# Patient Record
Sex: Female | Born: 1940 | Race: White | Hispanic: No | Smoking: Never smoker
Health system: Southern US, Community
[De-identification: ages and names within clinical notes are randomized; demographics above are authoritative.]

## PROBLEM LIST (undated history)

## (undated) DIAGNOSIS — R001 Bradycardia, unspecified: Secondary | ICD-10-CM

## (undated) DIAGNOSIS — E039 Hypothyroidism, unspecified: Secondary | ICD-10-CM

## (undated) DIAGNOSIS — E7800 Pure hypercholesterolemia, unspecified: Secondary | ICD-10-CM

## (undated) DIAGNOSIS — I639 Cerebral infarction, unspecified: Secondary | ICD-10-CM

## (undated) DIAGNOSIS — Z9889 Other specified postprocedural states: Secondary | ICD-10-CM

## (undated) DIAGNOSIS — K635 Polyp of colon: Secondary | ICD-10-CM

## (undated) DIAGNOSIS — M1990 Unspecified osteoarthritis, unspecified site: Secondary | ICD-10-CM

## (undated) DIAGNOSIS — N189 Chronic kidney disease, unspecified: Secondary | ICD-10-CM

## (undated) DIAGNOSIS — F32A Depression, unspecified: Secondary | ICD-10-CM

## (undated) DIAGNOSIS — I4891 Unspecified atrial fibrillation: Secondary | ICD-10-CM

## (undated) DIAGNOSIS — I10 Essential (primary) hypertension: Secondary | ICD-10-CM

## (undated) DIAGNOSIS — E119 Type 2 diabetes mellitus without complications: Secondary | ICD-10-CM

## (undated) DIAGNOSIS — F329 Major depressive disorder, single episode, unspecified: Secondary | ICD-10-CM

## (undated) DIAGNOSIS — K859 Acute pancreatitis without necrosis or infection, unspecified: Secondary | ICD-10-CM

## (undated) DIAGNOSIS — K219 Gastro-esophageal reflux disease without esophagitis: Secondary | ICD-10-CM

## (undated) HISTORY — DX: Acute pancreatitis without necrosis or infection, unspecified: K85.90

## (undated) HISTORY — DX: Cerebral infarction, unspecified: I63.9

## (undated) HISTORY — DX: Unspecified atrial fibrillation: I48.91

## (undated) HISTORY — DX: Pure hypercholesterolemia, unspecified: E78.00

## (undated) HISTORY — DX: Hypothyroidism, unspecified: E03.9

## (undated) HISTORY — DX: Essential (primary) hypertension: I10

## (undated) HISTORY — DX: Type 2 diabetes mellitus without complications: E11.9

## (undated) HISTORY — PX: SKIN CANCER EXCISION: SHX779

## (undated) HISTORY — PX: CHOLECYSTECTOMY: SHX55

## (undated) HISTORY — DX: Other specified postprocedural states: Z98.890

## (undated) HISTORY — DX: Gastro-esophageal reflux disease without esophagitis: K21.9

---

## 1999-10-01 ENCOUNTER — Encounter: Payer: Self-pay | Admitting: Family Medicine

## 1999-10-01 ENCOUNTER — Encounter: Admission: RE | Admit: 1999-10-01 | Discharge: 1999-10-01 | Payer: Self-pay | Admitting: Family Medicine

## 1999-10-07 ENCOUNTER — Encounter: Payer: Self-pay | Admitting: Family Medicine

## 1999-10-07 ENCOUNTER — Encounter: Admission: RE | Admit: 1999-10-07 | Discharge: 1999-10-07 | Payer: Self-pay | Admitting: Family Medicine

## 2000-02-12 ENCOUNTER — Encounter: Admission: RE | Admit: 2000-02-12 | Discharge: 2000-02-12 | Payer: Self-pay | Admitting: Family Medicine

## 2000-02-12 ENCOUNTER — Encounter: Payer: Self-pay | Admitting: Family Medicine

## 2004-07-16 ENCOUNTER — Inpatient Hospital Stay (HOSPITAL_COMMUNITY): Admission: EM | Admit: 2004-07-16 | Discharge: 2004-07-27 | Payer: Self-pay | Admitting: Emergency Medicine

## 2004-07-23 DIAGNOSIS — Z9889 Other specified postprocedural states: Secondary | ICD-10-CM

## 2004-07-23 HISTORY — DX: Other specified postprocedural states: Z98.890

## 2004-07-24 ENCOUNTER — Encounter (INDEPENDENT_AMBULATORY_CARE_PROVIDER_SITE_OTHER): Payer: Self-pay | Admitting: *Deleted

## 2004-07-24 ENCOUNTER — Ambulatory Visit: Payer: Self-pay | Admitting: Gastroenterology

## 2005-04-03 ENCOUNTER — Emergency Department (HOSPITAL_COMMUNITY): Admission: EM | Admit: 2005-04-03 | Discharge: 2005-04-03 | Payer: Self-pay | Admitting: Emergency Medicine

## 2006-05-25 HISTORY — PX: SHOULDER SURGERY: SHX246

## 2006-06-17 ENCOUNTER — Ambulatory Visit (HOSPITAL_COMMUNITY): Admission: RE | Admit: 2006-06-17 | Discharge: 2006-06-17 | Payer: Self-pay | Admitting: Family Medicine

## 2007-01-09 ENCOUNTER — Emergency Department (HOSPITAL_COMMUNITY): Admission: EM | Admit: 2007-01-09 | Discharge: 2007-01-09 | Payer: Self-pay | Admitting: Emergency Medicine

## 2007-04-25 DIAGNOSIS — I2510 Atherosclerotic heart disease of native coronary artery without angina pectoris: Secondary | ICD-10-CM | POA: Insufficient documentation

## 2007-04-25 DIAGNOSIS — E785 Hyperlipidemia, unspecified: Secondary | ICD-10-CM | POA: Insufficient documentation

## 2007-04-25 DIAGNOSIS — E039 Hypothyroidism, unspecified: Secondary | ICD-10-CM | POA: Insufficient documentation

## 2007-04-25 DIAGNOSIS — E119 Type 2 diabetes mellitus without complications: Secondary | ICD-10-CM | POA: Insufficient documentation

## 2007-04-25 DIAGNOSIS — F329 Major depressive disorder, single episode, unspecified: Secondary | ICD-10-CM

## 2007-04-25 DIAGNOSIS — I6789 Other cerebrovascular disease: Secondary | ICD-10-CM | POA: Insufficient documentation

## 2007-04-25 DIAGNOSIS — I4891 Unspecified atrial fibrillation: Secondary | ICD-10-CM | POA: Insufficient documentation

## 2007-04-25 DIAGNOSIS — I10 Essential (primary) hypertension: Secondary | ICD-10-CM | POA: Insufficient documentation

## 2007-04-25 DIAGNOSIS — F32A Depression, unspecified: Secondary | ICD-10-CM | POA: Insufficient documentation

## 2007-07-24 DIAGNOSIS — Z9889 Other specified postprocedural states: Secondary | ICD-10-CM

## 2007-07-24 HISTORY — DX: Other specified postprocedural states: Z98.890

## 2007-08-07 ENCOUNTER — Ambulatory Visit: Payer: Self-pay | Admitting: Cardiology

## 2007-08-07 ENCOUNTER — Inpatient Hospital Stay (HOSPITAL_COMMUNITY): Admission: EM | Admit: 2007-08-07 | Discharge: 2007-08-17 | Payer: Self-pay | Admitting: Emergency Medicine

## 2007-08-08 ENCOUNTER — Ambulatory Visit: Payer: Self-pay | Admitting: Internal Medicine

## 2007-08-09 ENCOUNTER — Ambulatory Visit: Payer: Self-pay | Admitting: Internal Medicine

## 2007-08-09 ENCOUNTER — Encounter (INDEPENDENT_AMBULATORY_CARE_PROVIDER_SITE_OTHER): Payer: Self-pay | Admitting: Internal Medicine

## 2007-08-10 ENCOUNTER — Ambulatory Visit: Payer: Self-pay | Admitting: Gastroenterology

## 2007-08-15 ENCOUNTER — Ambulatory Visit: Payer: Self-pay | Admitting: Gastroenterology

## 2007-08-15 ENCOUNTER — Encounter: Payer: Self-pay | Admitting: Gastroenterology

## 2007-08-16 ENCOUNTER — Ambulatory Visit: Payer: Self-pay | Admitting: Internal Medicine

## 2007-08-19 ENCOUNTER — Ambulatory Visit: Payer: Self-pay | Admitting: Cardiology

## 2007-08-26 ENCOUNTER — Ambulatory Visit: Payer: Self-pay | Admitting: Cardiology

## 2007-09-09 ENCOUNTER — Ambulatory Visit: Payer: Self-pay | Admitting: Cardiology

## 2007-10-10 ENCOUNTER — Ambulatory Visit: Payer: Self-pay | Admitting: Internal Medicine

## 2007-10-20 ENCOUNTER — Ambulatory Visit: Payer: Self-pay | Admitting: Internal Medicine

## 2007-10-25 ENCOUNTER — Ambulatory Visit: Payer: Self-pay | Admitting: Cardiology

## 2007-11-29 ENCOUNTER — Ambulatory Visit: Payer: Self-pay | Admitting: Gastroenterology

## 2008-01-25 ENCOUNTER — Ambulatory Visit (HOSPITAL_COMMUNITY): Admission: RE | Admit: 2008-01-25 | Discharge: 2008-01-25 | Payer: Self-pay | Admitting: Internal Medicine

## 2008-01-26 ENCOUNTER — Ambulatory Visit: Payer: Self-pay | Admitting: Cardiology

## 2008-09-07 ENCOUNTER — Ambulatory Visit (HOSPITAL_COMMUNITY): Admission: RE | Admit: 2008-09-07 | Discharge: 2008-09-07 | Payer: Self-pay | Admitting: Internal Medicine

## 2009-04-05 ENCOUNTER — Ambulatory Visit (HOSPITAL_COMMUNITY): Admission: RE | Admit: 2009-04-05 | Discharge: 2009-04-05 | Payer: Self-pay | Admitting: Internal Medicine

## 2009-08-15 ENCOUNTER — Encounter (INDEPENDENT_AMBULATORY_CARE_PROVIDER_SITE_OTHER): Payer: Self-pay

## 2010-04-15 ENCOUNTER — Ambulatory Visit (HOSPITAL_COMMUNITY): Admission: RE | Admit: 2010-04-15 | Discharge: 2010-04-15 | Payer: Self-pay | Admitting: Internal Medicine

## 2010-06-15 ENCOUNTER — Encounter: Payer: Self-pay | Admitting: Family Medicine

## 2010-06-26 NOTE — Letter (Signed)
Summary: Recall, Screening Colonoscopy Only  [facility]  [address]   [place], [place] [address]   Phone: [phone]  Fax: [phone]    August 15, 2009  FELICIDAD HOGELAND [address] [place], [place]  [address] 02/25/1941   Dear Ms. Dowse,   Our records indicate it is time to schedule your colonoscopy.    Please call our office at [phone] and ask for the nurse.   Thank you,  Burnadette Peter, LPN Waldon Merl, [facility] Ph: [phone]   Fax: [phone]

## 2010-08-21 ENCOUNTER — Emergency Department (HOSPITAL_COMMUNITY)
Admission: EM | Admit: 2010-08-21 | Discharge: 2010-08-22 | Disposition: A | Discharge status: Home / Self Care | Payer: MEDICARE | Attending: Emergency Medicine | Admitting: Emergency Medicine

## 2010-08-21 ENCOUNTER — Emergency Department (HOSPITAL_COMMUNITY): Payer: MEDICARE

## 2010-08-21 DIAGNOSIS — R05 Cough: Secondary | ICD-10-CM | POA: Insufficient documentation

## 2010-08-21 DIAGNOSIS — R509 Fever, unspecified: Secondary | ICD-10-CM | POA: Insufficient documentation

## 2010-08-21 DIAGNOSIS — B9789 Other viral agents as the cause of diseases classified elsewhere: Secondary | ICD-10-CM | POA: Insufficient documentation

## 2010-08-21 DIAGNOSIS — R059 Cough, unspecified: Secondary | ICD-10-CM | POA: Insufficient documentation

## 2010-08-21 DIAGNOSIS — IMO0001 Reserved for inherently not codable concepts without codable children: Secondary | ICD-10-CM | POA: Insufficient documentation

## 2010-08-22 LAB — DIFFERENTIAL
Basophils Absolute: 0 10*3/uL (ref 0.0–0.1)
Basophils Relative: 0 % (ref 0–1)
Lymphocytes Relative: 10 % — ABNORMAL LOW (ref 12–46)
Neutro Abs: 6.1 10*3/uL (ref 1.7–7.7)
Neutrophils Relative %: 80 % — ABNORMAL HIGH (ref 43–77)

## 2010-08-22 LAB — URINALYSIS, ROUTINE W REFLEX MICROSCOPIC
Bilirubin Urine: NEGATIVE
Glucose, UA: NEGATIVE mg/dL
Protein, ur: NEGATIVE mg/dL
Specific Gravity, Urine: 1.01 (ref 1.005–1.030)

## 2010-08-22 LAB — CBC
HCT: 36.1 % (ref 36.0–46.0)
Hemoglobin: 11.7 g/dL — ABNORMAL LOW (ref 12.0–15.0)
RBC: 3.83 MIL/uL — ABNORMAL LOW (ref 3.87–5.11)
WBC: 7.7 10*3/uL (ref 4.0–10.5)

## 2010-08-22 LAB — BASIC METABOLIC PANEL
CO2: 26 mEq/L (ref 19–32)
Calcium: 9.1 mg/dL (ref 8.4–10.5)
Chloride: 102 mEq/L (ref 96–112)
GFR calc Af Amer: 36 mL/min — ABNORMAL LOW (ref >60)
Potassium: 4 mEq/L (ref 3.5–5.1)
Sodium: 138 mEq/L (ref 135–145)

## 2010-08-22 LAB — URINE MICROSCOPIC-ADD ON

## 2010-10-07 NOTE — Consult Note (Signed)
[name], [name]               ACCOUNT NO.:  [id number]   MEDICAL RECORD NO.:  [id number]          PATIENT TYPE:  INP   LOCATION:  A218                          FACILITY:  [facility]   PHYSICIAN:  R. Garfield Cornea, M.D. DATE OF BIRTH:  1941-05-03   DATE OF CONSULTATION:  08/08/2007  DATE OF DISCHARGE:                                 CONSULTATION   REASON FOR CONSULTATION:  Acute pancreatitis.   HISTORY OF PRESENT ILLNESS:  [name] is a 70 year old female with a  second episode of acute pancreatitis.  She has a 10-day history of  severe upper abdominal pain which she describes as a band-like aching to  her upper abdomen.  She rates the pain as a 9/10 on the pain scale.  This pain is worsened every time she eats.  She is having vomiting, as  well, postprandially.  She was admitted to the hospital with a lipase of  4020.  She also had a white blood cell count of 13,700.  She denies any  history of jaundice, pruritus or fever.  She has had some anorexia.  Her  weight has remained stable.  She does note flu-like symptoms prior to  admission including abdominal pain, fatigue and cough with green sputum.  She has a history of hyperlipidemia and triglyceridemia.  Has been on  Zocor for about 2 years now.  She is noted to have acute renal  insufficiency with a creatinine of 2.06 today.   PAST MEDICAL AND SURGICAL HISTORY:  1. She was admitted to [facility] in 2006 with similar symptoms and      found to have acute pancreatitis.  No evidence of gallstones on      ultrasound.  2. Paroxysmal atrial fibrillation and has been on chronic Coumadin.  3. Type 2 diabetes mellitus.  4. Depression.  5. History of CVA with left-sided deficits in 2004.  6. Hypertension.  7. She had a cardiac catheterization in 1995 which she thinks was      normal.  She had left shoulder surgery in August of 2008.   MEDICATIONS PRIOR TO ADMISSION:  1. Prilosec 40 mg daily.  2. Lasix 40 mg daily.  3. Benazepril 40  grams daily.  4. Levothyroxine 75 mg mcg daily.  5. Glyburide 2.5 mg daily.  6. Metformin 500 mg b.i.d.  7. Celexa 20 mg daily.  8. Warfarin 5 mg daily.  9. Flecainide 100 mg b.i.d.   ALLERGIES:  1. FIORINAL.  2. PENICILLIN.  3. TETANUS.   FAMILY HISTORY:  Positive for a maternal uncle with pancreatic  carcinoma.  One son with diabetes.  One brother deceased secondary to  alcoholic cirrhosis.  Mother deceased at 27 secondary to lung cancer.  Father deceased at 7 secondary to MI.  She has two sisters, one with  rheumatoid arthritis, one with MI.   SOCIAL HISTORY:  [name] denies any tobacco, alcohol or drug use.  She is disabled.  She was previously a Secondary school teacher.  She is single.  She has one grown son.   REVIEW OF SYSTEMS:  See HPI; otherwise negative.  PHYSICAL EXAMINATION:  VITAL SIGNS:  Weight 97.5 pounds, height 57 kg,  temperature 98.4, pulse 65, respirations 20, blood pressure 90/35, O2  saturation 92% on 2 liters via nasal cannula.  GENERAL:  [name] is an obese Caucasian female who is alert,  oriented, pleasant and cooperative, in no acute distress.  HEENT:  Sclerae are clear, nonicteric.  Conjunctivae pink.  Oropharynx  pink and moist without any lesions.  NECK:  Supple without any mass or thyromegaly.  CHEST:  Heart regular rate and rhythm.  Normal S1-S2 without murmurs,  clicks, rubs or gallops.  LUNGS:  Clear to auscultation bilaterally.  ABDOMEN:  Positive bowel sounds x4.  Abdomen is mildly distended.  She  does have significant tenderness to her upper abdomen bilaterally.  She  does have rebound tenderness.  EXTREMITIES:  Without clubbing or edema bilaterally.   LABORATORY STUDIES:  Hemoglobin 12, hematocrit 35, WBCs 12.5, platelets  205.  PT 27.5, INR 2.4, total bilirubin 2.6, alkaline phosphatase 400,  AST 1558,  ALT 1183, total protein 6.7, albumin 3.4, amylase 126,  calcium 9, sodium 145, potassium 4.8, chloride 107, CO2 of 30, BUN 35,   creatinine 2.06 and glucose 85.   IMPRESSION:  [name] is a 70 year old female with 10 days of  abdominal pain and vomiting with elevated amylase and lipase and acute  pancreatitis.  This is [name]'s second episode.  Previous workup in  2006 was negative for gallstones.  She does have a history of  hypertriglyceridemia.  The etiology of her pancreatitis could include  gallstones versus hypertriglyceridemia versus malignancy.   PLAN:  1. NPO.  2. Abdominal ultrasound today.  3. Cannot have pancreatic protocol CT secondary to renal      insufficiency.  4. If no evidence of gallstones, will need EUS once the pancreas cools      off.  5. Await lipid panel.  6. Would hold antibiotics for now.  7. Agree with pain control and antiemetics.  8. LFTs and MET-7 in the morning.  9. May need TPN if she does not turn around quickly for nutrition.  10.Increase IV fluids to 175 cc per hour for 8 hours, then down to 150      cc per hour.   Thank you, [facility], for allowing [place] to participate in the care  of [name].      Vickey Huger, N.P.      Bridgette Habermann, M.D.  Electronically Signed    KJ/MEDQ  D:  08/08/2007  T:  08/08/2007  Job:  [id number]

## 2010-10-07 NOTE — Consult Note (Signed)
[name], HAMMOCK               ACCOUNT NO.:  [id number]   MEDICAL RECORD NO.:  [id number]          PATIENT TYPE:  INP   LOCATION:  A218                          FACILITY:  [facility]   PHYSICIAN:  Alison Murray, M.D.DATE OF BIRTH:  08/18/1940   DATE OF CONSULTATION:  08/10/2007  DATE OF DISCHARGE:                                 CONSULTATION   REASON FOR CONSULTATION:  Elevated BUN and creatinine.   HISTORY OF PRESENT ILLNESS:  [name] is a 70 year old Caucasian  female who has a past medical history of atrial fibrillation, a CVA,  diabetes mellitus, who came in with abdominal pain and she was found to  have elevated BUN and creatinine, elevated liver function tests,  secondary to pancreatitis and cholecystitis.  Presently the patient does  still have abdominal pain, but it seems to be better.  The patient  denies any previous history of renal failure.  No history of diabetic  retinopathy.  No history of proteinuria.   PAST MEDICAL HISTORY:  1. History of a CVA.  2. History of atrial fibrillation.  3. History of diabetes.  4. History of hypertension.  5. History of hypothyroidism.  6. History of hyperlipidemia.  7. History of pyelonephritis.  8. Present history of pancreatitis and cholecystitis.   SOCIAL HISTORY:  There is no history of smoking or history of alcohol  abuse.   CURRENT MEDICATIONS:  1. Celexa 20 mg p.o. daily.  2. IV fluids at 50 mL per hour.  3. Insulin.  4. Synthroid 75 mcg p.o. daily.  5. Protonix 40 mg IV daily.  6. Azor 40 mg p.o. daily.  7. Ventolin inhaler.  8. She is getting Dilaudid p.r.n.  9. Zofran p.r.n.   ALLERGIES:  PENICILLIN AND DPT.   FAMILY HISTORY:  Her mother with history of lung CA and father had a  history of __________.  No history of kidney problems.   REVIEW OF SYSTEMS:  This woman seems to be feeling better.  She does not  have any nausea or vomiting or abdominal pain.  Also eating better.  She  denies any shortness of  breath or lightheadedness.  She denies any  urgency or frequency.   PHYSICAL EXAMINATION:  VITAL SIGNS:  Temperature 98.4 degrees, blood  pressure 135/51.  HEENT:  No conjunctival pallor.  NECK:  No neck swelling.  CHEST:  Decreased breath sounds, otherwise clear.  No rales, no rhonchi,  no egophony.  HEART:  __rrr________  normal.  ABDOMEN:  Soft, positive bowel sounds.  EXTREMITIES:  No edema.   LABORATORY DATA:  White blood cell count 11.1, hematocrit 28.1,  hemoglobin 9.9, platelets 140.  Sodium 136, potassium 3.9, BUN 10.7,  creatinine 1.7.  Yesterday the BUN was 37 and creatinine 2.6.  Her liver  function tests:  Alkaline phosphatase 285, improving from 400, SGOT 155,  improving from 500 and __________ is 2.8.  Her lipase was 1640.  CPK  __________ .   She had an ultrasound done which shows the left kidney to be 9.3, right  kidney 9.3.  There is no hydronephrosis.   ASSESSMENT:  1. Renal insufficiency:  This woman seems to be acute, possibly      secondary to acute tubular necrosis, associated with cholecystitis      and pancreatitis.  At this moment pre-renal syndrome was to be      entertained.  Since there is no other blood work, it has been very      difficult to see whether the patient has underlying chronic renal      failure associated with her diabetes.  Presently her renal function      seems to be improving.  2. History of right cerebrovascular accident.  3. History of atrial fibrillation, controlled:  She is on Coumadin.  4. History of pancreatitis and cholecystitis.  5. History of diabetes:  She is on insulin.  6. History of hypertension.  7. History of hypothyroidism:  She is on Synthroid.  8. History of hyperlipidemia.   RECOMMENDATIONS:  Will continue to hydrate the patient and I will do a  urinalysis in 24 hours for protein and creatinine clearance.  Will  continue her other treatment.  As her creatinine seems to be at this  moment improving, will keep  her with the same __________ .  If the  patient has proteinuria, probably will do further workup.      Alison Murray, M.D.  Electronically Signed     BB/MEDQ  D:  08/10/2007  T:  08/10/2007  Job:  [id number]

## 2010-10-07 NOTE — Assessment & Plan Note (Signed)
[name] CARDIOLOGY OFFICE NOTE   [name], [name]                      MRN:          [id number]  DATE:10/25/2007                            DOB:          1940-11-15    CARDIOLOGIST:  [name].   PRIMARY CARE PHYSICIAN:  She is new to Dr. Delphina [name].   REASON FOR VISIT:  Post hospitalization followup.   HISTORY OF PRESENT ILLNESS:  [name] is a very pleasant 70 year old  female patient with a history of right-brain CVA in the setting of  atrial fibrillation in 2004.  She is maintained on flecainide and  Coumadin therapy.  She had previously been followed by Dr. Selena [name] in  [place].  She was admitted to [facility] in early March 2009  with abdominal pain.  She was diagnosed with biliary pancreatitis and  cholecystitis and possible pancreatic pseudocyst.  She developed acute  renal failure as well as anemia.  She apparently had an EGD performed  during her hospitalization that showed some reactive gastropathy.  H.  pylori was negative.  She also apparently had a recent colonoscopy and  it was not felt to be necessary to do a repeat colonoscopy.  She was  transfused with packed red blood cells.  Her acute renal failure  improved with hydration.  She eventually went for laparoscopic  cholecystectomy.  We were involved in her case secondary to a history of  atrial fibrillation.  She remained in sinus rhythm throughout.  She was  monitored on telemetry and had no recurrences of atrial fibrillation.  Coumadin was held throughout the perioperative period and was eventually  restarted postoperatively.  Since discharge in the hospital she has been  seen back in our Coumadin clinic and her INRs have been therapeutic.   She notes that she is doing well.  She denies any palpitations, chest  pain, shortness of breath, syncope or near syncope.  She denies  orthopnea, PND or pedal edema.   CURRENT  MEDICATIONS:  1. Flecainide 100 mg b.i.d.  2. Simvastatin 40 mg nightly.  3. Furosemide 40 mg daily.  4. Benazepril 40 mg daily.  5. Levothyroxine 0.075 mg daily.  6. Glyburide 2.5 mg daily.  7. Metformin 500 mg daily.  8. Warfarin as directed.  9. Celexa 20 mg daily.   ALLERGIES:  TETANUS, PENICILLIN, and FIORINAL.   PHYSICAL EXAMINATION:  She is a well-nourished, well-developed female in  no distress.  Blood pressure 93/59, pulse 64, weight 207 pounds.  HEENT:  Normal.  NECK:  Without JVD.  CARDIAC:  S1, S2, regular rate and rhythm.  LUNGS:  Clear to auscultation bilaterally.  ABDOMEN:  Soft, nontender.  EXTREMITIES:  Without edema.  NEUROLOGIC:  She is alert and oriented x3.  Cranial nerves II-XII are  grossly intact.   IMPRESSION:  1. Persistent atrial fibrillation.      a.     Maintaining sinus rhythm on flecainide therapy.      b.     Coumadin therapy.  2. Status post right-brain cerebrovascular accident in the setting of  atrial fibrillation, Coumadin therapy.  3. Normal exercise Cardiolite, May of 2004, negative for ischemia.  4. Good left ventricular function.      a.     Ejection fraction 50% by echocardiogram in 2004 and EF 61%       by nuclear imaging in 2004.  5. Diabetes mellitus.  6. Hypertension.  7. Treated hypothyroidism.  8. Gastroesophageal reflux disease.  9. Treated dyslipidemia.  10.Recent history of gallstone pancreatitis, status post      cholecystectomy      a.     Course complicated by anemia and acute renal failure.   PLAN:  1. The patient is doing well from a post hospitalization standpoint.      Clinically she remains in sinus rhythm.  Her medications will be      left unchanged today.  2. She is establishing with Dr. Delphina [name].  She has blood work pending      with him.  It sounds as though she is having a comprehensive blood      work panel being performed.  She also has followup liver function      tests that have been ordered by  gastroenterology.  We will try to      obtain results of those lab tests.  3. The patient will be brought back in routine followup with Dr.      Lattie Haw in the next 3 months.      She will continue routine followup with our Coumadin clinic for      management of her Coumadin.      Richardson Dopp, PA-C  Electronically Signed      Cristopher Estimable. Lattie Haw, MD, [facility]  Electronically Signed   SW/MedQ  DD: 10/25/2007  DT: 10/25/2007  Job #: [id number]   cc:   [name], M.D.  Bridgette Habermann, M.D.

## 2010-10-07 NOTE — Consult Note (Signed)
NAMEJAHARA, CLUCAS NO.:  [id number]   MEDICAL RECORD NO.:  [id number]          PATIENT TYPE:  INP   LOCATION:  A218                          FACILITY:  [facility]   PHYSICIAN:  Cristopher Estimable. Lattie Haw, MD, FACCDATE OF BIRTH:  12-07-1940   DATE OF CONSULTATION:  08/09/2007  DATE OF DISCHARGE:                                 CONSULTATION   REFERRING PHYSICIAN:  Bonnielee Haff, MD, of [facility].   PRIMARY CARE PHYSICIAN:  Jama Flavors. Redmond Pulling, M.D., of [facility].   CARDIOLOGIST:  Dr. William Hamburger in [place], [place].   REASON FOR CONSULTATION:  Atrial fibrillation.   HISTORY OF PRESENT ILLNESS:  Ms. Quintrell is a 70 year old female patient  with a history of right brain CVA in the setting of atrial fibrillation  in 2004, who is maintaining sinus rhythm on flecainide therapy and  anticoagulated with Coumadin, who presented to [facility] August 07, 2007, with progressively worsening abdominal pain over two weeks.  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  She is currently being managed by  gastroenterology and general surgery.  Coumadin has been held by the  primary service and she has been given one dose of vitamin K 2.5 mg p.o.  We are asked to further assist with anticoagulation measures.  Of note,  her INR today is 3.2.  The patient notes that she has had no recurrence  of atrial fibrillation since 2004.  She apparently had some adjustments  in her flecainide at one point.  She has had no episodes of palpitations  recently.  She use to be quite active prior to her stroke.  She  continues to try to exercise since her stroke.  She does note some  dyspnea with exertion but she describes NYHA class I to II symptoms.  She does report some chest discomfort with over-exertion.  This has been  present for many years without any change.  She denies any associated  nausea, diaphoresis or radiating symptoms.  She denies any history of  syncope or near syncope.  There are no reports of orthopnea, PND or  pedal edema.   PAST MEDICAL HISTORY:  1. A fibrillation (paroxysmal versus persistent).      a.     Coumadin therapy since 2004.      b.     Flecainide therapy since 2004.  2. Apparent stress test in 2004, normal per the patient.  3. Status post right brain CVA in the setting of atrial fibrillation.      a.     Residual left-sided weakness.  4. Diabetes mellitus.  5. Hypertension.  6. Hypothyroidism, treated.  7. GERD.  8. Hyperlipidemia, treated.  9. History of gallstone pancreatitis in 2006, [facility] at [facility].  10.History of pyelonephritis in 2006.  11.Status post left shoulder surgery at [facility] in 2008 with suboptimal results.   MEDICATIONS PRIOR TO ADMISSION:  1. Prilosec 20 mg daily.  2.  Furosemide 80 mg half tablet daily.  3. Benazepril 40 mg daily.  4. Levothyroxine 0.075 mg daily.  5. Glyburide 5 mg half tablet daily.  6. Metformin 1000 mg half tablet daily.  7. Celexa 20 mg daily.  8. Warfarin 5 mg daily except for 2.5 mg on Wednesday.  9. Flecainide 100 mg b.i.d.  10.Simvastatin 40 mg nightly.   ALLERGIES:  FIORINAL, PENICILLIN and TETANUS.   SOCIAL HISTORY:  The patient lives in [place], [place], with  her husband.  She is retired from Secondary school teacher.  She denies  tobacco or alcohol abuse.  Of note, she travels to [place], [place]  every six months to see Dr. Selena Batten.  He does manage her Coumadin  remotely.   FAMILY HISTORY:  Significant for lung cancer in her mother, her father  had bypass surgery in his 80s.   REVIEW OF SYSTEMS:  Please see HPI.  Denies any fevers, chills,  headache, rashes.  Denies dysuria or hematuria.  Denies any bright red  blood per rectum or melena.  Denies dysphagia or odynophagia.  She  denies any recurrent symptoms of  stroke.  She denies any claudication.  Rest of the review of systems are negative.   PHYSICAL EXAMINATION:  GENERAL APPEARANCE:  She is a well-nourished,  well-developed female in no acute distress.  VITAL SIGNS:  Blood pressure 101/49, pulse 80, respirations 15,  temperature 101.3, oxygen saturation 92% on 2 liters, weight 97.5 kg.  HEENT:  Normal.  NECK:  Without JVD.  LYMPH:  Without lymphadenopathy.  ENDOCRINE:  Without thyromegaly.  CARDIAC:  Normal S1 and S2, regular rate and rhythm without murmur.  LUNGS:  Clear to auscultation bilaterally.  ABDOMEN:  Decreased bowel sounds, soft.  EXTREMITIES:  Without clubbing, cyanosis or edema.  MUSCULOSKELETAL:  No joint deformity.  NEUROLOGIC:  She is alert and oriented x3.  Cranial nerves II-XII  grossly intact.  VASCULAR:  I cannot appreciate any carotid artery bruits bilaterally.   Chest x-ray reveals cardiac enlargement without failure, low lung  volumes with left basilar atelectasis.   EKG reveals normal sinus rhythm with heart rate of 72, normal axis, no  acute changes.  PR interval 220 milliseconds, first degree AV block, QTC  prolonged 523 milliseconds.   LABORATORY DATA:  White count 13,000, hemoglobin 10.4, hematocrit 30.2,  platelet count 166,000.  Sodium 136, potassium 4.2, BUN 37, creatinine  2.62 which is up from 2.06, glucose 139.  Alkaline phosphatase 352, AST  455, ALT 640, TSH 0.389, albumin 2.8. Lipase 1645.  INR this morning  3.2.  Total cholesterol 130, triglycerides 45, HDL 64 LDL 57.   IMPRESSION:  1. Paroxysmal versus persistent atrial fibrillation.      a.     Maintaining normal sinus rhythm on flecainide therapy.      b.     Coumadin therapy.  2. History of right brain cerebrovascular accident in 2004 the setting      of atrial fibrillation.      a.     Coumadin therapy.      b.     The patient reports having a right internal carotid artery       stenosis after undergoing a carotid angiogram - apparently  no       follow-up necessary.  3. Biliary pancreatitis  4. Acute renal failure.  5. Diabetes mellitus.  6. Hypertension.  7. Hyperlipidemia.  8. Hypothyroidism.   PLAN:  The patient was also examined by Dr. Lattie Haw.  She currently has  her Coumadin held.  She is currently n.p.o..  She had been given vitamin  K but her INR is still supratherapeutic.  She has had no clinical  recurrence of atrial fibrillation since her stroke.  She should be okay  to be off of anticoagulation at this point as she is being monitored.  She does have multiple cardiovascular risk factors, but relatively good  performance status.  She will not need any stress perfusion studies  prior to her laparoscopic cholecystectomy.  We will try to obtain  records from Dr. Selena Batten in [place].  Thank you very much for the  consultation.  We will be happy to follow the patient throughout the  remainder this admission.      Richardson Dopp, PA-C      Cristopher Estimable. Lattie Haw, MD, [facility]  Electronically Signed    SW/MEDQ  D:  08/09/2007  T:  08/09/2007  Job:  [id number]   cc:   Jama Flavors. Redmond Pulling, M.D.  Fax: [phone]   William Hamburger, M.D.  [place], [place]

## 2010-10-07 NOTE — Discharge Summary (Signed)
NAMEBRITTHANY, WOLAVER NO.:  [id number]   MEDICAL RECORD NO.:  [id number]          PATIENT TYPE:  INP   LOCATION:  A218                          FACILITY:  [facility]   PHYSICIAN:  Salem Caster, DO    DATE OF BIRTH:  08/17/1940   DATE OF ADMISSION:  08/07/2007  DATE OF DISCHARGE:  LH                               DISCHARGE SUMMARY   ADDENDUM:   DISCHARGE MEDICATIONS:  Lortab 5/500 mg, one to two tab q.4-6h. p.r.n.      Salem Caster, DO  Electronically Signed     SM/MEDQ  D:  08/16/2007  T:  08/16/2007  Job:  [id number]

## 2010-10-07 NOTE — Group Therapy Note (Signed)
[name], [name] NO.:  [id number]   MEDICAL RECORD NO.:  [id number]          PATIENT TYPE:  INP   LOCATION:  A218                          FACILITY:  [facility]   PHYSICIAN:  Salem Caster, DO    DATE OF BIRTH:  Oct 16, 1940   DATE OF PROCEDURE:  08/11/2007  DATE OF DISCHARGE:                                 PROGRESS NOTE   SUBJECTIVE:  The patient states the pain in her abdomen is improving.  No nausea, vomiting currently.  The patient does not mention any lower  extremity pain at this time.  Overall she seems improved.   OBJECTIVE:  VITAL SIGNS:  Temperature is 97,  pulse 61, respirations 12,  blood pressure 157/75, oxygen saturation 98% on 2 L.  CARDIOVASCULAR:  Normal.  Regular rate and rhythm.  No rubs, gallops or murmurs.  LUNGS:  Clear to auscultation bilaterally.  No rales, rhonchi or  wheezing.  ABDOMEN:  Soft.  She still has some epigastric tenderness on palpation.  Positive bowel sounds.  No rigidity or guarding   LABORATORY DATA:  Phosphorus 3.3, lipase 49, sodium 138, potassium 3.5,  chloride 104, CO2 29, glucose 167, BUN 19, creatinine 1.34.  PT 16.6,  INR 1.3.  White count 9.1, hemoglobin 9.5, hematocrit 27.6, platelet  count 131.   ASSESSMENT/PLAN:  1. Acute pancreatitis.  She seems to clinically be improving.  Her      abdominal pain is slightly improving daily.  The patient's diet has      been increased to clear liquids  2. Acute cholecystitis.  She did have gallstones on an ultrasound.      The patient will probably need cholecystectomy  in the future.  3. Hypoglycemia has resolved.  Patient is on a sliding scale.  Will      continue this and follow her blood sugars.  4. Acute renal failure.  The patient has been followed by nephrology      and seems to be improving.  Will continue current treatment at this      time.  5. History of paroxysmal atrial fibrillation.  Continue to follow her      PT and INR.  6. Anemia. Seems to be stable.   Could be dilutional.  No obvious signs      of bleeding at this time.  7. The patient is being followed by cardiology.  Flecainide has been      restarted and is being followed by cardiology at this time.      Salem Caster, DO  Electronically Signed     SM/MEDQ  D:  08/11/2007  T:  08/11/2007  Job:  [id number]

## 2010-10-07 NOTE — Assessment & Plan Note (Signed)
[name], [name]                CHART#:  [id number]   DATE:  10/20/2007                       DOB:  07-20-1940   CHIEF COMPLAINT:  Followup of hospitalization for pancreatitis.   SUBJECTIVE:  The patient is a very pleasant 70 year old lady who was in  the hospital in March with acute pancreatitis felt to be biliary in  etiology.  She underwent a cholecystectomy by Dr. Aviva Signs on August 15, 2007.  She thinks it took her about a month to fully recover.  For  the past one month she has felt great.  She has had no abdominal pain.  Her appetite has been good.  No heartburn, nausea, vomiting,  constipation, diarrhea, melena, or rectal bleeding.  She states she has  transferred her care to Dr. Delphina Cahill here in [place]. She also will  be following with Dr. Jacqulyn Ducking regarding her cardiology issues.  During the hospitalization, she did have anemia as well as elevated  LFTs.  She did not have her blood work done last month that we  requested.  She did have an EGD during her hospitalization which  revealed multiple 3-4 mm sessile gastric polyps which were benign,  fundic gland polyps.  She had a single antral erosion with deformities  of the pylorus consistent with a prior peptic ulcer disease.  Biopsies  were negative for H pylori.  She had reactive gastropathy, possibly due  to NSAIDs.  She did have gallstones noted at the time of pathology.   CURRENT MEDICATIONS:  See updated list.   ALLERGIES:  PENICILLIN, FIORINAL, TETANUS.   PHYSICAL EXAMINATION:  VITAL SIGNS:  Weight 210, temperature 97.9, blood  pressure 126/74, pulse 60.  GENERAL:  Pleasant, well-nourished, well-developed Caucasian female in  no acute distress.  SKIN:  Warm and dry. No jaundice.  HEENT:  Sclerae nonicteric. Oropharyngeal mucosa moist and pink.  ABDOMEN:  Positive bowel sounds.  Soft, nontender, nondistended.  No  organomegaly or masses.  No rebound or guarding.  No abdominal bruits or  hernias.  EXTREMITIES:  No edema.   IMPRESSION:  The patient is a 70 year old lady with recent biliary  pancreatitis, status post cholecystectomy.  She has made a full  recovery. During her hospitalization, she did have anemia and elevated  LFTs which need to be followed up on.  As a note, she also states that  her last colonoscopy was back in 2006 during a hospitalization.  We will  try to retrieve that report so  that we can make further recommendations regarding the timing of her  next colonoscopy.  She will also have LFTS, CBC in the near future with  Dr. [name].       Neil Crouch, P.A.  Electronically Signed     R. Garfield Cornea, M.D.  Electronically Signed    LL/MEDQ  D:  10/20/2007  T:  10/20/2007  Job:  [id number]   cc:   Delphina Cahill, M.D.

## 2010-10-07 NOTE — Op Note (Signed)
[name], [name] NO.:  [id number]   MEDICAL RECORD NO.:  [id number]          PATIENT TYPE:  INP   LOCATION:  A218                          FACILITY:  [facility]   PHYSICIAN:  Caro Hight, M.D.      DATE OF BIRTH:  [birth year]   DATE OF PROCEDURE:  DATE OF DISCHARGE:                               OPERATIVE REPORT   REFERRING PHYSICIAN:  Salem Caster, DO.   PRIMARY PHYSICIAN:  Jama Flavors. Redmond Pulling, M.D., of [facility].   PROCEDURE:  Esophagogastroduodenoscopy with cold forceps biopsy.   INDICATION FOR EXAM:  [name] is a 70 year old female who was  admitted with moderate pancreatitis.  During her hospitalization she had  a slow decrease in her hemoglobin from 12 to 7.8.  She never had melena,  hematochezia or hematemesis.   FINDINGS:  1. Multiple 3-4-mm sessile gastric polyps.  Biopsies obtained via cold      forceps.  2. Single antral erosion with deformities of the pylorus consistent      with prior peptic ulcer disease.  Biopsies obtained via cold      forceps to evaluate for H. pylori gastritis.  3. Normal duodenal bulb and second portion of the duodenum.  No old      blood or fresh blood seen in the stomach or the duodenum.  4. Multiple well-demarcated erythematous areas consistent with NG      trauma.   DIAGNOSIS:  No source for [name]'s decrease in hemoglobin  identified.   RECOMMENDATIONS:  1. No aspirin or anti-inflammatory drugs for 3 weeks.  2. She should continue omeprazole 20 mg twice a day for 4 weeks as an      outpatient and then daily.  3. We will await biopsies.  4. Follow-up visit with Dr. Gala Romney or Vickey Huger, N.P., in 3-4 weeks      to reassess her anemia and consider outpatient colonoscopy.   MEDICATIONS:  General anesthesia due to procedure being performed after  her laparoscopic cholecystectomy.   PROCEDURE TECHNIQUE:  Physical exam was performed.  Informed consent was  obtained from the patient after  explaining the benefits, risks and  alternatives to the procedure.  The patient was connected to the monitor  and placed in the left lateral position.  Continuous oxygen was provided  by endotracheal tube and IV medicine administered through an indwelling  cannula.  After administration of sedation, the patient's esophagus was  intubated and the scope was advanced under direct visualization to the  duodenum.  The scope removed slowly by carefully examining  the color, texture, anatomy and integrity of the mucosa on the way out.  The patient was recovered in endoscopy and discharged to the floor in  satisfactory condition.   PATH:  Benign fundic polyp. Mild gastropathy.      Caro Hight, M.D.  Electronically Signed     SM/MEDQ  D:  08/15/2007  T:  08/16/2007  Job:  [id number]   cc:   Jama Flavors. Redmond Pulling, M.D.  Fax: [phone]

## 2010-10-07 NOTE — H&P (Signed)
[name], [name] NO.:  [id number]   MEDICAL RECORD NO.:  [id number]          PATIENT TYPE:  EMS   LOCATION:  ED                            FACILITY:  [facility]   PHYSICIAN:  Bonnielee Haff, MD     DATE OF BIRTH:  August 17, 1940   DATE OF ADMISSION:  08/07/2007  DATE OF DISCHARGE:  LH                              HISTORY & PHYSICAL   PRIMARY CARE PHYSICIAN:  Jama Flavors. Redmond Pulling, M.D. at [facility].   CARDIOLOGIST:  Dr. William Hamburger; [place], [place].   ADMITTING DIAGNOSES:  1. Acute pancreatitis, of unclear etiology.  2. History of paroxysmal atrial fibrillation, on Coumadin.  3. History of type 2 diabetes.  4. History of depression.   CHIEF COMPLAINT:  Abdominal pain for the past 2 weeks.   HISTORY OF PRESENT ILLNESS:  The patient is a 70 year old Caucasian  female who was in her usual state of health about 2 weeks ago, when she  started noticing pain in her upper abdomen.  The pain was about 4 out of  10 in intensity to begin with, and then it progressed in the following 2  weeks to become 10 out of 10.  The pain radiates to the back.  It is  characterized as a cramping type of pain.  She also gives history of  nausea and a few episodes of emesis in the last couple of weeks.  She  denies any diarrhea.  Her Last BM was 3 days ago, but she has been  passing flatus.  Denies any fever or chills.   She mentioned that she had a similar episode in 2006, when she was  admitted to [facility].  No etiology for her  pancreatitis was found at that time.   MEDICATIONS AT HOME:  1. Levothyroxine 75 mcg once a day.  2. Furosemide 40 mg once a day.  3. Benazepril 40 mg once a day.  4. Warfarin 2.5 mg on Wednesday, and 5 mg the rest of the days.  5. Simvastatin 40 mg once a day.  6. Glyburide 2.5 mg once a day.  7. Metformin 500 mg once a day.  8. Celexa 20 mg once a day.  9. Omeprazole 20 mg once a day.  10.Flecainide 100 mg  b.i.d.   ALLERGIES:  FIORINAL, PENICILLIN (caused a very local reaction on her  skin; there was no systemic reaction), TETANUS.   PAST MEDICAL HISTORY:  Positive for pancreatitis in the 2006.  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  She has diabetes type 2, hypertension.  She had  a cardiac catheterization in 1995.  She had a left shoulder surgery at  [facility] in August 2008.  She had some kind of a cardiac assessment prior  to this surgery, but the family nor the patient is aware of what testing  was.  Apparently, they attempted a stress test but could not complete  it.  Anyway, she did proceed with this surgery without any  complications.  She remembers  that her HbA1c was between 6 and 7.  No  other surgeries are reported.   SOCIAL HISTORY:  She lives in [place] alone.  She denies smoking,  alcohol or illicit drug use.  She is independent with her daily  activities.   FAMILY HISTORY:  Positive for pancreatic cancer in one of her maternal  uncles.   REVIEW OF SYSTEMS:  GENERAL:  Positive for weakness and malaise.  HEENT:  Unremarkable.  CARDIOVASCULAR:  Unremarkable.  RESPIRATORY:  Unremarkable.  GI: As in HPI.  GU: Unremarkable. NEUROLOGIC:  Unremarkable.  PSYCHIATRIC:  Unremarkable.   PHYSICAL EXAMINATION:  VITAL SIGNS: Temperature 97.9, blood pressure  105/40, heart rate 68, respiratory rate 18, saturation 94% on room air.  GENERAL:  This is an obese white female in no distress.  She is slightly  lethargic because of a narcotic agent that was given to her a short  while ago.  HEENT:  There is no pallor and no icterus.  Oral mucous membranes are  moist.  No oral lesions are noted.  NECK:  Soft and supple.  No thyromegaly is appreciated.  LUNGS:  Clear to auscultation bilaterally, but there is a decreased air  entry at the bases.  CARDIAC:  S1 and S2.  Rhythm normal and regular.  No murmurs  appreciated.   ABDOMEN:  Soft.  There is tenderness in the epigastric area, much less  than the right upper quadrant.  No rebound, rigidity or guarding is  present.  Fullness was appreciated in the epigastrium.  EXTREMITIES:  Show no edema.  Peripheral pulses are palpable.  NEUROLOGIC:  The patient is alert and oriented x3.   LAB DATA:  White count 13,700, hemoglobin 13.2, MCV 88, platelet count  249.  PT/PTT is pending.  BMET :  Sodium 138, potassium 3.7, chloride  101, bicarb 30, glucose 169, BUN 34, creatinine 1.5, calcium 9.2.  Lipase was checked, 4020.  UA has not been done.   No imaging studies have been done.   ASSESSMENT:  This is a 70 year old Caucasian female who presents with a  couple weeks history of worsening abdominal pain and is found to have  elevated lipase level, which is consistent with a diagnosis of acute  pancreatitis.  She does have a history of this in the past.  The reason  for the pancreatitis is not clear.  She did have an ultrasound of the  abdomen the last time she had a similar presentation, and ruled out  gallstones.  There was a talk briefly about doing a cholecystectomy, but  since there was no conclusive evidence for gallstone pancreatitis, this  was never pursued.  We need to check her triglyceride level, which could  also cause pancreatitis.  I notice that the triglycerides were never  checked the last time she was admitted at [facility].  She also has mild  prerenal azotemia.   PLAN:  1. Acute Pancreatitis.  Will try to elucidate the reason for her      pancreatitis.  Will obtain consultation from GI.  Because of the      renal failure, I am unable to do a pancreatic protocol CAT scan.      So, I will hydrate her and attempt an ultrasound tomorrow, and then      once her renal function is better we can do a good pancreatic      protocol on her.  We will give her one dose of imipenem until we  are able to obtain a CAT scan.  GI input will also be  appreciated      with respect to antibiotic use for this patient. LFT's will be      checked.  2. History of Atrial Fibrillation.  She is currently in sinus rhythm.      She does have a history of paroxysmal atrial fibrillation .  She is      on Coumadin; will check her PT and PTT to make sure they are not in      a toxic range.  Flecainide is not known to cause pancreatitis      however we will hold it because of hypotension.  3. Hypothyroidism.  Continue her Synthroid.  I will hold off on her      antihypertensives and diuretics for now.  4. Diabetes.  We will do a CBG q.a.c. and h.s.  We will check her      HbA1c in the morning.  I will hold off on her oral hypoglycemic      agents for now.   CODE STATUS:  FULL CODE.   I think she merits close observation, at least for the next 24 hours on  a telemetry unit.  Further management decisions will depend on results  of the above-mentioned tests and her response to treatment.      Bonnielee Haff, MD  Electronically Signed     GK/MEDQ  D:  08/07/2007  T:  08/07/2007  Job:  [id number]   cc:   Jama Flavors. Redmond Pulling, M.D.  Fax: [phone]   R. Garfield Cornea, M.D.  [address]  [place]  [place] [address]

## 2010-10-07 NOTE — Group Therapy Note (Signed)
NAMEJINCY, [name] NO.:  [id number]   MEDICAL RECORD NO.:  [id number]          PATIENT TYPE:  INP   LOCATION:  A218                          FACILITY:  [facility]   PHYSICIAN:  Salem Caster, DO    DATE OF BIRTH:  01-19-1941   DATE OF PROCEDURE:  08/14/2007  DATE OF DISCHARGE:                                 PROGRESS NOTE   SUBJECTIVE:  [name] is sitting up in her bed.  States that she is  feeling pretty good at this time.  The patient denies any major  abdominal pain but still is tender in her right upper quadrant at deep  palpation.  The patient has no nausea, vomiting or diarrhea.  Overall  she is slowly improving.   OBJECTIVE:  VITAL SIGNS:  Temperature is 98.1, pulse 57, respirations  16, blood pressure 136/64.  O2 saturation is 95% on room air.  CARDIOVASCULAR:  S1 and S2 normal.  No rubs, gallops or murmurs.  LUNGS:  Clear to auscultation bilaterally.  No rales, rhonchi or  wheezing.  ABDOMEN:  She has slight epigastric tenderness with some right upper  quadrant pain on deep palpation.  Positive bowel sounds.  No rigidity or  guarding.  EXTREMITIES:  Trace edema bilateral lower extremities.  No clubbing or  cyanosis.   LABS:  Sodium 139, potassium 3.1, chloride 108, CO2 24, glucose 91, BUN  7, creatinine 0.99.  Alkaline phosphatase is 157, AST is 13, ALT 54.  White count is 6.2, hemoglobin 7.8, hematocrit 22.6, platelets 118.   ASSESSMENT AND PLAN:  1. Acute cholecystitis with pancreatitis.  The patient is scheduled to      have a laparoscopic cholecystectomy tomorrow.  The patient is being      seen by general surgery.  2. Anemia.  Unsure of the source of her anemia at this time.  The      patient is scheduled for CT of her abdomen as well as receiving 2      units of packed red blood cells.  3. Acute renal insufficiency has resolved, will continue to follow.      The patient will continue to be on IV fluid hydration.      Salem Caster,  DO  Electronically Signed     SM/MEDQ  D:  08/14/2007  T:  08/14/2007  Job:  [id number]

## 2010-10-07 NOTE — Op Note (Signed)
[name], [name] NO.:  [id number]   MEDICAL RECORD NO.:  [id number]          PATIENT TYPE:  INP   LOCATION:  A218                          FACILITY:  [facility]   PHYSICIAN:  Jamesetta So, M.D.  DATE OF BIRTH:  May 02, 1941   DATE OF PROCEDURE:  08/15/2007  DATE OF DISCHARGE:                               OPERATIVE REPORT   PREOPERATIVE DIAGNOSIS:  Cholecystitis, cholelithiasis, pancreatitis.   POSTOPERATIVE DIAGNOSIS:  Cholecystitis, cholelithiasis, pancreatitis.   PROCEDURE:  Laparoscopic cholecystectomy with cholangiograms.   SURGEON:  Dr. Aviva Signs.   ANESTHESIA:  General endotracheal.   INDICATIONS:  The patient is a 70 year old white female with history of  gallstone pancreatitis who now presents for a laparoscopic  cholecystectomy with cholangiograms.  The risks and benefits of the  procedure including bleeding, infection, hepatobiliary injury, and the  possibly of an open procedure were fully explained to the patient, who  gave informed consent.   PROCEDURE NOTE:  The patient was placed in supine position.  After  induction of general endotracheal anesthesia, the abdomen was prepped  and draped in the usual sterile technique with Betadine.  Surgical site  confirmation was performed.   A supraumbilical incision was made down to fascia.  Veress needle was  introduced into the abdominal cavity, and confirmation of placement was  done using the saline drop test.  The abdomen was then insufflated to 16  mmHg pressure.  An 11-mm trocar was introduced into the abdominal cavity  under direct visualization without difficulty.  The patient was placed  in reversed Trendelenburg position, an additional 11-mm trocar was  placed in the epigastric region, and 5-mm trocar was placed in the right  upper quadrant and right flank regions.  Liver was inspected and noted  be within normal limits.  The gallbladder was retracted superiorly and  laterally.  The  dissection was begun around the infundibulum of the  gallbladder.  The cystic duct was first identified.  Its juncture to the  infundibulum fully identified.  An Endoclip was placed proximally on the  cystic duct.  An incision was made into the cystic duct, and a  cholangiocatheter was inserted.  Under digital fluoroscopy,  cholangiograms were performed.  The patient was noted to have no  hepatobiliary defects.  The dye flowed freely into the duodenum.  The  patient was noted to have a long cystic duct with the common wall with  the common hepatic duct.  The system was flushed with normal saline.  The cholangiocatheter was removed, and multiple Endo clips were placed  distally on the cystic duct, and cystic duct was divided.  The cystic  artery was likewise ligated and divided.  The gallbladder freed away  from the gallbladder fossa using Bovie electrocautery.  The gallbladder  was delivered through the epigastric trocar site using EndoCatch bag.  Gallbladder fossa was inspected.  No abnormal bleeding or bile leakage  was noted.  Surgicel was placed in the gallbladder fossa.  All fluid and  air were then evacuated from the abdominal cavity prior to removal of  the trocars.  All wounds were irrigated with normal saline.  All wounds were injected  with 0.5% Sensorcaine.  The supraumbilical fascia was reapproximated  using a 0 Vicryl interrupted suture.  All skin incisions were closed  using staples.  Betadine ointment and dry sterile dressings were  applied.   All tape and needle counts were correct at the end of procedure.  The  patient was about to undergo an EGD by Dr. Stann Mainland while intubated.   COMPLICATIONS:  None.   SPECIMEN:  Gallbladder.   BLOOD LOSS:  Minimal.      Jamesetta So, M.D.  Electronically Signed     MAJ/MEDQ  D:  08/15/2007  T:  08/16/2007  Job:  [id number]   cc:   Jamesetta So, M.D.  Fax: [phone]   Caro Hight, M.D.  [address]  [place]  , [place] [address]

## 2010-10-07 NOTE — Letter (Signed)
January 26, 2008    Delphina Cahill, MD  [place],  [place] [address]   RE:  LADIAMOND, VEITH  MRN:  [id number]  /  DOB:  [birth year]   Dear Thedore Mins:   Ms. Klamm to return to the office today for continued assessment and  treatment of atrial fibrillation.  Since her last visit, she has done  fine.  She notes no palpitations.  She has no other cardiopulmonary  issues.  She continues to recover from left shoulder replacement  surgery.  She has limited range of motion, primarily due to weakness in  that arm related to her old CVA.   Medications are unchanged from her last visit except for the addition of  omeprazole 20 mg daily.   On exam, very pleasant, somewhat overweight woman in no acute distress.  The weight is 212, 5 pounds more than 3 months ago.  Blood pressure  115/65, heart rate 75, and regular, respirations 14.  NECK:  No jugular  venous distention; no carotid bruits.  LUNGS:  Clear.  CARDIAC:  Normal  first and second heart sounds; fourth heart sound present.  ABDOMEN:  Soft and nontender; no organomegaly.  EXTREMITIES:  No edema.   IMPRESSION:  Ms. Cillo is doing extremely well overall.  We will check  a flecainide level.  Management of Coumadin is carried out in your  office.  I will plan to see this nice woman again in 7 months.    Sincerely,      Cristopher Estimable. Lattie Haw, MD, [facility]  Electronically Signed    RMR/MedQ  DD: 01/26/2008  DT: 01/27/2008  Job #: [id number]

## 2010-10-07 NOTE — Group Therapy Note (Signed)
NAMECRYSTIE, [name] NO.:  [id number]   MEDICAL RECORD NO.:  [id number]          PATIENT TYPE:  INP   LOCATION:  A218                          FACILITY:  [facility]   PHYSICIAN:  Bonnielee Haff, MD     DATE OF BIRTH:  1941/03/03   DATE OF PROCEDURE:  08/10/2007  DATE OF DISCHARGE:                                 PROGRESS NOTE   SUBJECTIVE:  Patient complaining of about 5/10 pain in her upper  abdomen.  She denies any nausea or vomiting.  She says she continues to  pass flatus.  She is also complaining of some lower extremity pain,  especially when she tries to stand up.   OBJECTIVE:  Her vital signs show that her temperature was 100.2  overnight, heart rate 90, respiratory rate 18, blood pressure 110/59,  saturating 96% on 2 L.  ABG is 94, 191, 171.  BUN is 9.  LUNGS:  Clear to auscultation bilaterally.  No crackles are appreciated.  CARDIOVASCULAR:  Normal, regular.  ABDOMEN:  Obese, tender in the epigastric area, nondistended.  Bowel  sounds are present.  The extremities do not show any obvious lesions in the thigh area. No  calf tenderness.   LAB DATA:  All labs from today are pending at this time.   ASSESSMENT AND PLAN:  1. Acute pancreatitis.  She clinically appears to be slightly      improved.  Her labs yesterday also showed improvement.  We await GI      recommendations and input.  GI is to continue to follow her on a      daily basis.  2. Acute cholecystitis.  Gallstones noted on the ultrasound.  Dr.      Arnoldo Morale has been seeing her.  It appears from his notes that he was      wanting to do cholecystectomy at a later date after the      pancreatitis subsides.  3. Hypoglycemia in the setting of type 2 diabetes.  She is requiring      D10 drip.  Even with that her blood sugars are barely staying above      normal.  The reason for this is not clear, though the acute      pancreatitis could be one of the reasons.  In any case, we will      continue the D10  drip for now.  She is not on any insulin.  Once      her blood sugars are staying high, we may consider changing this      drip.  4. Acute renal failure.  Creatinine was up yesterday to 2.6.  Dr.      Lowanda Foster has consulted but he has not seen her yet.  He will be      reconsulted today and, hopefully, see her today.  I will try to      contact him myself.  We are awaiting renal function tests to come      back from today's labs.  5. History of paroxysmal atrial fibrillation on Coumadin.  She was  given a dose of vitamin K for reversing her INR yesterday.  We are      awaiting her PT/INR today.  6. Anemia.  She was detected to have a hemoglobin of 10.4 yesterday,      which went down from 12.0 the day before and 13.2 two days before.      This could be a result of dilution.  There is no evidence of overt      bleeding.  Iron profile studies are pending.  Stool for occult      blood is pending.  7. She has a history of hypothyroidism, which is stable.  8. Cardiology has seen her and following her.  A flecainide level is      pending.  Flecainide was discontinued when she came into the      hospital.  I will defer to cardiology to resume this.   So basically we are awaiting more improvement in her clinical status  with respect to acute pancreatitis.  We await more recommendations and  input from GI and then surgery.  Also await improvement in renal  function.  For this lower extremity pain, I am not sure what is causing  this.  We are going to check  a CK level to make sure there is no  rhabdomyolysis.      Bonnielee Haff, MD  Electronically Signed     GK/MEDQ  D:  08/10/2007  T:  08/10/2007  Job:  [id number]

## 2010-10-07 NOTE — Group Therapy Note (Signed)
NAMEFALICITY, SEMEL NO.:  [id number]   MEDICAL RECORD NO.:  [id number]          PATIENT TYPE:  INP   LOCATION:  A218                          FACILITY:  [facility]   PHYSICIAN:  Salem Caster, DO    DATE OF BIRTH:  1940-06-04   DATE OF PROCEDURE:  08/13/2007  DATE OF DISCHARGE:                                 PROGRESS NOTE   OBJECTIVE:  The patient was seen sitting in her bed.  The patient states  that her abdominal pain has greatly improved.  She is doing well and no  major changes in her condition.   OBJECTIVE:  VITAL SIGNS:  Temperature is 98.3, pulse 60, respirations  16, blood pressure 122/57.  She is satting 95% on room air.  CARDIOVASCULAR:  Regular rate and rhythm.  No rubs, gallops or murmurs.  LUNGS:  Clear to auscultation bilaterally.  No rales, rhonchi or  wheezing.  ABDOMEN:  Still has slight epigastric tenderness but it is improved.  No  rigidity, guarding.  Positive bowel sounds.  EXTREMITIES:  She has trace edema bilateral lower extremities.  No  clubbing or cyanosis.   LABS:  Lipase is 29, phosphorus is 3.3, magnesium 1.5, sodium 137,  potassium 3.9, chloride 104, CO2 27, glucose 456, BUN 5, creatinine  1.02, total bili is 0.9, direct bili 0.3, indirect bili 0.6, alkaline  phosphatase 227, AST 27, ALT 122, total protein 5.1, albumin 2.5, PTT is  43, PT is 18.8, INR 1.5.  White count is 6.6, hemoglobin 9.1, hematocrit  26.4, platelets 140.   ASSESSMENT AND PLAN:  1. Cholecystitis with pancreatitis.  The pancreatitis seems to be      resolving.  The patient is scheduled to have laparoscopic      cholecystectomy in the next 2 days.  She seems to be improving.      She is being followed daily by surgery.  2. Hypokalemia that has resolved.  3. Acute renal insufficiency that also has resolved.  The patient had      IV fluids continues to be ________.  4. Anemia.  Blood count is staying in the 9 range.  She will receive 1      unit of packed red  blood cells and need to continue to monitor her      hemoglobin and hematocrit at this time.      Salem Caster, DO  Electronically Signed     SM/MEDQ  D:  08/13/2007  T:  08/13/2007  Job:  [id number]

## 2010-10-07 NOTE — Group Therapy Note (Signed)
[name], LEDAY NO.:  [id number]   MEDICAL RECORD NO.:  [id number]          PATIENT TYPE:  INP   LOCATION:  A218                          FACILITY:  [facility]   PHYSICIAN:  Salem Caster, DO    DATE OF BIRTH:  [birth year]   DATE OF PROCEDURE:  08/15/2007  DATE OF DISCHARGE:                                 PROGRESS NOTE   SUBJECTIVE:  [name] is status post laparoscopic cholecystectomy. The  patient states that she is having some pain at this time. Overall, she  states she is doing fairly well.   OBJECTIVE:  VITAL SIGNS:  Temperature 97.8, pulse 65, respiratory rate  20, blood pressure 161/81. She is sating 96% on 3 liters.  CARDIOVASCULAR:  S1 and S2 are normal.  LUNGS:  Clear to auscultation bilaterally. No rales, rhonchi, or  wheezes.  ABDOMEN:  Tender at the surgical site with decreased bowel sounds.  EXTREMITIES:  Trace edema.   LABORATORY DATA:  Potassium 4.0, sodium 141, chloride 114, CO2 22,  glucose 121, BUN 6, creatinine 7.73. White count 8.3. Hemoglobin and  hematocrit 9.2 and 26.7. Platelets 109,000.   ASSESSMENT/PLAN:  1. Status post laparoscopic cholecystectomy. The patient has been      followed by surgery.  2. Hypokalemia, is being replaced. Will continue to monitor.  3. Anemia. Will continue to monitor her hemoglobin and hematocrit.  4. Gastritis. Will continue to hold aspirin and NSAIDs. Continue to be      on PPI b.i.d. for 4 weeks.      Salem Caster, DO  Electronically Signed     SM/MEDQ  D:  08/15/2007  T:  08/15/2007  Job:  [id number]

## 2010-10-07 NOTE — Consult Note (Signed)
[name], [name] NO.:  [id number]   MEDICAL RECORD NO.:  [id number]          PATIENT TYPE:  INP   LOCATION:  A218                          FACILITY:  [facility]   PHYSICIAN:  [name], M.D.  DATE OF BIRTH:  09-20-1940   DATE OF CONSULTATION:  08/08/2007  DATE OF DISCHARGE:                                 CONSULTATION   REASON FOR CONSULTATION:  Cholecystitis, acute pancreatitis.   HISTORY OF PRESENT ILLNESS:  The patient is a 70 year old white female  who presents with a 10-day history of worsening upper abdominal pain.  She was found to have acute pancreatitis and was admitted to the  hospital for further evaluation and treatment.  Interestingly, she did  have an episode of pancreatitis in 2006, but no cholelithiasis was  found, and thus the patient did not undergo a cholecystectomy.  During  her admission, she had an ultrasound of the gallbladder which revealed a  thickened gallbladder wall, cholelithiasis, a common bile duct which was  dilated at 7 mm, and question of a pseudocyst at the head of the  pancreas.   PAST MEDICAL HISTORY:  1. Noninsulin-dependent diabetes mellitus.  2. Atrial fibrillation.  3. Depression.  4. History of CVA, with left-sided decreased function in 2004.  5. Hypertension.   PAST SURGICAL HISTORY:  Left shoulder replacement in 2008 at [place]:  Prilosec, Lasix, benazepril,  levothyroxine, glyburide, metformin, Celexa, warfarin, flecainide.   ALLERGIES:  1. FIORINAL.  2. PENICILLIN.  3. TETANUS.   REVIEW OF SYSTEMS:  The patient denies drinking or smoking.   PHYSICAL EXAMINATION:  GENERAL:  The patient is a pleasant 70 year old  white female in no acute distress.  VITAL SIGNS:  She is afebrile, and pulse was 70, blood pressure 130/76,  respirations 18.  ABDOMEN:  Soft, with tenderness noted in the right upper quadrant and  epigastric regions.  No  hepatosplenomegaly, masses, or hernias are  identified.  No rigidity is noted.   INR is 2.4.  Amylase 126, lipase 1645, BUN 35, creatinine 2.06, SGOT  1558, SGPT 1183, alkaline phosphatase 400, total bilirubin 2.6, calcium  9.  White blood cell count 12.5, hematocrit 35, platelet count 205.   IMPRESSION:  1. Recurrent acute pancreatitis.  2. Cholelithiasis.  3. Question choledocholithiasis.  4. Question early pancreatic pseudocyst formation.  5. Renal insufficiency.  6. Chronic atrial fibrillation, with chronic anticoagulation.  7. History of cerebrovascular accident.   PLAN:  At this point, surgery is not indicated, given the multitude of  problems including acute pancreatitis and her anticoagulation due to  chronic atrial fibrillation.  I would keep her n.p.o., except for  medications.  She may warrant TPN until her pancreatitis resolves, which  I feel this may take a little while.  I will discuss with GI further  management concerning her common bile duct and her pancreatic  pseudocyst.  She also may need a cardiology consult due to her history  of atrial fibrillation.  Interestingly, her 12-lead EKG shows a sinus  rhythm with a  first-degree AV block.  Further management is pending how  her pancreatitis resolves and her cardiology workup.  I will follow the  patient closely with you.      [name], M.D.  Electronically Signed     MAJ/MEDQ  D:  08/08/2007  T:  08/09/2007  Job:  [id number]   cc:   [name], M.D.  [id number]  [place]  [place] [address]

## 2010-10-07 NOTE — Discharge Summary (Signed)
[name], [name] NO.:  [id number]   MEDICAL RECORD NO.:  [id number]          PATIENT TYPE:  INP   LOCATION:  A218                          FACILITY:  [facility]   PHYSICIAN:  Diane Caster, DO    DATE OF BIRTH:  1941/04/15   DATE OF ADMISSION:  08/07/2007  DATE OF DISCHARGE:  03/25/2009LH                               DISCHARGE SUMMARY   DISCHARGE DIAGNOSES:  1. Acute cholecystitis with laparoscopic cholecystectomy with      cholangiograms.  2. Hypokalemia. This has resolved.  3. Anemia.  4. Gastritis.  5. Acute pancreatitis.  6. History of paroxysmal atrial fibrillation.  7. History of type 2 diabetes.  8. History of depression.   BRIEF HOSPITAL COURSE:  This is a 70 year old Caucasian female who  presented with upper abdominal pain.  The patient states the pain was  4/10 intensity and progressed for 2 weeks to become a 10/10. She states  that the pain radiated to her back.  The patient also gave a history of  nausea and emesis but no diarrhea and passing flatulence.  The patient  had a similar episode in 2006 and was admitted to [name]  for pancreatitis.  Upon being seen,  initial labs showed a white count  of 13,700, hemoglobin 13.2, platelet count 249. Sodium 138, potassium  3.7, chloride 101, bicarb 30, glucose 169, BUN 34, creatinine 1.5.  Lipase was 4020. She was diagnosed with acute pancreatitis.  She was  admitted, placed on IV fluids and IV pain medication.   She did have an ultrasound of her abdomen which showed findings highly  suspicious for acute cholecystitis with gallstones, gallbladder wall  thickening and edema. Common bile duct is mildly prominent and  obstruction of the common bile duct stone or mass was not excluded.  Sonographic findings suggestive of pancreatitis. Question of pancreatic  pseudocyst.   She also had a chest x-ray performed which did show no active infiltrate  or pleural effusion.  There was stable left  basilar atelectasis.  There  is stable thickening of azygos fissure in the right upper lobe.   A CT of abdomen was performed on August 12, 2007 which showed similar  findings from the ultrasound. It did also show some small bilateral  pleural effusions and bibasilar atelectasis with some small scattered  mesenteric and retroperitoneal lymph nodes slightly inflammatory. The  pelvis showed some diffuse diverticulosis of the  sigmoid colon and a  small amount free pelvic fluid lightly related to the patient's  pancreatitis.  The patient began to become anemic. Repeat CT of her  abdomen and pelvis prior to surgery showed:  1)  Acute pancreatitis,  slight increase in coalescence of the pancreatic phlegmon. 2)  Cholelithiasis. 3)  Atherosclerosis. 4) Bilateral small pleural effusion  with associated increasing atelectasis. 5) Bilateral renal scarring and  atrophic left kidney. The pelvis showed a tiny amount of free fluid with  the patient's pancreatitis.  The patient subsequently underwent a  laparoscopic cholecystectomy with cholangiograms on March 23, [id number], no  complications. Right after the procedure the patient was still  intubated  and underwent an EGD with biopsy. Findings showed 1) multiple 3-4 mm  sessile gastric polyps.  Biopsies were obtained, 2) single anterior  erosions with deformities of the pylorus consistent with prior peptic  ulcer disease, 3) normal duodenal bulb of the second portion of the  duodenum, no old blood or fresh blood was seen, 4) multiple well  demarcated erythematous areas consistent with NG trauma. Conclusion:  There was no source of her decrease in hemoglobin identified. It was  recommended the patient have no aspirin or antiinflammatory drugs for 3  weeks. She should continue her omeprazole twice a day for 4 weeks and  then daily. Await biopsies and she is to followup with Dr. Gala [name] in 3-4  weeks to reassess her anemia and possible outpatient colonoscopy.   The  patient will be anticipated to be discharged on August 17, 2007.  Temperature as of today is 91.1, pulse 42, respirations 16, blood  pressure 173/72.  She is satting 99% on 2 liters. The last labs,  phosphorus is 4.3, magnesium 1.5.  Sodium 138, potassium 4.2, chloride  is 105, CO2 28, glucose 184, BUN 8, creatinine 1.06. AST is 41, ALT 61,  total protein 5.3, alkaline phosphatase is 160. White count is 8200,  hemoglobin is 11.6, hematocrit 34.1, platelets of 171.   CONDITION ON DISCHARGE:  Stable.   DISPOSITION:  I believe the patient will be discharged to home.   DISCHARGE MEDICATIONS:  1. Omeprazole 20 mg twice a day x4 weeks and then daily.  2. Furosemide my 80 mg 1/2 tablet daily.  3. Benazepril 40 mg daily.  4. Levothyroxine 75 mcg daily.  5. Glyburide 5 mg 1/2 tablet daily.  6. Metformin 1000 mg 1/2 tablet daily.  7. Celexa 20 mg daily.  8. Warfarin. At this time she is on 5 mg daily.  9. Flecainide 100 mg twice a day.  10.The patient is to have no aspirin or anti-inflammatories x3 weeks.   The patient is to follow up with her PCP in the next 5-7 days and  followup with Dr.  Gala [name] in 3-4 weeks to reassess her anemia and  possible outpatient colonoscopy. The patient is to slowly increase her  diet to a low-fat heart-healthy diet. She is to dress her wound per  surgery recommendations, to increase her activity slowly. The patient is  to followup as mentioned with her primary care physician and  cardiologist at the next scheduled appointment.      Diane Caster, DO  Electronically Signed     SM/MEDQ  D:  08/16/2007  T:  08/16/2007  Job:  [id number]

## 2010-10-10 NOTE — H&P (Signed)
[name], [name] NO.:  [id number]   MEDICAL RECORD NO.:  [id number]          PATIENT TYPE:  EMS   LOCATION:  MAJO                         [facility]ACILITY:  [name]   PHYSICIAN:  Sherryl Manges, M.D.  DATE OF BIRTH:  [birth year]   DATE OF ADMISSION:  07/16/2004  DATE OF DISCHARGE:                                HISTORY & PHYSICAL   PRIMARY CARE PHYSICIAN:  Dr. Ernie Hew of [facility].   CHIEF COMPLAINT:  Abdominal pain, nausea, vomiting, anorexia for five days.   HISTORY OF PRESENT ILLNESS:  This is a 70 year old female with a known  history of type 2 diabetes mellitus, hypertension, GERD, atrial  fibrillation, hypothyroidism. She developed a cold approximately three  weeks ago with sore throat, cough productive of greenish phlegm which she  treated with fluids and ibuprofen. The sore throat subsided, but she still  has a productive cough. On July 12, 2004, she felt unwell with nausea.  On July 13, 2004, she developed upper abdominal pain, vomited at least  ten times. This was food. No coffee grounds or hematemesis noted. The  abdominal pain has been constant since. She has had watery diarrhea for one  day only. By July 14, 2004, vomiting had also subsided, but abdominal  pain continued. She has no appetite. Denies dysuria or urinary frequency.  She had a fever of 102 on July 15, 2004, with chills. She went to her  PMD, had chest x-ray done, bloodwork, and abdominal CT scan, and was  diagnosed with acute pancreatitis. She was allowed home at that time,  advised to take adequate foods. Apparently gallstones was an incidental  finding. The abdominal pain is worse today and the patient is still having  fever. She called her PMD who referred her to the emergency department .   PAST MEDICAL HISTORY:  1.  Type 2 diabetes mellitus for three days.  2.  Hypertension approximately for 10 years.  3.  Hypothyroidism in 1962.  4.  GERD.  5.   Dyslipidemia.  6.  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  7.  Atrial fibrillation, April 2004, on anticoagulation.  8.  Depression.   MEDICATIONS:  1.  Synthroid 75 mcg p.o. daily.  2.  Lasix 40 mg p.o. daily.  3.  Mavik 40 mg p.o. daily.  4.  Protonix 40 mg daily.  5.  Glyburide 2.5 mg p.o. daily.  6.  Metformin 500 mg p.o. daily.  7.  Coumadin 2.5 mg alternating with 5 mg daily.  8.  Flecainide 150 mg p.o. b.i.d.  9.  Zocor 40 mg p.o. daily.  10. Lexapro 10 mg p.o. daily.   ALLERGIES:  PENICILLIN/TETANUS PROPHYLAXIS.   SYSTEMS REVIEW:  As per HPI and chief complaint. Otherwise, negative.   FAMILY AND SOCIAL HISTORY:  The patient manages property part-time. She is  single. She is a nondrinker, nonsmoker. She has one daughter. She has two  sisters both of whom have osteoporosis and osteoarthritis. Her father is  deceased, status post MI. Her mother is deceased, status post lung cancer.  She  had one brother who died of cirrhosis due to alcohol abuse at age 39  years.   PHYSICAL EXAMINATION:  VITAL SIGNS: Temperature 99.1, pulse 72 per minute,  respiratory rate 18, blood pressure 92/61 mmHg. Pulse oximetry 95% on room  air.  GENERAL: The patient appears quite comfortable. Not short of breath at rest,  alert, communicative.  HEENT: No frank pallor, no jaundice. She looks somewhat dry.  NECK: Supple. JVP not seen. No palpable lymphadenopathy. No palpable goiter.  CHEST: Clinically clear apart from occasional expiratory rhonchi.  HEART: S1 and S2 heard. Normal. Regular. No murmurs.  ABDOMEN: Obese. Soft. There is tenderness in the epigastrium with no  guarding or rebound. There is no palpable organomegaly. Bowel sounds are  normal.  EXTREMITIES: No pitting edema. Palpable peripheral pulses.  MUSCULOSKELETAL: Not formally examined.  CENTRAL NERVOUS SYSTEM: Not formally examined, however the patient is not  dysarthric and is moving all limbs.    INVESTIGATIONS:  CBC reveals WBC of 21.3, hemoglobin 13.5, hematocrit 39.5,  platelet count 183,000. Electrolytes reveal sodium of 138, potassium 3.2,  chloride 100, CO2 30, BUN 13, creatinine 1.7, glucose 65. LFTs reveal an AST  of 45, ALT 52, alkaline phosphatase 129, total bilirubin 0.9. Amylase 15,  lipase 20.  Urinalysis reveals pH of 5.0, specific gravity  1.033, leukocyte  esterase positive, wbc's 7-10, bacteria rare.   ASSESSMENT/PLAN:  1.  Abdominal pain preceded by anorexia, nausea, vomiting, and diarrhea. The      patient was found to have elevated amylase and lipase in her PMD's      office on July 15, 2004, and also abdominal CT scan done at an      outside facility, reportedly showed changes consistent with      pancreatitis. Lipase level appeared normal in the emergency department,      however the patient is still symptomatic. We shall manage her with      intravenous fluids, NPO initially, analgesics,  proton pump inhibitor,      and antiemetics. We shall also arrange abdominal ultrasound scan. Should      clinical condition fail to improve we may have to repeat abdominal CT      scan.   1.  Acute bronchitis. The patient has a three week history of upper      respiratory tract symptoms, and in particular, cough productive of      greenish phlegm and she has an elevated WBC. Physical examination      revealed occasional rhonchi consistent with acute bronchitis. We shall      therefore manage with Avelox.   1.  Fever and elevated white blood cell count. This is likely secondary to      #1 and #2 above. We will do blood cultures as part of septic screen.      Urinalysis showed elevated WBC, but rare bacteria. However, should this      be early UTI Avelox should suffice for coverage.   1.  Diabetes mellitus. Will manage with sliding scale insulin until oral      intake is re-established.  1.  Hypothyroidism. Will recheck TSH and continue Synthroid on pre-admission       dosages if appropriate, when oral intake re-established.   1.  History of hypertension. Blood pressure is in the low 90s likely      secondary to dehydration. We shall hold Lasix and Mavik for now and      monitor.   1.  Atrial fibrillation.  Appears rate controlled. Will do 12-lead EKG for      completeness. INR appears to be supratherapeutic at 5.7. We will      therefore hold for now and monitor.   1.  Mild hypokalemia. We shall replete this now and monitor electrolytes.      Further management will depend on clinical course.      CO/MEDQ  D:  07/16/2004  T:  07/16/2004  Job:  [id number]

## 2010-10-10 NOTE — Discharge Summary (Signed)
[name], [name] NO.:  [id number]   MEDICAL RECORD NO.:  [id number]          PATIENT TYPE:  INP   LOCATION:  [id number]                         FACILITY:  [facility]   PHYSICIAN:  Aquilla Hacker, M.D. DATE OF BIRTH:  May 07, 1941   DATE OF ADMISSION:  07/16/2004  DATE OF DISCHARGE:  07/27/2004                                 DISCHARGE SUMMARY   PRIMARY CARE PHYSICIAN:  Dr. Ernie Hew of [facility].   FINAL DIAGNOSES AT THE TIME OF DISCHARGE:  1.  Acute pyelonephritis.  2.  Hypokalemia.  3.  Anemia.  4.  Bronchospasm.  5.  Gallstone pancreatitis.  6.  Shortness of breath.  7.  Diverticulosis.  8.  Colonic polyps.   SECONDARY DIAGNOSES:  1.  Diabetes mellitus.  2.  Gastroesophageal reflux disease.  3.  Atrial fibrillation.   CONSULTATIONS:  1.  Gastroenterology.  2.  CCS.   HISTORY OF PRESENT ILLNESS:  Ms. Amon is a 70 year old female with a past  medical history of type 2 diabetes mellitus, hypertension, hypothyroidism  and atrial fibrillation who arrived with a chief complaint of abdominal  pain, nausea, vomiting and anorexia for 5 days.  She was admitted into the  hospital for further evaluation of these symptoms.   PAST MEDICAL HISTORY:  1.  Type 2 diabetes mellitus for 3 days.  2.  Hypertension.  3.  Hypothyroidism.  4.  GERD.  5.  Dyslipidemia.  6.  Atrial fibrillation, now on anticoagulation, diagnosed April, 2004.  7.  Depression.  8.  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   FAMILY HISTORY:  Patient's father died secondary to an MI.  Mother is  deceased secondary to lung cancer.  Patient's brother died from cirrhosis  secondary to alcohol abuse at the age of 63.   SOCIAL HISTORY:  The patient denies alcohol and denies cigarettes.   HOSPITAL COURSE:  1.  Abdominal pain.  The patient was admitted into the hospital for further      evaluation.  Her labs were looked at and the AST was  found to be 44, 45,      ALT was 52, alkaline phosphatase 129 and a total bilirubin of 0.9.      Likewise, her amylase and lipase were checked and both of them were      normal.  It was reported that the patient had a CAT scan done at an      outside facility which showed changes consistent with pancreatitis,      although her lipase and amylase were normal.  She was started on IV      fluids, made n.p.o. and given pain medications in addition to Protonix      and antiemetic.  Gastroenterology was consulted for further evaluation      and they saw the patient on July 21, 2004.  The patient also had a      CAT scan completed on July 21, 2004.  The final impression was: 1)      Extensive pancreatitis.  No features to suggest pancreatic necrosis or      abscess.  No discrete pseudocyst formation at this time.  2) Bilateral      basilar pleural effusions with associated left lower lobe atelectasis.   CT of the pelvis with contrast was also completed and it showed no acute  findings in the pelvis.  Gastroenterology saw the patient and Dr. Olevia Perches was  the physician who responded to the consultation.  Her assessment that the  patient had pancreatitis and it was likely biliary vs. medication induced.  She continued to follow the patient over the course of the hospitalization.  By the following day she stated that the patient had gallstone pancreatitis  and wanted Surgery's opinion with regards to whether or not an intervention  should be done.  On July 22, 2004, Dr. Marlou Starks saw the patient and went on  to state that the ultrasound and CT scan did not show definite gallstones,  so the source of the pancreatitis was not completely clear.  He went on to  state that he would wait for GI to complete their workup before he gave  additional recommendations.  By July 23, 2004, Surgery indicated that the dp  had no surgical intervention needed at this particular time, and on July 24, 2004, the  patient underwent a colonoscopy.  The final impression was that it  was an abnormal exam, there were colon polyps present and there was  diverticulosis present.  Later that day the patient indicated that she was  feeling much better and, likewise, each subsequent day the patient indicated  that the pain had improved significantly.   1.  Shortness of breath.  Again, during the hospitalization complained of      some shortness of breath and a chest x-ray was completed at the time of      admission which revealed interval development of mild interstitial      edema, bilateral pleural effusions, left greater than the right, with      left lobe atelectasis.  Repeat chest x-rays were done on the 28th which      showed no significant interval change.  On July 24, 2004, a chest x-ray      showed developed of dense right lower lobe atelectasis and stable dense      left lower lobe atelectasis with patchy air space disease.  On July 25, 2004, the patient had a chest CT completed secondary to a complaint of      shortness of breath which revealed no evidence of pulmonary      thromboembolus, bilateral effusions, patchy ground glass opacities in      the right upper lobe, likely multifocal patchy air space disease, and      mildly prominent hilar nodes likely due to inflammatory process.  By the      time the patient was discharged from the hospital, the breathing issues      had resolved.   1.  Pyelonephritis.  When the patient was initially admitted into the      hospital the patient was diagnosed with a UTI and Avelox was initiated.      In addition, the patient had been placed on tobramycin.  However, the      patient's creatinine was noted to increase by July 18, 2004, and      tobramycin was discontinued.  Therefore, Rocephin was started to treat      the acute pyelonephritis,  and throughout the course of the      hospitalization the patient appeared to do fine.  1.  Anemia.  The  patient's hemoglobin by July 22, 2004, was noted to      have been 9.3 and by the following day it was noted to be 9.5.  This was      monitored over the course of the patient's hospitalization.  By July 26, 2004, the patient stated that she was feeling much better and she      requested to go home.  The patient's condition at the time of discharge      was improved.  By July 27, 2004, the decision was made to discharge the      patient home, the patient had no complaints on this particular day.   The patient was discharged home on the following medications:  1.  Lovenox injection at 9 a.m. and at 9 p.m.  2.  Coumadin 5 mg p.o. daily.  3.  Atrovent MDI 2 puffs q.8 hours.  4.  K-Dur 20 mEq p.o. daily.  5.  Albuterol MDI 2 puffs q.8 hours.  6.  Synthroid 75 mcg one tablet daily.  7.  Protonix 40 mg daily.  8.  Lasix 40 mg daily.  9.  Glyburide 2.5 mg daily.  10. Lexapro 10 mg daily.  11. Flecainide 150 mg b.i.d.  12. Percocet 5/325 one tablet q.6 hours p.r.n.   The patient was instructed to followup with Dr. Ernie Hew within 1-2 weeks and  to take all medication as prescribed.      OR/MEDQ  D:  10/22/2004  T:  10/22/2004  Job:  [id number]   cc:   Rachell Cipro, M.D.  Fax: [phone]

## 2010-10-10 NOTE — Consult Note (Signed)
[name], CHAPLAIN NO.:  [id number]   MEDICAL RECORD NO.:  [id number]          PATIENT TYPE:  INP   LOCATION:  3733                         FACILITY:  [facility]   PHYSICIAN:  Sammuel Hines. Daiva Nakayama, M.D. DATE OF BIRTH:  [birth year]   DATE OF CONSULTATION:  09/18/2004  DATE OF DISCHARGE:  07/27/2004                                   CONSULTATION   [name] is a 70 year old white female who was admitted with a one-week  history of fevers, chills, malaise, nausea and vomiting, and abdominal pain.  She was diagnosed with gallstones and pancreatitis.  Gallbladder ultrasound  done here at [facility] was negative.  She has continued to have some epigastric  pain radiating into her upper abdomen.  Her nausea and vomiting had resolved  at the time of the interview.  No other chest pain, shortness of breath,  diarrhea or dysuria.   PAST MEDICAL HISTORY:  1.  Diabetes.  2.  Hypertension.  3.  Gastroesophageal reflux.  4.  Atrial fibrillation.  5.  Hypothyroidism.  6.  Degenerative joint disease.  7.  Depression.  8.  Hyperlipidemia.   PAST SURGICAL HISTORY:  Unknown.   MEDICATIONS:  Rocephin, Lexapro, Tambocor, glyburide, insulin, Protonix,  Coumadin.   ALLERGIES:  PENICILLIN and TETANUS.   SOCIAL HISTORY:  She denies any use of alcohol or tobacco products.   FAMILY HISTORY:  Significant for lung cancer in her mother and coronary  artery disease and MI in the father.   PHYSICAL EXAMINATION:  VITAL SIGNS:  Temperature 98.1, blood pressure  117/62, pulse of 58.  GENERAL:  She is a well-developed, well-nourished white female in no acute  distress.  SKIN:  Warm and dry, no jaundice.  HEENT:  Eyes with extraocular muscles intact.  Her pupils equal, round, and  reactive to light.  Sclerae are nonicteric.  CHEST:  Lungs are clear bilaterally with no use of accessory respiratory  muscles.  CARDIAC:  Regular rate and rhythm with an impulse on the left chest.  ABDOMEN:  Soft  with some mild epigastric tenderness but no guarding or  peritoneal signs, no palpable mass or hepatosplenomegaly.  EXTREMITIES:  No cyanosis, clubbing or edema.  PSYCHOLOGICAL:  She is alert and oriented x3 with no evident state of  anxiety or depression.   On review of her lab work, her total bilirubin was 0.4, alkaline phosphatase  156, SGOT 25, SGPT 61.  White count was 10.  Ultrasound was reviewed and  showed no gallstones or gallbladder wall thickening.   ASSESSMENT AND PLAN:  This is a 70 year old white female with some  generalized abdominal pain, worse on the left, for the last week or two.  An  outside CT suggested a diagnosis of pancreatitis.  Ultrasound here at [facility]  was negative for any stones or gallbladder wall thickening.  A source of her  abdominal pain is not exactly clear.  I would agree with repeating a CT scan  of her abdomen and pelvis to help sort this out.  We will continue to follow  her with you.  Should  she need any surgery done, she would need cardiac  clearance prior to surgery given her heart history and correction of her  coagulation studies since she is on Coumadin.   Again, thank you for allowing [place] to help in the care of this patient.      PST/MEDQ  D:  09/18/2004  T:  09/18/2004  Job:  [id number]

## 2011-02-16 LAB — CBC
HCT: 26.2 — ABNORMAL LOW
HCT: 26.4 — ABNORMAL LOW
HCT: 32.1 — ABNORMAL LOW
HCT: 34.1 — ABNORMAL LOW
HCT: 35 — ABNORMAL LOW
HCT: 37.9
Hemoglobin: 10.4 — ABNORMAL LOW
Hemoglobin: 10.9 — ABNORMAL LOW
Hemoglobin: 11.2 — ABNORMAL LOW
Hemoglobin: 12
Hemoglobin: 13.2
Hemoglobin: 9 — ABNORMAL LOW
MCHC: 33.8
MCHC: 34.4
MCHC: 34.4
MCHC: 34.5
MCHC: 34.7
MCHC: 35
MCV: 88.8
MCV: 90.1
MCV: 91
MCV: 91.2
Platelets: 109 — ABNORMAL LOW
Platelets: 118 — ABNORMAL LOW
Platelets: 131 — ABNORMAL LOW
Platelets: 140 — ABNORMAL LOW
Platelets: 171
RBC: 3.11 — ABNORMAL LOW
RBC: 3.31 — ABNORMAL LOW
RBC: 3.52 — ABNORMAL LOW
RBC: 3.57 — ABNORMAL LOW
RDW: 13.7
RDW: 13.9
RDW: 14.1
RDW: 14.3
RDW: 14.9
RDW: 15.1
RDW: 15.2
WBC: 11.1 — ABNORMAL HIGH
WBC: 12.5 — ABNORMAL HIGH
WBC: 7.2
WBC: 8.2
WBC: 9.1

## 2011-02-16 LAB — DIFFERENTIAL
Basophils Absolute: 0
Basophils Absolute: 0
Basophils Absolute: 0
Basophils Absolute: 0
Basophils Absolute: 0
Basophils Absolute: 0
Basophils Absolute: 0
Basophils Absolute: 0.1
Basophils Relative: 0
Basophils Relative: 0
Basophils Relative: 0
Basophils Relative: 0
Basophils Relative: 1
Basophils Relative: 1
Eosinophils Absolute: 0.1
Eosinophils Absolute: 0.1
Eosinophils Absolute: 0.2
Eosinophils Relative: 0
Eosinophils Relative: 0
Eosinophils Relative: 1
Eosinophils Relative: 2
Eosinophils Relative: 2
Eosinophils Relative: 3
Eosinophils Relative: 3
Eosinophils Relative: 3
Lymphocytes Relative: 11 — ABNORMAL LOW
Lymphocytes Relative: 15
Lymphocytes Relative: 15
Lymphocytes Relative: 18
Lymphocytes Relative: 19
Lymphocytes Relative: 20
Lymphocytes Relative: 7 — ABNORMAL LOW
Lymphocytes Relative: 9 — ABNORMAL LOW
Lymphs Abs: 0.8
Lymphs Abs: 1.4
Lymphs Abs: 1.5
Lymphs Abs: 1.5
Monocytes Absolute: 0.5
Monocytes Absolute: 0.5
Monocytes Absolute: 0.5
Monocytes Absolute: 0.6
Monocytes Absolute: 0.6
Monocytes Absolute: 0.7
Monocytes Absolute: 0.8
Monocytes Absolute: 0.8
Monocytes Relative: 6
Monocytes Relative: 7
Monocytes Relative: 7
Monocytes Relative: 7
Monocytes Relative: 7
Neutro Abs: 10.9 — ABNORMAL HIGH
Neutro Abs: 4.2
Neutro Abs: 6.2
Neutro Abs: 6.4
Neutro Abs: 6.6
Neutro Abs: 6.6
Neutro Abs: 7
Neutrophils Relative %: 68
Neutrophils Relative %: 74
Neutrophils Relative %: 81 — ABNORMAL HIGH
Neutrophils Relative %: 85 — ABNORMAL HIGH
Neutrophils Relative %: 88 — ABNORMAL HIGH

## 2011-02-16 LAB — COMPREHENSIVE METABOLIC PANEL
ALT: 362 — ABNORMAL HIGH
AST: 135 — ABNORMAL HIGH
AST: 50 — ABNORMAL HIGH
Albumin: 2.6 — ABNORMAL LOW
Albumin: 2.6 — ABNORMAL LOW
Alkaline Phosphatase: 241 — ABNORMAL HIGH
Alkaline Phosphatase: 400 — ABNORMAL HIGH
BUN: 35 — ABNORMAL HIGH
CO2: 28
CO2: 29
CO2: 30
Calcium: 8.1 — ABNORMAL LOW
Calcium: 8.2 — ABNORMAL LOW
Chloride: 104
Chloride: 104
Chloride: 107
Creatinine, Ser: 2.62 — ABNORMAL HIGH
GFR calc Af Amer: 35 — ABNORMAL LOW
GFR calc Af Amer: 48 — ABNORMAL LOW
GFR calc non Af Amer: 18 — ABNORMAL LOW
GFR calc non Af Amer: 29 — ABNORMAL LOW
Glucose, Bld: 139 — ABNORMAL HIGH
Glucose, Bld: 85
Potassium: 3.5
Potassium: 4.8
Sodium: 136
Total Bilirubin: 0.8
Total Bilirubin: 2.6 — ABNORMAL HIGH
Total Protein: 5.6 — ABNORMAL LOW

## 2011-02-16 LAB — CROSSMATCH
ABO/RH(D): O POS
Antibody Screen: NEGATIVE

## 2011-02-16 LAB — HEPATIC FUNCTION PANEL
ALT: 61 — ABNORMAL HIGH
AST: 13
AST: 41 — ABNORMAL HIGH
AST: 988 — ABNORMAL HIGH
Albumin: 2.3 — ABNORMAL LOW
Albumin: 2.5 — ABNORMAL LOW
Albumin: 4.2
Alkaline Phosphatase: 160 — ABNORMAL HIGH
Alkaline Phosphatase: 313 — ABNORMAL HIGH
Bilirubin, Direct: 0.2
Indirect Bilirubin: 0.6
Total Bilirubin: 1.7 — ABNORMAL HIGH
Total Protein: 5.1 — ABNORMAL LOW
Total Protein: 5.3 — ABNORMAL LOW

## 2011-02-16 LAB — BASIC METABOLIC PANEL
BUN: 5 — ABNORMAL LOW
BUN: 6
BUN: 7
BUN: 7
BUN: 8
CO2: 24
CO2: 27
CO2: 30
Calcium: 6 — CL
Calcium: 6.9 — ABNORMAL LOW
Calcium: 7.8 — ABNORMAL LOW
Calcium: 8.1 — ABNORMAL LOW
Chloride: 101
Chloride: 104
Chloride: 106
Creatinine, Ser: 0.99
Creatinine, Ser: 1.02
GFR calc Af Amer: 57 — ABNORMAL LOW
GFR calc non Af Amer: 33 — ABNORMAL LOW
GFR calc non Af Amer: 52 — ABNORMAL LOW
GFR calc non Af Amer: 53 — ABNORMAL LOW
GFR calc non Af Amer: 54 — ABNORMAL LOW
GFR calc non Af Amer: >60
Glucose, Bld: 121 — ABNORMAL HIGH
Glucose, Bld: 156 — ABNORMAL HIGH
Glucose, Bld: 169 — ABNORMAL HIGH
Glucose, Bld: 192 — ABNORMAL HIGH
Glucose, Bld: 91
Potassium: 3.2 — ABNORMAL LOW
Potassium: 3.5
Potassium: 3.7
Potassium: 3.9
Potassium: 4.2
Sodium: 136
Sodium: 136
Sodium: 138

## 2011-02-16 LAB — LIPID PANEL
Cholesterol: 130
HDL: 64

## 2011-02-16 LAB — PROTEIN, URINE, 24 HOUR
Collection Interval-UPROT: 24
Protein, 24H Urine: 468 — ABNORMAL HIGH
Urine Total Volume-UPROT: 2600

## 2011-02-16 LAB — PROTIME-INR
INR: 1.2
INR: 1.3
INR: 1.5
INR: 3.2 — ABNORMAL HIGH
Prothrombin Time: 16.6 — ABNORMAL HIGH
Prothrombin Time: 18.8 — ABNORMAL HIGH
Prothrombin Time: 34.4 — ABNORMAL HIGH

## 2011-02-16 LAB — MAGNESIUM: Magnesium: 1.5

## 2011-02-16 LAB — HEPATITIS PANEL, ACUTE
HCV Ab: NEGATIVE
Hep B C IgM: NEGATIVE
Hepatitis B Surface Ag: NEGATIVE

## 2011-02-16 LAB — HEMOGLOBIN A1C: Mean Plasma Glucose: 136

## 2011-02-16 LAB — IMMUNOFIXATION, URINE

## 2011-02-16 LAB — IRON AND TIBC: Iron: 30 — ABNORMAL LOW

## 2011-02-16 LAB — LIPASE, BLOOD: Lipase: 1645 — ABNORMAL HIGH

## 2011-02-16 LAB — AMYLASE: Amylase: 126

## 2011-02-16 LAB — FLECAINIDE LEVEL: Flecainide: 0.21 ug/mL (ref 0.20–1.00)

## 2011-02-16 LAB — TSH: TSH: 0.389

## 2011-02-16 LAB — CK TOTAL AND CKMB (NOT AT ARMC): Relative Index: 0.5

## 2011-02-16 LAB — FERRITIN: Ferritin: 124 (ref 10–291)

## 2011-02-16 LAB — ABO/RH: ABO/RH(D): O POS

## 2011-02-16 LAB — TRIGLYCERIDES: Triglycerides: 106

## 2011-02-16 LAB — POTASSIUM: Potassium: 4

## 2011-02-18 ENCOUNTER — Ambulatory Visit (INDEPENDENT_AMBULATORY_CARE_PROVIDER_SITE_OTHER): Payer: Medicare Other | Admitting: Gastroenterology

## 2011-02-18 ENCOUNTER — Encounter: Payer: Self-pay | Admitting: Gastroenterology

## 2011-02-18 VITALS — BP 106/68 | HR 58 | Temp 97.5°F | Ht 67.0 in | Wt 203.0 lb

## 2011-02-18 DIAGNOSIS — D369 Benign neoplasm, unspecified site: Secondary | ICD-10-CM

## 2011-02-18 NOTE — Patient Instructions (Signed)
We have set you up for a colonoscopy with Dr. Gala Romney.  We will be requesting any recent labs for our records.  We will be in touch with you regarding how to proceed with holding the Coumadin. We will let you know in plenty of time before the procedure.

## 2011-02-18 NOTE — Progress Notes (Addendum)
Primary Care Physician:  [name], MD Primary Gastroenterologist:  Dr. Gala Romney   Chief Complaint  Patient presents with  . Colonoscopy    HPI:   [name] is a pleasant 70 year old female with a history of adenomatous polyps in March 2006 with Dr. Olevia Perches. She is here for a visit prior to surveillance colonoscopy. She denies any abdominal pain, nausea, vomiting, melena or hematochezia. She has no complaints. She sees [facility] in [place], where labs were recently done. Hx of gallstone pancreatitis, anemia. We are requesting the most recent labs both from Dr. Nevada Crane and [facility].  She is maintained on Coumadin and flecainide for afib.    10/2: Updated: received labs from Dr. Nevada Crane in May 2012: normal LFTs. Awaiting anemia panel.  10/15: received updated CBC, anemia panel drawn 10/3. No anemia noted. Ferritin normal at 60. Iron normal.   Past Medical History  Diagnosis Date  . Pancreatitis     biliary  . CVA (cerebral vascular accident)   . A-fib   . Hypercholesterolemia   . DM (diabetes mellitus)   . HTN (hypertension)   . Hypothyroidism   . GERD (gastroesophageal reflux disease)   . S/P endoscopy March 2009    multiple 3-4 mmsessile gastric polyps, benign, singl antral erosions, negative H.pylori, reactive gastropathy ? NSAIDs  . S/P colonoscopy March 2006    Dr. Olevia Perches: per e-chart report: diverticulosis, path adenomatous polyps    Past Surgical History  Procedure Date  . Cholecystectomy   . Shoulder surgery     left    Current Outpatient Prescriptions  Medication Sig Dispense Refill  . atorvastatin (LIPITOR) 40 MG tablet Take 40 mg by mouth daily.        . benazepril (LOTENSIN) 20 MG tablet Take 20 mg by mouth daily.        . calcium carbonate 1250 MG capsule Take 1,800 mg by mouth 2 (two) times daily with a meal.        . citalopram (CELEXA) 20 MG tablet Take 20 mg by mouth daily.        . fenofibrate 160 MG tablet Take 160 mg by mouth daily.        .  fish oil-omega-3 fatty acids 1000 MG capsule Take 2 g by mouth daily.        . Flaxseed Oil OIL Take 2,000 mg by mouth daily.        . flecainide (TAMBOCOR) 100 MG tablet Take 100 mg by mouth 2 (two) times daily.        . fluticasone (FLONASE) 50 MCG/ACT nasal spray Place 2 sprays into the nose daily.        . furosemide (LASIX) 20 MG tablet Take 20 mg by mouth daily.       Marland Kitchen glipiZIDE (GLUCOTROL) 10 MG tablet Take 10 mg by mouth 1 day or 1 dose.       Marland Kitchen HYDROcodone-acetaminophen (VICODIN) 5-500 MG per tablet Take 1 tablet by mouth every 6 (six) hours as needed.        Marland Kitchen levothyroxine (SYNTHROID, LEVOTHROID) 75 MCG tablet Take 75 mcg by mouth daily.        Marland Kitchen omeprazole (PRILOSEC) 40 MG capsule Take 40 mg by mouth daily.        . traMADol (ULTRAM) 50 MG tablet Take 50 mg by mouth every 6 (six) hours as needed.        . warfarin (COUMADIN) 5 MG tablet Take 5 mg by mouth daily. Tuesday and  Saturday take 5 mg Then the others days take 2.5 mg         Allergies as of 02/18/2011 - Review Complete 02/18/2011  Allergen Reaction Noted  . Tetanus toxoids Rash 02/18/2011    Family History  Problem Relation Age of Onset  . Colon cancer Neg Hx     History   Social History  . Marital Status: Legally Separated    Spouse Name: N/A    Number of Children: N/A  . Years of Education: N/A   Occupational History  . Not on file.   Social History Main Topics  . Smoking status: Never Smoker   . Smokeless tobacco: Not on file  . Alcohol Use: No  . Drug Use: No  . Sexually Active: Not on file   Other Topics Concern  . Not on file   Social History Narrative  . No narrative on file    Review of Systems: Gen: Denies any fever, chills, fatigue, weight loss, lack of appetite.  CV: Denies chest pain, heart palpitations, peripheral edema, syncope.  Resp: Denies shortness of breath at rest or with exertion. Denies wheezing or cough.  GI: Denies dysphagia or odynophagia. Denies jaundice, hematemesis,  fecal incontinence. GU : Denies urinary burning, urinary frequency, urinary hesitancy MS: Denies joint pain, muscle weakness, cramps, or limitation of movement.  Derm: Denies rash, itching, dry skin Psych: Denies depression, anxiety, memory loss, and confusion Heme: Denies bruising, bleeding, and enlarged lymph nodes.  Physical Exam: BP 106/68  Pulse 58  Temp(Src) 97.5 F (36.4 C) (Temporal)  Ht 5\' 7"  (1.702 m)  Wt 203 lb (92.08 kg)  BMI 31.79 kg/m2 General:   Alert and oriented. Pleasant and cooperative. Well-nourished and well-developed.  Head:  Normocephalic and atraumatic. Eyes:  Without icterus, sclera clear and conjunctiva pink.  Ears:  Normal auditory acuity. Nose:  No deformity, discharge,  or lesions. Mouth:  No deformity or lesions, oral mucosa pink.  Neck:  Supple, without mass or thyromegaly. Lungs:  Clear to auscultation bilaterally. No wheezes, rales, or rhonchi. No distress.  Heart:  S1, S2 present without murmurs appreciated.  Abdomen:  +BS, soft, non-tender and non-distended. No HSM noted. No guarding or rebound. No masses appreciated.  Rectal:  Deferred  Msk:  Symmetrical without gross deformities. Normal posture. Extremities:  Without clubbing or edema. Neurologic:  Alert and  oriented x4;  grossly normal neurologically. Skin:  Intact without significant lesions or rashes. Cervical Nodes:  No significant cervical adenopathy. Psych:  Alert and cooperative. Normal mood and affect.

## 2011-02-19 ENCOUNTER — Encounter: Payer: Self-pay | Admitting: Gastroenterology

## 2011-02-19 DIAGNOSIS — D369 Benign neoplasm, unspecified site: Secondary | ICD-10-CM | POA: Insufficient documentation

## 2011-02-19 NOTE — Assessment & Plan Note (Addendum)
70 year old female with history of adenomatous polyps on colonoscopy by Dr. Olevia Perches in March 2006, now presenting for surveillance colonoscopy. She is devoid of any lower GI symptoms currently. As of note, she is on Coumadin due to hx of afib. We will be discussing with Dr. Nevada Crane, who manages her Coumadin, the feasibility of holding this for 4 days.  Also, she does have noted in her chart a history of anemia. We are requesting most recent labs from Dr. Nevada Crane and the [employer] [facility] in [place]. (Update 10/15: no anemia, normal ferritin, normal iron).   Proceed with TCS with Dr. Gala Romney in near future: the risks, benefits, and alternatives have been discussed with the patient in detail. The patient states understanding and desires to proceed. Anticipate holding Coumadin X 4 days.  Addendum 10/2: received note from Dr. Nevada Crane who agrees with holding Coumadin X 4 days, but he would like to see her back in the office 1 week after procedure to ensure back to appropriate levels. Afib currently rate-controlled. We will proceed with holding Coumadin X 4 days. Resume Coumadin as soon as possible.

## 2011-02-19 NOTE — Progress Notes (Signed)
Cc to PCP 

## 2011-02-24 ENCOUNTER — Other Ambulatory Visit: Payer: Self-pay | Admitting: Gastroenterology

## 2011-02-24 DIAGNOSIS — Z86010 Personal history of colonic polyps: Secondary | ICD-10-CM

## 2011-02-24 NOTE — Progress Notes (Signed)
PT SCHEDULED FOR TCS ON 03/10/11- SHE WAS INSTRUCTED TO HOLD HER COUMADIN STARTING ON 03/06/11 AND FOLLOW UP WITH DR HALL AFTERWARDS

## 2011-03-09 MED ORDER — SODIUM CHLORIDE 0.45 % IV SOLN
Freq: Once | INTRAVENOUS | Status: AC
  Administered 2011-03-10: 12:00:00 via INTRAVENOUS

## 2011-03-10 ENCOUNTER — Ambulatory Visit (HOSPITAL_COMMUNITY)
Admission: RE | Admit: 2011-03-10 | Discharge: 2011-03-10 | Disposition: A | Discharge status: Home / Self Care | Payer: Medicare Other | Source: Ambulatory Visit | Attending: Internal Medicine | Admitting: Internal Medicine

## 2011-03-10 ENCOUNTER — Encounter (HOSPITAL_COMMUNITY): Payer: Self-pay | Admitting: *Deleted

## 2011-03-10 ENCOUNTER — Encounter (HOSPITAL_COMMUNITY)
Admission: RE | Disposition: A | Discharge status: Home / Self Care | Payer: Self-pay | Source: Ambulatory Visit | Attending: Internal Medicine

## 2011-03-10 DIAGNOSIS — Z860100 Personal history of colon polyps, unspecified: Secondary | ICD-10-CM | POA: Insufficient documentation

## 2011-03-10 DIAGNOSIS — I10 Essential (primary) hypertension: Secondary | ICD-10-CM | POA: Insufficient documentation

## 2011-03-10 DIAGNOSIS — K5730 Diverticulosis of large intestine without perforation or abscess without bleeding: Secondary | ICD-10-CM

## 2011-03-10 DIAGNOSIS — Z79899 Other long term (current) drug therapy: Secondary | ICD-10-CM | POA: Insufficient documentation

## 2011-03-10 DIAGNOSIS — E119 Type 2 diabetes mellitus without complications: Secondary | ICD-10-CM | POA: Insufficient documentation

## 2011-03-10 DIAGNOSIS — E7800 Pure hypercholesterolemia, unspecified: Secondary | ICD-10-CM | POA: Insufficient documentation

## 2011-03-10 DIAGNOSIS — Z7901 Long term (current) use of anticoagulants: Secondary | ICD-10-CM | POA: Insufficient documentation

## 2011-03-10 DIAGNOSIS — Z1211 Encounter for screening for malignant neoplasm of colon: Secondary | ICD-10-CM

## 2011-03-10 HISTORY — PX: COLONOSCOPY: SHX5424

## 2011-03-10 HISTORY — DX: Major depressive disorder, single episode, unspecified: F32.9

## 2011-03-10 HISTORY — DX: Polyp of colon: K63.5

## 2011-03-10 HISTORY — DX: Depression, unspecified: F32.A

## 2011-03-10 SURGERY — COLONOSCOPY
Anesthesia: Moderate Sedation

## 2011-03-10 MED ORDER — MIDAZOLAM HCL 5 MG/5ML IJ SOLN
INTRAMUSCULAR | Status: DC | PRN
  Administered 2011-03-10 (×2): 2 mg via INTRAVENOUS

## 2011-03-10 MED ORDER — STERILE WATER FOR IRRIGATION IR SOLN
Status: DC | PRN
  Administered 2011-03-10: 13:00:00

## 2011-03-10 MED ORDER — MEPERIDINE HCL 100 MG/ML IJ SOLN
INTRAMUSCULAR | Status: DC | PRN
  Administered 2011-03-10: 25 mg
  Administered 2011-03-10: 50 mg

## 2011-03-10 MED ORDER — MIDAZOLAM HCL 5 MG/5ML IJ SOLN
INTRAMUSCULAR | Status: AC
  Filled 2011-03-10: qty 10

## 2011-03-10 MED ORDER — MEPERIDINE HCL 100 MG/ML IJ SOLN
INTRAMUSCULAR | Status: AC
  Filled 2011-03-10: qty 1

## 2011-03-10 NOTE — H&P (Signed)
Diane Emperor, NP  03/09/2011  4:35 PM  Addendum   Primary Care Physician:  Diane Neighbors, MD Primary Gastroenterologist:  Dr. Gala [name]     Chief Complaint   Patient presents with   .  Colonoscopy      HPI:    [name] is a pleasant 70 year old female with a history of adenomatous polyps in March 2006 with Dr. Olevia [name]. She is here for a visit prior to surveillance colonoscopy. She denies any abdominal pain, nausea, vomiting, melena or hematochezia. She has no complaints. She sees [facility] in [place], where labs were recently done. Hx of gallstone pancreatitis, anemia. We are requesting the most recent labs both from Dr. Nevada [name] and [facility].   She is maintained on Coumadin and flecainide for afib.      10/2: Updated: received labs from Dr. Nevada [name] in May 2012: normal LFTs. Awaiting anemia panel.   10/15: received updated CBC, anemia panel drawn 10/3. No anemia noted. Ferritin normal at 60. Iron normal.     Past Medical History   Diagnosis  Date   .  Pancreatitis         biliary   .  CVA (cerebral vascular accident)     .  A-fib     .  Hypercholesterolemia     .  DM (diabetes mellitus)     .  HTN (hypertension)     .  Hypothyroidism     .  GERD (gastroesophageal reflux disease)     .  S/P endoscopy  March 2009       multiple 3-4 mmsessile gastric polyps, benign, singl antral erosions, negative H.pylori, reactive gastropathy ? NSAIDs   .  S/P colonoscopy  March 2006       Dr. Olevia [name]: per e-chart report: diverticulosis, path adenomatous polyps       Past Surgical History   Procedure  Date   .  Cholecystectomy     .  Shoulder surgery         left       Current Outpatient Prescriptions   Medication  Sig  Dispense  Refill   .  atorvastatin (LIPITOR) 40 MG tablet  Take 40 mg by mouth daily.           .  benazepril (LOTENSIN) 20 MG tablet  Take 20 mg by mouth daily.           .  calcium carbonate 1250 MG capsule  Take 1,800 mg by mouth 2 (two) times daily with a meal.            .  citalopram (CELEXA) 20 MG tablet  Take 20 mg by mouth daily.           .  fenofibrate 160 MG tablet  Take 160 mg by mouth daily.           .  fish oil-omega-3 fatty acids 1000 MG capsule  Take 2 g by mouth daily.           .  Flaxseed Oil OIL  Take 2,000 mg by mouth daily.           .  flecainide (TAMBOCOR) 100 MG tablet  Take 100 mg by mouth 2 (two) times daily.           .  fluticasone (FLONASE) 50 MCG/ACT nasal spray  Place 2 sprays into the nose daily.           .  furosemide (LASIX) 20 MG tablet  Take 20 mg by mouth daily.          Marland Kitchen  glipiZIDE (GLUCOTROL) 10 MG tablet  Take 10 mg by mouth 1 day or 1 dose.          Marland Kitchen  HYDROcodone-acetaminophen (VICODIN) 5-500 MG per tablet  Take 1 tablet by mouth every 6 (six) hours as needed.           Marland Kitchen  levothyroxine (SYNTHROID, LEVOTHROID) 75 MCG tablet  Take 75 mcg by mouth daily.           Marland Kitchen  omeprazole (PRILOSEC) 40 MG capsule  Take 40 mg by mouth daily.           .  traMADol (ULTRAM) 50 MG tablet  Take 50 mg by mouth every 6 (six) hours as needed.           .  warfarin (COUMADIN) 5 MG tablet  Take 5 mg by mouth daily. Tuesday and Saturday take 5 mg  Then the others days take 2.5 mg              Allergies as of 02/18/2011 - Review Complete 02/18/2011   Allergen  Reaction  Noted   .  Tetanus toxoids  Rash  02/18/2011       Family History   Problem  Relation  Age of Onset   .  Colon cancer  Neg Hx         History       Social History   .  Marital Status:  Legally Separated       Spouse Name:  N/A       Number of Children:  N/A   .  Years of Education:  N/A       Occupational History   .  Not on file.       Social History Main Topics   .  Smoking status:  Never Smoker    .  Smokeless tobacco:  Not on file   .  Alcohol Use:  No   .  Drug Use:  No   .  Sexually Active:  Not on file       Other Topics  Concern   .  Not on file       Social History Narrative   .  No narrative on file      Review of  Systems: Gen: Denies any fever, chills, fatigue, weight loss, lack of appetite.   CV: Denies chest pain, heart palpitations, peripheral edema, syncope.   Resp: Denies shortness of breath at rest or with exertion. Denies wheezing or cough.   GI: Denies dysphagia or odynophagia. Denies jaundice, hematemesis, fecal incontinence. GU : Denies urinary burning, urinary frequency, urinary hesitancy MS: Denies joint pain, muscle weakness, cramps, or limitation of movement.   Derm: Denies rash, itching, dry skin Psych: Denies depression, anxiety, memory loss, and confusion Heme: Denies bruising, bleeding, and enlarged lymph nodes.   Physical Exam: BP 106/68  Pulse 58  Temp(Src) 97.5 F (36.4 C) (Temporal)  Ht 5\' 7"  (1.702 m)  Wt 203 lb (92.08 kg)  BMI 31.79 kg/m2 General:   Alert and oriented. Pleasant and cooperative. Well-nourished and well-developed.   Head:  Normocephalic and atraumatic. Eyes:  Without icterus, sclera clear and conjunctiva pink.   Ears:  Normal auditory acuity. Nose:  No deformity, discharge,  or lesions. Mouth:  No deformity or lesions, oral mucosa pink.   Neck:  Supple, without mass or thyromegaly. Lungs:  Clear to auscultation bilaterally. No wheezes, rales, or rhonchi. No distress.   Heart:  S1, S2 present without murmurs appreciated.   Abdomen:  +BS, soft, non-tender and non-distended. No HSM noted. No guarding or rebound. No masses appreciated.   Rectal:  Deferred   Msk:  Symmetrical without gross deformities. Normal posture. Extremities:  Without clubbing or edema. Neurologic:  Alert and  oriented x4;  grossly normal neurologically. Skin:  Intact without significant lesions or rashes. Cervical Nodes:  No significant cervical adenopathy. Psych:  Alert and cooperative. Normal mood and affect.         Previous Version  [name]  02/19/2011  4:36 PM  Signed Cc to PCP  [name] [facility]  02/24/2011  3:46 PM  Signed PT SCHEDULED FOR TCS ON 03/10/11-  SHE WAS INSTRUCTED TO HOLD HER COUMADIN STARTING ON 03/06/11 AND FOLLOW UP WITH DR [name] AFTERWARDS          Adenomatous polyps - Diane Emperor, NP  03/09/2011  4:36 PM  Addendum 70 year old female with history of adenomatous polyps on colonoscopy by Dr. Olevia [name] in March 2006, now presenting for surveillance colonoscopy. She is devoid of any lower GI symptoms currently. As of note, she is on Coumadin due to hx of afib. We will be discussing with Dr. Nevada [name], who manages her Coumadin, the feasibility of holding this for 4 days.   Also, she does have noted in her chart a history of anemia. We are requesting most recent labs from Dr. Nevada [name] and the [facility] in [place]. (Update 10/15: no anemia, normal ferritin, normal iron).    Proceed with TCS with Dr. Gala [name] in near future: the risks, benefits, and alternatives have been discussed with the patient in detail. The patient states understanding and desires to proceed. Anticipate holding Coumadin X 4 days.   Addendum 10/2: received note from Dr. Nevada [name] who agrees with holding Coumadin X 4 days, but he would like to see her back in the office 1 week after procedure to ensure back to appropriate levels. Afib currently rate-controlled. We will proceed with holding Coumadin X 4 days. Resume Coumadin as soon as possible.      I have seen & examined the patient prior to the procedure(s) today and reviewed the history and physical/consultation.  There have been no changes.  After consideration of the risks, benefits, alternatives and imponderables, the patient has consented to the procedure(s).

## 2011-03-19 ENCOUNTER — Encounter (HOSPITAL_COMMUNITY): Payer: Self-pay | Admitting: Internal Medicine

## 2011-04-20 NOTE — Progress Notes (Signed)
REVIEWED.  

## 2011-04-22 ENCOUNTER — Emergency Department (HOSPITAL_COMMUNITY)
Admission: EM | Admit: 2011-04-22 | Discharge: 2011-04-23 | Disposition: A | Discharge status: Home / Self Care | Payer: Medicare Other | Attending: Emergency Medicine | Admitting: Emergency Medicine

## 2011-04-22 ENCOUNTER — Encounter (HOSPITAL_COMMUNITY): Payer: Self-pay | Admitting: *Deleted

## 2011-04-22 ENCOUNTER — Emergency Department (HOSPITAL_COMMUNITY): Payer: Medicare Other

## 2011-04-22 DIAGNOSIS — K219 Gastro-esophageal reflux disease without esophagitis: Secondary | ICD-10-CM | POA: Insufficient documentation

## 2011-04-22 DIAGNOSIS — R51 Headache: Secondary | ICD-10-CM | POA: Insufficient documentation

## 2011-04-22 DIAGNOSIS — W19XXXA Unspecified fall, initial encounter: Secondary | ICD-10-CM

## 2011-04-22 DIAGNOSIS — S0003XA Contusion of scalp, initial encounter: Secondary | ICD-10-CM | POA: Insufficient documentation

## 2011-04-22 DIAGNOSIS — F329 Major depressive disorder, single episode, unspecified: Secondary | ICD-10-CM | POA: Insufficient documentation

## 2011-04-22 DIAGNOSIS — Y92009 Unspecified place in unspecified non-institutional (private) residence as the place of occurrence of the external cause: Secondary | ICD-10-CM | POA: Insufficient documentation

## 2011-04-22 DIAGNOSIS — E119 Type 2 diabetes mellitus without complications: Secondary | ICD-10-CM | POA: Insufficient documentation

## 2011-04-22 DIAGNOSIS — Z860100 Personal history of colon polyps, unspecified: Secondary | ICD-10-CM | POA: Insufficient documentation

## 2011-04-22 DIAGNOSIS — I4891 Unspecified atrial fibrillation: Secondary | ICD-10-CM | POA: Insufficient documentation

## 2011-04-22 DIAGNOSIS — E7800 Pure hypercholesterolemia, unspecified: Secondary | ICD-10-CM | POA: Insufficient documentation

## 2011-04-22 DIAGNOSIS — F3289 Other specified depressive episodes: Secondary | ICD-10-CM | POA: Insufficient documentation

## 2011-04-22 DIAGNOSIS — M79609 Pain in unspecified limb: Secondary | ICD-10-CM | POA: Insufficient documentation

## 2011-04-22 DIAGNOSIS — R42 Dizziness and giddiness: Secondary | ICD-10-CM | POA: Insufficient documentation

## 2011-04-22 DIAGNOSIS — IMO0002 Reserved for concepts with insufficient information to code with codable children: Secondary | ICD-10-CM | POA: Insufficient documentation

## 2011-04-22 DIAGNOSIS — T07XXXA Unspecified multiple injuries, initial encounter: Secondary | ICD-10-CM

## 2011-04-22 DIAGNOSIS — W1809XA Striking against other object with subsequent fall, initial encounter: Secondary | ICD-10-CM | POA: Insufficient documentation

## 2011-04-22 DIAGNOSIS — Z7901 Long term (current) use of anticoagulants: Secondary | ICD-10-CM | POA: Insufficient documentation

## 2011-04-22 DIAGNOSIS — S0990XA Unspecified injury of head, initial encounter: Secondary | ICD-10-CM

## 2011-04-22 DIAGNOSIS — Z8673 Personal history of transient ischemic attack (TIA), and cerebral infarction without residual deficits: Secondary | ICD-10-CM | POA: Insufficient documentation

## 2011-04-22 DIAGNOSIS — M25539 Pain in unspecified wrist: Secondary | ICD-10-CM | POA: Insufficient documentation

## 2011-04-22 DIAGNOSIS — S6990XA Unspecified injury of unspecified wrist, hand and finger(s), initial encounter: Secondary | ICD-10-CM | POA: Insufficient documentation

## 2011-04-22 DIAGNOSIS — M25569 Pain in unspecified knee: Secondary | ICD-10-CM | POA: Insufficient documentation

## 2011-04-22 LAB — GLUCOSE, CAPILLARY: Glucose-Capillary: 122 mg/dL — ABNORMAL HIGH (ref 70–99)

## 2011-04-22 MED ORDER — OXYCODONE-ACETAMINOPHEN 5-325 MG PO TABS
2.0000 | ORAL_TABLET | Freq: Once | ORAL | Status: AC
  Administered 2011-04-22: 2 via ORAL
  Filled 2011-04-22: qty 2

## 2011-04-22 NOTE — ED Notes (Signed)
Received report agree with previous assessment

## 2011-04-22 NOTE — ED Notes (Signed)
Pt states was a little dizzy, got her foot caught in comfromter & fell hitting her head on dresser. Complains of left hand, foot & knee pain. Hematoma noted to forehead. Pt denies any LOC.

## 2011-04-22 NOTE — ED Notes (Signed)
Pt reports falling out of bed and into the dresser.  Reports striking left side of head, hitting knee and feeling fingers and toes bend backward.  Left knee presently slightly swollen.  No deformity of fingers or toes.  Able to move digits, limited by pain.  Pulses strong and palpable. Hematoma noted on left side of forehead. Pt reports feeling slightly shaky, and requests blood sugar checked.

## 2011-04-23 ENCOUNTER — Emergency Department (HOSPITAL_COMMUNITY): Payer: Medicare Other

## 2011-04-23 LAB — PROTIME-INR
INR: 2.5 — ABNORMAL HIGH (ref 0.00–1.49)
Prothrombin Time: 27.4 seconds — ABNORMAL HIGH (ref 11.6–15.2)

## 2011-04-23 MED ORDER — OXYCODONE-ACETAMINOPHEN 5-325 MG PO TABS: 1.0000 | ORAL_TABLET | ORAL | Status: AC | PRN

## 2011-04-23 NOTE — ED Notes (Signed)
Crutch education provided

## 2011-04-23 NOTE — ED Provider Notes (Signed)
History    69yf presenting after fall. Was getting out of bed when got foot caught in bed covers. Fell to ground. Struck head on [employer]. No loc. peristent HA since. Fell onto L side and c/o L knee, ankle and hand pain. No acute numbness or tingling or loss of strength. On coumadin. No neck pain. No confusion per pt and friend at bedside.  CSN: [id number] Arrival date & time: 04/22/2011 10:23 PM   First MD Initiated Contact with Patient 04/22/11 2325      Chief Complaint  Patient presents with  . Fall  . Head Injury  . Hand Injury  . Dizziness    (Consider location/radiation/quality/duration/timing/severity/associated sxs/prior treatment) HPI  Past Medical History  Diagnosis Date  . Pancreatitis     biliary  . CVA (cerebral vascular accident)   . Hypercholesterolemia   . DM (diabetes mellitus)   . HTN (hypertension)   . Hypothyroidism   . GERD (gastroesophageal reflux disease)   . S/P endoscopy March 2009    multiple 3-4 mmsessile gastric polyps, benign, singl antral erosions, negative H.pylori, reactive gastropathy ? NSAIDs  . S/P colonoscopy March 2006    Dr. Olevia Perches: per e-chart report: diverticulosis, path adenomatous polyps  . A-fib   . Colon polyps   . Depression     Past Surgical History  Procedure Date  . Cholecystectomy   . Shoulder surgery     left  . Colonoscopy 03/10/2011    Procedure: COLONOSCOPY;  Surgeon: Daneil Dolin, MD;  Location: [facility];  Service: Endoscopy;  Laterality: N/A;  12:45    Family History  Problem Relation Age of Onset  . Colon cancer Neg Hx     History  Substance Use Topics  . Smoking status: Never Smoker   . Smokeless tobacco: Not on file  . Alcohol Use: No    OB History    Grav Para Term Preterm Abortions TAB SAB Ect Mult Living                  Review of Systems   Review of symptoms negative unless otherwise noted in HPI.   Allergies  Penicillins and Tetanus toxoids  Home Medications   Current  Outpatient Rx  Name Route Sig Dispense Refill  . ATORVASTATIN CALCIUM 40 MG PO TABS Oral Take 40 mg by mouth daily.      Marland Kitchen BENAZEPRIL HCL 20 MG PO TABS Oral Take 30 mg by mouth daily.     Marland Kitchen CALCIUM CARBONATE 1250 MG PO CAPS Oral Take 1,250 mg by mouth 2 (two) times daily with a meal.     . CITALOPRAM HYDROBROMIDE 20 MG PO TABS Oral Take 20 mg by mouth daily.      . FENOFIBRATE 160 MG PO TABS Oral Take 160 mg by mouth daily.      Marland Kitchen FLECAINIDE ACETATE 100 MG PO TABS Oral Take 100 mg by mouth 2 (two) times daily.      Marland Kitchen FLUTICASONE PROPIONATE 50 MCG/ACT NA SUSP Nasal Place 2 sprays into the nose daily as needed. For congestion    . FUROSEMIDE 40 MG PO TABS Oral Take 40 mg by mouth daily.      Marland Kitchen GLIPIZIDE ER 10 MG PO TB24 Oral Take 10 mg by mouth daily.      Marland Kitchen HYDROCODONE-ACETAMINOPHEN 5-500 MG PO TABS Oral Take 1 tablet by mouth every 6 (six) hours as needed. For pain    . LEVOTHYROXINE SODIUM 75 MCG PO TABS Oral  Take 75 mcg by mouth daily.      Marland Kitchen OMEPRAZOLE 40 MG PO CPDR Oral Take 40 mg by mouth daily.      . WARFARIN SODIUM 5 MG PO TABS Oral Take 2.5-5 mg by mouth daily. Tuesday and Saturday take 5 mg Then the others days take 2.5 mg      BP 142/59  Pulse 65  Temp(Src) 98.1 F (36.7 C) (Oral)  Resp 20  Ht 5' 7.5" (1.715 m)  Wt 200 lb (90.719 kg)  BMI 30.86 kg/m2  SpO2 98%  Physical Exam  Nursing note and vitals reviewed. Constitutional: She is oriented to person, place, and time. She appears well-developed and well-nourished. No distress.  HENT:  Head: Normocephalic.  Right Ear: External ear normal.  Left Ear: External ear normal.       Superficial abrasions and small hematoma to forehead. No significant bony tenderness.  Eyes: Conjunctivae are normal. Pupils are equal, round, and reactive to light. Right eye exhibits no discharge. Left eye exhibits no discharge.  Neck: Normal range of motion. Neck supple.       No midline spinal tenderness  Cardiovascular: Normal rate, regular  rhythm and normal heart sounds.  Exam reveals no gallop and no friction rub.   No murmur heard. Pulmonary/Chest: Effort normal and breath sounds normal. No respiratory distress.  Abdominal: Soft. She exhibits no distension. There is no tenderness.  Musculoskeletal: Normal range of motion.       No deformity L hand. Mild tenderness across MP joints. Skin intact. Radial/ulnar/median nerves function intact. Sensation grossly intact. nomral sensation to light touch. No significant tenderness anatomic snuffbox. L knee with questionable effusion. No ligamentous laxity appreciated. Mild diffuse tenderness. L foot grossly normal. Neurovascularly intact. Mild tenderness lateral mall.  Neurological: She is alert and oriented to person, place, and time. No cranial nerve deficit. She exhibits normal muscle tone.       Good finger-to-nose bilaterally.  Skin: Skin is warm and dry.  Psychiatric: She has a normal mood and affect. Her behavior is normal. Thought content normal.    ED Course  Procedures (including critical care time)  Labs Reviewed  GLUCOSE, CAPILLARY - Abnormal; Notable for the following:    Glucose-Capillary 122 (*)    All other components within normal limits  PROTIME-INR - Abnormal; Notable for the following:    Prothrombin Time 27.4 (*)    INR 2.50 (*)    All other components within normal limits   Dg Wrist Complete Left  04/23/2011  *RADIOLOGY REPORT*  Clinical Data: Fall with left wrist pain.  LEFT WRIST - COMPLETE 3+ VIEW  Comparison: None.  Findings: No acute fracture or dislocation identified. Degenerative changes present.  Soft tissues are unremarkable.  IMPRESSION: No acute fracture.  Original Report Authenticated By: Azzie Roup, M.D.   Ct Head Wo Contrast  04/23/2011  *RADIOLOGY REPORT*  Clinical Data: Status post fall, with bruising to the left forehead.  Hit forehead on dresser.  Dizziness.  CT HEAD WITHOUT CONTRAST  Technique:  Contiguous axial images were obtained  from the base of the skull through the vertex without contrast.  Comparison: None.  Findings: There is no evidence of acute infarction, mass lesion, or intra- or extra-axial hemorrhage on CT.  A region of chronic encephalomalacia is noted in the right parietal lobe, reflecting remote infarct.  A smaller focus of encephalomalacia is also noted within the left parietal lobe, likely reflecting chronic ischemic change.  Mild periventricular and subcortical white  matter change likely reflects small vessel ischemic microangiopathy.  Mild cerebellar atrophy is noted.  The brainstem and fourth ventricle are within normal limits.  The third and lateral ventricles, and basal ganglia are unremarkable in appearance.  No mass effect or midline shift is seen.  There is no evidence of fracture; visualized osseous structures are unremarkable in appearance.  The orbits are within normal limits. The paranasal sinuses and mastoid air cells are well-aerated.  Soft tissue swelling is noted overlying the frontal calvarium.  IMPRESSION:  1.  No evidence of traumatic intracranial injury or fracture. 2.  Mild soft tissue swelling overlying the frontal calvarium. 3.  Chronic encephalomalacia in the right parietal lobe reflects remote infarct; smaller focus of encephalomalacia in the left parietal lobe likely reflects chronic ischemic change. 4.  Mild small vessel ischemic microangiopathy.  Original Report Authenticated By: Santa Lighter, M.D.   Dg Knee Complete 4 Views Left  04/23/2011  *RADIOLOGY REPORT*  Clinical Data: Fall with left knee pain.  LEFT KNEE - COMPLETE 4+ VIEW  Comparison: None.  Findings: No evidence of acute fracture or joint effusion. Moderate tricompartmental degenerative changes are present at the knee.  No bony lesions.  IMPRESSION: No acute fracture.  Moderate tricompartmental osteoarthritis present.  Original Report Authenticated By: Azzie Roup, M.D.   Dg Hand Complete Left  04/23/2011  *RADIOLOGY  REPORT*  Clinical Data: Fall with left hand injury.  LEFT HAND - COMPLETE 3+ VIEW  Comparison: None.  Findings: No acute fracture or dislocation identified.  Moderate osteoarthritis noted in the digits.  Soft tissues are unremarkable.  IMPRESSION: No acute fracture identified.  Original Report Authenticated By: Azzie Roup, M.D.   Dg Foot Complete Left  04/23/2011  *RADIOLOGY REPORT*  Clinical Data: Fall with left foot pain.  LEFT FOOT - COMPLETE 3+ VIEW  Comparison: None.  Findings: No acute fracture or dislocation.  Mild degenerative changes present.  Soft tissues are unremarkable.  IMPRESSION: No acute fracture.  Original Report Authenticated By: Azzie Roup, M.D.     1. Fall   2. Closed head injury   3. Contusion, multiple sites       MDM  69yF s/p fall. Closed head injury. Imaging negative for acute injury. Nonfocal neuro exam. Re-examination prior to DC without change. No change in mental status per pt and friend. Given says knee feels unstable. Will Place in immobilizer and have fu with ortho. Head injury instrctions discussed. prn pain meds.        Virgel Manifold, MD 04/25/11 [phone]

## 2011-10-12 ENCOUNTER — Other Ambulatory Visit (HOSPITAL_COMMUNITY): Payer: Self-pay | Admitting: Internal Medicine

## 2011-10-12 DIAGNOSIS — M7122 Synovial cyst of popliteal space [Baker], left knee: Secondary | ICD-10-CM

## 2011-10-15 ENCOUNTER — Other Ambulatory Visit (HOSPITAL_COMMUNITY): Payer: Self-pay | Admitting: Internal Medicine

## 2011-10-15 ENCOUNTER — Ambulatory Visit (HOSPITAL_COMMUNITY)
Admission: RE | Admit: 2011-10-15 | Discharge: 2011-10-15 | Disposition: A | Discharge status: Home / Self Care | Payer: Medicare Other | Source: Ambulatory Visit | Attending: Internal Medicine | Admitting: Internal Medicine

## 2011-10-15 DIAGNOSIS — M7122 Synovial cyst of popliteal space [Baker], left knee: Secondary | ICD-10-CM

## 2011-10-15 DIAGNOSIS — M7120 Synovial cyst of popliteal space [Baker], unspecified knee: Secondary | ICD-10-CM | POA: Insufficient documentation

## 2011-10-15 DIAGNOSIS — M25569 Pain in unspecified knee: Secondary | ICD-10-CM | POA: Insufficient documentation

## 2011-11-04 ENCOUNTER — Ambulatory Visit (INDEPENDENT_AMBULATORY_CARE_PROVIDER_SITE_OTHER): Payer: Medicare Other | Admitting: Orthopedic Surgery

## 2011-11-04 ENCOUNTER — Encounter: Payer: Self-pay | Admitting: Orthopedic Surgery

## 2011-11-04 VITALS — BP 106/60 | Ht 67.5 in | Wt 212.0 lb

## 2011-11-04 DIAGNOSIS — IMO0002 Reserved for concepts with insufficient information to code with codable children: Secondary | ICD-10-CM

## 2011-11-04 DIAGNOSIS — M1710 Unilateral primary osteoarthritis, unspecified knee: Secondary | ICD-10-CM

## 2011-11-04 DIAGNOSIS — M1712 Unilateral primary osteoarthritis, left knee: Secondary | ICD-10-CM | POA: Insufficient documentation

## 2011-11-04 DIAGNOSIS — S83249A Other tear of medial meniscus, current injury, unspecified knee, initial encounter: Secondary | ICD-10-CM | POA: Insufficient documentation

## 2011-11-04 NOTE — Progress Notes (Signed)
Patient ID: [name], female   DOB: 25-Mar-1941, 71 y.o.   MRN: [id number] Chief Complaint  Patient presents with  . Knee Pain    left knee pain since Sep 25 2011, injured 03/2011 fell    BP 106/60  Ht 5' 7.5" (1.715 m)  Wt 212 lb (96.163 kg)  BMI 32.71 kg/m2   History of present illness: Consult has been requested by Dr. Meade Maw. The patient complains of left knee pain and then left lower extremity radicular symptoms for one and a half months. The patient fell in November 2012 falling onto both knees and since that time his increasing pain over the last 2 weeks she's developed radicular symptoms in the left lower extremity and gabapentin seems to be controlling that. She has been taking hydrocodone and using Voltaren gel and she has not improved.  The pain in her knees on the medial side is associated with locking and catching the pain is severe when she's walking or getting up and down. It is sharp and dull throbbing and stabbing 8/10 and constant  Her toes get and that seems to be related to her radicular symptoms  Review of systems weight gain palpitations snoring heartburn frequency joint pain changes in skin unsteady gait dizziness anxiety depression easy bruising temperature intolerance and seasonal allergies. The eye review of systems reveals dry eyes.  Medical history as recorded patient has atrial fibrillation and is on warfarin  Physical examination general appearance no deformities Orientation x3 normal Mood and affect normal  Gait and station she is limping favoring the left lower extremity she has mild varus alignment to the knee.  The patient's upper extremities are aligned properly, no joint subluxations are noted. No contractures are noted no atrophy noted in skin is normal other than some bruising from the Coumadin.  The right lower extremity is well aligned without contracture subluxation atrophy or tremor  The left lower chest remedy is notable to have  medial joint line tenderness and a positive Murray sign a joint effusion all ligaments are stable muscle tone is normal skin is intact  Peripheral pulses are intact the patient does have some varicosities in the lower extremities and has some subcutaneous bleeding there is well. She has no lymphadenopathy. Sensation is normal. Reflexes normal. Balance normal.  X-rays from the hospital 4 views of the left knee she has medial compartment gonarthrosis with subchondral sclerosis, cyst of the medial side and patellofemoral diseaseformation and marginal osteophytes with joint space narrowing    Impression  She has been treated nonoperatively for the last month and a half with both pharyngeal gabapentin and hydrocodone and she has not improved  Recommend MRI to evaluate medial meniscus repairpression torn medial meniscus with osteoarthritis of the knee

## 2011-11-04 NOTE — Patient Instructions (Addendum)
You have been scheduled for an MRI scan.  Your insurance company requires advocate precertification prior to scheduling the MRI.  If her MRI scan is not improved we will let you know and make further treatment recommendations according to your insurance's guidelines.   We will schedule you for a number of appointment to review the results and make further treatment recommendations

## 2011-11-06 ENCOUNTER — Telehealth: Payer: Self-pay | Admitting: Radiology

## 2011-11-06 NOTE — Telephone Encounter (Signed)
I called to give the patient her MRI appointment at [facility] on 11-10-11 at 8:45. Patient has Lynda Rainwater, authorization # [id number] and it expires on 12-21-11. Patient will follow up back here for her results.

## 2011-11-10 ENCOUNTER — Ambulatory Visit (HOSPITAL_COMMUNITY)
Admission: RE | Admit: 2011-11-10 | Discharge: 2011-11-10 | Disposition: A | Discharge status: Home / Self Care | Payer: Medicare Other | Source: Ambulatory Visit | Attending: Orthopedic Surgery | Admitting: Orthopedic Surgery

## 2011-11-10 DIAGNOSIS — M2340 Loose body in knee, unspecified knee: Secondary | ICD-10-CM | POA: Insufficient documentation

## 2011-11-10 DIAGNOSIS — M2390 Unspecified internal derangement of unspecified knee: Secondary | ICD-10-CM | POA: Insufficient documentation

## 2011-11-10 DIAGNOSIS — M25569 Pain in unspecified knee: Secondary | ICD-10-CM | POA: Insufficient documentation

## 2011-11-10 DIAGNOSIS — S83249A Other tear of medial meniscus, current injury, unspecified knee, initial encounter: Secondary | ICD-10-CM

## 2011-11-16 ENCOUNTER — Encounter: Payer: Self-pay | Admitting: Orthopedic Surgery

## 2011-11-16 ENCOUNTER — Ambulatory Visit (INDEPENDENT_AMBULATORY_CARE_PROVIDER_SITE_OTHER): Payer: Medicare Other | Admitting: Orthopedic Surgery

## 2011-11-16 VITALS — BP 90/50 | Ht 67.5 in | Wt 212.0 lb

## 2011-11-16 DIAGNOSIS — M179 Osteoarthritis of knee, unspecified: Secondary | ICD-10-CM | POA: Insufficient documentation

## 2011-11-16 DIAGNOSIS — M1710 Unilateral primary osteoarthritis, unspecified knee: Secondary | ICD-10-CM

## 2011-11-16 DIAGNOSIS — M23329 Other meniscus derangements, posterior horn of medial meniscus, unspecified knee: Secondary | ICD-10-CM | POA: Insufficient documentation

## 2011-11-16 DIAGNOSIS — IMO0002 Reserved for concepts with insufficient information to code with codable children: Secondary | ICD-10-CM

## 2011-11-16 DIAGNOSIS — M2340 Loose body in knee, unspecified knee: Secondary | ICD-10-CM | POA: Insufficient documentation

## 2011-11-16 NOTE — Progress Notes (Signed)
Subjective:     Patient ID: [name], female   DOB: 05-01-1941, 71 y.o.   MRN: [id number] Chief Complaint  Patient presents with  . Results    MRI results left knee    HPIProblem #1 LEFT knee pain. Problem #2 LEFT radicular pain. Gabapentin is controlling her radicular symptoms. She had an MRI, which showed torn medial meniscus, severe osteoarthritis, medial compartment, chondromalacia, patella, severe, loose body. However, the patient is improved, and would like to wait on any surgery as the pain has gotten to where it is tolerable   Review of Systems no mechanical symptoms     Objective:   Physical Exam   MRI review and report reviewed computer with disc. Findings indicate, as noted    Assessment:         Plan:     The patient's symptoms have improved, and she would like to hold off on any surgery. We have discussed that if her symptoms get worse, then we would perform arthroscopic surgery to remove the loose body and debris. The joint, as well as the medial meniscus

## 2011-11-16 NOTE — Patient Instructions (Signed)
Call if pain gets worse

## 2011-12-20 ENCOUNTER — Emergency Department (HOSPITAL_COMMUNITY)
Admission: EM | Admit: 2011-12-20 | Discharge: 2011-12-20 | Disposition: A | Discharge status: Home / Self Care | Payer: Medicare Other | Attending: Emergency Medicine | Admitting: Emergency Medicine

## 2011-12-20 ENCOUNTER — Encounter (HOSPITAL_COMMUNITY): Payer: Self-pay | Admitting: *Deleted

## 2011-12-20 DIAGNOSIS — I4891 Unspecified atrial fibrillation: Secondary | ICD-10-CM | POA: Insufficient documentation

## 2011-12-20 DIAGNOSIS — Z8673 Personal history of transient ischemic attack (TIA), and cerebral infarction without residual deficits: Secondary | ICD-10-CM | POA: Insufficient documentation

## 2011-12-20 DIAGNOSIS — M79609 Pain in unspecified limb: Secondary | ICD-10-CM | POA: Insufficient documentation

## 2011-12-20 DIAGNOSIS — K219 Gastro-esophageal reflux disease without esophagitis: Secondary | ICD-10-CM | POA: Insufficient documentation

## 2011-12-20 DIAGNOSIS — E039 Hypothyroidism, unspecified: Secondary | ICD-10-CM | POA: Insufficient documentation

## 2011-12-20 DIAGNOSIS — I82409 Acute embolism and thrombosis of unspecified deep veins of unspecified lower extremity: Secondary | ICD-10-CM

## 2011-12-20 DIAGNOSIS — I10 Essential (primary) hypertension: Secondary | ICD-10-CM | POA: Insufficient documentation

## 2011-12-20 DIAGNOSIS — M79669 Pain in unspecified lower leg: Secondary | ICD-10-CM

## 2011-12-20 DIAGNOSIS — E7800 Pure hypercholesterolemia, unspecified: Secondary | ICD-10-CM | POA: Insufficient documentation

## 2011-12-20 DIAGNOSIS — E119 Type 2 diabetes mellitus without complications: Secondary | ICD-10-CM | POA: Insufficient documentation

## 2011-12-20 MED ORDER — HYDROCODONE-ACETAMINOPHEN 5-500 MG PO TABS: 1.0000 | ORAL_TABLET | Freq: Four times a day (QID) | ORAL | Status: AC | PRN

## 2011-12-20 NOTE — ED Notes (Signed)
Discharge instructions reviewed with pt, questions answered. Pt verbalized understanding.  

## 2011-12-20 NOTE — ED Provider Notes (Signed)
History  This chart was scribed for Diane Speak, MD by Lovena Le Day. This patient was seen in room APA09/APA09 and the patient's care was started at 1244.   CSN: [id number]  Arrival date & time 12/20/11  1244   First MD Initiated Contact with Patient 12/20/11 1608      Chief Complaint  Patient presents with  . Leg Pain    Patient is a 71 y.o. female presenting with leg pain. The history is provided by the patient. No language interpreter was used.  Leg Pain  The incident occurred yesterday. The incident occurred at home. There was no injury mechanism. The pain is present in the left leg. The pain is moderate. The pain has been worsening since onset. Associated symptoms include numbness. She reports no foreign bodies present. She has tried nothing for the symptoms.   [name] is a 71 y.o. female who presents to the Emergency Department complaining of constant worsening right calf pain since yesterday and was worse this AM. She states the pain radiates from the back of her right calf to the top of her right foot with some numbness also. Her associated symptoms are numbness, and pain; she states good ROM in her toes. She denies any previous episodes similar to today. She states that she is currently on coumadin for history of A-fib and had her INR checked last week to be 4.2 and therapeutic. She denies any history of blood clots and has recently been off of her feet more preparing for an upcoming left knee surgery. She denies any other injuries or recent illnesses.  Past Medical History  Diagnosis Date  . Pancreatitis     biliary  . CVA (cerebral vascular accident)   . Hypercholesterolemia   . DM (diabetes mellitus)   . HTN (hypertension)   . Hypothyroidism   . GERD (gastroesophageal reflux disease)   . S/P endoscopy March 2009    multiple 3-4 mmsessile gastric polyps, benign, singl antral erosions, negative H.pylori, reactive gastropathy ? NSAIDs  . S/P colonoscopy March 2006   Dr. Olevia Perches: per e-chart report: diverticulosis, path adenomatous polyps  . A-fib   . Colon polyps   . Depression     Past Surgical History  Procedure Date  . Cholecystectomy   . Shoulder surgery     left  . Colonoscopy 03/10/2011    Procedure: COLONOSCOPY;  Surgeon: Daneil Dolin, MD;  [facility];  Service: Endoscopy;  Laterality: N/A;  12:45    Family History  Problem Relation Age of Onset  . Colon cancer Neg Hx   . Heart disease    . Arthritis    . Lung disease    . Diabetes    . Kidney disease      History  Substance Use Topics  . Smoking status: Never Smoker   . Smokeless tobacco: Not on file  . Alcohol Use: No    OB History    Grav Para Term Preterm Abortions TAB SAB Ect Mult Living                  Review of Systems  Constitutional: Negative for fever.  HENT: Negative for congestion and rhinorrhea.   Respiratory: Negative for cough and shortness of breath.   Cardiovascular: Negative for chest pain.  Gastrointestinal: Negative for vomiting, abdominal pain, diarrhea and abdominal distention.  Neurological: Positive for numbness.  All other systems reviewed and are negative.  A complete 10 system review of systems was obtained  and all systems are negative except as noted in the HPI and PMH.    Allergies  Other; Penicillins; and Tetanus toxoids  Home Medications   Current Outpatient Rx  Name Route Sig Dispense Refill  . ATORVASTATIN CALCIUM 40 MG PO TABS Oral Take 40 mg by mouth daily.      Marland Kitchen BENAZEPRIL HCL 20 MG PO TABS Oral Take 30 mg by mouth daily.     Marland Kitchen CALCIUM CARBONATE 1250 MG PO CAPS Oral Take 1,250 mg by mouth 2 (two) times daily with a meal.     . CITALOPRAM HYDROBROMIDE 20 MG PO TABS Oral Take 20 mg by mouth daily.      Marland Kitchen DICLOFENAC SODIUM 1 % TD GEL Topical Apply 1 application topically 4 (four) times daily.    Marland Kitchen FLECAINIDE ACETATE 100 MG PO TABS Oral Take 100 mg by mouth 2 (two) times daily.      Marland Kitchen FLUTICASONE PROPIONATE 50  MCG/ACT NA SUSP Nasal Place 2 sprays into the nose daily as needed. For congestion    . FUROSEMIDE 40 MG PO TABS Oral Take 40 mg by mouth daily.      Marland Kitchen GABAPENTIN 100 MG PO CAPS Oral Take 100 mg by mouth 3 (three) times daily.    Marland Kitchen GLIPIZIDE ER 10 MG PO TB24 Oral Take 10 mg by mouth daily.      Marland Kitchen HYDROCODONE-ACETAMINOPHEN 5-500 MG PO TABS Oral Take 1 tablet by mouth every 6 (six) hours as needed. For pain    . LEVOTHYROXINE SODIUM 75 MCG PO TABS Oral Take 75 mcg by mouth daily.      Marland Kitchen OMEPRAZOLE 40 MG PO CPDR Oral Take 40 mg by mouth daily.      . WARFARIN SODIUM 5 MG PO TABS Oral Take 2.5-5 mg by mouth daily. Patient takes 1/2 tablet (2.5mg )on Thursday and 1 tablet(5mg ) all other days      Triage Vitals: BP 150/62  Pulse 58  Temp 98.1 F (36.7 C)  Resp 18  Ht 5' 7.5" (1.715 m)  Wt 200 lb (90.719 kg)  BMI 30.86 kg/m2  SpO2 100%  Physical Exam  Nursing note and vitals reviewed. Constitutional: She is oriented to person, place, and time. She appears well-developed and well-nourished. No distress.  HENT:  Head: Normocephalic and atraumatic.  Eyes: Conjunctivae and EOM are normal.  Neck: Normal range of motion. Neck supple. No tracheal deviation present.  Cardiovascular: Normal rate, regular rhythm, normal heart sounds and intact distal pulses.   Pulmonary/Chest: Effort normal.  Musculoskeletal: Normal range of motion. She exhibits tenderness (Tender to palpation right medial calf, no palpable abnormality. No distal swelling.).  Neurological: She is alert and oriented to person, place, and time.  Skin: Skin is warm and dry.  Psychiatric: She has a normal mood and affect. Her behavior is normal.   DIAGNOSTIC STUDIES: Oxygen Saturation is 99% on room air, normal by my interpretation.     ED Course  Procedures (including critical care time) DIAGNOSTIC STUDIES: Oxygen Saturation is 99% on room air, normal by my interpretation.    COORDINATION OF CARE: At 414 PM Discussed treatment  plan with patient which includes protime-INR and to return to ED for right leg ultrasound tomorrow. Patient agrees.    Labs Reviewed  PROTIME-INR   No results found.   No diagnosis found.    MDM  The patient presents with pain in the calf that I suspect is musculoskeletal.  She is already on coumadin for afib and  her inr today is therapeutic.  I will discharge her and have her return tomorrow for an ultrasound to rule out dvt or baker's cyst.    I personally performed the services described in this documentation, which was scribed in my presence. The recorded information has been reviewed and considered.           Diane Speak, MD 12/20/11 1705

## 2011-12-20 NOTE — ED Notes (Signed)
Pt states that she has problems with her legs hurting but right lower leg started hurting in the calf area yesterday, pain is associated with increased swelling to right lower leg,

## 2011-12-21 ENCOUNTER — Ambulatory Visit (HOSPITAL_COMMUNITY)
Admit: 2011-12-21 | Discharge: 2011-12-21 | Disposition: A | Discharge status: Home / Self Care | Payer: Medicare Other | Source: Ambulatory Visit | Attending: Emergency Medicine | Admitting: Emergency Medicine

## 2011-12-21 DIAGNOSIS — I4891 Unspecified atrial fibrillation: Secondary | ICD-10-CM | POA: Insufficient documentation

## 2011-12-21 DIAGNOSIS — I82409 Acute embolism and thrombosis of unspecified deep veins of unspecified lower extremity: Secondary | ICD-10-CM

## 2011-12-21 DIAGNOSIS — M7989 Other specified soft tissue disorders: Secondary | ICD-10-CM | POA: Insufficient documentation

## 2011-12-21 DIAGNOSIS — M79609 Pain in unspecified limb: Secondary | ICD-10-CM | POA: Insufficient documentation

## 2012-01-20 ENCOUNTER — Ambulatory Visit: Payer: Medicare Other | Admitting: Orthopedic Surgery

## 2012-01-27 ENCOUNTER — Ambulatory Visit: Payer: Medicare Other | Admitting: Cardiology

## 2012-01-28 ENCOUNTER — Ambulatory Visit: Payer: Medicare Other | Admitting: Orthopedic Surgery

## 2012-02-04 ENCOUNTER — Other Ambulatory Visit: Payer: Self-pay | Admitting: Adult Health

## 2012-02-04 ENCOUNTER — Inpatient Hospital Stay (HOSPITAL_COMMUNITY)
Admission: EM | Admit: 2012-02-04 | Discharge: 2012-02-06 | DRG: 244 | Disposition: A | Discharge status: Home / Self Care | Payer: Medicare Other | Source: Other Acute Inpatient Hospital | Attending: Cardiology | Admitting: Cardiology

## 2012-02-04 ENCOUNTER — Ambulatory Visit (INDEPENDENT_AMBULATORY_CARE_PROVIDER_SITE_OTHER): Payer: Medicare Other | Admitting: Adult Health

## 2012-02-04 ENCOUNTER — Encounter: Payer: Self-pay | Admitting: Adult Health

## 2012-02-04 ENCOUNTER — Encounter (HOSPITAL_COMMUNITY): Payer: Self-pay | Admitting: *Deleted

## 2012-02-04 VITALS — BP 80/52 | HR 44 | Ht 67.0 in | Wt 214.0 lb

## 2012-02-04 DIAGNOSIS — Z8673 Personal history of transient ischemic attack (TIA), and cerebral infarction without residual deficits: Secondary | ICD-10-CM

## 2012-02-04 DIAGNOSIS — I4891 Unspecified atrial fibrillation: Secondary | ICD-10-CM | POA: Insufficient documentation

## 2012-02-04 DIAGNOSIS — Z860100 Personal history of colon polyps, unspecified: Secondary | ICD-10-CM

## 2012-02-04 DIAGNOSIS — K219 Gastro-esophageal reflux disease without esophagitis: Secondary | ICD-10-CM | POA: Diagnosis present

## 2012-02-04 DIAGNOSIS — I498 Other specified cardiac arrhythmias: Secondary | ICD-10-CM

## 2012-02-04 DIAGNOSIS — I129 Hypertensive chronic kidney disease with stage 1 through stage 4 chronic kidney disease, or unspecified chronic kidney disease: Secondary | ICD-10-CM | POA: Diagnosis present

## 2012-02-04 DIAGNOSIS — R001 Bradycardia, unspecified: Secondary | ICD-10-CM

## 2012-02-04 DIAGNOSIS — E119 Type 2 diabetes mellitus without complications: Secondary | ICD-10-CM | POA: Diagnosis present

## 2012-02-04 DIAGNOSIS — Z887 Allergy status to serum and vaccine status: Secondary | ICD-10-CM

## 2012-02-04 DIAGNOSIS — N189 Chronic kidney disease, unspecified: Secondary | ICD-10-CM | POA: Diagnosis present

## 2012-02-04 DIAGNOSIS — Z7901 Long term (current) use of anticoagulants: Secondary | ICD-10-CM

## 2012-02-04 DIAGNOSIS — Z23 Encounter for immunization: Secondary | ICD-10-CM

## 2012-02-04 DIAGNOSIS — Z79899 Other long term (current) drug therapy: Secondary | ICD-10-CM

## 2012-02-04 DIAGNOSIS — F329 Major depressive disorder, single episode, unspecified: Secondary | ICD-10-CM | POA: Diagnosis present

## 2012-02-04 DIAGNOSIS — F3289 Other specified depressive episodes: Secondary | ICD-10-CM | POA: Diagnosis present

## 2012-02-04 DIAGNOSIS — E7800 Pure hypercholesterolemia, unspecified: Secondary | ICD-10-CM | POA: Diagnosis present

## 2012-02-04 DIAGNOSIS — E039 Hypothyroidism, unspecified: Secondary | ICD-10-CM | POA: Diagnosis present

## 2012-02-04 HISTORY — DX: Bradycardia, unspecified: R00.1

## 2012-02-04 HISTORY — DX: Chronic kidney disease, unspecified: N18.9

## 2012-02-04 LAB — CBC WITH DIFFERENTIAL/PLATELET
Basophils Relative: 1 % (ref 0–1)
HCT: 34.9 % — ABNORMAL LOW (ref 36.0–46.0)
Hemoglobin: 11.1 g/dL — ABNORMAL LOW (ref 12.0–15.0)
Lymphocytes Relative: 35 % (ref 12–46)
Lymphs Abs: 1.9 10*3/uL (ref 0.7–4.0)
MCHC: 31.8 g/dL (ref 30.0–36.0)
Monocytes Absolute: 0.2 10*3/uL (ref 0.1–1.0)
Monocytes Relative: 4 % (ref 3–12)
Neutro Abs: 3.1 10*3/uL (ref 1.7–7.7)
Neutrophils Relative %: 58 % (ref 43–77)
RBC: 3.61 MIL/uL — ABNORMAL LOW (ref 3.87–5.11)
WBC: 5.4 10*3/uL (ref 4.0–10.5)

## 2012-02-04 LAB — APTT: aPTT: 43 seconds — ABNORMAL HIGH (ref 24–37)

## 2012-02-04 LAB — TSH: TSH: 0.418 u[IU]/mL (ref 0.350–4.500)

## 2012-02-04 LAB — COMPREHENSIVE METABOLIC PANEL
Albumin: 3.4 g/dL — ABNORMAL LOW (ref 3.5–5.2)
Alkaline Phosphatase: 72 U/L (ref 39–117)
BUN: 47 mg/dL — ABNORMAL HIGH (ref 6–23)
CO2: 21 mEq/L (ref 19–32)
Chloride: 111 mEq/L (ref 96–112)
Creatinine, Ser: 1.52 mg/dL — ABNORMAL HIGH (ref 0.50–1.10)
GFR calc non Af Amer: 34 mL/min — ABNORMAL LOW (ref >90)
Potassium: 4.9 mEq/L (ref 3.5–5.1)
Total Bilirubin: 0.2 mg/dL — ABNORMAL LOW (ref 0.3–1.2)

## 2012-02-04 LAB — PROTIME-INR: INR: 3.38 — ABNORMAL HIGH (ref 0.00–1.49)

## 2012-02-04 LAB — GLUCOSE, CAPILLARY: Glucose-Capillary: 84 mg/dL (ref 70–99)

## 2012-02-04 MED ORDER — ASPIRIN 300 MG RE SUPP
300.0000 mg | RECTAL | Status: AC
  Filled 2012-02-04: qty 1

## 2012-02-04 MED ORDER — ASPIRIN EC 81 MG PO TBEC
81.0000 mg | DELAYED_RELEASE_TABLET | Freq: Every day | ORAL | Status: DC
  Filled 2012-02-04: qty 1

## 2012-02-04 MED ORDER — ACETAMINOPHEN 325 MG PO TABS: 650.0000 mg | ORAL_TABLET | ORAL | Status: DC | PRN

## 2012-02-04 MED ORDER — ONDANSETRON HCL 4 MG/2ML IJ SOLN: 4.0000 mg | Freq: Four times a day (QID) | INTRAMUSCULAR | Status: DC | PRN

## 2012-02-04 MED ORDER — ASPIRIN 81 MG PO CHEW
324.0000 mg | CHEWABLE_TABLET | ORAL | Status: AC
  Administered 2012-02-04: 324 mg via ORAL
  Filled 2012-02-04: qty 4

## 2012-02-04 MED ORDER — NITROGLYCERIN 0.4 MG SL SUBL: 0.4000 mg | SUBLINGUAL_TABLET | SUBLINGUAL | Status: DC | PRN

## 2012-02-04 MED ORDER — INFLUENZA VIRUS VACC SPLIT PF IM SUSP
0.5000 mL | INTRAMUSCULAR | Status: AC
  Filled 2012-02-04: qty 0.5

## 2012-02-04 NOTE — Progress Notes (Addendum)
H& P  History and Physical  Primary [place], MD  HPI: Patient ID: [name] MRN: [id number] DOB/AGE: 09-15-1940 71 y.o.  Primary [place], [place] Primary Cardiologist: Jacqulyn Ducking Active Problems: Symptomatic Bradycardia Atrial Fibrillation  HPI: [name] is a 71 year old patient of Dr. Lattie Haw who has been lost to followup since 2009 and is normally seen by Dr. Nevada Crane her primary care physician. He follows her for hypothyroidism with medication adjustments. She states that she has had her labs completed within the last couple of weeks and were all found to be normal per patient.  She has a long-standing history of atrial fibrillation managed with Coumadin and Flecainide, along with hypothyroidism, GERD, diabetes, and a history of a CVA. She is currently not on any rate reducing medications. She is on flecainide 100 mg twice a day. She states that she has had a cardiac catheterization in 1995, but no further ischemic testing since that time.   Review of prior records does show an echocardiogram completed in 2009 revealing normal LV function with an EF of 60-65% there were no wall motion abnormalities at that time.    She has had progressive dizziness with position change, and lengthy sitting. She has been having some mild chest discomfort, and dyspnea on exertion. She is also complaining of leg pain more on the right calf than on the left with varicosities becoming more prominent.      The patient was driving yesterday and had a near syncopal episode where "everything went white" and she was unable to read road signs. She presented to her primary care physician Dr. Nevada Crane and was found to be hypotensive, and bradycardic. Dr. Nevada Crane spoke with Dr. Lattie Haw on the phone, and an appointment was scheduled for our [facility] office today.      In the office today she is feeling very lightheaded and dizzy, orthostatics were completed with a lying blood  pressure 82/50, sitting 80/48, standing 76/48 with a heart rate between 48 and 53 beats per minute. EKG reveals junctional rhythm with PACs rate of 48 beats per minute.   I have spoken with Dr. Virl Axe via phone at the [facility] office, given in him the patient's history and assessment, and also with Dr. Domenic Polite today. They have recommended admission to [facility] for further evaluation and treatment.   The patient has taken all of her daily medications today. She states in Dr. Juel Burrow office her INR was 3.3.  Review of systems complete and found to be negative unless listed above.  Past Medical History  Diagnosis Date  . Pancreatitis     biliary  . CVA (cerebral vascular accident)   . Hypercholesterolemia   . DM (diabetes mellitus)   . HTN (hypertension)   . Hypothyroidism   . GERD (gastroesophageal reflux disease)   . S/P endoscopy March 2009    multiple 3-4 mmsessile gastric polyps, benign, singl antral erosions, negative H.pylori, reactive gastropathy ? NSAIDs  . S/P colonoscopy March 2006    Dr. Olevia Perches: per e-chart report: diverticulosis, path adenomatous polyps  . A-fib   . Colon polyps   . Depression     Family History  Problem Relation Age of Onset  . Colon cancer Neg Hx   . Heart disease    . Arthritis    . Lung disease    . Diabetes    . Kidney disease      History   Social History  . Marital Status: Legally Separated  Spouse Name: N/A    Number of Children: N/A  . Years of Education: N/A   Occupational History  . Not on file.   Social History Main Topics  . Smoking status: Never Smoker   . Smokeless tobacco: Not on file  . Alcohol Use: No  . Drug Use: No  . Sexually Active: Not on file   Other Topics Concern  . Not on file   Social History Narrative  . No narrative on file    Past Surgical History  Procedure Date  . Cholecystectomy   . Shoulder surgery     left  . Colonoscopy 03/10/2011    Procedure: COLONOSCOPY;  Surgeon:  Daneil Dolin, MD;  [facility];  Service: Endoscopy;  Laterality: N/A;  12:45      Physical Exam: General: Well developed, well nourished, in no acute distress Head: Eyes PERRLA, No xanthomas.   Normal cephalic and atramatic  Lungs: Clear bilaterally to auscultation and percussion. Heart: HRRR S1 S2, bradycardic with soft 1/6 systolic murmur.  Pulses are 2+ & equal.            No carotid bruit. No JVD.  No abdominal bruits. No femoral bruits. Abdomen: Bowel sounds are positive, abdomen soft and non-tender without masses or                  Hernia's noted. Msk:  Back normal, normal gait. Normal strength and tone for age. Extremities: No clubbing, cyanosis or edema.  DP +1 Neuro: Alert and oriented X 3. Forgetful and lightheaded. Psych:  Good affect, responds appropriately   Labs:   Lab Results  Component Value Date   WBC 7.7 08/21/2010   HGB 11.7* 08/21/2010   HCT 36.1 08/21/2010   MCV 94.3 08/21/2010   PLT 158 08/21/2010   No results found for this basename: NA,K,CL,CO2,BUN,CREATININE,CALCIUM,LABALBU,PROT,BILITOT,ALKPHOS,ALT,AST,GLUCOSE in the last 168 hours Lab Results  Component Value Date   CKTOTAL 266* 08/10/2007   CKMB 1.4 08/10/2007    Lab Results  Component Value Date   CHOL  Value: 130        ATP III CLASSIFICATION:  <200     mg/dL   Desirable  200-239  mg/dL   Borderline High  >=240    mg/dL   High 08/08/2007   Lab Results  Component Value Date   HDL 64 08/08/2007   Lab Results  Component Value Date   LDLCALC  Value: 57        Total Cholesterol/HDL:CHD Risk Coronary Heart Disease Risk Table                     Men   Women  1/2 Average Risk   3.4   3.3 08/08/2007   Lab Results  Component Value Date   TRIG 45 08/08/2007   TRIG 106 08/07/2007   Lab Results  Component Value Date   CHOLHDL 2.0 08/08/2007   No results found for this basename: LDLDIRECT       Review of systems complete and found to be negative unless listed above  Past Medical History    Diagnosis Date  . Pancreatitis     biliary  . CVA (cerebral vascular accident)   . Hypercholesterolemia   . DM (diabetes mellitus)   . HTN (hypertension)   . Hypothyroidism   . GERD (gastroesophageal reflux disease)   . S/P endoscopy March 2009    multiple 3-4 mmsessile gastric polyps, benign, singl antral erosions, negative H.pylori, reactive  gastropathy ? NSAIDs  . S/P colonoscopy March 2006    Dr. Olevia Perches: per e-chart report: diverticulosis, path adenomatous polyps  . A-fib   . Colon polyps   . Depression     Family History  Problem Relation Age of Onset  . Colon cancer Neg Hx   . Heart disease    . Arthritis    . Lung disease    . Diabetes    . Kidney disease      History   Social History  . Marital Status: Legally Separated    Spouse Name: N/A    Number of Children: N/A  . Years of Education: N/A   Occupational History  . Not on file.   Social History Main Topics  . Smoking status: Never Smoker   . Smokeless tobacco: Not on file  . Alcohol Use: No  . Drug Use: No  . Sexually Active: Not on file   Other Topics Concern  . Not on file   Social History Narrative  . No narrative on file    Past Surgical History  Procedure Date  . Cholecystectomy   . Shoulder surgery     left  . Colonoscopy 03/10/2011    Procedure: COLONOSCOPY;  Surgeon: Daneil Dolin, MD;  [facility];  Service: Endoscopy;  Laterality: N/A;  12:45    ASSESSMENT AND PLAN:  1. Sympomatic Bradycardia: She has been having progressive dyspnea on exertion lightheadedness and presyncope. She has reported this to her primary care physician Dr. Nevada Crane who has referred her to [place] today. She is hypotensive, orthostatic, but without complaints of chest pain. I have spoken with Dr. Virl Axe M.D. a phone at the [facility] office who recommends admission to step down. It is noted that the patient has taken her Coumadin, and will need to have this held on admission. I have explained this  to the patient who verbalizes understanding and is willing to be admitted. I have called Dr. Aundra Dubin, DOD, to make him aware of the patient's transfer and admission. Dr. Domenic Polite has reviewed the patient's history, examined the patient, and spoken with her as well.  2. Arial fibrillation: Will not restart flecainide as she may be having sinus node dysfunction. We will also hold Coumadin. We will discontinue benazepril. Per Dr. Domenic Polite IV fluids will be started, and [facility] has been notified.  Signed: Jory Sims 02/04/2012, 1:05 PM Co-Sign MD   Attending note:  Patient seen and examined. Reviewed records and discussed with Ms. Diane Stigler NP. Patient has history of atrial fibrillation, managed with flecainide and Coumadin, seen by Dr. Lattie Haw in the past. She states that since April of this year she has felt more fatigued in general, progressively more short of breath with activity, and within the last week she has been much more lightheaded with episodes of presyncope as outlined. She does not describe frank chest pain, but generally feels that it is difficult to take a breath, typically at night time. She does report some mild lower extremity edema and venous prominence particularly in the right leg, although states she had a recent Doppler study done that did not demonstrate any DVT or Baker's cyst on that side. She has also been on Coumadin, reportedly therapeutic recently.  She is noted to be hypotensive in the office today, systolic blood pressure around 80. She is bradycardic with heart rates in the 40s, apparent junctional rhythm with atrial bigeminy on ECG, presumably with underlying sinus node dysfunction. QRS is 108 ms.  Ms. Purcell Nails discussed the patient with Dr. Caryl Comes by phone. Our plan at this time is to have an IV placed with institution of saline, and arrange transfer via [facility] to the step down unit at [facility], for formal EP consultation and further evaluation. Full battery  of labs is to be sent including thyroid studies and cardiac markers. Echocardiogram will also be obtained.  Satira Sark, M.D., F.A.C.C.

## 2012-02-04 NOTE — Progress Notes (Signed)
1230 pm - Started IV #20 ga in LFA. NS initiated @ 100 cc/hr. Site clean, dry and intact.

## 2012-02-04 NOTE — Progress Notes (Signed)
Physician Discharge Summary  Patient ID: [name] MRN: [id number] DOB/AGE: 1940/09/08 71 y.o. Admit date: (Not on file)  [place], [place] Primary Cardiologist: Jacqulyn Ducking Active Problems:  * No active hospital problems. *   HPI: [name] is a 71 year old patient of Dr. Lattie Haw, who has been lost to followup since 2009  who has been lost to followup and is normally seen by Dr. Nevada Crane her primary care physician. He follows her for hypothyroidism with medication adjustments. She states that she has had her labs completed within the last couple of weeks and were all found to be normal per patient.     She has a long-standing history of atrial fibrillation on Coumadin along with hypothyroidism, GERD, diabetes, is only me, and a history of a CVA. She is currently not on any rate reducing medications. She is all on flecainide 100 mg twice a day. She states that she has had a cardiac catheterization in 1995, but no further ischemic testing since that time. Review of prior records does show an echocardiogram completed in 2009 revealing normal LV function with an EF of 60-65% there were no wall motion abnormalities at that time.   She has had progressive dizziness with position change, and lengthy sitting. She has been having some mild chest discomfort, and dyspnea on exertion. She is also complaining of leg pain more on the right calf than on the left with varicosities becoming more prominent.     The patient was driving yesterday and had a near syncopal episode where "everything went white" and she was unable to read road signs. She presented to her primary care physician Dr. Nevada Crane and was found to be hypotensive, and bradycardic. Dr. Nevada Crane spoke with Dr. Lattie Haw on the phone, and an appointment was scheduled for our [facility] office today.     In the office today she is feeling very lightheaded and dizzy, orthostatics were completed with a lying blood pressure 82/50,  sitting 80/48, standing 76/48 with a heart rate between 48 and 53 beats per minute. EKG reveals junctional rhythm with PACs rate of 48 beats per minute. EKG was over read by Dr. Domenic Polite who is on site in the regional office today.    I have spoken with Dr. Virl Axe via phone at the [facility] office, given in him the patient's history and he recommends admission to [facility] hospital for further evaluation and treatment.   The patient has taken all of her daily medications today. She states in Dr. Juel Burrow office her INR was 3.3.  Review of systems complete and found to be negative unless listed above  Past Medical History  Diagnosis Date  . Pancreatitis     biliary  . CVA (cerebral vascular accident)   . Hypercholesterolemia   . DM (diabetes mellitus)   . HTN (hypertension)   . Hypothyroidism   . GERD (gastroesophageal reflux disease)   . S/P endoscopy March 2009    multiple 3-4 mmsessile gastric polyps, benign, singl antral erosions, negative H.pylori, reactive gastropathy ? NSAIDs  . S/P colonoscopy March 2006    Dr. Olevia Perches: per e-chart report: diverticulosis, path adenomatous polyps  . A-fib   . Colon polyps   . Depression     Family History  Problem Relation Age of Onset  . Colon cancer Neg Hx   . Heart disease    . Arthritis    . Lung disease    . Diabetes    . Kidney disease  History   Social History  . Marital Status: Legally Separated    Spouse Name: N/A    Number of Children: N/A  . Years of Education: N/A   Occupational History  . Not on file.   Social History Main Topics  . Smoking status: Never Smoker   . Smokeless tobacco: Not on file  . Alcohol Use: No  . Drug Use: No  . Sexually Active: Not on file   Other Topics Concern  . Not on file   Social History Narrative  . No narrative on file    Past Surgical History  Procedure Date  . Cholecystectomy   . Shoulder surgery     left  . Colonoscopy 03/10/2011    Procedure: COLONOSCOPY;  Surgeon:  Daneil Dolin, MD;  [facility];  Service: Endoscopy;  Laterality: N/A;  12:45      Physical Exam: General: Well developed, well nourished, in no acute distress Head: Eyes PERRLA, No xanthomas.   Normal cephalic and atramatic  Lungs: Clear bilaterally to auscultation and percussion. Heart: HRRR S1 S2, bradycardic with soft 1/6 systolic murmur.  Pulses are 2+ & equal.            No carotid bruit. No JVD.  No abdominal bruits. No femoral bruits. Abdomen: Bowel sounds are positive, abdomen soft and non-tender without masses or                  Hernia's noted. Msk:  Back normal, normal gait. Normal strength and tone for age. Extremities: No clubbing, cyanosis or edema.  DP +1 Neuro: Alert and oriented X 3. Forgetful and lightheaded. Psych:  Good affect, responds appropriately   Labs:   Lab Results  Component Value Date   WBC 7.7 08/21/2010   HGB 11.7* 08/21/2010   HCT 36.1 08/21/2010   MCV 94.3 08/21/2010   PLT 158 08/21/2010   No results found for this basename: NA,K,CL,CO2,BUN,CREATININE,CALCIUM,LABALBU,PROT,BILITOT,ALKPHOS,ALT,AST,GLUCOSE in the last 168 hours Lab Results  Component Value Date   CKTOTAL 266* 08/10/2007   CKMB 1.4 08/10/2007    Lab Results  Component Value Date   CHOL  Value: 130        ATP III CLASSIFICATION:  <200     mg/dL   Desirable  200-239  mg/dL   Borderline High  >=240    mg/dL   High 08/08/2007   Lab Results  Component Value Date   HDL 64 08/08/2007   Lab Results  Component Value Date   LDLCALC  Value: 57        Total Cholesterol/HDL:CHD Risk Coronary Heart Disease Risk Table                     Men   Women  1/2 Average Risk   3.4   3.3 08/08/2007   Lab Results  Component Value Date   TRIG 45 08/08/2007   TRIG 106 08/07/2007   Lab Results  Component Value Date   CHOLHDL 2.0 08/08/2007   No results found for this basename: LDLDIRECT      Radiology: No results found. [id number] rhythm with PAC, rate of 48 bpm.   ASSESSMENT AND  PLAN:  1. Sympomatic Bradycardia: He has been having progressive dyspnea on exertion lightheadedness and presyncope. She has reported this to her primary care physician Dr. Nevada Crane who has referred her to [place] today. She is hypotensive, orthostatic, but without complaints of chest pain. I have spoken with Dr. Virl Axe M.D. a  phone at the [facility] office who recommends admission to step down. It is noted that the patient has taken her Coumadin, and will need to have this held on admission. I have explained this to the patient who verbalizes understanding and is willing to be admitted. I have called Dr. Aundra Dubin, DOD, to make him aware of the patient's transfer and admission. Dr. Domenic Polite has reviewed the patient's history, examined the patient, and spoken with her as well.  2. Arial fibrillation: Will not restart flecainide as she may be having sinus node dysfunction. We will also hold Coumadin. We will discontinue benazepril. Per Dr. Domenic Polite IV fluids will be started, and [facility] has been notified.  Signed: Jory Sims 02/04/2012, 1:05 PM Co-Sign MD

## 2012-02-05 ENCOUNTER — Encounter (HOSPITAL_COMMUNITY)
Admission: EM | Disposition: A | Discharge status: Home / Self Care | Payer: Self-pay | Source: Other Acute Inpatient Hospital | Attending: Cardiology

## 2012-02-05 DIAGNOSIS — I4891 Unspecified atrial fibrillation: Secondary | ICD-10-CM

## 2012-02-05 DIAGNOSIS — I359 Nonrheumatic aortic valve disorder, unspecified: Secondary | ICD-10-CM

## 2012-02-05 DIAGNOSIS — I498 Other specified cardiac arrhythmias: Secondary | ICD-10-CM

## 2012-02-05 HISTORY — PX: PERMANENT PACEMAKER INSERTION: SHX5480

## 2012-02-05 HISTORY — PX: PACEMAKER INSERTION: SHX728

## 2012-02-05 LAB — LIPID PANEL
HDL: 47 mg/dL (ref >39)
LDL Cholesterol: 41 mg/dL (ref 0–99)
Triglycerides: 147 mg/dL (ref <150)
VLDL: 29 mg/dL (ref 0–40)

## 2012-02-05 LAB — BASIC METABOLIC PANEL
CO2: 23 mEq/L (ref 19–32)
Calcium: 9.1 mg/dL (ref 8.4–10.5)
Creatinine, Ser: 1.22 mg/dL — ABNORMAL HIGH (ref 0.50–1.10)
GFR calc Af Amer: 51 mL/min — ABNORMAL LOW (ref >90)
Sodium: 141 mEq/L (ref 135–145)

## 2012-02-05 LAB — CBC
MCV: 94.6 fL (ref 78.0–100.0)
Platelets: 117 10*3/uL — ABNORMAL LOW (ref 150–400)
RBC: 3.9 MIL/uL (ref 3.87–5.11)
RDW: 13.9 % (ref 11.5–15.5)
WBC: 5.4 10*3/uL (ref 4.0–10.5)

## 2012-02-05 LAB — PROTIME-INR
Prothrombin Time: 15 seconds (ref 11.6–15.2)
Prothrombin Time: 34.1 seconds — ABNORMAL HIGH (ref 11.6–15.2)

## 2012-02-05 LAB — HEMOGLOBIN A1C
Hgb A1c MFr Bld: 6 % — ABNORMAL HIGH (ref <5.7)
Mean Plasma Glucose: 126 mg/dL — ABNORMAL HIGH (ref <117)

## 2012-02-05 LAB — GLUCOSE, CAPILLARY
Glucose-Capillary: 78 mg/dL (ref 70–99)
Glucose-Capillary: 152 mg/dL — ABNORMAL HIGH (ref 70–99)

## 2012-02-05 SURGERY — PERMANENT PACEMAKER INSERTION
Anesthesia: LOCAL

## 2012-02-05 MED ORDER — SODIUM CHLORIDE 0.9 % IJ SOLN
3.0000 mL | Freq: Two times a day (BID) | INTRAMUSCULAR | Status: DC
  Administered 2012-02-06: 3 mL via INTRAVENOUS

## 2012-02-05 MED ORDER — MIDAZOLAM HCL 5 MG/5ML IJ SOLN
INTRAMUSCULAR | Status: AC
  Filled 2012-02-05: qty 5

## 2012-02-05 MED ORDER — FENTANYL CITRATE 0.05 MG/ML IJ SOLN
INTRAMUSCULAR | Status: AC
  Filled 2012-02-05: qty 2

## 2012-02-05 MED ORDER — CHLORHEXIDINE GLUCONATE 4 % EX LIQD
60.0000 mL | Freq: Once | CUTANEOUS | Status: AC
  Administered 2012-02-05: 4 via TOPICAL
  Filled 2012-02-05: qty 60

## 2012-02-05 MED ORDER — DIPHENHYDRAMINE HCL 25 MG PO CAPS
25.0000 mg | ORAL_CAPSULE | Freq: Once | ORAL | Status: AC
  Administered 2012-02-05: 25 mg via ORAL
  Filled 2012-02-05: qty 1

## 2012-02-05 MED ORDER — SODIUM CHLORIDE 0.9 % IR SOLN
80.0000 mg | Status: DC
  Filled 2012-02-05 (×2): qty 2

## 2012-02-05 MED ORDER — VANCOMYCIN HCL IN DEXTROSE 1-5 GM/200ML-% IV SOLN
1000.0000 mg | INTRAVENOUS | Status: DC
  Filled 2012-02-05 (×2): qty 200

## 2012-02-05 MED ORDER — SODIUM CHLORIDE 0.9 % IV SOLN: 250.0000 mL | INTRAVENOUS | Status: DC | PRN

## 2012-02-05 MED ORDER — HEPARIN (PORCINE) IN NACL 2-0.9 UNIT/ML-% IJ SOLN
INTRAMUSCULAR | Status: AC
  Filled 2012-02-05: qty 500

## 2012-02-05 MED ORDER — ACETAMINOPHEN 325 MG PO TABS: 325.0000 mg | ORAL_TABLET | ORAL | Status: DC | PRN

## 2012-02-05 MED ORDER — SODIUM CHLORIDE 0.9 % IV SOLN
INTRAVENOUS | Status: DC
  Administered 2012-02-05: 12:00:00 via INTRAVENOUS

## 2012-02-05 MED ORDER — LIDOCAINE HCL (PF) 1 % IJ SOLN
INTRAMUSCULAR | Status: AC
  Filled 2012-02-05: qty 60

## 2012-02-05 MED ORDER — ONDANSETRON HCL 4 MG/2ML IJ SOLN: 4.0000 mg | Freq: Four times a day (QID) | INTRAMUSCULAR | Status: DC | PRN

## 2012-02-05 MED ORDER — INSULIN ASPART 100 UNIT/ML ~~LOC~~ SOLN: 0.0000 [IU] | Freq: Three times a day (TID) | SUBCUTANEOUS | Status: DC

## 2012-02-05 MED ORDER — HYDROCODONE-ACETAMINOPHEN 5-325 MG PO TABS
1.0000 | ORAL_TABLET | ORAL | Status: DC | PRN
  Administered 2012-02-05 – 2012-02-06 (×2): 1 via ORAL
  Administered 2012-02-06: 2 via ORAL
  Filled 2012-02-05 (×2): qty 2
  Filled 2012-02-05: qty 1

## 2012-02-05 MED ORDER — SODIUM CHLORIDE 0.9 % IJ SOLN: 3.0000 mL | INTRAMUSCULAR | Status: DC | PRN

## 2012-02-05 MED ORDER — VANCOMYCIN HCL IN DEXTROSE 1-5 GM/200ML-% IV SOLN
1000.0000 mg | Freq: Two times a day (BID) | INTRAVENOUS | Status: AC
  Administered 2012-02-06: 1000 mg via INTRAVENOUS
  Filled 2012-02-05: qty 200

## 2012-02-05 NOTE — Op Note (Signed)
SURGEON:  Thompson Grayer, MD     PREPROCEDURE DIAGNOSIS:  Symptomatic Bradycardia    POSTPROCEDURE DIAGNOSIS:  Symptomatic Bradycardia     PROCEDURES:   1. Left upper extremity venography.   2. Pacemaker implantation.     INTRODUCTION: [name] is a 71 y.o. female  with a history of bradycardia who presents today for pacemaker implantation.  The patient reports intermittent episodes of dizziness over the past few months.  No reversible causes have been identified.  The patient therefore presents today for pacemaker implantation.     DESCRIPTION OF PROCEDURE:  Informed written consent was obtained, and the patient was brought to the electrophysiology lab in a fasting state.  The patient received IV Versed and Fentanyl as sedation for the procedure today.  The patients left chest was prepped and draped in the usual sterile fashion by the EP lab staff. The skin overlying the left deltopectoral region was infiltrated with lidocaine for local analgesia.  A 4-cm incision was made over the left deltopectoral region.  A left subcutaneous pacemaker pocket was fashioned using a combination of sharp and blunt dissection. Electrocautery was required to assure hemostasis.    Left Upper Extremity Venography: A venogram of the left upper extremity was performed, which revealed a small left cephalic vein, which emptied into a large left subclavian vein.  The left axillary vein was large in size.    RA/RV Lead Placement: The left axillary vein was cannulated.  Through the left axillary vein, a Comptroller model [id number] (serial number [id number]) right atrial lead and a Lea- 62 (serial number [id number]) right ventricular lead were advanced with fluoroscopic visualization into the right atrial appendage and right ventricular apex positions respectively.  Initial atrial lead P- waves measured 3.2 mV with impedance of 445 ohms and a threshold of 0.7 V at 0.5 msec.  Right ventricular lead  R-waves measured 11 mV with an impedance of 791 ohms and a threshold of 0.5 V at 0.5 msec.  Both leads were secured to the pectoralis fascia using #2-0 silk over the suture sleeves.   Device Placement:  The leads were then connected to a [place] DR RF model [id number] (serial number [id number]) pacemaker.  The pocket was irrigated with copious gentamicin solution.  The pacemaker was then placed into the pocket.  The pocket was then closed in 2 layers with 2.0 Vicryl suture for the subcutaneous and subcuticular layers.  Steri-   Strips and a sterile dressing were then applied.  There were no early apparent complications.     CONCLUSIONS:   1. Successful implantation of a Investment banker, corporate DR RF dual-chamber pacemaker for symptomatic bradycardia  2. No early apparent complications.           Thompson Grayer, MD 02/05/2012 4:58 PM

## 2012-02-05 NOTE — Consult Note (Signed)
ELECTROPHYSIOLOGY CONSULT NOTE  Patient ID: [name] MRN: [id number], DOB/AGE: [birth year]   Admit date: 02/04/2012 Date of Consult: 02/05/2012  Primary Physician: [name], MD Primary Cardiologist: Lattie Haw, MD Reason for Consultation: Bradycardia  History of Present Illness [name] is a pleasant 71 year old woman with paroxysmal AFib, HTN, DM, hypothyroidism and prior CVA who has been admitted with dizziness, fatigue and near syncope, found to have junctional bradycardia with a heart rate in the low 30s. [name] takes flecainide for AFib but is not on any AV nodal blocking medications. She reports exertional fatigue and SOB x 2 months which has gradually worsened. She denies CP. She experienced an episode of near-syncope while driving 2 days ago prompting her to schedule and appointment with her PCP. She was found to be bradycardic with a rate in the 40s and was referred to our office in [place] for further evaluation. She was then admitted to [facility].   Past Medical History Past Medical History  Diagnosis Date  . Pancreatitis     biliary  . CVA (cerebral vascular accident)   . Hypercholesterolemia   . DM (diabetes mellitus)   . HTN (hypertension)   . Hypothyroidism   . GERD (gastroesophageal reflux disease)   . S/P endoscopy March 2009    multiple 3-4 mmsessile gastric polyps, benign, singl antral erosions, negative H.pylori, reactive gastropathy ? NSAIDs  . S/P colonoscopy March 2006    Dr. Olevia Perches: per e-chart report: diverticulosis, path adenomatous polyps  . A-fib   . Colon polyps   . Depression   . Chronic kidney disease     Past Surgical History Past Surgical History  Procedure Date  . Cholecystectomy   . Shoulder surgery     left  . Colonoscopy 03/10/2011    Procedure: COLONOSCOPY;  Surgeon: Daneil Dolin, MD;  Location: [facility];  Service: Endoscopy;  Laterality: N/A;  12:45     Allergies/Intolerances Allergies  Allergen Reactions  . Other  Hives    Florinal for headaches.  . Penicillins Rash  . Tetanus Toxoids Rash    Inpatient Medications . aspirin  324 mg Oral NOW   Or  . aspirin  300 mg Rectal NOW  . influenza  inactive virus vaccine  0.5 mL Intramuscular Tomorrow-1000  . DISCONTD: aspirin EC  81 mg Oral Daily   Family History Family History  Problem Relation Age of Onset  . Colon cancer Neg Hx   . Heart disease    . Arthritis    . Lung disease    . Diabetes    . Kidney disease       Social History Social History  . Marital Status: Legally Separated   Social History Main Topics  . Smoking status: Never Smoker   . Smokeless tobacco: Not on file  . Alcohol Use: No  . Drug Use: No   Review of Systems General: No chills, fever, night sweats or weight changes  Cardiovascular: No chest pain, dyspnea on exertion, edema, orthopnea, palpitations, paroxysmal nocturnal dyspnea Dermatological: No rash, lesions or masses Respiratory: No cough, dyspnea Urologic: No hematuria, dysuria Abdominal: No nausea, vomiting, diarrhea, bright red blood per rectum, melena, or hematemesis Neurologic: No visual changes, weakness, changes in mental status All other systems reviewed and are otherwise negative except as noted above.  Physical Exam Vitals: Blood pressure 138/48, pulse 32, temperature 98.3 F (36.8 C), temperature source Oral, resp. rate 13, SpO2 97.00%.  General: Well developed, well appearing 71 year  old female in no acute distress. HEENT: Normocephalic, atraumatic. EOMs intact. Sclera nonicteric. Oropharynx clear.  Neck: Supple without bruits. No JVD. Lungs: Respirations regular and unlabored, CTA bilaterally. No wheezes, rales or rhonchi. Heart: Bradycardic. S1, S2 present. No murmurs, rub, S3 or S4. Abdomen: Soft, non-tender, non-distended. BS present x 4 quadrants. No hepatosplenomegaly.  Extremities: No clubbing, cyanosis or edema. DP/PT/Radials 2+ and equal bilaterally. Psych: Normal affect. Neuro:  Alert and oriented X 3. Moves all extremities spontaneously. Musculoskeletal: No kyphosis. Skin: Intact. Warm and dry. No rashes or petechiae in exposed areas.   Labs Lab Results  Component Value Date   WBC 5.4 02/05/2012   HGB 12.0 02/05/2012   HCT 36.9 02/05/2012   MCV 94.6 02/05/2012   PLT 117* 02/05/2012    Lab 02/05/12 0545 02/04/12 1724  NA 141 --  K 5.1 --  CL 110 --  CO2 23 --  BUN 38* --  CREATININE 1.22* --  CALCIUM 9.1 --  PROT -- 6.0  BILITOT -- 0.2*  ALKPHOS -- 72  ALT -- 10  AST -- 15  GLUCOSE 120* --   No components found with this basename: MAGNESIUM No components found with this basename: POCBNP:3 No results found for this basename: DDIMER    Basename 02/04/12 1724  TSH 0.418  T4TOTAL --  T3FREE --  THYROIDAB --    Basename 02/05/12 0545  INR 1.16   Of note, an INR is being repeated now to confirm value from this AM  Radiology/Studies No results found. Echocardiogram  pending 12-lead ECG shows junctional bradycardia at 33 bpm; QRS 98 ms Telemetry shows junctional bradycardia  Assessment and Plan 1. Symptomatic bradycardia [name] has documented symptomatic bradycardia with junctional rhythm and exertional fatigue, dizziness and near syncope. We have recommended PPM implantation. Risks, benefits and alternatives to PPM implantation were discussed in detail with her today. These risks include, but are not limited to, bleeding, infection, pneumothorax, perforation, tamponade, vascular damage, renal failure, lead dislodgement, MI, stroke and death. [name] expressed verbal understanding and agrees to proceed with PPM implantation. We will check an echocardiogram prior to the procedure. We will also check her thyroid function.  INR is 3.3 today.  The patient was seen, examined and plan of care formulated with Dr. Thompson Grayer. Signed, Ileene Hutchinson, PA-C 02/05/2012, 8:52 AM  I have seen, examined the patient, and reviewed the above  assessment and plan.  Changes to above are made where necessary. Echo is reviewed.  TSH is normal.  INR today is 3.3. She presents with clear symptomatic bradycardia without reversible cause.  Given her history of afib, she will requier medical therapy for rate and rhythm control long term.  I would therefore recommend pacemaker implantation at this time.  Risks, benefits, alternatives to pacemaker implantation were discussed in detail with the patient today. The patient understands that the risks include but are not limited to bleeding, infection, pneumothorax, perforation, tamponade, vascular damage, renal failure, MI, stroke, death,  and lead dislodgement and wishes to proceed. We will therefore proceed at this time.  Given prior stroke, I will give 2 u FFP to reduce INR but would like to avoid giving vitamin K which would more significantly decrease her INR. Repeat INR tomorrow and restart coumadin at that point to maintain INR >2.  I would not want to bridge with lovenox/heparin due to risks of increased bleeding outweighing the short term stroke risks.   Co Sign: Thompson Grayer, MD 02/05/2012 11:28 AM

## 2012-02-05 NOTE — Care Management Note (Signed)
    Page 1 of 1   02/05/2012     10:21:05 AM   CARE MANAGEMENT NOTE 02/05/2012  Patient:  [name], [name]   Account Number:  [id number]  Date Initiated:  02/05/2012  Documentation initiated by:  Elissa Hefty  Subjective/Objective Assessment:   adm w symptomatic brady     Action/Plan:   lives alone, pcp dr zack hall   Anticipated DC Date:     Anticipated DC Plan:  [place]  CM consult      Choice offered to / List presented to:             Status of service:   Medicare Important Message given?   (If response is "NO", the following Medicare IM given date fields will be blank) Date Medicare IM given:   Date Additional Medicare IM given:    Discharge Disposition:  HOME/SELF CARE  Per UR Regulation:  Reviewed for med. necessity/level of care/duration of stay  If discussed at [place] of Stay Meetings, dates discussed:    Comments:  9/13 10:20a debbie Dezmin Kittelson rn,bsn [id number]

## 2012-02-05 NOTE — Progress Notes (Signed)
  Echocardiogram 2D Echocardiogram has been performed.  Mauricio Po 02/05/2012, 9:56 AM

## 2012-02-06 ENCOUNTER — Encounter (HOSPITAL_COMMUNITY): Payer: Self-pay | Admitting: Cardiology

## 2012-02-06 ENCOUNTER — Inpatient Hospital Stay (HOSPITAL_COMMUNITY): Payer: Medicare Other

## 2012-02-06 LAB — BASIC METABOLIC PANEL
BUN: 25 mg/dL — ABNORMAL HIGH (ref 6–23)
CO2: 24 mEq/L (ref 19–32)
Chloride: 105 mEq/L (ref 96–112)
Creatinine, Ser: 1.03 mg/dL (ref 0.50–1.10)

## 2012-02-06 LAB — PREPARE FRESH FROZEN PLASMA: Unit division: 0

## 2012-02-06 LAB — GLUCOSE, CAPILLARY
Glucose-Capillary: 120 mg/dL — ABNORMAL HIGH (ref 70–99)
Glucose-Capillary: 114 mg/dL — ABNORMAL HIGH (ref 70–99)

## 2012-02-06 LAB — PROTIME-INR: INR: 1.74 — ABNORMAL HIGH (ref 0.00–1.49)

## 2012-02-06 MED ORDER — SILVER SULFADIAZINE 1 % EX CREA
TOPICAL_CREAM | Freq: Two times a day (BID) | CUTANEOUS | Status: DC
  Administered 2012-02-06: 13:00:00 via TOPICAL
  Filled 2012-02-06: qty 85

## 2012-02-06 MED ORDER — WARFARIN SODIUM 5 MG PO TABS
5.0000 mg | ORAL_TABLET | Freq: Once | ORAL | Status: AC
  Administered 2012-02-06: 5 mg via ORAL
  Filled 2012-02-06: qty 1

## 2012-02-06 MED ORDER — WARFARIN - PHYSICIAN DOSING INPATIENT: Freq: Every day | Status: DC

## 2012-02-06 MED ORDER — WARFARIN SODIUM 5 MG PO TABS: 5.0000 mg | ORAL_TABLET | Freq: Every day | ORAL | Status: DC

## 2012-02-06 NOTE — Discharge Summary (Addendum)
Discharge Summary   Patient ID: [name] MRN: [id number], DOB/AGE: [birth year] 52 y.o.  Primary MD: Diane Neighbors, MD Primary Cardiologist: Dr. Leanora Cover. [name] (EP) Admit date: 02/04/2012 D/C date:     02/06/2012      Primary Discharge Diagnoses:  1. Symptomatic Bradycardia  - s/p St. Jude dual chamber PPM 02/05/12  Secondary Discharge Diagnoses:  . Pancreatitis     biliary  . CVA (cerebral vascular accident)   . Hypercholesterolemia   . DM (diabetes mellitus)   . HTN (hypertension)   . Hypothyroidism   . GERD (gastroesophageal reflux disease)   . S/P endoscopy March 2009    multiple 3-4 mmsessile gastric polyps, benign, singl antral erosions, negative H.pylori, reactive gastropathy ? NSAIDs  . S/P colonoscopy March 2006    Dr. Olevia Perches: per e-chart report: diverticulosis, path adenomatous polyps  . A-fib   . Colon polyps   . Depression   . Chronic kidney disease   . 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     Allergies Allergies  Allergen Reactions  . Other Hives    Florinal for headaches.  . Penicillins Rash  . Tetanus Toxoids Rash    Diagnostic Studies/Procedures:   02/05/12 - Pacemaker Implantation - St Jude Medical model [id number] (serial number [id number]) right atrial lead  - [place] (serial number [id number]) right ventricular lead  - [address] Jude Medical Accent DR RF model [id number] (serial number [id number]) pacemaker   02/05/12 - Echo Study Conclusions: - Left ventricle: The cavity size was normal. Wall thickness was normal. Systolic function was normal. The estimated ejection fraction was in the range of 55% to 60%. Wall motion was normal; there were no regional wall motion abnormalities. - Aortic valve: Mild regurgitation. - Mitral valve: Calcified annulus. Mildly thickened leaflets. Mild regurgitation. - Left atrium: The atrium was mildly dilated. - Pulmonary arteries: Systolic pressure was mildly to  moderately increased. Impressions: There appears to be a density in right atrium; ?catheter.  History of Present Illness: 71 y.o. female w/ the above medical problems who was admitted to [facility] on 02/05/12 for placement of permanent pacemaker in the setting of symptomatic bradycardia. She has a history of paroxysmal atrial fibrillation and in an effort to maintain sinus rhythm is on flecainide. She presented to the [place] office on 02/04/12 with complaints of presyncope and was found to be hypotensive and bradycardic and had an EKG showing junctional rhythm. Given her symptomatic bradycardia without reversible causes it was felt she needed admission to [facility] for pacemaker implantation.  Hospital Course: [name] presented to [facility] in stable condition. Her INR was supratherapeutic at 3.3 for which she was given FFP. She underwent successful placement of a St Jude Medical Accent DR RF dual-chamber pacemaker  on 02/05/12. She tolerated the procedure well without complications and was observed overnight. Postop CXR was without pneumothorax or other acute findings. INR on morning of discharge was 1.74 for which she was given 5mg  coumadin, instructed to continue 5mg  daily, and have INR follow up on 9/17. She was seen and evaluated by Dr. Rayann [name] who felt she was stable for discharge home with plans for follow up as scheduled below.   Discharge Vitals: Blood pressure 114/68, pulse 65, temperature 98.5 F (36.9 C), temperature source Oral, resp. rate 18, height 5\' 7"  (1.702 m), weight 214 lb (97.07 kg), SpO2 98.00%.  Well developed and nourished in no acute  distress HENT normal Neck supple with JVP-flat Carotids brisk and full without bruits Device pocket  without hematoma or erythema  Clear Regular rate and rhythm, no murmurs or gallops Abd-soft with active BS without hepatomegaly No Clubbing cyanosis edema Skin-warm and dry; excoriation from tape A &  Oriented  Grossly normal sensory and motor function   Labs: Component Value Date   WBC 5.4 02/05/2012   HGB 12.0 02/05/2012   HCT 36.9 02/05/2012   MCV 94.6 02/05/2012   PLT 117* 02/05/2012     02/06/2012 07:05  Prothrombin Time 20.7 (H)  INR 1.74 (H)   Lab 02/06/12 0705 02/04/12 1724  NA 139 --  K 4.4 --  CL 105 --  CO2 24 --  BUN 25* --  CREATININE 1.03 --  CALCIUM 9.3 --  PROT -- 6.0  BILITOT -- 0.2*  ALKPHOS -- 72  ALT -- 10  AST -- 15  GLUCOSE 124* --   Component Value Date   CHOL 117 02/05/2012   HDL 47 02/05/2012   LDLCALC 41 02/05/2012   TRIG 147 02/05/2012     02/05/2012 09:10  TSH 0.463     02/04/2012 17:24  Hemoglobin A1C 6.0 (H)    Discharge Medications     Medication List     As of 02/06/2012  8:57 AM    TAKE these medications         atorvastatin 40 MG tablet   Commonly known as: LIPITOR   Take 40 mg by mouth daily.      benazepril 20 MG tablet   Commonly known as: LOTENSIN   Take 30 mg by mouth daily.      Calcium 600-200 MG-UNIT per tablet   Take 1 tablet by mouth 2 (two) times daily.      calcium carbonate 1250 MG capsule   Take 1,250 mg by mouth 2 (two) times daily with a meal.      citalopram 20 MG tablet   Commonly known as: CELEXA   Take 20 mg by mouth daily.      DHA-EPA-Flaxseed Oil-Vitamin E Caps   Take 1 capsule by mouth 2 (two) times daily.      diclofenac sodium 1 % Gel   Commonly known as: VOLTAREN   Apply 1 application topically 4 (four) times daily.      ferrous sulfate 325 (65 FE) MG tablet   Take 325 mg by mouth daily.      flecainide 100 MG tablet   Commonly known as: TAMBOCOR   Take 100 mg by mouth 2 (two) times daily.      fluticasone 50 MCG/ACT nasal spray   Commonly known as: FLONASE   Place 2 sprays into the nose daily as needed. For congestion      furosemide 40 MG tablet   Commonly known as: LASIX   Take 40 mg by mouth daily.      gabapentin 100 MG capsule   Commonly known as: NEURONTIN   Take 100  mg by mouth 3 (three) times daily.      glipiZIDE 10 MG 24 hr tablet   Commonly known as: GLUCOTROL XL   Take 10 mg by mouth daily.      HYDROcodone-acetaminophen 5-500 MG per tablet   Commonly known as: VICODIN   Take 1 tablet by mouth every 6 (six) hours as needed. For pain      levothyroxine 75 MCG tablet   Commonly known as: SYNTHROID, LEVOTHROID   Take 75 mcg  by mouth daily.      omeprazole 40 MG capsule   Commonly known as: PRILOSEC   Take 40 mg by mouth daily.      warfarin 5 MG tablet   Commonly known as: COUMADIN   Take 1 tablet (5 mg total) by mouth daily. Please take 5mg  daily starting Sunday 9/15 and have your INR checked on 9/17 for further instructions on dosing.          Disposition   Discharge Orders    Future Orders Please Complete By Expires   Diet - low sodium heart healthy      Increase activity slowly      Discharge instructions      Comments:   * You received 5mg  of coumadin today before going home. Please do not take another dose tonight. Take 5mg  daily starting tomorrow (Sunday 9/15) and have your INR checked on Tuesday 9/17 for further instructions on dosing.  *PLEASE REMEMBER TO BRING ALL OF YOUR MEDICATIONS TO EACH OF YOUR FOLLOW-UP OFFICE VISITS.     Follow-up Information    Follow up with [facility]. On 02/09/2012. (Our office will call you with your appointment time)    Contact information:   [facility] [address] [place] [address] [phone]      Follow up with [facility] ([place] office will call you with your appointment date/time for your device check in 7-10 days)    Contact information:   [facility] [id number] [place] [address] [phone]      Follow up with Thompson Grayer, MD. In 3 months. (Our office will call you with your appointment date/time)    Contact information:    [facility] [id number] [place]  [place] [address] [phone]          Outstanding Labs/Studies:  1. INR  Duration of Discharge Encounter: Greater than 30 minutes including physician and PA time.  Signed, HOPE, JESSICA PA-C 02/06/2012, 8:57 AM

## 2012-02-06 NOTE — Progress Notes (Signed)
Orthopedic Tech Progress Note Patient Details:  [name] 1940-08-22 [id number]  Patient ID: [name], female   DOB: 28-Oct-1940, 71 y.o.   MRN: [id number] Confirmed wit pt RN that pt has arm sling.   Aizlynn Digilio T 02/06/2012, 11:30 AM

## 2012-02-09 ENCOUNTER — Encounter: Payer: Self-pay | Admitting: *Deleted

## 2012-02-09 DIAGNOSIS — Z950 Presence of cardiac pacemaker: Secondary | ICD-10-CM | POA: Insufficient documentation

## 2012-02-16 ENCOUNTER — Encounter: Payer: Self-pay | Admitting: Internal Medicine

## 2012-02-16 ENCOUNTER — Encounter (INDEPENDENT_AMBULATORY_CARE_PROVIDER_SITE_OTHER): Payer: Medicare Other

## 2012-02-16 ENCOUNTER — Ambulatory Visit (INDEPENDENT_AMBULATORY_CARE_PROVIDER_SITE_OTHER): Payer: Medicare Other | Admitting: *Deleted

## 2012-02-16 DIAGNOSIS — M7989 Other specified soft tissue disorders: Secondary | ICD-10-CM

## 2012-02-16 DIAGNOSIS — R001 Bradycardia, unspecified: Secondary | ICD-10-CM

## 2012-02-16 DIAGNOSIS — I4891 Unspecified atrial fibrillation: Secondary | ICD-10-CM

## 2012-02-16 DIAGNOSIS — M79609 Pain in unspecified limb: Secondary | ICD-10-CM

## 2012-02-16 DIAGNOSIS — Z950 Presence of cardiac pacemaker: Secondary | ICD-10-CM

## 2012-02-16 DIAGNOSIS — I498 Other specified cardiac arrhythmias: Secondary | ICD-10-CM

## 2012-02-16 LAB — PACEMAKER DEVICE OBSERVATION
AL IMPEDENCE PM: 587.5 Ohm
ATRIAL PACING PM: 98
BATTERY VOLTAGE: 2.9779 V

## 2012-02-16 NOTE — Addendum Note (Signed)
Addended by: Andrez Grime on: 02/16/2012 11:11 AM   Modules accepted: Orders

## 2012-02-16 NOTE — Progress Notes (Signed)
Wound check pacer check in clinic

## 2012-03-02 NOTE — H&P (Signed)
Patient ID:  [name]  MRN: [id number]  DOB/AGE: 70-Feb-1942 70 y.o.  Admit date: (Not on file)  [place], [place]  Primary Cardiologist: Jacqulyn Ducking  Active Problems:  * No active hospital problems. *   HPI: [name] is a 71 year old patient of Dr. Lattie Haw, who has been lost to followup since 2009 who has been lost to followup and is normally seen by Dr. Nevada Crane her primary care physician. He follows her for hypothyroidism with medication adjustments. She states that she has had her labs completed within the last couple of weeks and were all found to be normal per patient.  She has a long-standing history of atrial fibrillation on Coumadin along with hypothyroidism, GERD, diabetes, is only me, and a history of a CVA. She is currently not on any rate reducing medications. She is all on flecainide 100 mg twice a day. She states that she has had a cardiac catheterization in 1995, but no further ischemic testing since that time. Review of prior records does show an echocardiogram completed in 2009 revealing normal LV function with an EF of 60-65% there were no wall motion abnormalities at that time.  She has had progressive dizziness with position change, and lengthy sitting. She has been having some mild chest discomfort, and dyspnea on exertion. She is also complaining of leg pain more on the right calf than on the left with varicosities becoming more prominent.  The patient was driving yesterday and had a near syncopal episode where "everything went white" and she was unable to read road signs. She presented to her primary care physician Dr. Nevada Crane and was found to be hypotensive, and bradycardic. Dr. Nevada Crane spoke with Dr. Lattie Haw on the phone, and an appointment was scheduled for our [facility] office today.  In the office today she is feeling very lightheaded and dizzy, orthostatics were completed with a lying blood pressure 82/50, sitting 80/48, standing 76/48 with a heart  rate between 48 and 53 beats per minute. EKG reveals junctional rhythm with PACs rate of 48 beats per minute. EKG was over read by Dr. Domenic Polite who is on site in the regional office today.  I have spoken with Dr. Virl Axe via phone at the [facility] office, given in him the patient's history and he recommends admission to [facility] hospital for further evaluation and treatment. The patient has taken all of her daily medications today. She states in Dr. Juel Burrow office her INR was 3.3.  Review of systems complete and found to be negative unless listed above  Past Medical History   Diagnosis  Date   .  Pancreatitis      biliary   .  CVA (cerebral vascular accident)    .  Hypercholesterolemia    .  DM (diabetes mellitus)    .  HTN (hypertension)    .  Hypothyroidism    .  GERD (gastroesophageal reflux disease)    .  S/P endoscopy  March 2009     multiple 3-4 mmsessile gastric polyps, benign, singl antral erosions, negative H.pylori, reactive gastropathy ? NSAIDs   .  S/P colonoscopy  March 2006     Dr. Olevia Perches: per e-chart report: diverticulosis, path adenomatous polyps   .  A-fib    .  Colon polyps    .  Depression     Family History   Problem  Relation  Age of Onset   .  Colon cancer  Neg Hx    .  Heart  disease     .  Arthritis     .  Lung disease     .  Diabetes     .  Kidney disease      History    Social History   .  Marital Status:  Legally Separated     Spouse Name:  N/A     Number of Children:  N/A   .  Years of Education:  N/A    Occupational History   .  Not on file.    Social History Main Topics   .  Smoking status:  Never Smoker   .  Smokeless tobacco:  Not on file   .  Alcohol Use:  No   .  Drug Use:  No   .  Sexually Active:  Not on file    Other Topics  Concern   .  Not on file    Social History Narrative   .  No narrative on file    Past Surgical History   Procedure  Date   .  Cholecystectomy    .  Shoulder surgery      left   .  Colonoscopy   03/10/2011     Procedure: COLONOSCOPY; Surgeon: Daneil Dolin, MD; Location: [facility]; Service: Endoscopy; Laterality: N/A; 12:45    Physical Exam:  General: Well developed, well nourished, in no acute distress  Head: Eyes PERRLA, No xanthomas. Normal cephalic and atramatic  Lungs: Clear bilaterally to auscultation and percussion.  Heart: HRRR S1 S2, bradycardic with soft 1/6 systolic murmur. Pulses are 2+ & equal.  No carotid bruit. No JVD. No abdominal bruits. No femoral bruits.  Abdomen: Bowel sounds are positive, abdomen soft and non-tender without masses or  Hernia's noted.  Msk: Back normal, normal gait. Normal strength and tone for age.  Extremities: No clubbing, cyanosis or edema. DP +1  Neuro: Alert and oriented X 3. Forgetful and lightheaded.  Psych: Good affect, responds appropriately  Labs:  Lab Results   Component  Value  Date    WBC  7.7  08/21/2010    HGB  11.7*  08/21/2010    HCT  36.1  08/21/2010    MCV  94.3  08/21/2010    PLT  158  08/21/2010   No results found for this basename: NA,K,CL,CO2,BUN,CREATININE,CALCIUM,LABALBU,PROT,BILITOT,ALKPHOS,ALT,AST,GLUCOSE in the last 168 hours  Lab Results   Component  Value  Date    CKTOTAL  266*  08/10/2007    CKMB  1.4  08/10/2007    Lab Results   Component  Value  Date    CHOL  Value: 130 ATP III CLASSIFICATION: <200 mg/dL Desirable 200-239 mg/dL Borderline High >=240 mg/dL High  08/08/2007    Lab Results   Component  Value  Date    HDL  64  08/08/2007    Lab Results   Component  Value  Date    LDLCALC  Value: 57 Total Cholesterol/HDL:CHD Risk Coronary Heart Disease Risk Table Men Women 1/2 Average Risk 3.4 3.3  08/08/2007    Lab Results   Component  Value  Date    TRIG  45  08/08/2007    TRIG  106  08/07/2007    Lab Results   Component  Value  Date    CHOLHDL  2.0  08/08/2007    No results found for this basename: LDLDIRECT    Radiology:  No results found.  [id number] rhythm with PAC, rate of 48 bpm.  ASSESSMENT AND PLAN:  1. Sympomatic Bradycardia: He has been having progressive dyspnea on exertion lightheadedness and presyncope. She has reported this to her primary care physician Dr. Nevada Crane who has referred her to [place] today. She is hypotensive, orthostatic, but without complaints of chest pain. I have spoken with Dr. Virl Axe M.D. a phone at the [facility] office who recommends admission to step down. It is noted that the patient has taken her Coumadin, and will need to have this held on admission. I have explained this to the patient who verbalizes understanding and is willing to be admitted. I have called Dr. Aundra Dubin, DOD, to make him aware of the patient's transfer and admission. Dr. Domenic Polite has reviewed the patient's history, examined the patient, and spoken with her as well.  2. Arial fibrillation: Will not restart flecainide as she may be having sinus node dysfunction. We will also hold Coumadin. We will discontinue benazepril. Per Dr. Domenic Polite IV fluids will be started, and [facility] has been notified.  Signed:  Jory Sims  02/04/2012, 1:05 PM  Co-Sign MD   Jory Sims, NP 02/04/2012 1:30 PM Addendum  H& P

## 2012-03-02 NOTE — H&P (Signed)
This note is signed retroactively, just arriving in my in-basket today 03/02/12. I provided original documentation on the patient on the day of the encounter in September.  Satira Sark, M.D., F.A.C.C.

## 2012-05-09 ENCOUNTER — Encounter: Payer: Medicare Other | Admitting: Internal Medicine

## 2012-06-23 ENCOUNTER — Encounter: Payer: Medicare Other | Admitting: Internal Medicine

## 2012-07-15 ENCOUNTER — Ambulatory Visit (INDEPENDENT_AMBULATORY_CARE_PROVIDER_SITE_OTHER): Payer: Medicare Other | Admitting: Internal Medicine

## 2012-07-15 ENCOUNTER — Encounter: Payer: Self-pay | Admitting: Internal Medicine

## 2012-07-15 VITALS — BP 144/68 | HR 64 | Ht 67.0 in | Wt 224.4 lb

## 2012-07-15 DIAGNOSIS — Z950 Presence of cardiac pacemaker: Secondary | ICD-10-CM

## 2012-07-15 DIAGNOSIS — I4891 Unspecified atrial fibrillation: Secondary | ICD-10-CM

## 2012-07-15 DIAGNOSIS — I498 Other specified cardiac arrhythmias: Secondary | ICD-10-CM

## 2012-07-15 DIAGNOSIS — R001 Bradycardia, unspecified: Secondary | ICD-10-CM

## 2012-07-15 LAB — PACEMAKER DEVICE OBSERVATION
AL THRESHOLD: 0.75 V
ATRIAL PACING PM: 73
BAMS-0003: 70 {beats}/min
DEVICE MODEL PM: 7382865
RV LEAD AMPLITUDE: 12 mv
RV LEAD IMPEDENCE PM: 610 Ohm
RV LEAD THRESHOLD: 0.75 V

## 2012-07-15 NOTE — Progress Notes (Signed)
PCP: [name], MD Primary Cardiologist:  Dr Georgina Quint is a 72 y.o. female who presents today for routine electrophysiology followup.  Since her recent pacemaker implantation, the patient reports doing very well.  Her dizziness has resolved and energy has improved.  She has occasional SOB with moderate exertion which is stable. Today, she denies symptoms of palpitations, chest pain,  lower extremity edema,  presyncope, or syncope.  The patient is otherwise without complaint today.   Past Medical History  Diagnosis Date  . Pancreatitis     biliary  . CVA (cerebral vascular accident)   . Hypercholesterolemia   . DM (diabetes mellitus)   . HTN (hypertension)   . Hypothyroidism   . GERD (gastroesophageal reflux disease)   . S/P endoscopy March 2009    multiple 3-4 mmsessile gastric polyps, benign, singl antral erosions, negative H.pylori, reactive gastropathy ? NSAIDs  . S/P colonoscopy March 2006    Dr. Olevia Perches: per e-chart report: diverticulosis, path adenomatous polyps  . A-fib   . Colon polyps   . Depression   . Chronic kidney disease   . 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 Cholecystectomy    . Shoulder surgery      left  . Colonoscopy  03/10/2011    Procedure: COLONOSCOPY;  Surgeon: Daneil Dolin, MD;  Location: [facility];  Service: Endoscopy;  Laterality: N/A;  12:45  . Pacemaker insertion  02/05/12    SJM Accent DR RF implanted by Dr [name] for symptomatic bradycardia    Current Outpatient Prescriptions  Medication Sig Dispense Refill  . atorvastatin (LIPITOR) 40 MG tablet Take 40 mg by mouth daily.        . benazepril (LOTENSIN) 20 MG tablet Take 30 mg by mouth daily.       . calcium carbonate 1250 MG capsule Take 1,250 mg by mouth 2 (two) times daily with a meal.       . citalopram (CELEXA) 20 MG tablet Take 20 mg by mouth daily.        . DHA-EPA-Flaxseed Oil-Vitamin  E CAPS Take 1 capsule by mouth 2 (two) times daily.      . diclofenac sodium (VOLTAREN) 1 % GEL Apply 1 application topically 4 (four) times daily.      . ferrous sulfate 325 (65 FE) MG tablet Take 325 mg by mouth daily.      . flecainide (TAMBOCOR) 100 MG tablet Take 100 mg by mouth 2 (two) times daily.        . fluticasone (FLONASE) 50 MCG/ACT nasal spray Place 2 sprays into the nose daily as needed. For congestion      . furosemide (LASIX) 40 MG tablet Take 40 mg by mouth daily.        Marland Kitchen gabapentin (NEURONTIN) 100 MG capsule Take 100 mg by mouth 3 (three) times daily. On hold      . glipiZIDE (GLUCOTROL XL) 10 MG 24 hr tablet Take 10 mg by mouth daily.        Marland Kitchen HYDROcodone-acetaminophen (VICODIN) 5-500 MG per tablet Take 1 tablet by mouth every 6 (six) hours as needed. For pain      . levothyroxine (SYNTHROID, LEVOTHROID) 75 MCG tablet Take 75 mcg by mouth daily.        Marland Kitchen omeprazole (PRILOSEC) 40 MG capsule Take 40 mg by mouth daily.        Marland Kitchen  warfarin (COUMADIN) 5 MG tablet Take 1 tablet (5 mg total) by mouth daily. Please take 5mg  daily starting Sunday 9/15 and have your INR checked on 9/17 for further instructions on dosing.       No current facility-administered medications for this visit.    Physical Exam: Filed Vitals:   07/15/12 1206  BP: 144/68  Pulse: 64  Height: 5\' 7"  (1.702 m)  Weight: 224 lb 6.4 oz (101.787 kg)    GEN- The patient is well appearing, alert and oriented x 3 today.   Head- normocephalic, atraumatic Eyes-  Sclera clear, conjunctiva pink Ears- hearing intact Oropharynx- clear Lungs- Clear to ausculation bilaterally, normal work of breathing Chest- pacemaker pocket is well healed Heart- Regular rate and rhythm, no murmurs, rubs or gallops, PMI not laterally displaced GI- soft, NT, ND, + BS Extremities- no clubbing, cyanosis, or edema  Pacemaker interrogation- reviewed in detail today,  See PACEART report  Assessment and Plan:  1. Bradycardia Normal  pacemaker function See Claudia Desanctis Art report No changes today  2. afib Continue coumadin Maintaining sinus rhythm with flecainide  Return in september

## 2012-07-15 NOTE — Patient Instructions (Addendum)
Your physician wants you to follow-up in: September with Dr [name].  You will receive a reminder letter in the mail two months in advance. If you don't receive a letter, please call our office to schedule the follow-up appointment.

## 2013-02-13 ENCOUNTER — Ambulatory Visit: Payer: Medicare Other | Admitting: Internal Medicine

## 2013-02-20 ENCOUNTER — Ambulatory Visit (INDEPENDENT_AMBULATORY_CARE_PROVIDER_SITE_OTHER): Payer: Medicare Other | Admitting: Internal Medicine

## 2013-02-20 ENCOUNTER — Encounter: Payer: Self-pay | Admitting: Internal Medicine

## 2013-02-20 VITALS — BP 99/61 | HR 63 | Ht 67.0 in | Wt 209.0 lb

## 2013-02-20 DIAGNOSIS — R001 Bradycardia, unspecified: Secondary | ICD-10-CM

## 2013-02-20 DIAGNOSIS — I4891 Unspecified atrial fibrillation: Secondary | ICD-10-CM

## 2013-02-20 DIAGNOSIS — Z950 Presence of cardiac pacemaker: Secondary | ICD-10-CM

## 2013-02-20 DIAGNOSIS — I498 Other specified cardiac arrhythmias: Secondary | ICD-10-CM

## 2013-02-20 LAB — PACEMAKER DEVICE OBSERVATION
AL IMPEDENCE PM: 612.5 Ohm
AL THRESHOLD: 0.75 V
ATRIAL PACING PM: 81
BATTERY VOLTAGE: 2.9629 V
RV LEAD IMPEDENCE PM: 600 Ohm

## 2013-02-20 NOTE — Patient Instructions (Signed)
Your physician wants you to follow-up in: 12 months with Dr Vallery Ridge will receive a reminder letter in the mail two months in advance. If you don't receive a letter, please call our office to schedule the follow-up appointment.  Remote monitoring is used to monitor your Pacemaker or ICD from home. This monitoring reduces the number of office visits required to check your device to one time per year. It allows [place] to keep an eye on the functioning of your device to ensure it is working properly. You are scheduled for a device check from home on 05/22/13. You may send your transmission at any time that day. If you have a wireless device, the transmission will be sent automatically. After your physician reviews your transmission, you will receive a postcard with your next transmission date.

## 2013-02-20 NOTE — Progress Notes (Signed)
PCP: Delphina Cahill, MD Primary Cardiologist:  Dr Lattie Haw  (lives in [place])  [name] is a 72 y.o. female who presents today for routine electrophysiology followup.  Since her last office visit, the patient reports doing very well.   She has CRI and is followed by Dr Deterding. She reports that she has been told that her renal function is "stable".  She recently had her lasix increased for orthopnea and has done better since. Today, she denies symptoms of palpitations, chest pain,  lower extremity edema,  presyncope, or syncope.  The patient is otherwise without complaint today.   Past Medical History  Diagnosis Date  . Pancreatitis     biliary  . CVA (cerebral vascular accident)   . Hypercholesterolemia   . DM (diabetes mellitus)   . HTN (hypertension)   . Hypothyroidism   . GERD (gastroesophageal reflux disease)   . S/P endoscopy March 2009    multiple 3-4 mmsessile gastric polyps, benign, singl antral erosions, negative H.pylori, reactive gastropathy ? NSAIDs  . S/P colonoscopy March 2006    Dr. Olevia Perches: per e-chart report: diverticulosis, path adenomatous polyps  . A-fib   . Colon polyps   . Depression   . Chronic kidney disease   . 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 Cholecystectomy    . Shoulder surgery      left  . Colonoscopy  03/10/2011    Procedure: COLONOSCOPY;  Surgeon: Daneil Dolin, MD;  Location: [facility];  Service: Endoscopy;  Laterality: N/A;  12:45  . Pacemaker insertion  02/05/12    SJM Accent DR RF implanted by Dr [name] for symptomatic bradycardia    Current Outpatient Prescriptions  Medication Sig Dispense Refill  . atorvastatin (LIPITOR) 40 MG tablet Take 40 mg by mouth daily.        . benazepril (LOTENSIN) 20 MG tablet Take 20 mg by mouth daily. Take 1 & 1/2 tab daily for total of 30 mg      . calcitRIOL (ROCALTROL) 0.25 MCG capsule Take 0.25 mcg by mouth  daily. Take 2 tabs every other day.      . citalopram (CELEXA) 20 MG tablet Take 20 mg by mouth daily.        . DHA-EPA-Flaxseed Oil-Vitamin E CAPS Take 1 capsule by mouth 2 (two) times daily.      . diclofenac sodium (VOLTAREN) 1 % GEL Apply 1 application topically 4 (four) times daily.      . ferrous sulfate 325 (65 FE) MG tablet Take 325 mg by mouth daily.      . flecainide (TAMBOCOR) 100 MG tablet Take 100 mg by mouth 2 (two) times daily.        . fluticasone (FLONASE) 50 MCG/ACT nasal spray Place 2 sprays into the nose daily as needed. For congestion      . furosemide (LASIX) 40 MG tablet Take 40 mg by mouth daily.        Marland Kitchen glipiZIDE (GLUCOTROL XL) 10 MG 24 hr tablet Take 10 mg by mouth daily.        Marland Kitchen HYDROcodone-acetaminophen (VICODIN) 5-500 MG per tablet Take 1 tablet by mouth every 6 (six) hours as needed. For pain      . levothyroxine (SYNTHROID, LEVOTHROID) 75 MCG tablet Take 75 mcg by mouth daily.        Marland Kitchen omeprazole (PRILOSEC) 40 MG capsule Take 40  mg by mouth daily.        Marland Kitchen warfarin (COUMADIN) 5 MG tablet Take 1 tablet (5 mg total) by mouth daily. Please take 5mg  daily starting Sunday 9/15 and have your INR checked on 9/17 for further instructions on dosing.       No current facility-administered medications for this visit.    Physical Exam: Filed Vitals:   02/20/13 0929  BP: 99/61  Pulse: 63  Height: 5\' 7"  (1.702 m)  Weight: 209 lb (94.802 kg)    GEN- The patient is well appearing, alert and oriented x 3 today.   Head- normocephalic, atraumatic Eyes-  Sclera clear, conjunctiva pink Ears- hearing intact Oropharynx- clear Lungs- Clear to ausculation bilaterally, normal work of breathing Chest- pacemaker pocket is well healed Heart- Regular rate and rhythm, no murmurs, rubs or gallops, PMI not laterally displaced GI- soft, NT, ND, + BS Extremities- no clubbing, cyanosis, or edema  Pacemaker interrogation- reviewed in detail today,  See PACEART report ekg today  reveals sinus rhythm 63 bpm, PR 236, Qtc 476, otherwise normal ekg  Assessment and Plan:  1. Symptomatic sinus node dysfunction Normal pacemaker function See Pace Art report No changes today  2. afib Continue coumadin Maintaining sinus rhythm with flecainide  3. CRI Stable, followed by nephrology Return in September  [place] Return in 1 year

## 2013-02-24 ENCOUNTER — Encounter: Payer: Self-pay | Admitting: Internal Medicine

## 2013-03-30 ENCOUNTER — Other Ambulatory Visit: Payer: Self-pay

## 2013-05-02 ENCOUNTER — Ambulatory Visit (INDEPENDENT_AMBULATORY_CARE_PROVIDER_SITE_OTHER): Payer: Medicare Other

## 2013-05-02 VITALS — BP 113/64 | HR 64 | Resp 12 | Ht 67.0 in | Wt 207.0 lb

## 2013-05-02 DIAGNOSIS — L03039 Cellulitis of unspecified toe: Secondary | ICD-10-CM

## 2013-05-02 DIAGNOSIS — S90129A Contusion of unspecified lesser toe(s) without damage to nail, initial encounter: Secondary | ICD-10-CM

## 2013-05-02 DIAGNOSIS — S90122A Contusion of left lesser toe(s) without damage to nail, initial encounter: Secondary | ICD-10-CM

## 2013-05-02 DIAGNOSIS — B351 Tinea unguium: Secondary | ICD-10-CM

## 2013-05-02 MED ORDER — CLINDAMYCIN HCL 150 MG PO CAPS: 150.0000 mg | ORAL_CAPSULE | Freq: Three times a day (TID) | ORAL | Status: DC

## 2013-05-02 NOTE — Patient Instructions (Signed)

## 2013-05-02 NOTE — Progress Notes (Signed)
   Subjective:    Patient ID: [name], female    DOB: 06-19-1940, 72 y.o.   MRN: [id number]  HPI Comments: '' LOOSE NAIL ON THE LT FOOT BIG TOE IS SORE AND BRUISE. INJURED THE TOENAIL 1 WEEK AGO AND TREATED WITH NEOSPORIN AND WRAP WITH THE BANDAGE.     Review of Systems  Constitutional: Negative.   Respiratory: Negative.   Cardiovascular: Negative.   Gastrointestinal: Negative.   Endocrine: Negative.   Genitourinary: Negative.   Musculoskeletal: Negative.   Skin: Negative.   Allergic/Immunologic: Negative.   Neurological: Negative.   Hematological: Negative.   Psychiatric/Behavioral: Negative.   All other systems reviewed and are negative.       Objective:   Physical Exam Neurovascular status is intact with pedal pulses palpable DP +2/4 bilateral PT plus one over 4 bilateral Refill timed 3-4 seconds skin temperature warm turgor normal there is moderate varicosities noted bilateral. Neurologically epicritic and proprioceptive sensations intact and symmetric bilateral normal plantar response and DTRs are noted. Dermatologically nails thick brittle criptotic and friable 1 through 5 bilateral. Consistent with onychomycosis patient is pedis he treated unsuccessfully in the past. Patient also has separation or loosening of the left hallux nail plate from the nailbed with proximal hematoma of the nail fold consistent with contusion of toe. The nail is loose in showing subungual bleeding and hemorrhage. Patient is on Coumadin therapy for history of stroke as well as atrial fib and pacemaker placement. There were no complications and recent past. No excessive bleeding noted of the toe at this time. Orthopedic biomechanical exam unremarkable noncontributory      Assessment & Plan:  Assessment this time onychomycosis with thick and friability of all the nails. There is contusion and paronychia with lysis of the left hallux nail plate from the nailbed. Plan at this time is for nail  avulsion/excision exam the nailbed local anesthetic administered total of 3 cc 50-50 mixture of 2% Xylocaine plain and 0.5 Marcaine plain to the left great toe. The nail plate is avulsed followed by cleansing with all cleansed application of Betadine ointment gauze dressing. Patient will initiate daily cleansing with antibacterial soap or Betadine soaks daily and application of Neosporin and Band-Aid dressing. Also prescription for clindamycin 150 mg 3 times a day x10 days is dispensed. Recheck in 2-3 weeks for followup for nail check. Contact us in changes any fever chills were developed. There is no current ascending cellulitis.  Harriet Masson DPM

## 2013-05-22 ENCOUNTER — Ambulatory Visit (INDEPENDENT_AMBULATORY_CARE_PROVIDER_SITE_OTHER): Payer: Medicare Other | Admitting: *Deleted

## 2013-05-22 DIAGNOSIS — I4891 Unspecified atrial fibrillation: Secondary | ICD-10-CM

## 2013-05-28 LAB — MDC_IDC_ENUM_SESS_TYPE_REMOTE
Battery Remaining Longevity: 94 mo
Battery Voltage: 2.96 V
Brady Statistic AP VP Percent: 3.4 %
Brady Statistic AP VS Percent: 74 %
Brady Statistic AS VP Percent: 1 %
Brady Statistic AS VS Percent: 22 %
Brady Statistic RA Percent Paced: 78 %
Brady Statistic RV Percent Paced: 3.4 %
Date Time Interrogation Session: 20141229072943
Implantable Pulse Generator Model: 2210
Implantable Pulse Generator Serial Number: 7382865
Lead Channel Impedance Value: 590 Ohm
Lead Channel Impedance Value: 610 Ohm
Lead Channel Pacing Threshold Amplitude: 0.75 V
Lead Channel Pacing Threshold Amplitude: 1 V
Lead Channel Pacing Threshold Pulse Width: 0.5 ms
Lead Channel Pacing Threshold Pulse Width: 0.5 ms
Lead Channel Sensing Intrinsic Amplitude: 2.7 mV
Lead Channel Sensing Intrinsic Amplitude: 9.1 mV
Lead Channel Setting Pacing Amplitude: 2 V
Lead Channel Setting Pacing Amplitude: 2.5 V
Lead Channel Setting Pacing Pulse Width: 0.5 ms
Lead Channel Setting Sensing Sensitivity: 2 mV

## 2013-05-30 ENCOUNTER — Ambulatory Visit: Payer: Medicare Other

## 2013-06-06 ENCOUNTER — Encounter: Payer: Self-pay | Admitting: *Deleted

## 2013-06-08 ENCOUNTER — Encounter: Payer: Self-pay | Admitting: Internal Medicine

## 2013-06-28 ENCOUNTER — Ambulatory Visit (HOSPITAL_COMMUNITY): Payer: Medicare Other

## 2013-07-21 ENCOUNTER — Other Ambulatory Visit (HOSPITAL_COMMUNITY): Payer: Self-pay | Admitting: Internal Medicine

## 2013-07-21 DIAGNOSIS — Z139 Encounter for screening, unspecified: Secondary | ICD-10-CM

## 2013-08-01 ENCOUNTER — Ambulatory Visit (HOSPITAL_COMMUNITY)
Admission: RE | Admit: 2013-08-01 | Discharge: 2013-08-01 | Disposition: A | Discharge status: Home / Self Care | Payer: Medicare HMO | Source: Ambulatory Visit | Attending: Internal Medicine | Admitting: Internal Medicine

## 2013-08-01 DIAGNOSIS — Z1231 Encounter for screening mammogram for malignant neoplasm of breast: Secondary | ICD-10-CM | POA: Insufficient documentation

## 2013-08-01 DIAGNOSIS — Z139 Encounter for screening, unspecified: Secondary | ICD-10-CM

## 2013-08-17 ENCOUNTER — Encounter: Payer: Self-pay | Admitting: Internal Medicine

## 2013-08-23 ENCOUNTER — Encounter: Payer: Self-pay | Admitting: Internal Medicine

## 2013-08-23 ENCOUNTER — Ambulatory Visit (INDEPENDENT_AMBULATORY_CARE_PROVIDER_SITE_OTHER): Payer: Medicare HMO | Admitting: *Deleted

## 2013-08-23 DIAGNOSIS — Z950 Presence of cardiac pacemaker: Secondary | ICD-10-CM

## 2013-08-23 DIAGNOSIS — I498 Other specified cardiac arrhythmias: Secondary | ICD-10-CM

## 2013-08-23 DIAGNOSIS — R001 Bradycardia, unspecified: Secondary | ICD-10-CM

## 2013-08-28 ENCOUNTER — Encounter: Payer: Self-pay | Admitting: Internal Medicine

## 2013-08-28 ENCOUNTER — Telehealth: Payer: Self-pay | Admitting: Internal Medicine

## 2013-08-28 LAB — MDC_IDC_ENUM_SESS_TYPE_REMOTE
Battery Remaining Longevity: 86 mo
Battery Voltage: 2.95 V
Brady Statistic AS VS Percent: 19 %
Brady Statistic RA Percent Paced: 80 %
Brady Statistic RV Percent Paced: 3.6 %
Date Time Interrogation Session: 20150401060259
Implantable Pulse Generator Model: 2210
Implantable Pulse Generator Serial Number: 7382865
Lead Channel Impedance Value: 600 Ohm
Lead Channel Impedance Value: 600 Ohm
Lead Channel Sensing Intrinsic Amplitude: 4.2 mV
Lead Channel Sensing Intrinsic Amplitude: 9.1 mV
Lead Channel Setting Pacing Amplitude: 2 V
Lead Channel Setting Pacing Amplitude: 2.5 V
Lead Channel Setting Pacing Pulse Width: 0.5 ms
Lead Channel Setting Sensing Sensitivity: 2 mV
MDC IDC STAT BRADY AP VP PERCENT: 3.5 %
MDC IDC STAT BRADY AP VS PERCENT: 77 %
MDC IDC STAT BRADY AS VP PERCENT: 1 %

## 2013-08-28 NOTE — Telephone Encounter (Signed)
Pt received MyChart notice. I clarified she can ignore MyChart notifications for missed home remote monitoring appointments but not other notifications. Her device will transmit automatically.

## 2013-08-28 NOTE — Telephone Encounter (Signed)
New message    Patient calling has questions - remote transmitting.

## 2013-09-01 ENCOUNTER — Encounter: Payer: Self-pay | Admitting: *Deleted

## 2013-11-27 ENCOUNTER — Encounter: Payer: Self-pay | Admitting: Internal Medicine

## 2013-11-27 ENCOUNTER — Ambulatory Visit (INDEPENDENT_AMBULATORY_CARE_PROVIDER_SITE_OTHER): Payer: Medicare HMO | Admitting: *Deleted

## 2013-11-27 DIAGNOSIS — I498 Other specified cardiac arrhythmias: Secondary | ICD-10-CM

## 2013-11-27 DIAGNOSIS — R001 Bradycardia, unspecified: Secondary | ICD-10-CM

## 2013-11-27 NOTE — Progress Notes (Signed)
Remote pacemaker transmission.   

## 2013-11-30 LAB — MDC_IDC_ENUM_SESS_TYPE_REMOTE
Battery Remaining Longevity: 92 mo
Battery Remaining Percentage: 74 %
Battery Voltage: 2.95 V
Brady Statistic AP VP Percent: 4.2 %
Brady Statistic AS VP Percent: 1 %
Brady Statistic RA Percent Paced: 82 %
Date Time Interrogation Session: 20150706063519
Implantable Pulse Generator Model: 2210
Implantable Pulse Generator Serial Number: 7382865
Lead Channel Impedance Value: 610 Ohm
Lead Channel Impedance Value: 710 Ohm
Lead Channel Pacing Threshold Amplitude: 0.75 V
Lead Channel Pacing Threshold Pulse Width: 0.5 ms
Lead Channel Pacing Threshold Pulse Width: 0.5 ms
Lead Channel Sensing Intrinsic Amplitude: 2.4 mV
Lead Channel Setting Pacing Amplitude: 2 V
Lead Channel Setting Pacing Amplitude: 2.5 V
Lead Channel Setting Pacing Pulse Width: 0.5 ms
Lead Channel Setting Sensing Sensitivity: 2 mV
MDC IDC MSMT LEADCHNL RV PACING THRESHOLD AMPLITUDE: 1 V
MDC IDC MSMT LEADCHNL RV SENSING INTR AMPL: 10.5 mV
MDC IDC STAT BRADY AP VS PERCENT: 78 %
MDC IDC STAT BRADY AS VS PERCENT: 18 %
MDC IDC STAT BRADY RV PERCENT PACED: 4.2 %

## 2013-12-13 ENCOUNTER — Encounter: Payer: Self-pay | Admitting: Cardiology

## 2014-02-14 ENCOUNTER — Encounter: Payer: Self-pay | Admitting: Internal Medicine

## 2014-02-14 ENCOUNTER — Ambulatory Visit (INDEPENDENT_AMBULATORY_CARE_PROVIDER_SITE_OTHER): Payer: Commercial Managed Care - HMO | Admitting: Internal Medicine

## 2014-02-14 VITALS — BP 118/54 | HR 62 | Ht 67.0 in | Wt 209.4 lb

## 2014-02-14 DIAGNOSIS — I495 Sick sinus syndrome: Secondary | ICD-10-CM

## 2014-02-14 DIAGNOSIS — I498 Other specified cardiac arrhythmias: Secondary | ICD-10-CM

## 2014-02-14 DIAGNOSIS — Z950 Presence of cardiac pacemaker: Secondary | ICD-10-CM

## 2014-02-14 DIAGNOSIS — I480 Paroxysmal atrial fibrillation: Secondary | ICD-10-CM

## 2014-02-14 DIAGNOSIS — R001 Bradycardia, unspecified: Secondary | ICD-10-CM

## 2014-02-14 DIAGNOSIS — I4891 Unspecified atrial fibrillation: Secondary | ICD-10-CM

## 2014-02-14 LAB — MDC_IDC_ENUM_SESS_TYPE_INCLINIC
Battery Remaining Longevity: 110.4 mo
Brady Statistic RA Percent Paced: 83 %
Brady Statistic RV Percent Paced: 6.1 %
Date Time Interrogation Session: 20150923111501
Implantable Pulse Generator Model: 2210
Implantable Pulse Generator Serial Number: 7382865
Lead Channel Impedance Value: 650 Ohm
Lead Channel Pacing Threshold Amplitude: 1 V
Lead Channel Pacing Threshold Amplitude: 1 V
Lead Channel Pacing Threshold Amplitude: 1 V
Lead Channel Pacing Threshold Pulse Width: 0.5 ms
Lead Channel Sensing Intrinsic Amplitude: 1.5 mV
Lead Channel Setting Pacing Amplitude: 2 V
MDC IDC MSMT BATTERY VOLTAGE: 2.95 V
MDC IDC MSMT LEADCHNL RA IMPEDANCE VALUE: 612.5 Ohm
MDC IDC MSMT LEADCHNL RA PACING THRESHOLD PULSEWIDTH: 0.5 ms
MDC IDC MSMT LEADCHNL RV PACING THRESHOLD AMPLITUDE: 1 V
MDC IDC MSMT LEADCHNL RV PACING THRESHOLD PULSEWIDTH: 0.5 ms
MDC IDC MSMT LEADCHNL RV PACING THRESHOLD PULSEWIDTH: 0.5 ms
MDC IDC MSMT LEADCHNL RV SENSING INTR AMPL: 10.8 mV
MDC IDC SET LEADCHNL RV PACING AMPLITUDE: 2.5 V
MDC IDC SET LEADCHNL RV PACING PULSEWIDTH: 0.5 ms
MDC IDC SET LEADCHNL RV SENSING SENSITIVITY: 2 mV

## 2014-02-14 NOTE — Progress Notes (Signed)
PCP: Delphina Cahill, MD Primary Cardiologist: previously Dr Lattie Haw  (lives in [place])  [name] is a 73 y.o. female who presents today for routine electrophysiology followup.  Since her last office visit, the patient reports doing very well.  She has some mild lower extremity edema, controlled with diuretic and stable. Can feel some palpitations but are in her  Usual pattern  and not worrisome. Pacer interrogated today and  reveals low afib burden. Remains on warfarin for chadsvasc of at least 6, some bruising of arms but otherwise stable INR's. Noacs discussed but pt prefers not  to change.    Today, she denies symptoms of   chest pain,   presyncope, or syncope.  The patient is otherwise without complaint today.   Past Medical History  Diagnosis Date  . Pancreatitis     biliary  . CVA (cerebral vascular accident)   . Hypercholesterolemia   . DM (diabetes mellitus)   . HTN (hypertension)   . Hypothyroidism   . GERD (gastroesophageal reflux disease)   . S/P endoscopy March 2009    multiple 3-4 mmsessile gastric polyps, benign, singl antral erosions, negative H.pylori, reactive gastropathy ? NSAIDs  . S/P colonoscopy March 2006    Dr. Olevia Perches: per e-chart report: diverticulosis, path adenomatous polyps  . A-fib   . Colon polyps   . Depression   . Chronic kidney disease   . 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 Cholecystectomy    . Shoulder surgery      left  . Colonoscopy  03/10/2011    Procedure: COLONOSCOPY;  Surgeon: Daneil Dolin, MD;  [facility];  Service: Endoscopy;  Laterality: N/A;  12:45  . Pacemaker insertion  02/05/12    SJM Accent DR RF implanted by Dr [name] for symptomatic bradycardia    Current Outpatient Prescriptions  Medication Sig Dispense Refill  . atorvastatin (LIPITOR) 40 MG tablet Take 40 mg by mouth daily.        . benazepril (LOTENSIN) 20 MG tablet  Take 1 & 1/2 tab daily for total of 30 mg      . calcitRIOL (ROCALTROL) 0.25 MCG capsule Take 2 tabs every other day and take 1 tablet daily on the other days      . citalopram (CELEXA) 20 MG tablet Take 20 mg by mouth daily.        . diclofenac sodium (VOLTAREN) 1 % GEL Apply 1 application topically 4 (four) times daily.      . flecainide (TAMBOCOR) 100 MG tablet Take 100 mg by mouth 2 (two) times daily.        . fluticasone (FLONASE) 50 MCG/ACT nasal spray Place 2 sprays into the nose daily as needed. For congestion      . furosemide (LASIX) 80 MG tablet Take 80 mg by mouth daily.      Marland Kitchen glipiZIDE (GLUCOTROL XL) 10 MG 24 hr tablet Take 10 mg by mouth daily.        Marland Kitchen HYDROcodone-acetaminophen (VICODIN) 5-500 MG per tablet Take 1 tablet by mouth every 6 (six) hours as needed. For pain      . levothyroxine (SYNTHROID, LEVOTHROID) 75 MCG tablet Take 75 mcg by mouth daily.        Marland Kitchen omeprazole (PRILOSEC) 40 MG capsule Take 40 mg by mouth daily.        . vitamin B-12 (CYANOCOBALAMIN) 1000 MCG  tablet Take 5,000 mcg by mouth daily.      Marland Kitchen warfarin (COUMADIN) 5 MG tablet Take as directed by the coumadin clinic      . DHA-EPA-Flaxseed Oil-Vitamin E CAPS Take 1 capsule by mouth 2 (two) times daily.       No current facility-administered medications for this visit.    Physical Exam: Filed Vitals:   02/14/14 0957  BP: 118/54  Pulse: 62  Height: 5\' 7"  (1.702 m)  Weight: 209 lb 6.4 oz (94.983 kg)    GEN- The patient is well appearing, alert and oriented x 3 today.   Head- normocephalic, atraumatic Eyes-  Sclera clear, conjunctiva pink Ears- hearing intact Oropharynx- clear Lungs- Clear to ausculation bilaterally, normal work of breathing Chest- pacemaker pocket is well healed Heart- Regular rate and rhythm, no murmurs, rubs or gallops, PMI not laterally displaced GI- soft, NT, ND, + BS Extremities- no clubbing, cyanosis, or edema  Pacemaker interrogation- reviewed in detail today,  See  PACEART report ekg today reveals sinus rhythm  with first degree AV BLOCK.   Assessment and Plan:  1. Symptomatic sinus node dysfunction Normal pacemaker function See Pace Art report No changes today  2. afib Continue coumadin Maintaining sinus rhythm with flecainide  3. CRI Stable, followed by nephrology   Return in 1 year

## 2014-02-14 NOTE — Patient Instructions (Signed)
Your physician wants you to follow-up in: 12 months with Dr. Vallery Ridge will receive a reminder letter in the mail two months in advance. If you don't receive a letter, please call our office to schedule the follow-up appointment.  Your physician recommends that you continue on your current medications as directed. Please refer to the Current Medication list given to you today.   Thank you for choosing [facility]!!     Janan Halter, RN [phone]

## 2014-02-19 ENCOUNTER — Encounter: Payer: Self-pay | Admitting: Internal Medicine

## 2014-03-07 ENCOUNTER — Encounter: Payer: Self-pay | Admitting: Internal Medicine

## 2014-05-03 ENCOUNTER — Encounter (HOSPITAL_COMMUNITY): Payer: Self-pay | Admitting: Internal Medicine

## 2014-05-21 ENCOUNTER — Ambulatory Visit (INDEPENDENT_AMBULATORY_CARE_PROVIDER_SITE_OTHER): Payer: Commercial Managed Care - HMO | Admitting: *Deleted

## 2014-05-21 DIAGNOSIS — I480 Paroxysmal atrial fibrillation: Secondary | ICD-10-CM | POA: Diagnosis not present

## 2014-05-21 DIAGNOSIS — R001 Bradycardia, unspecified: Secondary | ICD-10-CM

## 2014-05-21 NOTE — Progress Notes (Signed)
Remote pacemaker check. 

## 2014-05-22 LAB — MDC_IDC_ENUM_SESS_TYPE_REMOTE
Battery Remaining Longevity: 92 mo
Battery Remaining Percentage: 74 %
Brady Statistic AP VS Percent: 75 %
Brady Statistic AS VP Percent: 1 %
Brady Statistic AS VS Percent: 21 %
Date Time Interrogation Session: 20151228073714
Implantable Pulse Generator Model: 2210
Implantable Pulse Generator Serial Number: 7382865
Lead Channel Impedance Value: 710 Ohm
Lead Channel Pacing Threshold Amplitude: 1 V
Lead Channel Pacing Threshold Amplitude: 1 V
Lead Channel Pacing Threshold Pulse Width: 0.5 ms
Lead Channel Sensing Intrinsic Amplitude: 2.5 mV
Lead Channel Sensing Intrinsic Amplitude: 9.3 mV
Lead Channel Setting Pacing Amplitude: 2 V
MDC IDC MSMT BATTERY VOLTAGE: 2.95 V
MDC IDC MSMT LEADCHNL RV IMPEDANCE VALUE: 600 Ohm
MDC IDC MSMT LEADCHNL RV PACING THRESHOLD PULSEWIDTH: 0.5 ms
MDC IDC SET LEADCHNL RV PACING AMPLITUDE: 2.5 V
MDC IDC SET LEADCHNL RV PACING PULSEWIDTH: 0.5 ms
MDC IDC SET LEADCHNL RV SENSING SENSITIVITY: 2 mV
MDC IDC STAT BRADY AP VP PERCENT: 4 %
MDC IDC STAT BRADY RA PERCENT PACED: 79 %
MDC IDC STAT BRADY RV PERCENT PACED: 4 %

## 2014-05-30 DIAGNOSIS — I482 Chronic atrial fibrillation: Secondary | ICD-10-CM | POA: Diagnosis not present

## 2014-06-01 ENCOUNTER — Encounter: Payer: Self-pay | Admitting: *Deleted

## 2014-06-14 ENCOUNTER — Encounter: Payer: Self-pay | Admitting: Internal Medicine

## 2014-06-26 DIAGNOSIS — D631 Anemia in chronic kidney disease: Secondary | ICD-10-CM | POA: Diagnosis not present

## 2014-06-26 DIAGNOSIS — N189 Chronic kidney disease, unspecified: Secondary | ICD-10-CM | POA: Diagnosis not present

## 2014-06-26 DIAGNOSIS — N183 Chronic kidney disease, stage 3 (moderate): Secondary | ICD-10-CM | POA: Diagnosis not present

## 2014-06-26 DIAGNOSIS — N2581 Secondary hyperparathyroidism of renal origin: Secondary | ICD-10-CM | POA: Diagnosis not present

## 2014-06-26 DIAGNOSIS — I129 Hypertensive chronic kidney disease with stage 1 through stage 4 chronic kidney disease, or unspecified chronic kidney disease: Secondary | ICD-10-CM | POA: Diagnosis not present

## 2014-06-27 DIAGNOSIS — E559 Vitamin D deficiency, unspecified: Secondary | ICD-10-CM | POA: Diagnosis not present

## 2014-06-27 DIAGNOSIS — I482 Chronic atrial fibrillation: Secondary | ICD-10-CM | POA: Diagnosis not present

## 2014-06-27 DIAGNOSIS — Z79899 Other long term (current) drug therapy: Secondary | ICD-10-CM | POA: Diagnosis not present

## 2014-06-27 DIAGNOSIS — R5383 Other fatigue: Secondary | ICD-10-CM | POA: Diagnosis not present

## 2014-06-27 DIAGNOSIS — E039 Hypothyroidism, unspecified: Secondary | ICD-10-CM | POA: Diagnosis not present

## 2014-06-27 DIAGNOSIS — E119 Type 2 diabetes mellitus without complications: Secondary | ICD-10-CM | POA: Diagnosis not present

## 2014-06-27 DIAGNOSIS — I10 Essential (primary) hypertension: Secondary | ICD-10-CM | POA: Diagnosis not present

## 2014-07-16 DIAGNOSIS — N183 Chronic kidney disease, stage 3 (moderate): Secondary | ICD-10-CM | POA: Diagnosis not present

## 2014-07-27 DIAGNOSIS — Z7901 Long term (current) use of anticoagulants: Secondary | ICD-10-CM | POA: Diagnosis not present

## 2014-07-27 DIAGNOSIS — I482 Chronic atrial fibrillation: Secondary | ICD-10-CM | POA: Diagnosis not present

## 2014-07-27 DIAGNOSIS — E119 Type 2 diabetes mellitus without complications: Secondary | ICD-10-CM | POA: Diagnosis not present

## 2014-08-02 DIAGNOSIS — N183 Chronic kidney disease, stage 3 (moderate): Secondary | ICD-10-CM | POA: Diagnosis not present

## 2014-08-02 DIAGNOSIS — N390 Urinary tract infection, site not specified: Secondary | ICD-10-CM | POA: Diagnosis not present

## 2014-08-02 DIAGNOSIS — I129 Hypertensive chronic kidney disease with stage 1 through stage 4 chronic kidney disease, or unspecified chronic kidney disease: Secondary | ICD-10-CM | POA: Diagnosis not present

## 2014-08-20 ENCOUNTER — Encounter: Payer: Self-pay | Admitting: Internal Medicine

## 2014-08-20 ENCOUNTER — Ambulatory Visit (INDEPENDENT_AMBULATORY_CARE_PROVIDER_SITE_OTHER): Payer: Commercial Managed Care - HMO | Admitting: *Deleted

## 2014-08-20 DIAGNOSIS — I495 Sick sinus syndrome: Secondary | ICD-10-CM

## 2014-08-20 LAB — MDC_IDC_ENUM_SESS_TYPE_REMOTE
Battery Remaining Longevity: 88 mo
Battery Remaining Percentage: 74 %
Battery Voltage: 2.95 V
Brady Statistic AP VS Percent: 76 %
Brady Statistic AS VS Percent: 20 %
Implantable Pulse Generator Model: 2210
Implantable Pulse Generator Serial Number: 7382865
Lead Channel Impedance Value: 530 Ohm
Lead Channel Pacing Threshold Amplitude: 1 V
Lead Channel Pacing Threshold Pulse Width: 0.5 ms
Lead Channel Sensing Intrinsic Amplitude: 8.8 mV
Lead Channel Setting Pacing Amplitude: 2 V
Lead Channel Setting Pacing Amplitude: 2.5 V
MDC IDC MSMT LEADCHNL RA PACING THRESHOLD AMPLITUDE: 1 V
MDC IDC MSMT LEADCHNL RA PACING THRESHOLD PULSEWIDTH: 0.5 ms
MDC IDC MSMT LEADCHNL RA SENSING INTR AMPL: 1.8 mV
MDC IDC MSMT LEADCHNL RV IMPEDANCE VALUE: 580 Ohm
MDC IDC SESS DTM: 20160328060741
MDC IDC SET LEADCHNL RV PACING PULSEWIDTH: 0.5 ms
MDC IDC SET LEADCHNL RV SENSING SENSITIVITY: 2 mV
MDC IDC STAT BRADY AP VP PERCENT: 4.2 %
MDC IDC STAT BRADY AS VP PERCENT: 1 %
MDC IDC STAT BRADY RA PERCENT PACED: 80 %
MDC IDC STAT BRADY RV PERCENT PACED: 4.3 %

## 2014-08-20 NOTE — Progress Notes (Signed)
Remote pacemaker transmission.   

## 2014-08-22 DIAGNOSIS — I482 Chronic atrial fibrillation: Secondary | ICD-10-CM | POA: Diagnosis not present

## 2014-08-22 DIAGNOSIS — Z7901 Long term (current) use of anticoagulants: Secondary | ICD-10-CM | POA: Diagnosis not present

## 2014-08-30 ENCOUNTER — Encounter: Payer: Self-pay | Admitting: Cardiology

## 2014-10-03 DIAGNOSIS — E119 Type 2 diabetes mellitus without complications: Secondary | ICD-10-CM | POA: Diagnosis not present

## 2014-10-03 DIAGNOSIS — Z7901 Long term (current) use of anticoagulants: Secondary | ICD-10-CM | POA: Diagnosis not present

## 2014-10-03 DIAGNOSIS — I10 Essential (primary) hypertension: Secondary | ICD-10-CM | POA: Diagnosis not present

## 2014-10-03 DIAGNOSIS — I482 Chronic atrial fibrillation: Secondary | ICD-10-CM | POA: Diagnosis not present

## 2014-10-31 DIAGNOSIS — I482 Chronic atrial fibrillation: Secondary | ICD-10-CM | POA: Diagnosis not present

## 2014-10-31 DIAGNOSIS — E119 Type 2 diabetes mellitus without complications: Secondary | ICD-10-CM | POA: Diagnosis not present

## 2014-11-16 DIAGNOSIS — I129 Hypertensive chronic kidney disease with stage 1 through stage 4 chronic kidney disease, or unspecified chronic kidney disease: Secondary | ICD-10-CM | POA: Diagnosis not present

## 2014-11-16 DIAGNOSIS — N184 Chronic kidney disease, stage 4 (severe): Secondary | ICD-10-CM | POA: Diagnosis not present

## 2014-11-16 DIAGNOSIS — D631 Anemia in chronic kidney disease: Secondary | ICD-10-CM | POA: Diagnosis not present

## 2014-11-16 DIAGNOSIS — E1129 Type 2 diabetes mellitus with other diabetic kidney complication: Secondary | ICD-10-CM | POA: Diagnosis not present

## 2014-11-19 ENCOUNTER — Encounter: Payer: Self-pay | Admitting: Internal Medicine

## 2014-11-19 ENCOUNTER — Ambulatory Visit (INDEPENDENT_AMBULATORY_CARE_PROVIDER_SITE_OTHER): Payer: Commercial Managed Care - HMO | Admitting: *Deleted

## 2014-11-19 ENCOUNTER — Other Ambulatory Visit: Payer: Self-pay

## 2014-11-19 ENCOUNTER — Telehealth: Payer: Self-pay | Admitting: Internal Medicine

## 2014-11-19 DIAGNOSIS — I495 Sick sinus syndrome: Secondary | ICD-10-CM | POA: Diagnosis not present

## 2014-11-19 NOTE — Progress Notes (Signed)
Remote pacemaker transmission.   

## 2014-11-19 NOTE — Telephone Encounter (Signed)
Informed pt that transmission received.

## 2014-11-19 NOTE — Telephone Encounter (Signed)
Follow Up   Pt is calling to verify her transmission was received. Please call.

## 2014-11-30 LAB — CUP PACEART REMOTE DEVICE CHECK
Battery Remaining Longevity: 106 mo
Brady Statistic AP VS Percent: 78 %
Brady Statistic AS VS Percent: 18 %
Lead Channel Pacing Threshold Amplitude: 1 V
Lead Channel Pacing Threshold Amplitude: 1 V
Lead Channel Pacing Threshold Pulse Width: 0.5 ms
Lead Channel Sensing Intrinsic Amplitude: 9 mV
Lead Channel Setting Pacing Amplitude: 2.5 V
Lead Channel Setting Pacing Pulse Width: 0.5 ms
MDC IDC MSMT BATTERY REMAINING PERCENTAGE: 91 %
MDC IDC MSMT BATTERY VOLTAGE: 2.95 V
MDC IDC MSMT LEADCHNL RA IMPEDANCE VALUE: 480 Ohm
MDC IDC MSMT LEADCHNL RA PACING THRESHOLD PULSEWIDTH: 0.5 ms
MDC IDC MSMT LEADCHNL RA SENSING INTR AMPL: 2.1 mV
MDC IDC MSMT LEADCHNL RV IMPEDANCE VALUE: 590 Ohm
MDC IDC PG SERIAL: 7382865
MDC IDC SESS DTM: 20160627064007
MDC IDC SET LEADCHNL RA PACING AMPLITUDE: 2 V
MDC IDC SET LEADCHNL RV SENSING SENSITIVITY: 2 mV
MDC IDC STAT BRADY AP VP PERCENT: 4.1 %
MDC IDC STAT BRADY AS VP PERCENT: 1 %
MDC IDC STAT BRADY RA PERCENT PACED: 82 %
MDC IDC STAT BRADY RV PERCENT PACED: 4.2 %
Pulse Gen Model: 2210

## 2014-12-05 ENCOUNTER — Encounter: Payer: Self-pay | Admitting: Cardiology

## 2014-12-27 ENCOUNTER — Encounter: Payer: Self-pay | Admitting: Cardiology

## 2015-01-16 DIAGNOSIS — M62838 Other muscle spasm: Secondary | ICD-10-CM | POA: Diagnosis not present

## 2015-01-16 DIAGNOSIS — I482 Chronic atrial fibrillation: Secondary | ICD-10-CM | POA: Diagnosis not present

## 2015-01-16 DIAGNOSIS — E119 Type 2 diabetes mellitus without complications: Secondary | ICD-10-CM | POA: Diagnosis not present

## 2015-01-23 DIAGNOSIS — M9904 Segmental and somatic dysfunction of sacral region: Secondary | ICD-10-CM | POA: Diagnosis not present

## 2015-01-23 DIAGNOSIS — M9901 Segmental and somatic dysfunction of cervical region: Secondary | ICD-10-CM | POA: Diagnosis not present

## 2015-01-23 DIAGNOSIS — M5136 Other intervertebral disc degeneration, lumbar region: Secondary | ICD-10-CM | POA: Diagnosis not present

## 2015-01-23 DIAGNOSIS — M9903 Segmental and somatic dysfunction of lumbar region: Secondary | ICD-10-CM | POA: Diagnosis not present

## 2015-01-23 DIAGNOSIS — M9905 Segmental and somatic dysfunction of pelvic region: Secondary | ICD-10-CM | POA: Diagnosis not present

## 2015-02-04 ENCOUNTER — Other Ambulatory Visit (HOSPITAL_COMMUNITY): Payer: Self-pay | Admitting: Internal Medicine

## 2015-02-04 ENCOUNTER — Ambulatory Visit (HOSPITAL_COMMUNITY)
Admission: RE | Admit: 2015-02-04 | Discharge: 2015-02-04 | Disposition: A | Discharge status: Home / Self Care | Payer: Commercial Managed Care - HMO | Source: Ambulatory Visit | Attending: Internal Medicine | Admitting: Internal Medicine

## 2015-02-04 DIAGNOSIS — M25562 Pain in left knee: Secondary | ICD-10-CM | POA: Diagnosis not present

## 2015-02-04 DIAGNOSIS — M2342 Loose body in knee, left knee: Secondary | ICD-10-CM | POA: Insufficient documentation

## 2015-02-04 DIAGNOSIS — M1712 Unilateral primary osteoarthritis, left knee: Secondary | ICD-10-CM | POA: Diagnosis not present

## 2015-02-04 DIAGNOSIS — I739 Peripheral vascular disease, unspecified: Secondary | ICD-10-CM | POA: Diagnosis not present

## 2015-02-05 ENCOUNTER — Telehealth: Payer: Self-pay | Admitting: Orthopedic Surgery

## 2015-02-05 NOTE — Telephone Encounter (Signed)
WE DONT HAVE APPTS THIS WEEK AND I M NOT SURE ABOUT NEXT WEEK YOU ARE LOOK AND SEE

## 2015-02-05 NOTE — Telephone Encounter (Signed)
Relayed to Cybill at [facility] office that first available appointment would be 02/11/15; patient also aware appointment pending, as Dr. Nevada Crane may wish to re-refer; their office will call us back if wish to schedule.

## 2015-02-05 NOTE — Telephone Encounter (Signed)
Patient's primary care, Dr. Merlyn Albert, and Loma Sousa, Nurse practitioner have referred patient, and request "ASAP" appointment,  for problem of severe left knee pain; patient has just had Xray at [facility]; please review and advise.  Primary care office ph# [phone].

## 2015-02-06 NOTE — Telephone Encounter (Signed)
Called back to Cybill at Dr Juel Burrow office following conversation today regarding possible 02/11/15; relayed that first available appointment date is actually 02/18/15; had spoken with patient prior, and she may be unable to wait; therefore, she will be further advised by Dr Juel Burrow office; we will also be made aware by their office if they would like this appointment.

## 2015-02-13 DIAGNOSIS — E119 Type 2 diabetes mellitus without complications: Secondary | ICD-10-CM | POA: Diagnosis not present

## 2015-02-13 DIAGNOSIS — Z7901 Long term (current) use of anticoagulants: Secondary | ICD-10-CM | POA: Diagnosis not present

## 2015-02-13 DIAGNOSIS — I482 Chronic atrial fibrillation: Secondary | ICD-10-CM | POA: Diagnosis not present

## 2015-02-13 DIAGNOSIS — I10 Essential (primary) hypertension: Secondary | ICD-10-CM | POA: Diagnosis not present

## 2015-02-13 NOTE — Telephone Encounter (Signed)
Spoke with patient and with primary care, Dr. Josue Hector office on 02/11/15 - patient elects to wait to go ahead and schedule appointment for 02/18/15; states wants to see Dr Aline Brochure, since he is her orthopedic doctor.  Scheduled.

## 2015-02-18 ENCOUNTER — Encounter: Payer: Self-pay | Admitting: Orthopedic Surgery

## 2015-02-18 ENCOUNTER — Ambulatory Visit (INDEPENDENT_AMBULATORY_CARE_PROVIDER_SITE_OTHER): Payer: Commercial Managed Care - HMO

## 2015-02-18 ENCOUNTER — Ambulatory Visit (INDEPENDENT_AMBULATORY_CARE_PROVIDER_SITE_OTHER): Payer: Commercial Managed Care - HMO | Admitting: Orthopedic Surgery

## 2015-02-18 VITALS — BP 105/56 | Ht 67.0 in | Wt 191.0 lb

## 2015-02-18 DIAGNOSIS — M5441 Lumbago with sciatica, right side: Secondary | ICD-10-CM

## 2015-02-18 DIAGNOSIS — M5442 Lumbago with sciatica, left side: Secondary | ICD-10-CM

## 2015-02-18 DIAGNOSIS — M5126 Other intervertebral disc displacement, lumbar region: Secondary | ICD-10-CM | POA: Diagnosis not present

## 2015-02-18 DIAGNOSIS — M47817 Spondylosis without myelopathy or radiculopathy, lumbosacral region: Secondary | ICD-10-CM

## 2015-02-18 MED ORDER — GABAPENTIN 100 MG PO CAPS: 100.0000 mg | ORAL_CAPSULE | Freq: Three times a day (TID) | ORAL | Status: DC

## 2015-02-18 MED ORDER — HYDROCODONE-ACETAMINOPHEN 5-325 MG PO TABS: 1.0000 | ORAL_TABLET | Freq: Four times a day (QID) | ORAL | Status: DC | PRN

## 2015-02-18 MED ORDER — CYCLOBENZAPRINE HCL 5 MG PO TABS: 5.0000 mg | ORAL_TABLET | Freq: Three times a day (TID) | ORAL | Status: DC | PRN

## 2015-02-18 NOTE — Progress Notes (Signed)
Patient ID: [name], female   DOB: 06-21-1940, 74 y.o.   MRN: [id number]  New patient   Chief Complaint  Patient presents with  . Knee Pain    left knee pain x 1 month, no known injury, REFERRED BY Z HALL     [name] is a 74 y.o. female.   HPI This 74 year old female presents with a one-month history of pain in her left leg. 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 She's had for x-rays done of her left knee which show severe arthritis possible loose bodies. However, her symptoms began when she was weeding her garden bending over from the waist. Although there was no acute injury the next morning she felt severe pain in her lower back radiating down her left leg into her knee and along the anterolateral lower leg.  She describes no bowel bladder dysfunction but her pain is severe. She was given an injection of sterilely tender hip, she's been on prednisone, Flexeril and Norco which initially did help the symptoms but in the last 2 weeks her pain has gotten worse.  She does not report leg weakness.  (The patient's pain was described as throbbing, burning, stabbing, aching. Constant vacillating between 5 and 7 out of 10).  Review of Systems See hpi  Past Medical History  Diagnosis Date  . Pancreatitis     biliary  . CVA (cerebral vascular accident)   . Hypercholesterolemia   . DM (diabetes mellitus)   . HTN (hypertension)   . Hypothyroidism   . GERD (gastroesophageal reflux disease)   . S/P endoscopy March 2009    multiple 3-4 mmsessile gastric polyps, benign, singl antral erosions, negative H.pylori, reactive gastropathy ? NSAIDs  . S/P colonoscopy March 2006    Dr. Olevia Perches: per e-chart report: diverticulosis, path adenomatous polyps  . A-fib   . Colon polyps   . Depression   . Chronic kidney disease   . 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 Cholecystectomy    . Shoulder surgery      left  . Colonoscopy  03/10/2011    Procedure: COLONOSCOPY;  Surgeon: Daneil Dolin, MD;  [facility];  Service: Endoscopy;  Laterality: N/A;  12:45  . Pacemaker insertion  02/05/12    SJM Accent DR RF implanted by Dr [name] for symptomatic bradycardia  . Permanent pacemaker insertion N/A 02/05/2012    Procedure: PERMANENT PACEMAKER INSERTION;  Surgeon: Thompson Grayer, MD;  Location: [facility] CATH LAB;  Service: Cardiovascular;  Laterality: N/A;    Family History  Problem Relation Age of Onset  . Colon cancer Neg Hx   . Heart disease    . Arthritis    . Lung disease    . Diabetes    . Kidney disease      Social History Social History  Substance Use Topics  . Smoking status: Never Smoker   . Smokeless tobacco: Not on file  . Alcohol Use: No    Allergies  Allergen Reactions  . Other Hives    Florinal for headaches.  . Penicillins Rash  . Tetanus Toxoids Rash    Current Outpatient Prescriptions  Medication Sig Dispense Refill  . atorvastatin (LIPITOR) 40 MG tablet Take 40 mg by mouth daily.      . benazepril (LOTENSIN) 20 MG tablet Take 1 &  1/2 tab daily for total of 30 mg    . calcitRIOL (ROCALTROL) 0.25 MCG capsule Take 2 tabs every other day and take 1 tablet daily on the other days    . citalopram (CELEXA) 20 MG tablet Take 20 mg by mouth daily.      . DHA-EPA-Flaxseed Oil-Vitamin E CAPS Take 1 capsule by mouth 2 (two) times daily.    . diclofenac sodium (VOLTAREN) 1 % GEL Apply 1 application topically 4 (four) times daily.    . flecainide (TAMBOCOR) 100 MG tablet Take 100 mg by mouth 2 (two) times daily.      . fluticasone (FLONASE) 50 MCG/ACT nasal spray Place 2 sprays into the nose daily as needed. For congestion    . furosemide (LASIX) 80 MG tablet Take 80 mg by mouth daily.    Marland Kitchen glipiZIDE (GLUCOTROL XL) 10 MG 24 hr tablet Take 10 mg by mouth daily.      Marland Kitchen HYDROcodone-acetaminophen (VICODIN) 5-500  MG per tablet Take 1 tablet by mouth every 6 (six) hours as needed. For pain    . levothyroxine (SYNTHROID, LEVOTHROID) 75 MCG tablet Take 75 mcg by mouth daily.      Marland Kitchen omeprazole (PRILOSEC) 40 MG capsule Take 40 mg by mouth daily.      . vitamin B-12 (CYANOCOBALAMIN) 1000 MCG tablet Take 5,000 mcg by mouth daily.    Marland Kitchen warfarin (COUMADIN) 5 MG tablet Take as directed by the coumadin clinic     No current facility-administered medications for this visit.       Physical Exam Blood pressure 105/56, height 5\' 7"  (1.702 m), weight 191 lb (86.637 kg). Physical Exam The patient is well developed well nourished and well groomed. Orientation to person place and time is normal  Mood is pleasant. Ambulatory status is normal without a limp Skin remains intact without laceration ulceration or erythema Gross motor exam is intact without atrophy. Muscle tone normal grade 5 motor strength Neurovascular exam decreased sensation at L5 distribution, reflexes are 1+ at the knee 0 at the ankle equal bilaterally opposite straight leg raise is negative  straight leg raise again is positive Lumbar spine alignment seems normal and the seating position. However she has tenderness in the L4-5 disc space and left gluteal area with pain radiating down and reproduced with straight leg raise involving L5 nerve root distribution. She has decreased range of motion of the spine  Her knee range of motion is painless and her need palpation is painless-no joint effusion or instability.    Data Reviewed  The following images have been reviewed and include their reports. I independently interpreted the knee films dated 02/04/2015 as moderate to severe osteoarthritis left knee with possible loose bodies and the MRI as the same. MRI was done 2013  I ordered lumbar spine films and I interpret those as  Lumbar spine 2-3 views  Patient having left leg and back pain  The patient's spine appears to have severe spondylosis  throughout the lumbar spine at multiple levels.  Lateral x-ray shows excessive lordosis  L5-S1 joint space narrowing at the disc space. L4-5 joint space narrowing as well. Anterior calcification of the aortic vessel. Severe spondylosis in the facet joints. T12-L1 anterior osteophyte superior and inferior creating synostosis.  Spot film clearly delineates these findings in the L4-5 and L5-S1 regions  Impression severe spondylosis lumbar spine  Assessment   Encounter Diagnosis  Name Primary?  . Lumbar herniated disc Yes    Plan   The  patient is on warfarin therapy and therefore I think we'll have to keep her on prednisone combined with Flexeril and Norco every 6 and gabapentin 100 3 times a day and obtain an MRI to image the spine for epidural injections as she has not responded to medical therapy and is probably not a good surgical candidate based on her atrial fibrillation and pacemaker.

## 2015-02-18 NOTE — Patient Instructions (Addendum)
We will schedule CT for you and call you with results, will schedule follow up appt for after CT  Two medications sent to your pharmacy

## 2015-02-21 DIAGNOSIS — N2581 Secondary hyperparathyroidism of renal origin: Secondary | ICD-10-CM | POA: Diagnosis not present

## 2015-02-21 DIAGNOSIS — D631 Anemia in chronic kidney disease: Secondary | ICD-10-CM | POA: Diagnosis not present

## 2015-02-21 DIAGNOSIS — I129 Hypertensive chronic kidney disease with stage 1 through stage 4 chronic kidney disease, or unspecified chronic kidney disease: Secondary | ICD-10-CM | POA: Diagnosis not present

## 2015-02-21 DIAGNOSIS — E1129 Type 2 diabetes mellitus with other diabetic kidney complication: Secondary | ICD-10-CM | POA: Diagnosis not present

## 2015-02-21 DIAGNOSIS — N184 Chronic kidney disease, stage 4 (severe): Secondary | ICD-10-CM | POA: Diagnosis not present

## 2015-02-26 ENCOUNTER — Other Ambulatory Visit: Payer: Self-pay

## 2015-02-27 ENCOUNTER — Encounter: Payer: Self-pay | Admitting: Internal Medicine

## 2015-02-27 ENCOUNTER — Ambulatory Visit (INDEPENDENT_AMBULATORY_CARE_PROVIDER_SITE_OTHER): Payer: Commercial Managed Care - HMO | Admitting: Internal Medicine

## 2015-02-27 VITALS — BP 120/68 | HR 68 | Ht 67.0 in | Wt 199.6 lb

## 2015-02-27 DIAGNOSIS — I10 Essential (primary) hypertension: Secondary | ICD-10-CM | POA: Diagnosis not present

## 2015-02-27 DIAGNOSIS — Z950 Presence of cardiac pacemaker: Secondary | ICD-10-CM | POA: Diagnosis not present

## 2015-02-27 DIAGNOSIS — I480 Paroxysmal atrial fibrillation: Secondary | ICD-10-CM

## 2015-02-27 DIAGNOSIS — R001 Bradycardia, unspecified: Secondary | ICD-10-CM

## 2015-02-27 LAB — CUP PACEART INCLINIC DEVICE CHECK
Battery Voltage: 2.95 V
Lead Channel Impedance Value: 575 Ohm
Lead Channel Pacing Threshold Amplitude: 0.75 V
Lead Channel Pacing Threshold Amplitude: 1.25 V
Lead Channel Pacing Threshold Amplitude: 1.25 V
Lead Channel Pacing Threshold Pulse Width: 0.5 ms
Lead Channel Pacing Threshold Pulse Width: 0.5 ms
Lead Channel Sensing Intrinsic Amplitude: 3 mV
Lead Channel Setting Pacing Amplitude: 2.5 V
Lead Channel Setting Sensing Sensitivity: 2 mV
MDC IDC MSMT BATTERY REMAINING LONGEVITY: 114 mo
MDC IDC MSMT LEADCHNL RA PACING THRESHOLD AMPLITUDE: 0.75 V
MDC IDC MSMT LEADCHNL RA PACING THRESHOLD PULSEWIDTH: 0.5 ms
MDC IDC MSMT LEADCHNL RA PACING THRESHOLD PULSEWIDTH: 0.5 ms
MDC IDC MSMT LEADCHNL RV IMPEDANCE VALUE: 600 Ohm
MDC IDC MSMT LEADCHNL RV SENSING INTR AMPL: 11.1 mV
MDC IDC SESS DTM: 20161005104334
MDC IDC SET LEADCHNL RA PACING AMPLITUDE: 2 V
MDC IDC SET LEADCHNL RV PACING PULSEWIDTH: 0.5 ms
MDC IDC STAT BRADY RA PERCENT PACED: 83 %
MDC IDC STAT BRADY RV PERCENT PACED: 7.2 %
Pulse Gen Model: 2210
Pulse Gen Serial Number: 7382865

## 2015-02-27 NOTE — Patient Instructions (Signed)
Medication Instructions:  Your physician recommends that you continue on your current medications as directed. Please refer to the Current Medication list given to you today.   Labwork: None ordered   Testing/Procedures: None ordered   Follow-Up: Remote monitoring is used to monitor your Pacemaker  from home. This monitoring reduces the number of office visits required to check your device to one time per year. It allows [place] to keep an eye on the functioning of your device to ensure it is working properly. You are scheduled for a device check from home on 05/29/15. You may send your transmission at any time that day. If you have a wireless device, the transmission will be sent automatically. After your physician reviews your transmission, you will receive a postcard with your next transmission date.   Your physician wants you to follow-up in: 12 months with Dr Vallery Ridge will receive a reminder letter in the mail two months in advance. If you don't receive a letter, please call our office to schedule the follow-up appointment.   Any Other Special Instructions Will Be Listed Below (If Applicable).

## 2015-02-27 NOTE — Progress Notes (Signed)
PCP: [name], MD Primary Cardiologist: previously Dr Lattie Haw  (lives in [place])  [name] is a 74 y.o. female who presents today for routine electrophysiology followup.  Since her last office visit, the patient reports doing very well.  She is unaware of any afib since her last visit.  Remains active. Today, she denies symptoms of exertional chest pain,  presyncope, or syncope.  The patient is otherwise without complaint today.   Past Medical History  Diagnosis Date  . Pancreatitis     biliary  . CVA (cerebral vascular accident) ([place])   . Hypercholesterolemia   . DM (diabetes mellitus) ([place])   . HTN (hypertension)   . Hypothyroidism   . GERD (gastroesophageal reflux disease)   . S/P endoscopy March 2009    multiple 3-4 mmsessile gastric polyps, benign, singl antral erosions, negative H.pylori, reactive gastropathy ? NSAIDs  . S/P colonoscopy March 2006    Dr. Olevia Perches: per e-chart report: diverticulosis, path adenomatous polyps  . A-fib ([place])   . Colon polyps   . Depression   . Chronic kidney disease   . 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 Cholecystectomy    . Shoulder surgery      left  . Colonoscopy  03/10/2011    Procedure: COLONOSCOPY;  Surgeon: Daneil Dolin, MD;  [facility];  Service: Endoscopy;  Laterality: N/A;  12:45  . Pacemaker insertion  02/05/12    SJM Accent DR RF implanted by Dr [name] for symptomatic bradycardia  . Permanent pacemaker insertion N/A 02/05/2012    Procedure: PERMANENT PACEMAKER INSERTION;  Surgeon: Thompson Grayer, MD;  Location: [facility] CATH LAB;  Service: Cardiovascular;  Laterality: N/A;    Current Outpatient Prescriptions  Medication Sig Dispense Refill  . atorvastatin (LIPITOR) 40 MG tablet Take 40 mg by mouth daily.      . benazepril (LOTENSIN) 40 MG tablet Take 20 mg by mouth daily.     . calcitRIOL (ROCALTROL) 0.25 MCG capsule Take 2  tablets by mouth every other day and take 1 tablet daily on the other days    . citalopram (CELEXA) 20 MG tablet Take 20 mg by mouth daily.      . cyclobenzaprine (FLEXERIL) 10 MG tablet Take 10 mg by mouth every 8 (eight) hours as needed for muscle spasms.     . DHA-EPA-Flaxseed Oil-Vitamin E CAPS Take 1 capsule by mouth 2 (two) times daily.    . diclofenac sodium (VOLTAREN) 1 % GEL Apply 1 application topically 4 (four) times daily.    . flecainide (TAMBOCOR) 100 MG tablet Take 100 mg by mouth 2 (two) times daily.      . fluticasone (FLONASE) 50 MCG/ACT nasal spray Place 2 sprays into the nose daily as needed. For congestion    . furosemide (LASIX) 80 MG tablet Take 80 mg by mouth daily.    Marland Kitchen gabapentin (NEURONTIN) 100 MG capsule Take 1 capsule (100 mg total) by mouth 3 (three) times daily. 90 capsule 1  . glipiZIDE (GLUCOTROL XL) 10 MG 24 hr tablet Take 10 mg by mouth daily.      Marland Kitchen HYDROcodone-acetaminophen (VICODIN) 5-500 MG per tablet Take 1 tablet by mouth every 6 (six) hours as needed. For pain    . levothyroxine (SYNTHROID, LEVOTHROID) 75 MCG tablet Take 75 mcg by mouth daily.      Marland Kitchen omeprazole (PRILOSEC) 40 MG capsule Take  40 mg by mouth daily.      . vitamin B-12 (CYANOCOBALAMIN) 1000 MCG tablet Take 5,000 mcg by mouth daily.    Marland Kitchen warfarin (COUMADIN) 5 MG tablet Take as directed by the coumadin clinic     No current facility-administered medications for this visit.    Physical Exam: Filed Vitals:   02/27/15 1034  BP: 120/68  Pulse: 68  Height: 5\' 7"  (1.702 m)  Weight: 199 lb 9.6 oz (90.538 kg)    GEN- The patient is well appearing, alert and oriented x 3 today.   Head- normocephalic, atraumatic Eyes-  Sclera clear, conjunctiva pink Ears- hearing intact Oropharynx- clear Lungs- Clear to ausculation bilaterally, normal work of breathing Chest- pacemaker pocket is well healed Heart- Regular rate and rhythm, no murmurs, rubs or gallops, PMI not laterally displaced GI- soft,  NT, ND, + BS Extremities- no clubbing, cyanosis, or edema  Pacemaker interrogation- reviewed in detail today,  See PACEART report ekg today reveals sinus rhythm  with first degree AV BLOCK.   Assessment and Plan:  1. Symptomatic sinus node dysfunction Normal pacemaker function See Pace Art report No changes today  2. afib Continue coumadin. Today, I discussed Coumadin as well as novel anticoagulants including Pradaxa, Xarelto, and Eliquis today as indicated for risk reduction in stroke and systemic emboli with nonvalvular atrial fibrillation.  Risks, benefits, and alternatives to each of these drugs were discussed at length today.  She is clear that she would like to remain on coumadin at this time. Maintaining sinus rhythm with flecainide.  Only a single 10 minute nocturnal episode since last visit.  3. CRI Stable, followed by nephrology  [place] remote monitoring Return in 1 year  Thompson Grayer MD, [facility] 02/27/2015 10:57 AM

## 2015-03-01 ENCOUNTER — Other Ambulatory Visit: Payer: Self-pay | Admitting: Orthopedic Surgery

## 2015-03-04 ENCOUNTER — Ambulatory Visit (HOSPITAL_COMMUNITY)
Admission: RE | Admit: 2015-03-04 | Discharge: 2015-03-04 | Disposition: A | Discharge status: Home / Self Care | Payer: Commercial Managed Care - HMO | Source: Ambulatory Visit | Attending: Orthopedic Surgery | Admitting: Orthopedic Surgery

## 2015-03-04 DIAGNOSIS — M4186 Other forms of scoliosis, lumbar region: Secondary | ICD-10-CM | POA: Diagnosis not present

## 2015-03-04 DIAGNOSIS — M79605 Pain in left leg: Secondary | ICD-10-CM | POA: Diagnosis not present

## 2015-03-04 DIAGNOSIS — M545 Low back pain: Secondary | ICD-10-CM | POA: Insufficient documentation

## 2015-03-04 DIAGNOSIS — M4806 Spinal stenosis, lumbar region: Secondary | ICD-10-CM | POA: Diagnosis not present

## 2015-03-04 DIAGNOSIS — M5136 Other intervertebral disc degeneration, lumbar region: Secondary | ICD-10-CM | POA: Diagnosis not present

## 2015-03-04 DIAGNOSIS — M5126 Other intervertebral disc displacement, lumbar region: Secondary | ICD-10-CM

## 2015-03-06 DIAGNOSIS — E119 Type 2 diabetes mellitus without complications: Secondary | ICD-10-CM | POA: Diagnosis not present

## 2015-03-06 DIAGNOSIS — E039 Hypothyroidism, unspecified: Secondary | ICD-10-CM | POA: Diagnosis not present

## 2015-03-06 DIAGNOSIS — I482 Chronic atrial fibrillation: Secondary | ICD-10-CM | POA: Diagnosis not present

## 2015-03-06 DIAGNOSIS — N183 Chronic kidney disease, stage 3 (moderate): Secondary | ICD-10-CM | POA: Diagnosis not present

## 2015-03-06 DIAGNOSIS — E785 Hyperlipidemia, unspecified: Secondary | ICD-10-CM | POA: Diagnosis not present

## 2015-03-06 DIAGNOSIS — I10 Essential (primary) hypertension: Secondary | ICD-10-CM | POA: Diagnosis not present

## 2015-03-06 DIAGNOSIS — Z23 Encounter for immunization: Secondary | ICD-10-CM | POA: Diagnosis not present

## 2015-03-11 ENCOUNTER — Ambulatory Visit (INDEPENDENT_AMBULATORY_CARE_PROVIDER_SITE_OTHER): Payer: Commercial Managed Care - HMO | Admitting: Orthopedic Surgery

## 2015-03-11 ENCOUNTER — Telehealth: Payer: Self-pay | Admitting: *Deleted

## 2015-03-11 VITALS — BP 89/51 | Ht 67.0 in | Wt 199.6 lb

## 2015-03-11 DIAGNOSIS — M48061 Spinal stenosis, lumbar region without neurogenic claudication: Secondary | ICD-10-CM

## 2015-03-11 DIAGNOSIS — M1712 Unilateral primary osteoarthritis, left knee: Secondary | ICD-10-CM

## 2015-03-11 DIAGNOSIS — M4806 Spinal stenosis, lumbar region: Secondary | ICD-10-CM

## 2015-03-11 DIAGNOSIS — M5126 Other intervertebral disc displacement, lumbar region: Secondary | ICD-10-CM

## 2015-03-11 MED ORDER — HYDROCODONE-ACETAMINOPHEN 5-325 MG PO TABS
1.0000 | ORAL_TABLET | Freq: Four times a day (QID) | ORAL | Status: DC | PRN
Start: 2015-03-11 — End: 2016-08-31

## 2015-03-11 NOTE — Telephone Encounter (Signed)
DR Aline Brochure RECOMMENDS REFERRAL TO [facility]  PATIENT'S INSURANCE REQUIRES REFERRAL FROM PCP  RECOMMENDATION FAXED TO PCP WITH OFFICE NOTES

## 2015-03-11 NOTE — Patient Instructions (Addendum)
WE WILL REFER TO [facility]  Joint Injection Care After Refer to this sheet in the next few days. These instructions provide you with information on caring for yourself after you have had a joint injection. Your caregiver also may give you more specific instructions. Your treatment has been planned according to current medical practices, but problems sometimes occur. Call your caregiver if you have any problems or questions after your procedure. After any type of joint injection, it is not uncommon to experience:  Soreness, swelling, or bruising around the injection site.  Mild numbness, tingling, or weakness around the injection site caused by the numbing medicine used before or with the injection. It also is possible to experience the following effects associated with the specific agent after injection:  Iodine-based contrast agents:  Allergic reaction (itching, hives, widespread redness, and swelling beyond the injection site).  Corticosteroids (These effects are rare.):  Allergic reaction.  Increased blood sugar levels (If you have diabetes and you notice that your blood sugar levels have increased, notify your caregiver).  Increased blood pressure levels.  Mood swings.  Hyaluronic acid in the use of viscosupplementation.  Temporary heat or redness.  Temporary rash and itching.  Increased fluid accumulation in the injected joint. These effects all should resolve within a day after your procedure.  HOME CARE INSTRUCTIONS  Limit yourself to light activity the day of your procedure. Avoid lifting heavy objects, bending, stooping, or twisting.  Take prescription or over-the-counter pain medication as directed by your caregiver.  You may apply ice to your injection site to reduce pain and swelling the day of your procedure. Ice may be applied 03-04 times:  Put ice in a plastic bag.  Place a towel between your skin and the bag.  Leave the ice on for no longer  than 15-20 minutes each time. SEEK IMMEDIATE MEDICAL CARE IF:   Pain and swelling get worse rather than better or extend beyond the injection site.  Numbness does not go away.  Blood or fluid continues to leak from the injection site.  You have chest pain.  You have swelling of your face or tongue.  You have trouble breathing or you become dizzy.  You develop a fever, chills, or severe tenderness at the injection site that last longer than 1 day. MAKE SURE YOU:  Understand these instructions.  Watch your condition.  Get help right away if you are not doing well or if you get worse. Document Released: 01/22/2011 Document Revised: 08/03/2011 Document Reviewed: 01/22/2011 [facility] Patient Information 2015 [place], [place]. This information is not intended to replace advice given to you by your health care provider. Make sure you discuss any questions you have with your health care provider.

## 2015-03-11 NOTE — Progress Notes (Signed)
Patient ID: [name], female   DOB: [birth year], 74 y.o.   MRN: [id number]  Follow up visit  Chief Complaint  Patient presents with  . Follow-up    CT LSPINE RESULT REVIEW   HPI This 74 year old female presents with a one-month history of pain in her left leg. 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 She's had for x-rays done of her left knee which show severe arthritis possible loose bodies. However, her symptoms began when she was weeding her garden bending over from the waist. Although there was no acute injury the next morning she felt severe pain in her lower back radiating down her left leg into her knee and along the anterolateral lower leg.  She describes no bowel bladder dysfunction but her pain is severe. She was given an injection of sterilely tender hip, she's been on prednisone, Flexeril and Norco which initially did help the symptoms but in the last 2 weeks her pain has gotten worse.  She does not report leg weakness.  (The patient's pain was described as throbbing, burning, stabbing, aching. Constant vacillating between 5 and 7 out of 10).  The patient has improved with oral steroids, Flexeril, Norco 5 mg and gabapentin.   Past Medical History  Diagnosis Date  . Pancreatitis     biliary  . CVA (cerebral vascular accident) ([place])   . Hypercholesterolemia   . DM (diabetes mellitus) ([place])   . HTN (hypertension)   . Hypothyroidism   . GERD (gastroesophageal reflux disease)   . S/P endoscopy March 2009    multiple 3-4 mmsessile gastric polyps, benign, singl antral erosions, negative H.pylori, reactive gastropathy ? NSAIDs  . S/P colonoscopy March 2006    Dr. Olevia Perches: per e-chart report: diverticulosis, path adenomatous polyps  . A-fib ([place])   . Colon polyps   . Depression   . Chronic kidney disease   . 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    Today she is actually complaining of left  and right knee pain with the right knee occasionally catching and locking but better with ambulation and medial left mild to moderate knee pain with medial joint line symptoms as well Review of systems bowel and bladder function remain intact, anterior thigh pain lateral thigh pain posterior thigh pain lower back pain   BP 89/51 mmHg  Ht 5\' 7"  (1.702 m)  Wt 199 lb 9.6 oz (90.538 kg)  BMI 31.25 kg/m2 Normal grooming hygiene patient oriented 3 mood and affect normal she is ambulatory with a slight limp favoring the left leg  Left knee medial joint line tenderness no effusion, knee flexion 120, motor exam normal. Stability test normal coronal and sagittal plane with normal sensation and pulses.  Right knee no effusion medial joint line tenderness. Full range of motion. Motor exam grade 5 motor strength in the quadriceps musculature. Collateral ligaments and cruciate ligaments are stable. Neurovascular exam is intact.    I used a model to explain the spinal stenosis and the L2-3 paracentral disc herniation and have recommended that she see neurosurgery for consultation. I recommended [facility] because she does have a valve and Dr. Carloyn Manner would probably not intervene surgically at [facility] with that history.  I will continue see her for her knee and gave her an injection in her knee and continued her on Norco and gabapentin and Flexeril  Procedure note left knee injection verbal consent was obtained  to inject left knee joint  Timeout was completed to confirm the site of injection  The medications used were 40 mg of Depo-Medrol and 1% lidocaine 3 cc  Anesthesia was provided by ethyl chloride and the skin was prepped with alcohol.  After cleaning the skin with alcohol a 20-gauge needle was used to inject the left knee joint. There were no complications. A sterile bandage was applied.   Encounter Diagnoses  Name Primary?  . HNP (herniated nucleus pulposus), lumbar Yes  .  Spinal stenosis of lumbar region   . Primary osteoarthritis of left knee      CT scan report read as IMPRESSION: 1. Advanced degenerative disc and facet disease with scoliosis. 2. Probable L2-3 left paracentral disc herniation with L3 impingement in the subarticular recess. 3. Spinal canal stenosis is moderate to advanced at L3-4 and L4-5.     Electronically Signed   By: Monte Fantasia M.D.   On: 03/04/2015 16:11    My independent interpretation of the CT scan is that the patient has spinal stenosis with an L2-3 paracentral disc herniation and advanced degenerative disc disease.

## 2015-03-13 DIAGNOSIS — E785 Hyperlipidemia, unspecified: Secondary | ICD-10-CM | POA: Diagnosis not present

## 2015-03-13 DIAGNOSIS — E119 Type 2 diabetes mellitus without complications: Secondary | ICD-10-CM | POA: Diagnosis not present

## 2015-03-13 DIAGNOSIS — I10 Essential (primary) hypertension: Secondary | ICD-10-CM | POA: Diagnosis not present

## 2015-03-13 DIAGNOSIS — I482 Chronic atrial fibrillation: Secondary | ICD-10-CM | POA: Diagnosis not present

## 2015-03-25 ENCOUNTER — Other Ambulatory Visit: Payer: Self-pay | Admitting: *Deleted

## 2015-03-25 MED ORDER — GABAPENTIN 100 MG PO CAPS: 100.0000 mg | ORAL_CAPSULE | Freq: Three times a day (TID) | ORAL | Status: DC

## 2015-04-05 ENCOUNTER — Other Ambulatory Visit (HOSPITAL_COMMUNITY): Payer: Self-pay | Admitting: Internal Medicine

## 2015-04-05 DIAGNOSIS — Z1231 Encounter for screening mammogram for malignant neoplasm of breast: Secondary | ICD-10-CM

## 2015-04-11 ENCOUNTER — Ambulatory Visit (HOSPITAL_COMMUNITY)
Admission: RE | Admit: 2015-04-11 | Discharge: 2015-04-11 | Disposition: A | Discharge status: Home / Self Care | Payer: Commercial Managed Care - HMO | Source: Ambulatory Visit | Attending: Internal Medicine | Admitting: Internal Medicine

## 2015-04-11 DIAGNOSIS — Z1231 Encounter for screening mammogram for malignant neoplasm of breast: Secondary | ICD-10-CM | POA: Insufficient documentation

## 2015-04-11 DIAGNOSIS — M549 Dorsalgia, unspecified: Secondary | ICD-10-CM | POA: Diagnosis not present

## 2015-04-11 DIAGNOSIS — Z6831 Body mass index (BMI) 31.0-31.9, adult: Secondary | ICD-10-CM | POA: Diagnosis not present

## 2015-04-24 DIAGNOSIS — I482 Chronic atrial fibrillation: Secondary | ICD-10-CM | POA: Diagnosis not present

## 2015-04-24 DIAGNOSIS — I10 Essential (primary) hypertension: Secondary | ICD-10-CM | POA: Diagnosis not present

## 2015-04-30 ENCOUNTER — Other Ambulatory Visit: Payer: Self-pay | Admitting: *Deleted

## 2015-04-30 MED ORDER — CYCLOBENZAPRINE HCL 5 MG PO TABS: 5.0000 mg | ORAL_TABLET | Freq: Three times a day (TID) | ORAL | Status: DC | PRN

## 2015-05-14 ENCOUNTER — Ambulatory Visit: Payer: Commercial Managed Care - HMO | Admitting: Orthopedic Surgery

## 2015-05-23 ENCOUNTER — Ambulatory Visit (INDEPENDENT_AMBULATORY_CARE_PROVIDER_SITE_OTHER): Payer: Commercial Managed Care - HMO | Admitting: Orthopedic Surgery

## 2015-05-23 ENCOUNTER — Encounter: Payer: Self-pay | Admitting: Orthopedic Surgery

## 2015-05-23 VITALS — BP 110/54 | Ht 67.0 in | Wt 199.0 lb

## 2015-05-23 DIAGNOSIS — M4806 Spinal stenosis, lumbar region: Secondary | ICD-10-CM

## 2015-05-23 DIAGNOSIS — M1712 Unilateral primary osteoarthritis, left knee: Secondary | ICD-10-CM | POA: Diagnosis not present

## 2015-05-23 DIAGNOSIS — M48061 Spinal stenosis, lumbar region without neurogenic claudication: Secondary | ICD-10-CM

## 2015-05-23 NOTE — Progress Notes (Signed)
Reevaluate left knee  Patient went to see the neurosurgeon and he is scheduling her for a myelogram  She says her back is much better but her knee is hurting. Primarily a dull achy pain over the lateral joint line  No mechanical symptoms  Vital signs are stable BP 110/54 mmHg  Ht 5\' 7"  (1.702 m)  Wt 199 lb (90.266 kg)  BMI 31.16 kg/m2  Gen. appearance is well groomed, oriented 3 mood normal. A military status normal with no assistive devices. Medial joint line nontender lateral joint line tender lateral iliotibial band tender joint flexion 115 knee stable motor exam normal skin intact pulses good.  Past Medical History  Diagnosis Date  . Pancreatitis     biliary  . CVA (cerebral vascular accident) ([place])   . Hypercholesterolemia   . DM (diabetes mellitus) ([place])   . HTN (hypertension)   . Hypothyroidism   . GERD (gastroesophageal reflux disease)   . S/P endoscopy March 2009    multiple 3-4 mmsessile gastric polyps, benign, singl antral erosions, negative H.pylori, reactive gastropathy ? NSAIDs  . S/P colonoscopy March 2006    Dr. Olevia Perches: per e-chart report: diverticulosis, path adenomatous polyps  . A-fib ([place])   . Colon polyps   . Depression   . Chronic kidney disease   . 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 Jude valve and Coumadin therapy etc.  She's not taking any medication at this point and does not want to take any for pain  Follow-up 3 months  Diagnosis osteoarthritis primarily left knee  Injection  Procedure note left knee injection verbal consent was obtained to inject left knee joint  Timeout was completed to confirm the site of injection  The medications used were 40 mg of Depo-Medrol and 1% lidocaine 3 cc  Anesthesia was provided by ethyl chloride  and the skin was prepped with alcohol.  After cleaning the skin with alcohol a 20-gauge needle was used to inject the left knee joint. There were no complications. A sterile bandage was applied.

## 2015-05-29 DIAGNOSIS — E119 Type 2 diabetes mellitus without complications: Secondary | ICD-10-CM | POA: Diagnosis not present

## 2015-05-29 DIAGNOSIS — I10 Essential (primary) hypertension: Secondary | ICD-10-CM | POA: Diagnosis not present

## 2015-05-29 DIAGNOSIS — I482 Chronic atrial fibrillation: Secondary | ICD-10-CM | POA: Diagnosis not present

## 2015-06-17 ENCOUNTER — Encounter: Payer: Self-pay | Admitting: Sports Medicine

## 2015-06-17 ENCOUNTER — Ambulatory Visit: Payer: Commercial Managed Care - HMO | Admitting: Sports Medicine

## 2015-06-17 ENCOUNTER — Ambulatory Visit (INDEPENDENT_AMBULATORY_CARE_PROVIDER_SITE_OTHER): Payer: Commercial Managed Care - HMO | Admitting: Sports Medicine

## 2015-06-17 ENCOUNTER — Ambulatory Visit (INDEPENDENT_AMBULATORY_CARE_PROVIDER_SITE_OTHER): Payer: Commercial Managed Care - HMO

## 2015-06-17 DIAGNOSIS — L97529 Non-pressure chronic ulcer of other part of left foot with unspecified severity: Secondary | ICD-10-CM

## 2015-06-17 DIAGNOSIS — M7751 Other enthesopathy of right foot: Secondary | ICD-10-CM

## 2015-06-17 DIAGNOSIS — E11621 Type 2 diabetes mellitus with foot ulcer: Secondary | ICD-10-CM | POA: Diagnosis not present

## 2015-06-17 DIAGNOSIS — M779 Enthesopathy, unspecified: Secondary | ICD-10-CM

## 2015-06-17 DIAGNOSIS — M778 Other enthesopathies, not elsewhere classified: Secondary | ICD-10-CM

## 2015-06-17 DIAGNOSIS — M79673 Pain in unspecified foot: Secondary | ICD-10-CM

## 2015-06-17 MED ORDER — TRIAMCINOLONE ACETONIDE 10 MG/ML IJ SUSP: 10.0000 mg | Freq: Once | INTRAMUSCULAR | Status: DC

## 2015-06-17 NOTE — Progress Notes (Signed)
Patient ID: [name], female   DOB: [birth year], 75 y.o.   MRN: [id number] Subjective: [name] is a 75 y.o. female patient seen in office for evaluation of ulceration of the Left 4th toe and right dorsal foot pain that is new in nature. Reports that ulceration started out as blister and present for 2 months on left and right foot hurting since the weekend; does not recall any injury or causative factor. Patient has a history of diabetes and a blood glucose level  today of 98 mg/dl.   Patient is changing the dressing using neosporin at home. Denies nausea/fever/vomiting/chills/night sweats/shortness of breath/pain. Patient has no other pedal complaints at this time.  Patient Active Problem List   Diagnosis Date Noted  . Pacemaker-St.Jude 02/09/2012  . Symptomatic bradycardia 02/04/2012  . Atrial fibrillation ([place]) 02/04/2012  . Medial meniscus, posterior horn derangement 11/16/2011  . Osteoarthritis, knee 11/16/2011  . Loose, body, joint, knee 11/16/2011  . Osteoarthritis of left knee 11/04/2011  . Medial meniscus tear 11/04/2011  . Adenomatous polyps 02/19/2011  . Acute ill-defined cerebrovascular disease 04/25/2007  . A-fib ([place]) 04/25/2007  . Clinical depression 04/25/2007  . HLD (hyperlipidemia) 04/25/2007  . Diabetes ([place]) 04/25/2007  . 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 Essential (primary) hypertension 04/25/2007  . Adult hypothyroidism 04/25/2007   Current Outpatient Prescriptions on File Prior to Visit  Medication Sig Dispense Refill  . atorvastatin (LIPITOR) 40 MG tablet Take 40 mg by mouth daily.      . benazepril (LOTENSIN) 40 MG tablet Take 20 mg by mouth daily.     . calcitRIOL (ROCALTROL) 0.25 MCG capsule Take 2 tablets by mouth every other day and take 1 tablet daily on the other days    . citalopram (CELEXA) 20 MG tablet Take 20 mg by mouth daily.      . cyclobenzaprine (FLEXERIL) 10 MG tablet Take 10 mg by mouth  every 8 (eight) hours as needed for muscle spasms.     . cyclobenzaprine (FLEXERIL) 5 MG tablet Take 1 tablet (5 mg total) by mouth every 8 (eight) hours as needed for muscle spasms. 42 tablet 5  . DHA-EPA-Flaxseed Oil-Vitamin E CAPS Take 1 capsule by mouth 2 (two) times daily.    . diclofenac sodium (VOLTAREN) 1 % GEL Apply 1 application topically 4 (four) times daily.    . flecainide (TAMBOCOR) 100 MG tablet Take 100 mg by mouth 2 (two) times daily.      . fluticasone (FLONASE) 50 MCG/ACT nasal spray Place 2 sprays into the nose daily as needed. For congestion    . furosemide (LASIX) 80 MG tablet Take 80 mg by mouth daily.    Marland Kitchen gabapentin (NEURONTIN) 100 MG capsule Take 1 capsule (100 mg total) by mouth 3 (three) times daily. 90 capsule 5  . glipiZIDE (GLUCOTROL XL) 10 MG 24 hr tablet Take 10 mg by mouth daily.      Marland Kitchen HYDROcodone-acetaminophen (NORCO/VICODIN) 5-325 MG tablet Take 1 tablet by mouth every 6 (six) hours as needed for moderate pain. 56 tablet 0  . HYDROcodone-acetaminophen (VICODIN) 5-500 MG per tablet Take 1 tablet by mouth every 6 (six) hours as needed. For pain    . levothyroxine (SYNTHROID, LEVOTHROID) 75 MCG tablet Take 75 mcg by mouth daily.      Marland Kitchen omeprazole (PRILOSEC) 40 MG capsule Take 40 mg by mouth daily.      . vitamin B-12 (CYANOCOBALAMIN) 1000 MCG tablet Take 5,000 mcg by mouth daily.    Marland Kitchen  warfarin (COUMADIN) 5 MG tablet Take as directed by the coumadin clinic     No current facility-administered medications on file prior to visit.   Allergies  Allergen Reactions  . Other Hives    Florinal for headaches.  . Penicillins Rash  . Tetanus Toxoids Rash     Objective: General: Patient is awake, alert, oriented x 3 and in no acute distress.  Dermatology: Skin is warm and dry bilateral with a partial thickness ulceration present Left 4th toe dorsal PIPJ. Ulceration measures 0.4 cm x 0.3cm x 0.2 cm. There is a mildly keratotic border with a graular base with a small  amount of calcific tissue concerning for possible superimposed gout. The ulceration does not probe to bone. There is no malodor, no active drainage, no erythema, no edema. No acute signs of infection.   To Dorsum of right foot there is mild erythema and edema; no warmth; no open lesion or other signs of infection.  Nails x 10 are short mildly dystrophic with no signs of infection.   Vascular: Dorsalis Pedis pulse = 2/4 Bilateral,  Posterior Tibial pulse = 1/4 Bilateral,  Capillary Fill Time < 3 seconds  Neurologic: Epicritic sensation intact to the level of ankle using the 5.07/10g Semmes Weinstein Monofilament. Vibratory intact bilateral. Subjective occassional numbness to all toes.  Musculosketal: Asymptomatic bunion and hammertoe deformity. No Pain with palpation to ulcerated area at left 4th toe. No pain with compression to calves bilateral.   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 - Excisionally dedbrided ulceration at Left 4th toe to healthy bleeding borders using a sterile tissue nipper -Applied triple antibiotic and dry sterile dressing and instructed patient to continue with daily dressings at home consisting of neosporin and bandaid/dry  sterile dressing after epsom salt soaks. - Advised patient to go to the ER or return to office if the wound worsens or if constitutional symptoms are present. -After oral consent and aseptic prep, injected a mixture containing 1 ml of 2% plain lidocaine, 1 ml 0.5% plain marcaine, 0.5 ml of kenalog 10 and 0.5 ml of dexamethasone phosphate Right dorsal forefoot. Post-injection care discussed with patient. To ice affected area until symptoms resolve. -Discussed with patient that if symptoms are still present may consider gout work up secondary to CKD stage III at next encounter -Patient to return to office in 2 weeks for follow up care and evaluation or sooner if problems arise.  Landis Martins, DPM

## 2015-06-24 ENCOUNTER — Ambulatory Visit: Payer: Commercial Managed Care - HMO | Admitting: Sports Medicine

## 2015-06-26 DIAGNOSIS — I482 Chronic atrial fibrillation: Secondary | ICD-10-CM | POA: Diagnosis not present

## 2015-06-26 DIAGNOSIS — E119 Type 2 diabetes mellitus without complications: Secondary | ICD-10-CM | POA: Diagnosis not present

## 2015-06-26 DIAGNOSIS — I10 Essential (primary) hypertension: Secondary | ICD-10-CM | POA: Diagnosis not present

## 2015-07-01 ENCOUNTER — Ambulatory Visit: Payer: Commercial Managed Care - HMO | Admitting: Sports Medicine

## 2015-07-08 ENCOUNTER — Ambulatory Visit: Payer: Commercial Managed Care - HMO | Admitting: Sports Medicine

## 2015-07-08 DIAGNOSIS — E119 Type 2 diabetes mellitus without complications: Secondary | ICD-10-CM | POA: Diagnosis not present

## 2015-07-08 DIAGNOSIS — E559 Vitamin D deficiency, unspecified: Secondary | ICD-10-CM | POA: Diagnosis not present

## 2015-07-08 DIAGNOSIS — I10 Essential (primary) hypertension: Secondary | ICD-10-CM | POA: Diagnosis not present

## 2015-07-08 DIAGNOSIS — E782 Mixed hyperlipidemia: Secondary | ICD-10-CM | POA: Diagnosis not present

## 2015-07-08 DIAGNOSIS — N182 Chronic kidney disease, stage 2 (mild): Secondary | ICD-10-CM | POA: Diagnosis not present

## 2015-07-17 DIAGNOSIS — E1122 Type 2 diabetes mellitus with diabetic chronic kidney disease: Secondary | ICD-10-CM | POA: Diagnosis not present

## 2015-07-17 DIAGNOSIS — E559 Vitamin D deficiency, unspecified: Secondary | ICD-10-CM | POA: Diagnosis not present

## 2015-07-17 DIAGNOSIS — I482 Chronic atrial fibrillation: Secondary | ICD-10-CM | POA: Diagnosis not present

## 2015-07-17 DIAGNOSIS — N184 Chronic kidney disease, stage 4 (severe): Secondary | ICD-10-CM | POA: Diagnosis not present

## 2015-07-17 DIAGNOSIS — E782 Mixed hyperlipidemia: Secondary | ICD-10-CM | POA: Diagnosis not present

## 2015-07-17 DIAGNOSIS — I10 Essential (primary) hypertension: Secondary | ICD-10-CM | POA: Diagnosis not present

## 2015-08-21 DIAGNOSIS — I482 Chronic atrial fibrillation: Secondary | ICD-10-CM | POA: Diagnosis not present

## 2015-08-22 ENCOUNTER — Ambulatory Visit: Payer: Commercial Managed Care - HMO | Admitting: Orthopedic Surgery

## 2015-08-26 ENCOUNTER — Ambulatory Visit: Payer: Commercial Managed Care - HMO | Admitting: Orthopedic Surgery

## 2015-08-26 ENCOUNTER — Encounter: Payer: Self-pay | Admitting: Orthopedic Surgery

## 2015-09-12 DIAGNOSIS — I482 Chronic atrial fibrillation: Secondary | ICD-10-CM | POA: Diagnosis not present

## 2015-10-16 ENCOUNTER — Other Ambulatory Visit (HOSPITAL_COMMUNITY): Payer: Self-pay | Admitting: Adult Health Nurse Practitioner

## 2015-10-16 ENCOUNTER — Ambulatory Visit (HOSPITAL_COMMUNITY)
Admission: RE | Admit: 2015-10-16 | Discharge: 2015-10-16 | Disposition: A | Discharge status: Home / Self Care | Payer: Commercial Managed Care - HMO | Source: Ambulatory Visit | Attending: Adult Health Nurse Practitioner | Admitting: Adult Health Nurse Practitioner

## 2015-10-16 DIAGNOSIS — M79671 Pain in right foot: Secondary | ICD-10-CM | POA: Diagnosis not present

## 2015-10-16 DIAGNOSIS — M7731 Calcaneal spur, right foot: Secondary | ICD-10-CM | POA: Insufficient documentation

## 2015-10-16 DIAGNOSIS — I482 Chronic atrial fibrillation: Secondary | ICD-10-CM | POA: Diagnosis not present

## 2015-10-16 DIAGNOSIS — M816 Localized osteoporosis [Lequesne]: Secondary | ICD-10-CM | POA: Insufficient documentation

## 2015-10-16 DIAGNOSIS — M2011 Hallux valgus (acquired), right foot: Secondary | ICD-10-CM | POA: Diagnosis not present

## 2015-10-16 DIAGNOSIS — W19XXXA Unspecified fall, initial encounter: Secondary | ICD-10-CM

## 2015-10-16 DIAGNOSIS — L03115 Cellulitis of right lower limb: Secondary | ICD-10-CM | POA: Diagnosis not present

## 2015-10-16 DIAGNOSIS — M25571 Pain in right ankle and joints of right foot: Secondary | ICD-10-CM | POA: Diagnosis not present

## 2015-10-18 DIAGNOSIS — D631 Anemia in chronic kidney disease: Secondary | ICD-10-CM | POA: Diagnosis not present

## 2015-10-18 DIAGNOSIS — N184 Chronic kidney disease, stage 4 (severe): Secondary | ICD-10-CM | POA: Diagnosis not present

## 2015-10-18 DIAGNOSIS — E782 Mixed hyperlipidemia: Secondary | ICD-10-CM | POA: Diagnosis not present

## 2015-10-18 DIAGNOSIS — E1129 Type 2 diabetes mellitus with other diabetic kidney complication: Secondary | ICD-10-CM | POA: Diagnosis not present

## 2015-10-18 DIAGNOSIS — N2581 Secondary hyperparathyroidism of renal origin: Secondary | ICD-10-CM | POA: Diagnosis not present

## 2015-10-18 DIAGNOSIS — I129 Hypertensive chronic kidney disease with stage 1 through stage 4 chronic kidney disease, or unspecified chronic kidney disease: Secondary | ICD-10-CM | POA: Diagnosis not present

## 2015-10-18 DIAGNOSIS — I480 Paroxysmal atrial fibrillation: Secondary | ICD-10-CM | POA: Diagnosis not present

## 2015-10-18 DIAGNOSIS — Z950 Presence of cardiac pacemaker: Secondary | ICD-10-CM | POA: Diagnosis not present

## 2015-10-23 DIAGNOSIS — C44729 Squamous cell carcinoma of skin of left lower limb, including hip: Secondary | ICD-10-CM | POA: Diagnosis not present

## 2015-10-23 DIAGNOSIS — L570 Actinic keratosis: Secondary | ICD-10-CM | POA: Diagnosis not present

## 2015-10-23 DIAGNOSIS — B078 Other viral warts: Secondary | ICD-10-CM | POA: Diagnosis not present

## 2015-10-23 DIAGNOSIS — D225 Melanocytic nevi of trunk: Secondary | ICD-10-CM | POA: Diagnosis not present

## 2015-10-23 DIAGNOSIS — X32XXXA Exposure to sunlight, initial encounter: Secondary | ICD-10-CM | POA: Diagnosis not present

## 2015-11-01 DIAGNOSIS — I129 Hypertensive chronic kidney disease with stage 1 through stage 4 chronic kidney disease, or unspecified chronic kidney disease: Secondary | ICD-10-CM | POA: Diagnosis not present

## 2015-11-01 DIAGNOSIS — N184 Chronic kidney disease, stage 4 (severe): Secondary | ICD-10-CM | POA: Diagnosis not present

## 2015-12-03 DIAGNOSIS — X32XXXD Exposure to sunlight, subsequent encounter: Secondary | ICD-10-CM | POA: Diagnosis not present

## 2015-12-03 DIAGNOSIS — C44519 Basal cell carcinoma of skin of other part of trunk: Secondary | ICD-10-CM | POA: Diagnosis not present

## 2015-12-03 DIAGNOSIS — Z08 Encounter for follow-up examination after completed treatment for malignant neoplasm: Secondary | ICD-10-CM | POA: Diagnosis not present

## 2015-12-03 DIAGNOSIS — L570 Actinic keratosis: Secondary | ICD-10-CM | POA: Diagnosis not present

## 2015-12-03 DIAGNOSIS — L820 Inflamed seborrheic keratosis: Secondary | ICD-10-CM | POA: Diagnosis not present

## 2015-12-03 DIAGNOSIS — Z85828 Personal history of other malignant neoplasm of skin: Secondary | ICD-10-CM | POA: Diagnosis not present

## 2015-12-09 DIAGNOSIS — M79675 Pain in left toe(s): Secondary | ICD-10-CM | POA: Diagnosis not present

## 2015-12-09 DIAGNOSIS — M119 Crystal arthropathy, unspecified: Secondary | ICD-10-CM | POA: Diagnosis not present

## 2015-12-09 DIAGNOSIS — M2042 Other hammer toe(s) (acquired), left foot: Secondary | ICD-10-CM | POA: Diagnosis not present

## 2015-12-09 DIAGNOSIS — D485 Neoplasm of uncertain behavior of skin: Secondary | ICD-10-CM | POA: Diagnosis not present

## 2015-12-09 DIAGNOSIS — R896 Abnormal cytological findings in specimens from other organs, systems and tissues: Secondary | ICD-10-CM | POA: Diagnosis not present

## 2015-12-09 DIAGNOSIS — L929 Granulomatous disorder of the skin and subcutaneous tissue, unspecified: Secondary | ICD-10-CM | POA: Diagnosis not present

## 2015-12-16 ENCOUNTER — Ambulatory Visit (INDEPENDENT_AMBULATORY_CARE_PROVIDER_SITE_OTHER): Payer: Commercial Managed Care - HMO | Admitting: Orthopedic Surgery

## 2015-12-16 ENCOUNTER — Encounter: Payer: Self-pay | Admitting: Orthopedic Surgery

## 2015-12-16 VITALS — BP 128/62 | Ht 66.0 in | Wt 196.0 lb

## 2015-12-16 DIAGNOSIS — M129 Arthropathy, unspecified: Secondary | ICD-10-CM

## 2015-12-16 DIAGNOSIS — M1990 Unspecified osteoarthritis, unspecified site: Secondary | ICD-10-CM

## 2015-12-16 DIAGNOSIS — M1812 Unilateral primary osteoarthritis of first carpometacarpal joint, left hand: Secondary | ICD-10-CM | POA: Diagnosis not present

## 2015-12-16 DIAGNOSIS — M25562 Pain in left knee: Secondary | ICD-10-CM

## 2015-12-16 DIAGNOSIS — M19041 Primary osteoarthritis, right hand: Secondary | ICD-10-CM

## 2015-12-16 DIAGNOSIS — R2231 Localized swelling, mass and lump, right upper limb: Secondary | ICD-10-CM | POA: Diagnosis not present

## 2015-12-16 DIAGNOSIS — M1712 Unilateral primary osteoarthritis, left knee: Secondary | ICD-10-CM

## 2015-12-16 NOTE — Patient Instructions (Signed)
Apply Voltaren gel to the right long finger right thumb and right foot  You have received an injection of steroids into the joint. 15% of patients will have increased pain within the 24 hours postinjection.   This is transient and will go away.   We recommend that you use ice packs on the injection site for 20 minutes every 2 hours and extra strength Tylenol 2 tablets every 8 as needed until the pain resolves.  If you continue to have pain after taking the Tylenol and using the ice please call the office for further instructions.

## 2015-12-16 NOTE — Progress Notes (Signed)
Chief Complaint  Patient presents with  . New Patient (Initial Visit)    Right foot and right hand   HPI 75 year old female presents for evaluation of multiple joint pains and aches. She jammed her right foot had x-rays are inconclusive that happened 5 weeks ago. Secondary to a fall. Complains of 3 out of pain over the midfoot and difficult shoe wear. The pain is constant throbbing aching associated with swelling and stiffness  She also has a small mass on the volar aspect of the right long finger she has swelling of the PIP joint right long finger chest pain in the left knee  She has pain over the right thumb at the [facility] joint  ROS   Ankle leg swelling, joint pain and back pain, denies weight loss shortness of breath or chest pain  Past Medical History:  Diagnosis Date  . A-fib ([place])   . Chronic kidney disease   . Colon polyps   . CVA (cerebral vascular accident) ([place])   . Depression   . DM (diabetes mellitus) ([place])   . GERD (gastroesophageal reflux disease)   . HTN (hypertension)   . Hypercholesterolemia   . Hypothyroidism   . Pancreatitis    biliary  . S/P colonoscopy March 2006   Dr. Olevia Perches: per e-chart report: diverticulosis, path adenomatous polyps  . S/P endoscopy March 2009   multiple 3-4 mmsessile gastric polyps, benign, singl antral erosions, negative H.pylori, reactive gastropathy ? NSAIDs  . 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 CHOLECYSTECTOMY    . COLONOSCOPY  03/10/2011   Procedure: COLONOSCOPY;  Surgeon: Daneil Dolin, MD;  Location: [facility]ITE;  Service: Endoscopy;  Laterality: N/A;  12:45  . PACEMAKER INSERTION  02/05/12   SJM Accent DR RF implanted by Dr [name] for symptomatic bradycardia  . PERMANENT PACEMAKER INSERTION N/A 02/05/2012   Procedure: PERMANENT PACEMAKER INSERTION;  Surgeon: Thompson Grayer, MD;  Location: [facility] CATH LAB;  Service: Cardiovascular;  Laterality:  N/A;  . SHOULDER SURGERY     left   Family History  Problem Relation Age of Onset  . Colon cancer Neg Hx   . Heart disease    . Arthritis    . Lung disease    . Diabetes    . Kidney disease     Social History  Substance Use Topics  . Smoking status: Never Smoker  . Smokeless tobacco: Not on file  . Alcohol use No    Current Outpatient Prescriptions:  .  atorvastatin (LIPITOR) 40 MG tablet, Take 40 mg by mouth daily.  , Disp: , Rfl:  .  calcitRIOL (ROCALTROL) 0.25 MCG capsule, Take 2 tablets by mouth every other day and take 1 tablet daily on the other days, Disp: , Rfl:  .  citalopram (CELEXA) 20 MG tablet, Take 20 mg by mouth daily.  , Disp: , Rfl:  .  cyclobenzaprine (FLEXERIL) 10 MG tablet, Take 10 mg by mouth every 8 (eight) hours as needed for muscle spasms. , Disp: , Rfl:  .  cyclobenzaprine (FLEXERIL) 5 MG tablet, Take 1 tablet (5 mg total) by mouth every 8 (eight) hours as needed for muscle spasms., Disp: 42 tablet, Rfl: 5 .  DHA-EPA-Flaxseed Oil-Vitamin E CAPS, Take 1 capsule by mouth 2 (two) times daily., Disp: , Rfl:  .  flecainide (TAMBOCOR) 100 MG tablet, Take 100 mg by mouth 2 (two) times daily.  ,  Disp: , Rfl:  .  fluticasone (FLONASE) 50 MCG/ACT nasal spray, Place 2 sprays into the nose daily as needed. For congestion, Disp: , Rfl:  .  furosemide (LASIX) 80 MG tablet, Take 80 mg by mouth daily., Disp: , Rfl:  .  gabapentin (NEURONTIN) 100 MG capsule, Take 1 capsule (100 mg total) by mouth 3 (three) times daily., Disp: 90 capsule, Rfl: 5 .  glipiZIDE (GLUCOTROL XL) 10 MG 24 hr tablet, Take 10 mg by mouth daily.  , Disp: , Rfl:  .  HYDROcodone-acetaminophen (VICODIN) 5-500 MG per tablet, Take 1 tablet by mouth every 6 (six) hours as needed. For pain, Disp: , Rfl:  .  levothyroxine (SYNTHROID, LEVOTHROID) 75 MCG tablet, Take 75 mcg by mouth daily.  , Disp: , Rfl:  .  vitamin B-12 (CYANOCOBALAMIN) 1000 MCG tablet, Take 5,000 mcg by mouth daily., Disp: , Rfl:  .   warfarin (COUMADIN) 5 MG tablet, Take as directed by the coumadin clinic, Disp: , Rfl:  .  benazepril (LOTENSIN) 40 MG tablet, Take 20 mg by mouth daily. , Disp: , Rfl:  .  diclofenac sodium (VOLTAREN) 1 % GEL, Apply 1 application topically 4 (four) times daily., Disp: , Rfl:  .  HYDROcodone-acetaminophen (NORCO/VICODIN) 5-325 MG tablet, Take 1 tablet by mouth every 6 (six) hours as needed for moderate pain. (Patient not taking: Reported on 12/16/2015), Disp: 56 tablet, Rfl: 0 .  omeprazole (PRILOSEC) 40 MG capsule, Take 40 mg by mouth daily.  , Disp: , Rfl:   Current Facility-Administered Medications:  .  triamcinolone acetonide (KENALOG) 10 MG/ML injection 10 mg, 10 mg, Other, Once, Titorya Stover, DPM  BP 128/62   Ht 5\' 6"  (1.676 m)   Wt 196 lb (88.9 kg)   BMI 31.64 kg/m   Physical Exam  Constitutional: She is oriented to person, place, and time. She appears well-developed and well-nourished. No distress.  Cardiovascular: Normal rate and intact distal pulses.   Neurological: She is alert and oriented to person, place, and time. She has normal reflexes. She exhibits normal muscle tone. Coordination normal.  Skin: Skin is warm and dry. No rash noted. She is not diaphoretic. No erythema. No pallor.  Psychiatric: She has a normal mood and affect. Her behavior is normal. Judgment and thought content normal.    Ortho Exam  Ambulatory without assistive device no noticeable limp  Left knee medial and lateral joint line tenderness primarily lateral joint line small effusion knee flexion 120. Knee stable strength normal skin intact pulses normal. Distal sensation intact.  Right hand tenderness over the [facility] joint volar mass nodule subcutaneous tissue does not affect finger flexion all finger small joints have normal range of motion she has a positive grind test over the base of the right thumb. She has no tenderness in the de Quervain's first extensor compartment. All small joints wrist joint  thumb joint stable full range of motion no instability normal grip strength normal skin normal radial pulse normal sensation   ASSESSMENT: My personal interpretation of the images:  Great toe bunion mild arthritis MTP joint  Mild arthritis midfoot. Calcaneal spur. Achilles calcification.  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  After cleaning the skin with alcohol a 20-gauge needle was used to inject the left knee joint. There were no complications. A sterile bandage was applied.  Use Voltaren gel for the other arthritic joints and the patient would like to postpone any treatment for the right long finger cyst  Arther Abbott, MD 12/16/2015 4:07 PM

## 2015-12-18 DIAGNOSIS — M71371 Other bursal cyst, right ankle and foot: Secondary | ICD-10-CM | POA: Diagnosis not present

## 2015-12-18 DIAGNOSIS — Z7901 Long term (current) use of anticoagulants: Secondary | ICD-10-CM | POA: Diagnosis not present

## 2015-12-18 DIAGNOSIS — L03032 Cellulitis of left toe: Secondary | ICD-10-CM | POA: Diagnosis not present

## 2015-12-18 DIAGNOSIS — I10 Essential (primary) hypertension: Secondary | ICD-10-CM | POA: Diagnosis not present

## 2015-12-18 DIAGNOSIS — I482 Chronic atrial fibrillation: Secondary | ICD-10-CM | POA: Diagnosis not present

## 2016-01-01 DIAGNOSIS — M71371 Other bursal cyst, right ankle and foot: Secondary | ICD-10-CM | POA: Diagnosis not present

## 2016-01-01 DIAGNOSIS — M25571 Pain in right ankle and joints of right foot: Secondary | ICD-10-CM | POA: Diagnosis not present

## 2016-01-01 DIAGNOSIS — M19071 Primary osteoarthritis, right ankle and foot: Secondary | ICD-10-CM | POA: Diagnosis not present

## 2016-01-15 DIAGNOSIS — Z7901 Long term (current) use of anticoagulants: Secondary | ICD-10-CM | POA: Diagnosis not present

## 2016-01-15 DIAGNOSIS — E876 Hypokalemia: Secondary | ICD-10-CM | POA: Diagnosis not present

## 2016-01-15 DIAGNOSIS — E782 Mixed hyperlipidemia: Secondary | ICD-10-CM | POA: Diagnosis not present

## 2016-01-15 DIAGNOSIS — E1122 Type 2 diabetes mellitus with diabetic chronic kidney disease: Secondary | ICD-10-CM | POA: Diagnosis not present

## 2016-01-15 DIAGNOSIS — N184 Chronic kidney disease, stage 4 (severe): Secondary | ICD-10-CM | POA: Diagnosis not present

## 2016-01-15 DIAGNOSIS — I10 Essential (primary) hypertension: Secondary | ICD-10-CM | POA: Diagnosis not present

## 2016-01-15 DIAGNOSIS — I482 Chronic atrial fibrillation: Secondary | ICD-10-CM | POA: Diagnosis not present

## 2016-01-15 DIAGNOSIS — E559 Vitamin D deficiency, unspecified: Secondary | ICD-10-CM | POA: Diagnosis not present

## 2016-01-17 DIAGNOSIS — M65871 Other synovitis and tenosynovitis, right ankle and foot: Secondary | ICD-10-CM | POA: Diagnosis not present

## 2016-01-17 DIAGNOSIS — M25571 Pain in right ankle and joints of right foot: Secondary | ICD-10-CM | POA: Diagnosis not present

## 2016-01-20 DIAGNOSIS — E559 Vitamin D deficiency, unspecified: Secondary | ICD-10-CM | POA: Diagnosis not present

## 2016-01-20 DIAGNOSIS — E1122 Type 2 diabetes mellitus with diabetic chronic kidney disease: Secondary | ICD-10-CM | POA: Diagnosis not present

## 2016-01-20 DIAGNOSIS — E782 Mixed hyperlipidemia: Secondary | ICD-10-CM | POA: Diagnosis not present

## 2016-01-22 ENCOUNTER — Encounter: Payer: Self-pay | Admitting: Internal Medicine

## 2016-01-22 DIAGNOSIS — H5210 Myopia, unspecified eye: Secondary | ICD-10-CM | POA: Diagnosis not present

## 2016-01-24 DIAGNOSIS — I482 Chronic atrial fibrillation: Secondary | ICD-10-CM | POA: Diagnosis not present

## 2016-01-24 DIAGNOSIS — Z7901 Long term (current) use of anticoagulants: Secondary | ICD-10-CM | POA: Diagnosis not present

## 2016-02-03 DIAGNOSIS — Z7901 Long term (current) use of anticoagulants: Secondary | ICD-10-CM | POA: Diagnosis not present

## 2016-02-03 DIAGNOSIS — I10 Essential (primary) hypertension: Secondary | ICD-10-CM | POA: Diagnosis not present

## 2016-02-03 DIAGNOSIS — E1122 Type 2 diabetes mellitus with diabetic chronic kidney disease: Secondary | ICD-10-CM | POA: Diagnosis not present

## 2016-02-03 DIAGNOSIS — I482 Chronic atrial fibrillation: Secondary | ICD-10-CM | POA: Diagnosis not present

## 2016-02-26 DIAGNOSIS — E782 Mixed hyperlipidemia: Secondary | ICD-10-CM | POA: Diagnosis not present

## 2016-02-26 DIAGNOSIS — Z7901 Long term (current) use of anticoagulants: Secondary | ICD-10-CM | POA: Diagnosis not present

## 2016-02-26 DIAGNOSIS — I10 Essential (primary) hypertension: Secondary | ICD-10-CM | POA: Diagnosis not present

## 2016-02-26 DIAGNOSIS — I482 Chronic atrial fibrillation: Secondary | ICD-10-CM | POA: Diagnosis not present

## 2016-02-26 DIAGNOSIS — Z23 Encounter for immunization: Secondary | ICD-10-CM | POA: Diagnosis not present

## 2016-03-06 ENCOUNTER — Other Ambulatory Visit: Payer: Self-pay | Admitting: Pharmacist

## 2016-03-06 NOTE — Patient Outreach (Signed)
Outreach call to [employer] regarding her request for follow up from the [facility] Medication Adherence Campaign. Called and spoke with patient. HIPAA identifiers verified and verbal consent received.  [name] reports that her kidney doctor at [facility] has discontinued her benazepril and increased her furosemide dose to 80 mg twice daily. Reports that she takes her medications as directed. Denies any barriers to medication adherence such as cost or side effects.   Patient reports that she did not receive an updated prescription for furosemide from [facility] reflecting the twice daily dosing of this medication. Counsel patient on the importance of receiving this updated prescription to avoid a too soon to refill rejection from her insurance. Patient reports that she will call [facility] to request that this update be sent to her pharmacy.  Patient also asks about help with her MyChart account. Reports that she no longer remembers her username or password and has changed her email address. Provide patient with the MyChart help desk phone number from the [facility] website.  [name] reports that she has no further questions for me at this time. Provide patient with my phone number.  Harlow Asa, PharmD Clinical Pharmacist [facility] [phone]

## 2016-03-13 DIAGNOSIS — S0502XA Injury of conjunctiva and corneal abrasion without foreign body, left eye, initial encounter: Secondary | ICD-10-CM | POA: Diagnosis not present

## 2016-03-16 ENCOUNTER — Encounter: Payer: Self-pay | Admitting: Internal Medicine

## 2016-03-18 ENCOUNTER — Encounter: Payer: Commercial Managed Care - HMO | Admitting: Internal Medicine

## 2016-03-19 ENCOUNTER — Encounter: Payer: Self-pay | Admitting: Internal Medicine

## 2016-03-19 ENCOUNTER — Ambulatory Visit (INDEPENDENT_AMBULATORY_CARE_PROVIDER_SITE_OTHER): Payer: Commercial Managed Care - HMO | Admitting: Internal Medicine

## 2016-03-19 VITALS — BP 126/80 | HR 70 | Ht 67.0 in | Wt 200.0 lb

## 2016-03-19 DIAGNOSIS — I10 Essential (primary) hypertension: Secondary | ICD-10-CM | POA: Diagnosis not present

## 2016-03-19 DIAGNOSIS — Z950 Presence of cardiac pacemaker: Secondary | ICD-10-CM

## 2016-03-19 DIAGNOSIS — R001 Bradycardia, unspecified: Secondary | ICD-10-CM | POA: Diagnosis not present

## 2016-03-19 DIAGNOSIS — I495 Sick sinus syndrome: Secondary | ICD-10-CM

## 2016-03-19 DIAGNOSIS — I480 Paroxysmal atrial fibrillation: Secondary | ICD-10-CM | POA: Diagnosis not present

## 2016-03-19 LAB — CUP PACEART INCLINIC DEVICE CHECK
Brady Statistic RV Percent Paced: 3.1 %
Date Time Interrogation Session: 20171026152851
Implantable Lead Location: 753859
Lead Channel Pacing Threshold Amplitude: 0.75 V
Lead Channel Pacing Threshold Amplitude: 1.25 V
Lead Channel Pacing Threshold Pulse Width: 0.5 ms
Lead Channel Pacing Threshold Pulse Width: 0.5 ms
Lead Channel Sensing Intrinsic Amplitude: 11.1 mV
Lead Channel Setting Pacing Amplitude: 2 V
Lead Channel Setting Sensing Sensitivity: 2 mV
MDC IDC LEAD IMPLANT DT: 20130913
MDC IDC LEAD IMPLANT DT: 20130913
MDC IDC LEAD LOCATION: 753860
MDC IDC LEAD MODEL: 1948
MDC IDC MSMT BATTERY REMAINING LONGEVITY: 104.4
MDC IDC MSMT BATTERY VOLTAGE: 2.93 V
MDC IDC MSMT LEADCHNL RA IMPEDANCE VALUE: 537.5 Ohm
MDC IDC MSMT LEADCHNL RA SENSING INTR AMPL: 1.9 mV
MDC IDC MSMT LEADCHNL RV IMPEDANCE VALUE: 587.5 Ohm
MDC IDC SET LEADCHNL RV PACING AMPLITUDE: 2.5 V
MDC IDC SET LEADCHNL RV PACING PULSEWIDTH: 0.5 ms
MDC IDC STAT BRADY RA PERCENT PACED: 71 %
Pulse Gen Serial Number: 7382865

## 2016-03-19 NOTE — Patient Instructions (Addendum)
Medication Instructions:  Your physician recommends that you continue on your current medications as directed. Please refer to the Current Medication list given to you today.   Labwork: None ordered   Testing/Procedures: None ordered   Follow-Up: Your physician wants you to follow-up in: 12 months with Dr Allred You will receive a reminder letter in the mail two months in advance. If you don't receive a letter, please call our office to schedule the follow-up appointment.  Remote monitoring is used to monitor your Pacemaker  from home. This monitoring reduces the number of office visits required to check your device to one time per year. It allows us to keep an eye on the functioning of your device to ensure it is working properly. You are scheduled for a device check from home on 06/18/16. You may send your transmission at any time that day. If you have a wireless device, the transmission will be sent automatically. After your physician reviews your transmission, you will receive a postcard with your next transmission date.     Any Other Special Instructions Will Be Listed Below (If Applicable).     If you need a refill on your cardiac medications before your next appointment, please call your pharmacy.   

## 2016-03-19 NOTE — Progress Notes (Signed)
PCP: [name], MD Primary Cardiologist: previously Dr Lattie Haw  (lives in [place])  [name] is a 75 y.o. female who presents today for routine electrophysiology followup.  Since her last office visit, the patient reports doing very well.  She is unaware of any afib since her last visit.  Remains active. Today, she denies symptoms of exertional chest pain,  presyncope, or syncope.  The patient is otherwise without complaint today.   Past Medical History:  Diagnosis Date  . A-fib ([place])   . Chronic kidney disease   . Colon polyps   . CVA (cerebral vascular accident) ([place])   . Depression   . DM (diabetes mellitus) ([place])   . GERD (gastroesophageal reflux disease)   . HTN (hypertension)   . Hypercholesterolemia   . Hypothyroidism   . Pancreatitis    biliary  . S/P colonoscopy March 2006   Dr. Olevia Perches: per e-chart report: diverticulosis, path adenomatous polyps  . S/P endoscopy March 2009   multiple 3-4 mmsessile gastric polyps, benign, singl antral erosions, negative H.pylori, reactive gastropathy ? NSAIDs  . 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 CHOLECYSTECTOMY    . COLONOSCOPY  03/10/2011   Procedure: COLONOSCOPY;  Surgeon: Daneil Dolin, MD;  [facility];  Service: Endoscopy;  Laterality: N/A;  12:45  . PACEMAKER INSERTION  02/05/12   SJM Accent DR RF implanted by Dr [name] for symptomatic bradycardia  . PERMANENT PACEMAKER INSERTION N/A 02/05/2012   Procedure: PERMANENT PACEMAKER INSERTION;  Surgeon: Thompson Grayer, MD;  Location: [facility] CATH LAB;  Service: Cardiovascular;  Laterality: N/A;  . SHOULDER SURGERY     left    Current Outpatient Prescriptions  Medication Sig Dispense Refill  . atorvastatin (LIPITOR) 40 MG tablet Take 40 mg by mouth daily.      . calcitRIOL (ROCALTROL) 0.25 MCG capsule Take 2 tablets by mouth every other day and take 1 tablet daily on the other  days    . citalopram (CELEXA) 20 MG tablet Take 20 mg by mouth daily.      . DHA-EPA-Flaxseed Oil-Vitamin E CAPS Take 1 capsule by mouth 2 (two) times daily.    . flecainide (TAMBOCOR) 100 MG tablet Take 100 mg by mouth 2 (two) times daily.      . fluticasone (FLONASE) 50 MCG/ACT nasal spray Place 2 sprays into the nose daily as needed. For congestion    . furosemide (LASIX) 80 MG tablet Take 80 mg by mouth 2 (two) times daily.     Marland Kitchen gabapentin (NEURONTIN) 100 MG capsule Take 1 capsule (100 mg total) by mouth 3 (three) times daily. 90 capsule 5  . glipiZIDE (GLUCOTROL XL) 10 MG 24 hr tablet Take 10 mg by mouth daily.      Marland Kitchen HYDROcodone-acetaminophen (NORCO/VICODIN) 5-325 MG tablet Take 1 tablet by mouth every 6 (six) hours as needed for moderate pain. 56 tablet 0  . levothyroxine (SYNTHROID, LEVOTHROID) 75 MCG tablet Take 75 mcg by mouth daily.      Marland Kitchen omeprazole (PRILOSEC) 40 MG capsule Take 40 mg by mouth daily.      . potassium chloride SA (K-DUR,KLOR-CON) 20 MEQ tablet Take 1 tablet by mouth 2 (two) times daily.    . vitamin B-12 (CYANOCOBALAMIN) 1000 MCG tablet Take 5,000 mcg by mouth daily.    Marland Kitchen warfarin (COUMADIN) 5 MG tablet Take as directed by the coumadin clinic  Current Facility-Administered Medications  Medication Dose Route Frequency Provider Last Rate Last Dose  . triamcinolone acetonide (KENALOG) 10 MG/ML injection 10 mg  10 mg Other Once Landis Martins, DPM        Physical Exam: Vitals:   03/19/16 1450  BP: 126/80  Pulse: 70  Weight: 200 lb (90.7 kg)  Height: 5\' 7"  (1.702 m)    GEN- The patient is well appearing, alert and oriented x 3 today.   Head- normocephalic, atraumatic Eyes-  Sclera clear, conjunctiva pink Ears- hearing intact Oropharynx- clear Lungs- Clear to ausculation bilaterally, normal work of breathing Chest- pacemaker pocket is well healed Heart- Regular rate and rhythm, no murmurs, rubs or gallops, PMI not laterally displaced GI- soft, NT, ND, +  BS Extremities- no clubbing, cyanosis, or edema  Pacemaker interrogation- reviewed in detail today,  See PACEART report ekg today reveals sinus rhythm 70 bpm, PR 232 msec, septal infarct  Assessment and Plan:  1. Symptomatic sinus node dysfunction Normal pacemaker function See Pace Art report No changes today I have discussed the available STJ firmware patch for cyberhacking with the patient today.  We have discussed the risks associated with the firmware upgrade as well as the theoretical risk for cyberhacking.  After an informed discussion, the patient has decided not to install the firmware patch at this time.   2. afib Continue coumadin. She is not intersted in NOAC therapy at this time. Maintaining sinus rhythm with flecainide.  (Only a single 10 minute episode of AF in June)  3. CRI Stable, followed by nephrology  [place] remote monitoring Return in 1 year She lives in [place] but would prefer to travel to [place] for follow-up.  Thompson Grayer MD, [facility] 03/19/2016 3:16 PM

## 2016-03-23 DIAGNOSIS — E1129 Type 2 diabetes mellitus with other diabetic kidney complication: Secondary | ICD-10-CM | POA: Diagnosis not present

## 2016-03-23 DIAGNOSIS — N184 Chronic kidney disease, stage 4 (severe): Secondary | ICD-10-CM | POA: Diagnosis not present

## 2016-03-23 DIAGNOSIS — E782 Mixed hyperlipidemia: Secondary | ICD-10-CM | POA: Diagnosis not present

## 2016-03-23 DIAGNOSIS — N2581 Secondary hyperparathyroidism of renal origin: Secondary | ICD-10-CM | POA: Diagnosis not present

## 2016-03-23 DIAGNOSIS — D631 Anemia in chronic kidney disease: Secondary | ICD-10-CM | POA: Diagnosis not present

## 2016-03-23 DIAGNOSIS — I129 Hypertensive chronic kidney disease with stage 1 through stage 4 chronic kidney disease, or unspecified chronic kidney disease: Secondary | ICD-10-CM | POA: Diagnosis not present

## 2016-03-23 DIAGNOSIS — Z950 Presence of cardiac pacemaker: Secondary | ICD-10-CM | POA: Diagnosis not present

## 2016-03-23 DIAGNOSIS — I480 Paroxysmal atrial fibrillation: Secondary | ICD-10-CM | POA: Diagnosis not present

## 2016-03-25 DIAGNOSIS — Z7901 Long term (current) use of anticoagulants: Secondary | ICD-10-CM | POA: Diagnosis not present

## 2016-03-25 DIAGNOSIS — I482 Chronic atrial fibrillation: Secondary | ICD-10-CM | POA: Diagnosis not present

## 2016-03-25 DIAGNOSIS — Z6831 Body mass index (BMI) 31.0-31.9, adult: Secondary | ICD-10-CM | POA: Diagnosis not present

## 2016-04-22 DIAGNOSIS — N184 Chronic kidney disease, stage 4 (severe): Secondary | ICD-10-CM | POA: Diagnosis not present

## 2016-04-22 DIAGNOSIS — I10 Essential (primary) hypertension: Secondary | ICD-10-CM | POA: Diagnosis not present

## 2016-04-22 DIAGNOSIS — E1122 Type 2 diabetes mellitus with diabetic chronic kidney disease: Secondary | ICD-10-CM | POA: Diagnosis not present

## 2016-04-22 DIAGNOSIS — I482 Chronic atrial fibrillation: Secondary | ICD-10-CM | POA: Diagnosis not present

## 2016-04-22 DIAGNOSIS — Z6831 Body mass index (BMI) 31.0-31.9, adult: Secondary | ICD-10-CM | POA: Diagnosis not present

## 2016-04-22 DIAGNOSIS — Z7901 Long term (current) use of anticoagulants: Secondary | ICD-10-CM | POA: Diagnosis not present

## 2016-05-29 DIAGNOSIS — M109 Gout, unspecified: Secondary | ICD-10-CM | POA: Diagnosis not present

## 2016-05-29 DIAGNOSIS — Z6831 Body mass index (BMI) 31.0-31.9, adult: Secondary | ICD-10-CM | POA: Diagnosis not present

## 2016-06-15 DIAGNOSIS — L570 Actinic keratosis: Secondary | ICD-10-CM | POA: Diagnosis not present

## 2016-06-15 DIAGNOSIS — L03115 Cellulitis of right lower limb: Secondary | ICD-10-CM | POA: Diagnosis not present

## 2016-06-15 DIAGNOSIS — X32XXXD Exposure to sunlight, subsequent encounter: Secondary | ICD-10-CM | POA: Diagnosis not present

## 2016-06-18 ENCOUNTER — Ambulatory Visit (INDEPENDENT_AMBULATORY_CARE_PROVIDER_SITE_OTHER): Payer: Medicare HMO | Admitting: *Deleted

## 2016-06-18 DIAGNOSIS — I495 Sick sinus syndrome: Secondary | ICD-10-CM | POA: Diagnosis not present

## 2016-06-18 LAB — CUP PACEART REMOTE DEVICE CHECK
Battery Remaining Longevity: 97 mo
Battery Remaining Percentage: 81 %
Battery Voltage: 2.93 V
Brady Statistic AP VP Percent: 2.8 %
Brady Statistic AS VS Percent: 28 %
Brady Statistic RV Percent Paced: 2.8 %
Implantable Lead Implant Date: 20130913
Implantable Lead Implant Date: 20130913
Implantable Lead Location: 753859
Implantable Lead Model: 1948
Implantable Pulse Generator Implant Date: 20130913
Lead Channel Impedance Value: 530 Ohm
Lead Channel Impedance Value: 590 Ohm
Lead Channel Pacing Threshold Amplitude: 0.75 V
Lead Channel Pacing Threshold Pulse Width: 0.5 ms
Lead Channel Pacing Threshold Pulse Width: 0.5 ms
Lead Channel Sensing Intrinsic Amplitude: 2.9 mV
Lead Channel Setting Pacing Amplitude: 2 V
Lead Channel Setting Sensing Sensitivity: 2 mV
MDC IDC LEAD LOCATION: 753860
MDC IDC MSMT LEADCHNL RV PACING THRESHOLD AMPLITUDE: 1.25 V
MDC IDC MSMT LEADCHNL RV SENSING INTR AMPL: 10 mV
MDC IDC PG SERIAL: 7382865
MDC IDC SESS DTM: 20180125135753
MDC IDC SET LEADCHNL RV PACING AMPLITUDE: 2.5 V
MDC IDC SET LEADCHNL RV PACING PULSEWIDTH: 0.5 ms
MDC IDC STAT BRADY AP VS PERCENT: 69 %
MDC IDC STAT BRADY AS VP PERCENT: 1 %
MDC IDC STAT BRADY RA PERCENT PACED: 71 %

## 2016-06-18 NOTE — Progress Notes (Signed)
Remote pacemaker transmission.   

## 2016-06-19 ENCOUNTER — Encounter: Payer: Self-pay | Admitting: Cardiology

## 2016-06-24 DIAGNOSIS — I482 Chronic atrial fibrillation: Secondary | ICD-10-CM | POA: Diagnosis not present

## 2016-06-24 DIAGNOSIS — E1122 Type 2 diabetes mellitus with diabetic chronic kidney disease: Secondary | ICD-10-CM | POA: Diagnosis not present

## 2016-06-24 DIAGNOSIS — N184 Chronic kidney disease, stage 4 (severe): Secondary | ICD-10-CM | POA: Diagnosis not present

## 2016-06-24 DIAGNOSIS — I10 Essential (primary) hypertension: Secondary | ICD-10-CM | POA: Diagnosis not present

## 2016-06-26 DIAGNOSIS — N184 Chronic kidney disease, stage 4 (severe): Secondary | ICD-10-CM | POA: Diagnosis not present

## 2016-06-26 DIAGNOSIS — I482 Chronic atrial fibrillation: Secondary | ICD-10-CM | POA: Diagnosis not present

## 2016-06-26 DIAGNOSIS — I10 Essential (primary) hypertension: Secondary | ICD-10-CM | POA: Diagnosis not present

## 2016-06-26 DIAGNOSIS — E1122 Type 2 diabetes mellitus with diabetic chronic kidney disease: Secondary | ICD-10-CM | POA: Diagnosis not present

## 2016-07-03 ENCOUNTER — Encounter: Payer: Self-pay | Admitting: Cardiology

## 2016-07-22 DIAGNOSIS — E1122 Type 2 diabetes mellitus with diabetic chronic kidney disease: Secondary | ICD-10-CM | POA: Diagnosis not present

## 2016-07-22 DIAGNOSIS — I10 Essential (primary) hypertension: Secondary | ICD-10-CM | POA: Diagnosis not present

## 2016-07-22 DIAGNOSIS — I482 Chronic atrial fibrillation: Secondary | ICD-10-CM | POA: Diagnosis not present

## 2016-07-22 DIAGNOSIS — N184 Chronic kidney disease, stage 4 (severe): Secondary | ICD-10-CM | POA: Diagnosis not present

## 2016-07-22 DIAGNOSIS — E782 Mixed hyperlipidemia: Secondary | ICD-10-CM | POA: Diagnosis not present

## 2016-07-22 DIAGNOSIS — Z6831 Body mass index (BMI) 31.0-31.9, adult: Secondary | ICD-10-CM | POA: Diagnosis not present

## 2016-07-22 DIAGNOSIS — E559 Vitamin D deficiency, unspecified: Secondary | ICD-10-CM | POA: Diagnosis not present

## 2016-07-22 DIAGNOSIS — Z7901 Long term (current) use of anticoagulants: Secondary | ICD-10-CM | POA: Diagnosis not present

## 2016-07-23 DIAGNOSIS — I480 Paroxysmal atrial fibrillation: Secondary | ICD-10-CM | POA: Diagnosis not present

## 2016-07-23 DIAGNOSIS — N184 Chronic kidney disease, stage 4 (severe): Secondary | ICD-10-CM | POA: Diagnosis not present

## 2016-07-23 DIAGNOSIS — D631 Anemia in chronic kidney disease: Secondary | ICD-10-CM | POA: Diagnosis not present

## 2016-07-23 DIAGNOSIS — I129 Hypertensive chronic kidney disease with stage 1 through stage 4 chronic kidney disease, or unspecified chronic kidney disease: Secondary | ICD-10-CM | POA: Diagnosis not present

## 2016-07-23 DIAGNOSIS — E782 Mixed hyperlipidemia: Secondary | ICD-10-CM | POA: Diagnosis not present

## 2016-07-23 DIAGNOSIS — M109 Gout, unspecified: Secondary | ICD-10-CM | POA: Diagnosis not present

## 2016-07-23 DIAGNOSIS — N2581 Secondary hyperparathyroidism of renal origin: Secondary | ICD-10-CM | POA: Diagnosis not present

## 2016-07-23 DIAGNOSIS — Z950 Presence of cardiac pacemaker: Secondary | ICD-10-CM | POA: Diagnosis not present

## 2016-07-23 DIAGNOSIS — E1129 Type 2 diabetes mellitus with other diabetic kidney complication: Secondary | ICD-10-CM | POA: Diagnosis not present

## 2016-07-28 DIAGNOSIS — E1122 Type 2 diabetes mellitus with diabetic chronic kidney disease: Secondary | ICD-10-CM | POA: Diagnosis not present

## 2016-07-28 DIAGNOSIS — E039 Hypothyroidism, unspecified: Secondary | ICD-10-CM | POA: Diagnosis not present

## 2016-07-28 DIAGNOSIS — N184 Chronic kidney disease, stage 4 (severe): Secondary | ICD-10-CM | POA: Diagnosis not present

## 2016-07-28 DIAGNOSIS — I10 Essential (primary) hypertension: Secondary | ICD-10-CM | POA: Diagnosis not present

## 2016-07-28 DIAGNOSIS — Z7901 Long term (current) use of anticoagulants: Secondary | ICD-10-CM | POA: Diagnosis not present

## 2016-07-28 DIAGNOSIS — E782 Mixed hyperlipidemia: Secondary | ICD-10-CM | POA: Diagnosis not present

## 2016-07-28 DIAGNOSIS — I482 Chronic atrial fibrillation: Secondary | ICD-10-CM | POA: Diagnosis not present

## 2016-08-19 DIAGNOSIS — I10 Essential (primary) hypertension: Secondary | ICD-10-CM | POA: Diagnosis not present

## 2016-08-19 DIAGNOSIS — I482 Chronic atrial fibrillation: Secondary | ICD-10-CM | POA: Diagnosis not present

## 2016-08-24 DIAGNOSIS — N184 Chronic kidney disease, stage 4 (severe): Secondary | ICD-10-CM | POA: Diagnosis not present

## 2016-08-27 ENCOUNTER — Emergency Department (HOSPITAL_COMMUNITY): Payer: Medicare HMO

## 2016-08-27 ENCOUNTER — Encounter (HOSPITAL_COMMUNITY): Payer: Self-pay | Admitting: Emergency Medicine

## 2016-08-27 ENCOUNTER — Emergency Department (HOSPITAL_COMMUNITY)
Admission: EM | Admit: 2016-08-27 | Discharge: 2016-08-27 | Disposition: A | Discharge status: Home / Self Care | Payer: Medicare HMO | Attending: Emergency Medicine | Admitting: Emergency Medicine

## 2016-08-27 DIAGNOSIS — E039 Hypothyroidism, unspecified: Secondary | ICD-10-CM | POA: Insufficient documentation

## 2016-08-27 DIAGNOSIS — Y939 Activity, unspecified: Secondary | ICD-10-CM | POA: Diagnosis not present

## 2016-08-27 DIAGNOSIS — W01198A Fall on same level from slipping, tripping and stumbling with subsequent striking against other object, initial encounter: Secondary | ICD-10-CM | POA: Diagnosis not present

## 2016-08-27 DIAGNOSIS — S62347A Nondisplaced fracture of base of fifth metacarpal bone. left hand, initial encounter for closed fracture: Secondary | ICD-10-CM | POA: Insufficient documentation

## 2016-08-27 DIAGNOSIS — M79642 Pain in left hand: Secondary | ICD-10-CM | POA: Diagnosis not present

## 2016-08-27 DIAGNOSIS — N189 Chronic kidney disease, unspecified: Secondary | ICD-10-CM | POA: Diagnosis not present

## 2016-08-27 DIAGNOSIS — M79641 Pain in right hand: Secondary | ICD-10-CM | POA: Diagnosis not present

## 2016-08-27 DIAGNOSIS — S0990XA Unspecified injury of head, initial encounter: Secondary | ICD-10-CM | POA: Diagnosis not present

## 2016-08-27 DIAGNOSIS — Z7984 Long term (current) use of oral hypoglycemic drugs: Secondary | ICD-10-CM | POA: Diagnosis not present

## 2016-08-27 DIAGNOSIS — S069X9A Unspecified intracranial injury with loss of consciousness of unspecified duration, initial encounter: Secondary | ICD-10-CM | POA: Diagnosis not present

## 2016-08-27 DIAGNOSIS — E1122 Type 2 diabetes mellitus with diabetic chronic kidney disease: Secondary | ICD-10-CM | POA: Insufficient documentation

## 2016-08-27 DIAGNOSIS — Y999 Unspecified external cause status: Secondary | ICD-10-CM | POA: Diagnosis not present

## 2016-08-27 DIAGNOSIS — Y92009 Unspecified place in unspecified non-institutional (private) residence as the place of occurrence of the external cause: Secondary | ICD-10-CM | POA: Diagnosis not present

## 2016-08-27 DIAGNOSIS — I129 Hypertensive chronic kidney disease with stage 1 through stage 4 chronic kidney disease, or unspecified chronic kidney disease: Secondary | ICD-10-CM | POA: Insufficient documentation

## 2016-08-27 DIAGNOSIS — S6992XA Unspecified injury of left wrist, hand and finger(s), initial encounter: Secondary | ICD-10-CM | POA: Diagnosis present

## 2016-08-27 MED ORDER — HYDROCODONE-ACETAMINOPHEN 5-325 MG PO TABS
1.0000 | ORAL_TABLET | Freq: Once | ORAL | Status: AC
  Administered 2016-08-27: 1 via ORAL
  Filled 2016-08-27: qty 1

## 2016-08-27 NOTE — ED Triage Notes (Signed)
Patient c/o bilateral hand pain (right thumb and left palm. Per patient tripped and fell approx 20 minutes ago and tried to catch herself with hands. Patient does report hitting nose on concrete but denies hitting head or LOC.

## 2016-08-27 NOTE — ED Notes (Signed)
Pt states her nose hit the concrete and her head hit the grass

## 2016-08-27 NOTE — ED Notes (Signed)
Patient given discharge instruction, verbalized understand. Patient ambulatory out of the department.  

## 2016-08-29 NOTE — ED Provider Notes (Signed)
[place] DEPT Provider Note   CSN: [id number] Arrival date & time: 08/27/16  1515     History   Chief Complaint Chief Complaint  Patient presents with  . Fall    HPI [name] is a 76 y.o. female with past medical history as outlined below, most important for a fib on chronic coumadin therapy presenting with complaint of bilateral hand pain and head injury sustained when she tripped over a plant border at her home.  She fell forward on her outstretched hands but also hit her nose on the edge of the pavement and her forehead hit the grass.  Injury occurred within an hour of arrival.  She denies loc and denies epistaxis, reports minimal facial pain but has discovered a "knot" on her forehead since arrival.  she has had no dizziness, vision changes, n/v, focal weakness.  Has aching pain along her left lateral hand, right hand minimal pain.  There is no radiation of pain into her arms and she denies any neck, back, hip or lower extremity injury.  She has had no treatment prior to arrival.  Her last INR was obtained 6 days ago and was 2.7.  The history is provided by the patient.    Past Medical History:  Diagnosis Date  . A-fib ([place])   . Chronic kidney disease   . Colon polyps   . CVA (cerebral vascular accident) ([place])   . Depression   . DM (diabetes mellitus) ([place])   . GERD (gastroesophageal reflux disease)   . HTN (hypertension)   . Hypercholesterolemia   . Hypothyroidism   . Pancreatitis    biliary  . S/P colonoscopy March 2006   Dr. Olevia Perches: per e-chart report: diverticulosis, path adenomatous polyps  . S/P endoscopy March 2009   multiple 3-4 mmsessile gastric polyps, benign, singl antral erosions, negative H.pylori, reactive gastropathy ? NSAIDs  . 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 Pacemaker-St.Jude 02/09/2012  . Symptomatic bradycardia 02/04/2012  . Atrial fibrillation  ([place]) 02/04/2012  . Medial meniscus, posterior horn derangement 11/16/2011  . Osteoarthritis, knee 11/16/2011  . Loose, body, joint, knee 11/16/2011  . Osteoarthritis of left knee 11/04/2011  . Medial meniscus tear 11/04/2011  . Adenomatous polyps 02/19/2011  . Acute ill-defined cerebrovascular disease 04/25/2007  . A-fib ([place]) 04/25/2007  . Clinical depression 04/25/2007  . HLD (hyperlipidemia) 04/25/2007  . Diabetes ([place]) 04/25/2007  . 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 Essential (primary) hypertension 04/25/2007  . Adult hypothyroidism 04/25/2007    Past Surgical History:  Procedure Laterality Date  . CHOLECYSTECTOMY    . COLONOSCOPY  03/10/2011   Procedure: COLONOSCOPY;  Surgeon: Daneil Dolin, MD;  [facility];  Service: Endoscopy;  Laterality: N/A;  12:45  . PACEMAKER INSERTION  02/05/12   SJM Accent DR RF implanted by Dr [name] for symptomatic bradycardia  . PERMANENT PACEMAKER INSERTION N/A 02/05/2012   Procedure: PERMANENT PACEMAKER INSERTION;  Surgeon: Thompson Grayer, MD;  Location: [facility] CATH LAB;  Service: Cardiovascular;  Laterality: N/A;  . SHOULDER SURGERY     left    OB History    No data available       Home Medications    Prior to Admission medications   Medication Sig Start Date End Date Taking? Authorizing Provider  atorvastatin (LIPITOR) 40 MG tablet Take 40 mg by mouth daily.  Historical Provider, MD  calcitRIOL (ROCALTROL) 0.25 MCG capsule Take 2 tablets by mouth every other day and take 1 tablet daily on the other days    Historical Provider, MD  citalopram (CELEXA) 20 MG tablet Take 20 mg by mouth daily.      Historical Provider, MD  DHA-EPA-Flaxseed Oil-Vitamin E CAPS Take 1 capsule by mouth 2 (two) times daily.    Historical Provider, MD  flecainide (TAMBOCOR) 100 MG tablet Take 100 mg by mouth 2 (two) times daily.      Historical Provider, MD  fluticasone (FLONASE) 50 MCG/ACT nasal spray Place  2 sprays into the nose daily as needed. For congestion    Historical Provider, MD  furosemide (LASIX) 80 MG tablet Take 80 mg by mouth 2 (two) times daily.     Historical Provider, MD  gabapentin (NEURONTIN) 100 MG capsule Take 1 capsule (100 mg total) by mouth 3 (three) times daily. 03/25/15   Carole Civil, MD  glipiZIDE (GLUCOTROL XL) 10 MG 24 hr tablet Take 10 mg by mouth daily.      Historical Provider, MD  HYDROcodone-acetaminophen (NORCO/VICODIN) 5-325 MG tablet Take 1 tablet by mouth every 6 (six) hours as needed for moderate pain. 03/11/15   Carole Civil, MD  levothyroxine (SYNTHROID, LEVOTHROID) 75 MCG tablet Take 75 mcg by mouth daily.      Historical Provider, MD  omeprazole (PRILOSEC) 40 MG capsule Take 40 mg by mouth daily.      Historical Provider, MD  potassium chloride SA (K-DUR,KLOR-CON) 20 MEQ tablet Take 1 tablet by mouth 2 (two) times daily. 01/22/16   Historical Provider, MD  vitamin B-12 (CYANOCOBALAMIN) 1000 MCG tablet Take 5,000 mcg by mouth daily.    Historical Provider, MD  warfarin (COUMADIN) 5 MG tablet Take as directed by the coumadin clinic 02/06/12   [place], PA-C    Family History Family History  Problem Relation Age of Onset  . Heart disease    . Arthritis    . Lung disease    . Diabetes    . Kidney disease    . Colon cancer Neg Hx     Social History Social History  Substance Use Topics  . Smoking status: Never Smoker  . Smokeless tobacco: Never Used  . Alcohol use No     Allergies   Other; Penicillins; and Tetanus toxoids   Review of Systems Review of Systems  Constitutional: Negative.   HENT: Positive for facial swelling. Negative for nosebleeds.   Musculoskeletal: Positive for arthralgias and joint swelling. Negative for myalgias.  Skin: Negative for color change and wound.  Neurological: Negative for dizziness, weakness, numbness and headaches.     Physical Exam Updated Vital Signs BP (!) 182/82 (BP Location: Left  Arm)   Pulse 72   Temp 97.9 F (36.6 C) (Oral)   Resp 18   Ht 5\' 7"  (1.702 m)   Wt 90.7 kg   SpO2 100%   BMI 31.32 kg/m   Physical Exam  Constitutional: She is oriented to person, place, and time. She appears well-developed and well-nourished.  HENT:  Head: Normocephalic.  Mouth/Throat: Oropharynx is clear and moist.  Small raised hematoma right forehead, skin intact.  No palpable skull deformity.  Eyes: EOM are normal. Pupils are equal, round, and reactive to light.  Neck: Normal range of motion. Neck supple.  Cardiovascular: Normal rate and normal heart sounds.   Pulmonary/Chest: Effort normal.  Abdominal: Soft. There is no tenderness.  Musculoskeletal: Normal  range of motion. She exhibits tenderness.       Right hand: She exhibits tenderness. She exhibits normal range of motion, normal capillary refill and no deformity. Normal sensation noted. Normal strength noted.       Left hand: She exhibits bony tenderness and swelling. She exhibits normal capillary refill and no deformity. Normal sensation noted.       Hands: Lymphadenopathy:    She has no cervical adenopathy.  Neurological: She is alert and oriented to person, place, and time. She has normal strength. No sensory deficit. Gait normal. GCS eye subscore is 4. GCS verbal subscore is 5. GCS motor subscore is 6.   Cranial nerves III-XII intact.  No pronator drift.  Skin: Skin is warm and dry. No abrasion, no ecchymosis, no laceration and no rash noted.  Psychiatric: She has a normal mood and affect. Her speech is normal and behavior is normal. Thought content normal. Cognition and memory are normal.  Nursing note and vitals reviewed.    ED Treatments / Results  Labs (all labs ordered are listed, but only abnormal results are displayed) Labs Reviewed - No data to display  EKG  EKG Interpretation None       Radiology Ct Head Wo Contrast  Result Date: 08/27/2016 CLINICAL DATA:  [place] and struck head on concrete,  denies loss of consciousness, head injury, history hypertension, diabetes mellitus, stroke EXAM: CT HEAD WITHOUT CONTRAST TECHNIQUE: Contiguous axial images were obtained from the base of the skull through the vertex without intravenous contrast. Sagittal and coronal MPR images reconstructed from axial data set. COMPARISON:  04/23/2011 FINDINGS: Brain: Normal ventricular morphology. No midline shift or mass effect. Remote RIGHT MCA infarct appears unchanged. Small vessel chr2onic ischemic changes of deep cerebral white matter. No acute intracranial hemorrhage, mass lesion, or evidence acute infarction. No extra-axial fluid collections. Vascular: Atherosclerotic calcifications at the carotid siphons and LEFT vertebral artery Skull: Intact though demineralized Sinuses/Orbits: Clear Other: N/A IMPRESSION: Atrophy with small vessel chronic ischemic changes of deep cerebral white matter. Old RIGHT MCA infarct. No acute intracranial abnormalities. Electronically Signed   By: Lavonia Dana M.D.   On: 08/27/2016 18:00    Procedures Procedures (including critical care time)  SPLINT APPLICATION Date/Time: 8144 Authorized by: Evalee Jefferson Consent: Verbal consent obtained. Risks and benefits: risks, benefits and alternatives were discussed Consent given by: patient Splint applied by:RN Location details: left hand Splint type: ulnar gutter Supplies used: fiberglass, webril, ace Post-procedure: The splinted body part was neurovascularly unchanged following the procedure. Patient tolerance: Patient tolerated the procedure well with no immediate complications.     Medications Ordered in ED Medications  HYDROcodone-acetaminophen (NORCO/VICODIN) 5-325 MG per tablet 1 tablet (1 tablet Oral Given 08/27/16 1734)     Initial Impression / Assessment and Plan / ED Course  I have reviewed the triage vital signs and the nursing notes.  Pertinent labs & imaging results that were available during my care of the  patient were reviewed by me and considered in my medical decision making (see chart for details).     Pt with nondisplaced left metacarpal fx.  Ulnar gutter applied, sling.  RICE.  Referral to ortho for ongoing care of this injury.  Minor head injury with stable CT.  Instructions given for f/u care for any new sx.  Pt reports stable INR's and has not had to change her dosing in over 6 months, last check less than 1 week ago, no need to repeat this today.  Final Clinical Impressions(s) / ED Diagnoses   Final diagnoses:  Closed nondisplaced fracture of base of fifth metacarpal bone of left hand, initial encounter  Minor head injury, initial encounter    New Prescriptions Discharge Medication List as of 08/27/2016  6:12 PM       Evalee Jefferson, PA-C 08/29/16 [place] Liu, MD 08/31/16 1521

## 2016-08-31 ENCOUNTER — Encounter: Payer: Self-pay | Admitting: Orthopedic Surgery

## 2016-08-31 ENCOUNTER — Ambulatory Visit (INDEPENDENT_AMBULATORY_CARE_PROVIDER_SITE_OTHER): Payer: Medicare HMO | Admitting: Orthopedic Surgery

## 2016-08-31 VITALS — BP 141/58 | HR 76 | Wt 207.0 lb

## 2016-08-31 DIAGNOSIS — S62347A Nondisplaced fracture of base of fifth metacarpal bone. left hand, initial encounter for closed fracture: Secondary | ICD-10-CM

## 2016-08-31 MED ORDER — HYDROCODONE-ACETAMINOPHEN 5-325 MG PO TABS: 1.0000 | ORAL_TABLET | Freq: Four times a day (QID) | ORAL | 0 refills | Status: DC | PRN

## 2016-08-31 NOTE — Progress Notes (Signed)
Patient ID: [name], female   DOB: 1941/04/07, 76 y.o.   MRN: [id number]  Chief Complaint  Patient presents with  . Hand Injury    left hand fracture, DOI 08/27/16    HPI [name] is a 76 y.o. female.   She was picking a sign up out of the ground it came out suddenly she fell and fractured her left hand  Pain over the dorsum of the left hand at the base of the fifth metacarpal 4 days. It is a dull aching pain.  Review of Systems Review of Systems  Constitutional: Negative for fever.  Skin: Positive for color change. Negative for wound.  Neurological: Negative for weakness.   (2 MINIMUM)  Past Medical History:  Diagnosis Date  . A-fib ([place])   . Chronic kidney disease   . Colon polyps   . CVA (cerebral vascular accident) ([place])   . Depression   . DM (diabetes mellitus) ([place])   . GERD (gastroesophageal reflux disease)   . HTN (hypertension)   . Hypercholesterolemia   . Hypothyroidism   . Pancreatitis    biliary  . S/P colonoscopy March 2006   Dr. Olevia Perches: per e-chart report: diverticulosis, path adenomatous polyps  . S/P endoscopy March 2009   multiple 3-4 mmsessile gastric polyps, benign, singl antral erosions, negative H.pylori, reactive gastropathy ? NSAIDs  . 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 CHOLECYSTECTOMY    . COLONOSCOPY  03/10/2011   Procedure: COLONOSCOPY;  Surgeon: Daneil Dolin, MD;  [facility];  Service: Endoscopy;  Laterality: N/A;  12:45  . PACEMAKER INSERTION  02/05/12   SJM Accent DR RF implanted by Dr [name] for symptomatic bradycardia  . PERMANENT PACEMAKER INSERTION N/A 02/05/2012   Procedure: PERMANENT PACEMAKER INSERTION;  Surgeon: Thompson Grayer, MD;  Location: [facility] CATH LAB;  Service: Cardiovascular;  Laterality: N/A;  . SHOULDER SURGERY     left    Social History Social History  Substance Use Topics  . Smoking status: Never Smoker   . Smokeless tobacco: Never Used  . Alcohol use No    Allergies  Allergen Reactions  . Other Hives    Florinal for headaches.  . Penicillins Rash  . Tetanus Toxoids Rash    Current Meds  Medication Sig  . atorvastatin (LIPITOR) 40 MG tablet Take 40 mg by mouth daily.    . calcitRIOL (ROCALTROL) 0.25 MCG capsule Take 2 tablets by mouth every other day and take 1 tablet daily on the other days  . citalopram (CELEXA) 20 MG tablet Take 20 mg by mouth daily.    . DHA-EPA-Flaxseed Oil-Vitamin E CAPS Take 1 capsule by mouth 2 (two) times daily.  . flecainide (TAMBOCOR) 100 MG tablet Take 100 mg by mouth 2 (two) times daily.    . fluticasone (FLONASE) 50 MCG/ACT nasal spray Place 2 sprays into the nose daily as needed. For congestion  . furosemide (LASIX) 80 MG tablet Take 80 mg by mouth 2 (two) times daily.   Marland Kitchen gabapentin (NEURONTIN) 100 MG capsule Take 1 capsule (100 mg total) by mouth 3 (three) times daily.  Marland Kitchen glipiZIDE (GLUCOTROL XL) 10 MG 24 hr tablet Take 10 mg by mouth daily.    Marland Kitchen levothyroxine (SYNTHROID, LEVOTHROID) 75 MCG tablet Take 75 mcg by mouth daily.    Marland Kitchen omeprazole (PRILOSEC) 40 MG capsule Take 40 mg by mouth daily.    Marland Kitchen  potassium chloride SA (K-DUR,KLOR-CON) 20 MEQ tablet Take 1 tablet by mouth 2 (two) times daily.  . vitamin B-12 (CYANOCOBALAMIN) 1000 MCG tablet Take 5,000 mcg by mouth daily.  Marland Kitchen warfarin (COUMADIN) 5 MG tablet Take as directed by the coumadin clinic  . [DISCONTINUED] HYDROcodone-acetaminophen (NORCO/VICODIN) 5-325 MG tablet Take 1 tablet by mouth every 6 (six) hours as needed for moderate pain.   Current Facility-Administered Medications for the 08/31/16 encounter (Office Visit) with Carole Civil, MD  Medication  . triamcinolone acetonide (KENALOG) 10 MG/ML injection 10 mg      Physical Exam Physical Exam 1.BP (!) 141/58   Pulse 76   Wt 207 lb (93.9 kg)   BMI 32.42 kg/m   2. Gen. appearance. The patient is well-developed and  well-nourished, grooming and hygiene are normal. There are no gross congenital abnormalities  3. The patient is alert and oriented to person place and time  4. Mood and affect are normal  5. Ambulation Is normal without any assistive device  Examination reveals the following: 6. On inspection we find ecchymosis of the skin with swelling and tenderness at the base of the fifth metacarpal at the carpometacarpal joint  7. With the range of motion of  left wrist slightly decreased  8. Stability tests were normal  in the left wrist  9. Motor exam revealed normal muscle tone in the hand  10. Skin we find no rash ulceration or erythema, we do find ecchymosis throughout the upper extremity on the left  11. Sensation remains intact left hand  12 Vascular system shows no peripheral edema left upper extremity  Right upper extremity she complained of some pain over the right thumb but there was no tenderness MEDICAL DECISION MAKING:    Data Reviewed X-rays show a CMC fracture base fifth metacarpal nondisplaced  Assessment Encounter Diagnosis  Name Primary?  . Closed nondisplaced fracture of base of fifth metacarpal bone of left hand, initial encounter Yes     Plan SPLINT X [place] 08/31/2016, 10:48 AM

## 2016-09-07 ENCOUNTER — Telehealth: Payer: Self-pay | Admitting: Orthopedic Surgery

## 2016-09-07 NOTE — Telephone Encounter (Signed)
Patient called asking if Dr. Aline Brochure would take a look at her X-rays again and please pay close attention to the middle finger. Her middle finger is swollen, no bruising, red with warmth until today. It is very painful and this has been going on since her visit on 08/31/16. She was seen for ring and little finger fxs.  Please call and advise  [phone]

## 2016-09-07 NOTE — Telephone Encounter (Signed)
Routing to Dr Harrison for review 

## 2016-09-07 NOTE — Telephone Encounter (Signed)
Looked at left and right hand   No fracture of the middle finger

## 2016-09-08 NOTE — Telephone Encounter (Signed)
Patient did call back; relayed Dr Harrison's response, as noted.  She states the Left middle Finder is red and swollen, since the weekend.  Michela Pitcher it has "calmed down some" but still is concerned.  Please call at Cell Ph# [phone] to advise.

## 2016-09-08 NOTE — Telephone Encounter (Signed)
Returned call, no answer.

## 2016-09-08 NOTE — Telephone Encounter (Signed)
Offer her an appt with Dr Luna Glasgow???

## 2016-09-09 NOTE — Telephone Encounter (Signed)
Called patient to offer appointment with Dr Luna Glasgow as per Dr Ruthe Mannan note.

## 2016-09-10 ENCOUNTER — Encounter: Payer: Self-pay | Admitting: Orthopaedic Surgery

## 2016-09-10 ENCOUNTER — Ambulatory Visit (INDEPENDENT_AMBULATORY_CARE_PROVIDER_SITE_OTHER): Payer: Medicare HMO | Admitting: Orthopaedic Surgery

## 2016-09-10 ENCOUNTER — Ambulatory Visit (INDEPENDENT_AMBULATORY_CARE_PROVIDER_SITE_OTHER): Payer: Medicare HMO

## 2016-09-10 VITALS — BP 129/63 | HR 82 | Temp 97.7°F | Ht 65.5 in | Wt 202.0 lb

## 2016-09-10 DIAGNOSIS — M10042 Idiopathic gout, left hand: Secondary | ICD-10-CM | POA: Diagnosis not present

## 2016-09-10 DIAGNOSIS — M109 Gout, unspecified: Secondary | ICD-10-CM | POA: Diagnosis not present

## 2016-09-10 DIAGNOSIS — M79645 Pain in left finger(s): Secondary | ICD-10-CM

## 2016-09-10 NOTE — Progress Notes (Signed)
Patient [name], female DOB:08-09-1940, 76 y.o. JHE:[id number]  Chief Complaint  Patient presents with  . Hand Pain    left middle finger swelling. She has 4th and 5th Metacarpal fractures.  DOB [birth year]    HPI  [name] is a 76 y.o. female who is a patient of Dr. Aline Brochure who has seen her for a fracture of the left hand.  She has developed swelling of the left long finger at the PIP joint.  She has no trauma there.  She has had some swelling and redness.  She has no numbness.  She has history of gout.  She is taking something for gout but does not know the name.  She has had gout in both feet in the past. HPI  Body mass index is 33.1 kg/m.  ROS  Review of Systems  Constitutional: Negative for fever.  Skin: Positive for color change. Negative for wound.  Neurological: Negative for weakness.    Past Medical History:  Diagnosis Date  . A-fib ([place])   . Chronic kidney disease   . Colon polyps   . CVA (cerebral vascular accident) ([place])   . Depression   . DM (diabetes mellitus) ([place])   . GERD (gastroesophageal reflux disease)   . HTN (hypertension)   . Hypercholesterolemia   . Hypothyroidism   . Pancreatitis    biliary  . S/P colonoscopy March 2006   Dr. Olevia Perches: per e-chart report: diverticulosis, path adenomatous polyps  . S/P endoscopy March 2009   multiple 3-4 mmsessile gastric polyps, benign, singl antral erosions, negative H.pylori, reactive gastropathy ? NSAIDs  . 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 CHOLECYSTECTOMY    . COLONOSCOPY  03/10/2011   Procedure: COLONOSCOPY;  Surgeon: Daneil Dolin, MD;  [facility];  Service: Endoscopy;  Laterality: N/A;  12:45  . PACEMAKER INSERTION  02/05/12   SJM Accent DR RF implanted by Dr [name] for symptomatic bradycardia  . PERMANENT PACEMAKER INSERTION N/A 02/05/2012   Procedure: PERMANENT PACEMAKER INSERTION;   Surgeon: Thompson Grayer, MD;  Location: [facility] CATH LAB;  Service: Cardiovascular;  Laterality: N/A;  . SHOULDER SURGERY     left    Family History  Problem Relation Age of Onset  . Heart disease    . Arthritis    . Lung disease    . Diabetes    . Kidney disease    . Colon cancer Neg Hx     Social History Social History  Substance Use Topics  . Smoking status: Never Smoker  . Smokeless tobacco: Never Used  . Alcohol use No    Allergies  Allergen Reactions  . Other Hives    Florinal for headaches.  . Penicillins Rash  . Tetanus Toxoids Rash    Current Outpatient Prescriptions  Medication Sig Dispense Refill  . atorvastatin (LIPITOR) 40 MG tablet Take 40 mg by mouth daily.      . calcitRIOL (ROCALTROL) 0.25 MCG capsule Take 2 tablets by mouth every other day and take 1 tablet daily on the other days    . citalopram (CELEXA) 20 MG tablet Take 20 mg by mouth daily.      . DHA-EPA-Flaxseed Oil-Vitamin E CAPS Take 1 capsule by mouth 2 (two) times daily.    . flecainide (TAMBOCOR) 100 MG tablet Take 100 mg by mouth 2 (two) times daily.      Marland Kitchen  fluticasone (FLONASE) 50 MCG/ACT nasal spray Place 2 sprays into the nose daily as needed. For congestion    . furosemide (LASIX) 80 MG tablet Take 80 mg by mouth 2 (two) times daily.     Marland Kitchen gabapentin (NEURONTIN) 100 MG capsule Take 1 capsule (100 mg total) by mouth 3 (three) times daily. 90 capsule 5  . glipiZIDE (GLUCOTROL XL) 10 MG 24 hr tablet Take 10 mg by mouth daily.      Marland Kitchen HYDROcodone-acetaminophen (NORCO/VICODIN) 5-325 MG tablet Take 1 tablet by mouth every 6 (six) hours as needed for moderate pain. 30 tablet 0  . levothyroxine (SYNTHROID, LEVOTHROID) 75 MCG tablet Take 75 mcg by mouth daily.      Marland Kitchen omeprazole (PRILOSEC) 40 MG capsule Take 40 mg by mouth daily.      . potassium chloride SA (K-DUR,KLOR-CON) 20 MEQ tablet Take 1 tablet by mouth 2 (two) times daily.    . vitamin B-12 (CYANOCOBALAMIN) 1000 MCG tablet Take 5,000 mcg by mouth  daily.    Marland Kitchen warfarin (COUMADIN) 5 MG tablet Take as directed by the coumadin clinic     Current Facility-Administered Medications  Medication Dose Route Frequency Provider Last Rate Last Dose  . triamcinolone acetonide (KENALOG) 10 MG/ML injection 10 mg  10 mg Other Once Landis Martins, DPM         Physical Exam  Blood pressure 129/63, pulse 82, temperature 97.7 F (36.5 C), height 5' 5.5" (1.664 m), weight 202 lb (91.6 kg).  Constitutional: overall normal hygiene, normal nutrition, well developed, normal grooming, normal body habitus. Assistive device:left hand cock-up splint  Musculoskeletal: gait and station Limp none, muscle tone and strength are normal, no tremors or atrophy is present.  .  Neurological: coordination overall normal.  Deep tendon reflex/nerve stretch intact.  Sensation normal.  Cranial nerves II-XII intact.   Skin:   Normal overall no scars, lesions, ulcers or rashes. No psoriasis.  Psychiatric: Alert and oriented x 3.  Recent memory intact, remote memory unclear.  Normal mood and affect. Well groomed.  Good eye contact.  Cardiovascular: overall no swelling, no varicosities, no edema bilaterally, normal temperatures of the legs and arms, no clubbing, cyanosis and good capillary refill.  Lymphatic: palpation is normal.  Her left long finger has fusiform swelling and pain, ROM 5 to 45 degrees, the finger is stable.  No redness is present.  No fluctuance is present.  It is tender.  The patient has been educated about the nature of the problem(s) and counseled on treatment options.  The patient appeared to understand what I have discussed and is in agreement with it.  Encounter Diagnoses  Name Primary?  . Pain of left middle finger Yes  . Acute idiopathic gout of left hand    X-rays were done of the left long finger, reported separately.  PLAN Call if any problems.  Precautions discussed.  Continue current medications.   Return to clinic 1 week   I have  given samples of Uloric 80 to her.  I have ordered serum uric acid levels.  Continue the splint.  Electronically Signed Sanjuana Kava, MD 4/19/20182:57 PM

## 2016-09-11 LAB — URIC ACID: Uric Acid, Serum: 6.6 mg/dL (ref 2.5–7.0)

## 2016-09-16 DIAGNOSIS — I10 Essential (primary) hypertension: Secondary | ICD-10-CM | POA: Diagnosis not present

## 2016-09-16 DIAGNOSIS — E1122 Type 2 diabetes mellitus with diabetic chronic kidney disease: Secondary | ICD-10-CM | POA: Diagnosis not present

## 2016-09-16 DIAGNOSIS — Z6831 Body mass index (BMI) 31.0-31.9, adult: Secondary | ICD-10-CM | POA: Diagnosis not present

## 2016-09-16 DIAGNOSIS — N184 Chronic kidney disease, stage 4 (severe): Secondary | ICD-10-CM | POA: Diagnosis not present

## 2016-09-16 DIAGNOSIS — I482 Chronic atrial fibrillation: Secondary | ICD-10-CM | POA: Diagnosis not present

## 2016-09-16 DIAGNOSIS — Z7901 Long term (current) use of anticoagulants: Secondary | ICD-10-CM | POA: Diagnosis not present

## 2016-09-17 ENCOUNTER — Ambulatory Visit (INDEPENDENT_AMBULATORY_CARE_PROVIDER_SITE_OTHER): Payer: Medicare HMO | Admitting: *Deleted

## 2016-09-17 ENCOUNTER — Encounter: Payer: Self-pay | Admitting: Orthopaedic Surgery

## 2016-09-17 ENCOUNTER — Ambulatory Visit (INDEPENDENT_AMBULATORY_CARE_PROVIDER_SITE_OTHER): Payer: Medicare HMO | Admitting: Orthopaedic Surgery

## 2016-09-17 VITALS — BP 165/83 | HR 71 | Temp 96.8°F | Ht 65.5 in | Wt 204.0 lb

## 2016-09-17 DIAGNOSIS — M79645 Pain in left finger(s): Secondary | ICD-10-CM | POA: Diagnosis not present

## 2016-09-17 DIAGNOSIS — I495 Sick sinus syndrome: Secondary | ICD-10-CM

## 2016-09-17 DIAGNOSIS — M10042 Idiopathic gout, left hand: Secondary | ICD-10-CM

## 2016-09-17 NOTE — Progress Notes (Signed)
Remote pacemaker transmission.   

## 2016-09-17 NOTE — Progress Notes (Signed)
Patient IP:[name], female DOB:[birth year], 76 y.o. LZJ:[id number]  Chief Complaint  Patient presents with  . Follow-up    gout    HPI  [name] is a 76 y.o. female who has acute gout of the left long PIP joint following recent fracture of the left hand.  I saw her and began Uloric.  She has been on allopurinol but can only take 200 mgm a day secondary to kidney problems.  She responded very well to the Uloric and the finger swelling has resolved as well has her pain.  I have talked to her in detail about gout.  She may need to be switched to Uloric if kidney function should decrease further.  Her uric acid level was 6.6 normal up to 7.  I feel she has acute gout of the finger.  The fracture "stirred things up". HPI  Body mass index is 33.43 kg/m.  ROS  Review of Systems  Constitutional: Negative for fever.  Skin: Positive for color change. Negative for wound.  Neurological: Negative for weakness.    Past Medical History:  Diagnosis Date  . A-fib ([place])   . Chronic kidney disease   . Colon polyps   . CVA (cerebral vascular accident) ([place])   . Depression   . DM (diabetes mellitus) ([place])   . GERD (gastroesophageal reflux disease)   . HTN (hypertension)   . Hypercholesterolemia   . Hypothyroidism   . Pancreatitis    biliary  . S/P colonoscopy March 2006   Dr. Olevia Perches: per e-chart report: diverticulosis, path adenomatous polyps  . S/P endoscopy March 2009   multiple 3-4 mmsessile gastric polyps, benign, singl antral erosions, negative H.pylori, reactive gastropathy ? NSAIDs  . 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 CHOLECYSTECTOMY    . COLONOSCOPY  03/10/2011   Procedure: COLONOSCOPY;  Surgeon: Daneil Dolin, MD;  [facility];  Service: Endoscopy;  Laterality: N/A;  12:45  . PACEMAKER INSERTION  02/05/12   SJM Accent DR RF implanted by Dr [name] for symptomatic  bradycardia  . PERMANENT PACEMAKER INSERTION N/A 02/05/2012   Procedure: PERMANENT PACEMAKER INSERTION;  Surgeon: Thompson Grayer, MD;  Location: [facility] CATH LAB;  Service: Cardiovascular;  Laterality: N/A;  . SHOULDER SURGERY     left    Family History  Problem Relation Age of Onset  . Heart disease    . Arthritis    . Lung disease    . Diabetes    . Kidney disease    . Colon cancer Neg Hx     Social History Social History  Substance Use Topics  . Smoking status: Never Smoker  . Smokeless tobacco: Never Used  . Alcohol use No    Allergies  Allergen Reactions  . Other Hives    Florinal for headaches.  . Penicillins Rash  . Tetanus Toxoids Rash    Current Outpatient Prescriptions  Medication Sig Dispense Refill  . atorvastatin (LIPITOR) 40 MG tablet Take 40 mg by mouth daily.      . calcitRIOL (ROCALTROL) 0.25 MCG capsule Take 2 tablets by mouth every other day and take 1 tablet daily on the other days    . citalopram (CELEXA) 20 MG tablet Take 20 mg by mouth daily.      . DHA-EPA-Flaxseed Oil-Vitamin E CAPS Take 1 capsule by mouth 2 (two) times daily.    . flecainide (TAMBOCOR)  100 MG tablet Take 100 mg by mouth 2 (two) times daily.      . fluticasone (FLONASE) 50 MCG/ACT nasal spray Place 2 sprays into the nose daily as needed. For congestion    . furosemide (LASIX) 80 MG tablet Take 80 mg by mouth 2 (two) times daily.     Marland Kitchen gabapentin (NEURONTIN) 100 MG capsule Take 1 capsule (100 mg total) by mouth 3 (three) times daily. 90 capsule 5  . glipiZIDE (GLUCOTROL XL) 10 MG 24 hr tablet Take 10 mg by mouth daily.      Marland Kitchen HYDROcodone-acetaminophen (NORCO/VICODIN) 5-325 MG tablet Take 1 tablet by mouth every 6 (six) hours as needed for moderate pain. 30 tablet 0  . levothyroxine (SYNTHROID, LEVOTHROID) 75 MCG tablet Take 75 mcg by mouth daily.      Marland Kitchen omeprazole (PRILOSEC) 40 MG capsule Take 40 mg by mouth daily.      . potassium chloride SA (K-DUR,KLOR-CON) 20 MEQ tablet Take 1 tablet  by mouth 2 (two) times daily.    . vitamin B-12 (CYANOCOBALAMIN) 1000 MCG tablet Take 5,000 mcg by mouth daily.    Marland Kitchen warfarin (COUMADIN) 5 MG tablet Take as directed by the coumadin clinic     Current Facility-Administered Medications  Medication Dose Route Frequency Provider Last Rate Last Dose  . triamcinolone acetonide (KENALOG) 10 MG/ML injection 10 mg  10 mg Other Once Landis Martins, DPM         Physical Exam  Blood pressure (!) 165/83, pulse 71, temperature (!) 96.8 F (36 C), height 5' 5.5" (1.664 m), weight 204 lb (92.5 kg).  Constitutional: overall normal hygiene, normal nutrition, well developed, normal grooming, normal body habitus. Assistive device:none, cockup splint  Musculoskeletal: gait and station Limp none, muscle tone and strength are normal, no tremors or atrophy is present.  .  Neurological: coordination overall normal.  Deep tendon reflex/nerve stretch intact.  Sensation normal.  Cranial nerves II-XII intact.   Skin:   Normal overall no scars, lesions, ulcers or rashes. No psoriasis.  Psychiatric: Alert and oriented x 3.  Recent memory intact, remote memory unclear.  Normal mood and affect. Well groomed.  Good eye contact.  Cardiovascular: overall no swelling, no varicosities, no edema bilaterally, normal temperatures of the legs and arms, no clubbing, cyanosis and good capillary refill.  Lymphatic: palpation is normal.  Her left long finger has just slight tenderness at the PIP joint, no redness, ROM is full except she cannot fully extend lacking 5 degrees.  NV intact.    The patient has been educated about the nature of the problem(s) and counseled on treatment options.  The patient appeared to understand what I have discussed and is in agreement with it.  Encounter Diagnoses  Name Primary?  . Pain of left middle finger Yes  . Acute idiopathic gout of left hand     PLAN Call if any problems.  Precautions discussed.  Continue current medications.    Return to clinic prn   Electronically Signed Sanjuana Kava, MD 4/26/20182:59 PM

## 2016-09-18 ENCOUNTER — Encounter: Payer: Self-pay | Admitting: Cardiology

## 2016-09-18 LAB — CUP PACEART REMOTE DEVICE CHECK
Brady Statistic AP VP Percent: 2.4 %
Brady Statistic AP VS Percent: 67 %
Brady Statistic AS VS Percent: 30 %
Brady Statistic RV Percent Paced: 2.5 %
Date Time Interrogation Session: 20180426154450
Implantable Lead Implant Date: 20130913
Implantable Lead Location: 753860
Implantable Lead Model: 1948
Implantable Pulse Generator Implant Date: 20130913
Lead Channel Impedance Value: 600 Ohm
Lead Channel Pacing Threshold Amplitude: 1.25 V
Lead Channel Pacing Threshold Pulse Width: 0.5 ms
Lead Channel Setting Pacing Amplitude: 2 V
Lead Channel Setting Pacing Amplitude: 2.5 V
Lead Channel Setting Pacing Pulse Width: 0.5 ms
Lead Channel Setting Sensing Sensitivity: 2 mV
MDC IDC LEAD IMPLANT DT: 20130913
MDC IDC LEAD LOCATION: 753859
MDC IDC MSMT BATTERY REMAINING LONGEVITY: 98 mo
MDC IDC MSMT BATTERY REMAINING PERCENTAGE: 81 %
MDC IDC MSMT BATTERY VOLTAGE: 2.93 V
MDC IDC MSMT LEADCHNL RA IMPEDANCE VALUE: 530 Ohm
MDC IDC MSMT LEADCHNL RA PACING THRESHOLD AMPLITUDE: 0.75 V
MDC IDC MSMT LEADCHNL RA PACING THRESHOLD PULSEWIDTH: 0.5 ms
MDC IDC MSMT LEADCHNL RA SENSING INTR AMPL: 2.8 mV
MDC IDC MSMT LEADCHNL RV SENSING INTR AMPL: 10.9 mV
MDC IDC STAT BRADY AS VP PERCENT: 1 %
MDC IDC STAT BRADY RA PERCENT PACED: 70 %
Pulse Gen Model: 2210
Pulse Gen Serial Number: 7382865

## 2016-09-24 ENCOUNTER — Telehealth: Payer: Self-pay

## 2016-09-24 NOTE — Telephone Encounter (Signed)
Pt request refill for Hydrocodone

## 2016-09-28 ENCOUNTER — Other Ambulatory Visit (HOSPITAL_COMMUNITY): Payer: Self-pay | Admitting: Internal Medicine

## 2016-09-28 ENCOUNTER — Other Ambulatory Visit: Payer: Self-pay | Admitting: *Deleted

## 2016-09-28 DIAGNOSIS — Z1231 Encounter for screening mammogram for malignant neoplasm of breast: Secondary | ICD-10-CM

## 2016-09-28 DIAGNOSIS — S62347A Nondisplaced fracture of base of fifth metacarpal bone. left hand, initial encounter for closed fracture: Secondary | ICD-10-CM

## 2016-09-28 MED ORDER — HYDROCODONE-ACETAMINOPHEN 5-325 MG PO TABS: 1.0000 | ORAL_TABLET | Freq: Four times a day (QID) | ORAL | 0 refills | Status: DC | PRN

## 2016-09-30 DIAGNOSIS — Z6832 Body mass index (BMI) 32.0-32.9, adult: Secondary | ICD-10-CM | POA: Diagnosis not present

## 2016-09-30 DIAGNOSIS — E1122 Type 2 diabetes mellitus with diabetic chronic kidney disease: Secondary | ICD-10-CM | POA: Diagnosis not present

## 2016-09-30 DIAGNOSIS — I482 Chronic atrial fibrillation: Secondary | ICD-10-CM | POA: Diagnosis not present

## 2016-09-30 DIAGNOSIS — N939 Abnormal uterine and vaginal bleeding, unspecified: Secondary | ICD-10-CM | POA: Diagnosis not present

## 2016-09-30 DIAGNOSIS — I10 Essential (primary) hypertension: Secondary | ICD-10-CM | POA: Diagnosis not present

## 2016-09-30 DIAGNOSIS — M1990 Unspecified osteoarthritis, unspecified site: Secondary | ICD-10-CM | POA: Diagnosis not present

## 2016-09-30 DIAGNOSIS — Z7901 Long term (current) use of anticoagulants: Secondary | ICD-10-CM | POA: Diagnosis not present

## 2016-10-02 ENCOUNTER — Encounter: Payer: Self-pay | Admitting: Cardiology

## 2016-10-13 ENCOUNTER — Ambulatory Visit (INDEPENDENT_AMBULATORY_CARE_PROVIDER_SITE_OTHER): Payer: Medicare HMO

## 2016-10-13 ENCOUNTER — Encounter: Payer: Self-pay | Admitting: Orthopedic Surgery

## 2016-10-13 ENCOUNTER — Ambulatory Visit (INDEPENDENT_AMBULATORY_CARE_PROVIDER_SITE_OTHER): Payer: Self-pay | Admitting: Orthopaedic Surgery

## 2016-10-13 DIAGNOSIS — S62347D Nondisplaced fracture of base of fifth metacarpal bone. left hand, subsequent encounter for fracture with routine healing: Secondary | ICD-10-CM

## 2016-10-13 NOTE — Progress Notes (Signed)
CC:  My hand is a little sore at times  She has done well with the left hand fracture.  She has used the cock-up splint.  She has no new trauma.  NV intact. ROM is full.  X-rays were done and reported separately.  Encounter Diagnosis  Name Primary?  . Closed nondisplaced fracture of base of fifth metacarpal bone of left hand with routine healing, subsequent encounter Yes    Discharge.  Precautions given.  Call if any problem.  Electronically Signed Sanjuana Kava, MD 5/22/20182:36 PM

## 2016-10-14 ENCOUNTER — Ambulatory Visit (HOSPITAL_COMMUNITY)
Admission: RE | Admit: 2016-10-14 | Discharge: 2016-10-14 | Disposition: A | Discharge status: Home / Self Care | Payer: Medicare HMO | Source: Ambulatory Visit | Attending: Internal Medicine | Admitting: Internal Medicine

## 2016-10-14 ENCOUNTER — Ambulatory Visit (HOSPITAL_COMMUNITY): Payer: Medicare HMO

## 2016-10-14 DIAGNOSIS — Z1231 Encounter for screening mammogram for malignant neoplasm of breast: Secondary | ICD-10-CM

## 2016-11-04 DIAGNOSIS — E7529 Other sphingolipidosis: Secondary | ICD-10-CM | POA: Diagnosis not present

## 2016-11-04 DIAGNOSIS — I482 Chronic atrial fibrillation: Secondary | ICD-10-CM | POA: Diagnosis not present

## 2016-11-04 DIAGNOSIS — Z7901 Long term (current) use of anticoagulants: Secondary | ICD-10-CM | POA: Diagnosis not present

## 2016-11-04 DIAGNOSIS — Z791 Long term (current) use of non-steroidal anti-inflammatories (NSAID): Secondary | ICD-10-CM | POA: Diagnosis not present

## 2016-11-04 DIAGNOSIS — E1129 Type 2 diabetes mellitus with other diabetic kidney complication: Secondary | ICD-10-CM | POA: Diagnosis not present

## 2016-11-04 DIAGNOSIS — E1122 Type 2 diabetes mellitus with diabetic chronic kidney disease: Secondary | ICD-10-CM | POA: Diagnosis not present

## 2016-11-04 DIAGNOSIS — N184 Chronic kidney disease, stage 4 (severe): Secondary | ICD-10-CM | POA: Diagnosis not present

## 2016-11-04 DIAGNOSIS — I10 Essential (primary) hypertension: Secondary | ICD-10-CM | POA: Diagnosis not present

## 2016-11-04 DIAGNOSIS — M1990 Unspecified osteoarthritis, unspecified site: Secondary | ICD-10-CM | POA: Diagnosis not present

## 2016-11-09 DIAGNOSIS — Z7901 Long term (current) use of anticoagulants: Secondary | ICD-10-CM | POA: Diagnosis not present

## 2016-11-09 DIAGNOSIS — I482 Chronic atrial fibrillation: Secondary | ICD-10-CM | POA: Diagnosis not present

## 2016-12-02 DIAGNOSIS — I482 Chronic atrial fibrillation: Secondary | ICD-10-CM | POA: Diagnosis not present

## 2016-12-02 DIAGNOSIS — Z6831 Body mass index (BMI) 31.0-31.9, adult: Secondary | ICD-10-CM | POA: Diagnosis not present

## 2016-12-02 DIAGNOSIS — M1990 Unspecified osteoarthritis, unspecified site: Secondary | ICD-10-CM | POA: Diagnosis not present

## 2016-12-17 ENCOUNTER — Ambulatory Visit (INDEPENDENT_AMBULATORY_CARE_PROVIDER_SITE_OTHER): Payer: Medicare HMO | Admitting: *Deleted

## 2016-12-17 DIAGNOSIS — I495 Sick sinus syndrome: Secondary | ICD-10-CM | POA: Diagnosis not present

## 2016-12-17 NOTE — Progress Notes (Signed)
Remote pacemaker transmission.   

## 2016-12-18 ENCOUNTER — Encounter: Payer: Self-pay | Admitting: Cardiology

## 2017-01-06 DIAGNOSIS — I482 Chronic atrial fibrillation: Secondary | ICD-10-CM | POA: Diagnosis not present

## 2017-01-06 DIAGNOSIS — Z7901 Long term (current) use of anticoagulants: Secondary | ICD-10-CM | POA: Diagnosis not present

## 2017-01-06 DIAGNOSIS — M1990 Unspecified osteoarthritis, unspecified site: Secondary | ICD-10-CM | POA: Diagnosis not present

## 2017-01-14 LAB — CUP PACEART REMOTE DEVICE CHECK
Battery Remaining Longevity: 96 mo
Battery Remaining Percentage: 81 %
Battery Voltage: 2.93 V
Brady Statistic AP VS Percent: 67 %
Brady Statistic AS VP Percent: 1 %
Brady Statistic RA Percent Paced: 69 %
Brady Statistic RV Percent Paced: 2.4 %
Implantable Lead Implant Date: 20130913
Implantable Lead Location: 753859
Implantable Lead Model: 1948
Lead Channel Impedance Value: 490 Ohm
Lead Channel Pacing Threshold Amplitude: 0.75 V
Lead Channel Pacing Threshold Amplitude: 1.25 V
Lead Channel Pacing Threshold Pulse Width: 0.5 ms
Lead Channel Sensing Intrinsic Amplitude: 2.9 mV
Lead Channel Setting Pacing Amplitude: 2 V
Lead Channel Setting Pacing Pulse Width: 0.5 ms
Lead Channel Setting Sensing Sensitivity: 2 mV
MDC IDC LEAD IMPLANT DT: 20130913
MDC IDC LEAD LOCATION: 753860
MDC IDC MSMT LEADCHNL RV IMPEDANCE VALUE: 580 Ohm
MDC IDC MSMT LEADCHNL RV PACING THRESHOLD PULSEWIDTH: 0.5 ms
MDC IDC MSMT LEADCHNL RV SENSING INTR AMPL: 10.3 mV
MDC IDC PG IMPLANT DT: 20130913
MDC IDC PG SERIAL: 7382865
MDC IDC SESS DTM: 20180726070745
MDC IDC SET LEADCHNL RV PACING AMPLITUDE: 2.5 V
MDC IDC STAT BRADY AP VP PERCENT: 2.4 %
MDC IDC STAT BRADY AS VS PERCENT: 31 %

## 2017-01-18 ENCOUNTER — Encounter: Payer: Self-pay | Admitting: Cardiology

## 2017-01-20 DIAGNOSIS — E782 Mixed hyperlipidemia: Secondary | ICD-10-CM | POA: Diagnosis not present

## 2017-01-20 DIAGNOSIS — I480 Paroxysmal atrial fibrillation: Secondary | ICD-10-CM | POA: Diagnosis not present

## 2017-01-20 DIAGNOSIS — M109 Gout, unspecified: Secondary | ICD-10-CM | POA: Diagnosis not present

## 2017-01-20 DIAGNOSIS — I129 Hypertensive chronic kidney disease with stage 1 through stage 4 chronic kidney disease, or unspecified chronic kidney disease: Secondary | ICD-10-CM | POA: Diagnosis not present

## 2017-01-20 DIAGNOSIS — Z950 Presence of cardiac pacemaker: Secondary | ICD-10-CM | POA: Diagnosis not present

## 2017-01-20 DIAGNOSIS — E039 Hypothyroidism, unspecified: Secondary | ICD-10-CM | POA: Diagnosis not present

## 2017-01-20 DIAGNOSIS — D631 Anemia in chronic kidney disease: Secondary | ICD-10-CM | POA: Diagnosis not present

## 2017-01-20 DIAGNOSIS — N184 Chronic kidney disease, stage 4 (severe): Secondary | ICD-10-CM | POA: Diagnosis not present

## 2017-01-20 DIAGNOSIS — N2581 Secondary hyperparathyroidism of renal origin: Secondary | ICD-10-CM | POA: Diagnosis not present

## 2017-01-20 DIAGNOSIS — E1129 Type 2 diabetes mellitus with other diabetic kidney complication: Secondary | ICD-10-CM | POA: Diagnosis not present

## 2017-02-16 DIAGNOSIS — E559 Vitamin D deficiency, unspecified: Secondary | ICD-10-CM | POA: Diagnosis not present

## 2017-02-16 DIAGNOSIS — E039 Hypothyroidism, unspecified: Secondary | ICD-10-CM | POA: Diagnosis not present

## 2017-02-16 DIAGNOSIS — E1122 Type 2 diabetes mellitus with diabetic chronic kidney disease: Secondary | ICD-10-CM | POA: Diagnosis not present

## 2017-02-16 DIAGNOSIS — E782 Mixed hyperlipidemia: Secondary | ICD-10-CM | POA: Diagnosis not present

## 2017-02-18 DIAGNOSIS — M1990 Unspecified osteoarthritis, unspecified site: Secondary | ICD-10-CM | POA: Diagnosis not present

## 2017-02-18 DIAGNOSIS — N184 Chronic kidney disease, stage 4 (severe): Secondary | ICD-10-CM | POA: Diagnosis not present

## 2017-02-18 DIAGNOSIS — I10 Essential (primary) hypertension: Secondary | ICD-10-CM | POA: Diagnosis not present

## 2017-02-18 DIAGNOSIS — Z23 Encounter for immunization: Secondary | ICD-10-CM | POA: Diagnosis not present

## 2017-02-18 DIAGNOSIS — E790 Hyperuricemia without signs of inflammatory arthritis and tophaceous disease: Secondary | ICD-10-CM | POA: Diagnosis not present

## 2017-02-18 DIAGNOSIS — I482 Chronic atrial fibrillation: Secondary | ICD-10-CM | POA: Diagnosis not present

## 2017-02-18 DIAGNOSIS — E1122 Type 2 diabetes mellitus with diabetic chronic kidney disease: Secondary | ICD-10-CM | POA: Diagnosis not present

## 2017-02-18 DIAGNOSIS — E782 Mixed hyperlipidemia: Secondary | ICD-10-CM | POA: Diagnosis not present

## 2017-02-18 DIAGNOSIS — E559 Vitamin D deficiency, unspecified: Secondary | ICD-10-CM | POA: Diagnosis not present

## 2017-03-10 DIAGNOSIS — Z7901 Long term (current) use of anticoagulants: Secondary | ICD-10-CM | POA: Diagnosis not present

## 2017-03-10 DIAGNOSIS — Z6829 Body mass index (BMI) 29.0-29.9, adult: Secondary | ICD-10-CM | POA: Diagnosis not present

## 2017-03-10 DIAGNOSIS — I482 Chronic atrial fibrillation: Secondary | ICD-10-CM | POA: Diagnosis not present

## 2017-03-17 DIAGNOSIS — M1990 Unspecified osteoarthritis, unspecified site: Secondary | ICD-10-CM | POA: Diagnosis not present

## 2017-03-17 DIAGNOSIS — Z6830 Body mass index (BMI) 30.0-30.9, adult: Secondary | ICD-10-CM | POA: Diagnosis not present

## 2017-03-17 DIAGNOSIS — Z7901 Long term (current) use of anticoagulants: Secondary | ICD-10-CM | POA: Diagnosis not present

## 2017-03-17 DIAGNOSIS — I482 Chronic atrial fibrillation: Secondary | ICD-10-CM | POA: Diagnosis not present

## 2017-03-18 ENCOUNTER — Encounter: Payer: Medicare HMO | Admitting: *Deleted

## 2017-03-18 ENCOUNTER — Telehealth: Payer: Self-pay | Admitting: Cardiology

## 2017-03-18 NOTE — Telephone Encounter (Signed)
LMOVM reminding pt to send remote transmission.   

## 2017-03-19 ENCOUNTER — Encounter: Payer: Self-pay | Admitting: Internal Medicine

## 2017-03-19 ENCOUNTER — Ambulatory Visit (INDEPENDENT_AMBULATORY_CARE_PROVIDER_SITE_OTHER): Payer: Medicare HMO | Admitting: Internal Medicine

## 2017-03-19 ENCOUNTER — Encounter: Payer: Self-pay | Admitting: Cardiology

## 2017-03-19 VITALS — BP 142/82 | HR 71 | Ht 65.0 in | Wt 195.8 lb

## 2017-03-19 DIAGNOSIS — I10 Essential (primary) hypertension: Secondary | ICD-10-CM | POA: Diagnosis not present

## 2017-03-19 DIAGNOSIS — R001 Bradycardia, unspecified: Secondary | ICD-10-CM

## 2017-03-19 DIAGNOSIS — I480 Paroxysmal atrial fibrillation: Secondary | ICD-10-CM

## 2017-03-19 NOTE — Progress Notes (Signed)
PCP: Celene Squibb, MD Primary Cardiologist:  Previously Dr Lattie Haw (lives in [place]) Primary EP:  Dr Levert Feinstein is a 76 y.o. female who presents today for routine electrophysiology followup.  Since last being seen in our clinic, the patient reports doing very well.  Today, she denies symptoms of palpitations, chest pain, shortness of breath,  lower extremity edema, dizziness, presyncope, or syncope.  The patient is otherwise without complaint today.   Past Medical History:  Diagnosis Date  . A-fib ([place])   . Chronic kidney disease   . Colon polyps   . CVA (cerebral vascular accident) ([place])   . Depression   . DM (diabetes mellitus) ([place])   . GERD (gastroesophageal reflux disease)   . HTN (hypertension)   . Hypercholesterolemia   . Hypothyroidism   . Pancreatitis    biliary  . S/P colonoscopy March 2006   Dr. Olevia Perches: per e-chart report: diverticulosis, path adenomatous polyps  . S/P endoscopy March 2009   multiple 3-4 mmsessile gastric polyps, benign, singl antral erosions, negative H.pylori, reactive gastropathy ? NSAIDs  . 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 CHOLECYSTECTOMY    . COLONOSCOPY  03/10/2011   Procedure: COLONOSCOPY;  Surgeon: Daneil Dolin, MD;  [facility];  Service: Endoscopy;  Laterality: N/A;  12:45  . PACEMAKER INSERTION  02/05/12   SJM Accent DR RF implanted by Dr [name] for symptomatic bradycardia  . PERMANENT PACEMAKER INSERTION N/A 02/05/2012   Procedure: PERMANENT PACEMAKER INSERTION;  Surgeon: Thompson Grayer, MD;  Location[facility] CATH LAB;  Service: Cardiovascular;  Laterality: N/A;  . SHOULDER SURGERY     left    ROS- all systems are reviewed and negative except as per HPI above  Current Outpatient Prescriptions  Medication Sig Dispense Refill  . allopurinol (ZYLOPRIM) 100 MG tablet Take 200 mg by mouth daily.    Marland Kitchen atorvastatin (LIPITOR)  40 MG tablet Take 40 mg by mouth daily.      . calcitRIOL (ROCALTROL) 0.25 MCG capsule Take 2 tablets by mouth every other day and take 1 tablet daily on the other days    . Cholecalciferol (VITAMIN D) 2000 units CAPS Take 2,000 Units by mouth daily.    . citalopram (CELEXA) 20 MG tablet Take 20 mg by mouth daily.      . DHA-EPA-Flaxseed Oil-Vitamin E CAPS Take 1 capsule by mouth 2 (two) times daily.    . flecainide (TAMBOCOR) 100 MG tablet Take 100 mg by mouth 2 (two) times daily.      . fluticasone (FLONASE) 50 MCG/ACT nasal spray Place 2 sprays into the nose daily as needed. For congestion    . furosemide (LASIX) 80 MG tablet Take 80 mg by mouth 2 (two) times daily.     Marland Kitchen gabapentin (NEURONTIN) 100 MG capsule Take 1 capsule (100 mg total) by mouth 3 (three) times daily. 90 capsule 5  . glipiZIDE (GLUCOTROL XL) 10 MG 24 hr tablet Take 10 mg by mouth daily.      Marland Kitchen HYDROcodone-acetaminophen (NORCO/VICODIN) 5-325 MG tablet Take 1 tablet by mouth every 6 (six) hours as needed for moderate pain. 30 tablet 0  . levothyroxine (SYNTHROID, LEVOTHROID) 75 MCG tablet Take 75 mcg by mouth daily.      . potassium chloride SA (K-DUR,KLOR-CON) 20 MEQ tablet Take 1 tablet by mouth 2 (two) times daily.    Marland Kitchen  vitamin B-12 (CYANOCOBALAMIN) 1000 MCG tablet Take 5,000 mcg by mouth daily.    Marland Kitchen warfarin (COUMADIN) 5 MG tablet Take as directed by the coumadin clinic     Current Facility-Administered Medications  Medication Dose Route Frequency Provider Last Rate Last Dose  . triamcinolone acetonide (KENALOG) 10 MG/ML injection 10 mg  10 mg Other Once Landis Martins, DPM        Physical Exam: Vitals:   03/19/17 1608  BP: (!) 142/82  Pulse: 71  SpO2: 98%  Weight: 195 lb 12.8 oz (88.8 kg)  Height: 5\' 5"  (1.651 m)    GEN- The patient is well appearing, alert and oriented x 3 today.   Head- normocephalic, atraumatic Eyes-  Sclera clear, conjunctiva pink Ears- hearing intact Oropharynx- clear Lungs- Clear to  ausculation bilaterally, normal work of breathing Chest- pacemaker pocket is well healed Heart- Regular rate and rhythm, no murmurs, rubs or gallops, PMI not laterally displaced GI- soft, NT, ND, + BS Extremities- no clubbing, cyanosis, or edema  Pacemaker interrogation- reviewed in detail today,  See PACEART report  ekg tracing ordered today is personally reviewed and shows sinus rhythm with first degree AV block, nonspecific ST/T changes  Assessment and Plan:  1. Symptomatic sinus bradycardia  Normal pacemaker function See Pace Art report No changes today  2. Atrial fibrillation Maintaining sinus rhythm (afib burden is <1%) No changes On coumadin (not interested in NOAC therapy)  3. CRI Stable No change required today  4. HTN Stable No change required today   Merlin Return to see EP PA in 1 year  Thompson Grayer MD, [facility] 03/19/2017 4:23 PM

## 2017-03-19 NOTE — Patient Instructions (Signed)
Medication Instructions:  Your physician recommends that you continue on your current medications as directed. Please refer to the Current Medication list given to you today.  -- If you need a refill on your cardiac medications before your next appointment, please call your pharmacy. --  Labwork: None ordered  Testing/Procedures: None ordered  Follow-Up: Your physician wants you to follow-up in: 1 year with Tommye Standard PA.  You will receive a reminder letter in the mail two months in advance. If you don't receive a letter, please call our office to schedule the follow-up appointment.  Remote monitoring is used to monitor your Pacemaker from home. This monitoring reduces the number of office visits required to check your device to one time per year. It allows [place] to keep an eye on the functioning of your device to ensure it is working properly. You are scheduled for a device check from home on 06/21/2017. You may send your transmission at any time that day. If you have a wireless device, the transmission will be sent automatically. After your physician reviews your transmission, you will receive a postcard with your next transmission date.    Thank you for choosing [facility]!!   Frederik Schmidt, RN [phone]  Any Other Special Instructions Will Be Listed Below (If Applicable).

## 2017-03-29 ENCOUNTER — Ambulatory Visit (INDEPENDENT_AMBULATORY_CARE_PROVIDER_SITE_OTHER): Payer: Medicare HMO

## 2017-03-29 ENCOUNTER — Ambulatory Visit: Payer: Medicare HMO | Admitting: Orthopedic Surgery

## 2017-03-29 VITALS — BP 116/70 | HR 79 | Ht 66.0 in | Wt 193.0 lb

## 2017-03-29 DIAGNOSIS — M25561 Pain in right knee: Secondary | ICD-10-CM

## 2017-03-29 NOTE — Progress Notes (Signed)
Progress Note   Patient ID: [name], female   DOB: 18-Feb-1941, 76 y.o.   MRN: [id number]  Chief Complaint  Patient presents with  . Knee Pain    Right knee pain, no injury    76 year old female with various medical problems presents with atraumatic onset of mild to moderate dull aching constant right knee pain associated with some aching in the posterior lateral joint the anterior joint and a mass over the anterior lateral aspect.     Review of Systems  Respiratory: Negative for shortness of breath.   Cardiovascular: Negative for palpitations.  Skin: Negative.   Neurological: Positive for tingling.   No outpatient medications have been marked as taking for the 03/29/17 encounter (Office Visit) with Carole Civil, MD.   Current Facility-Administered Medications for the 03/29/17 encounter (Office Visit) with Carole Civil, MD  Medication  . triamcinolone acetonide (KENALOG) 10 MG/ML injection 10 mg    Past Medical History:  Diagnosis Date  . A-fib ([place])   . Chronic kidney disease   . Colon polyps   . CVA (cerebral vascular accident) ([place])   . Depression   . DM (diabetes mellitus) ([place])   . GERD (gastroesophageal reflux disease)   . HTN (hypertension)   . Hypercholesterolemia   . Hypothyroidism   . Pancreatitis    biliary  . S/P colonoscopy March 2006   Dr. Olevia Perches: per e-chart report: diverticulosis, path adenomatous polyps  . S/P endoscopy March 2009   multiple 3-4 mmsessile gastric polyps, benign, singl antral erosions, negative H.pylori, reactive gastropathy ? NSAIDs  . 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 Other Hives    Florinal for headaches.  . Penicillins Rash  . Tetanus Toxoids Rash    BP 116/70   Pulse 79   Ht 5\' 6"  (1.676 m)   Wt 193 lb (87.5 kg)   BMI 31.15 kg/m    Physical Exam Gen. appearance the patient's appearance is normal with normal grooming and   hygiene The patient is oriented to person place and time Mood and affect are normal   Right Knee Exam   Muscle Strength  The patient has normal right knee strength.  Tenderness  The patient is experiencing tenderness in the medial joint line and lateral joint line.  Range of Motion  Extension: normal  Flexion: normal   Tests  McMurray:  Medial - negative Lateral - negative Varus: negative Valgus: negative Drawer:  Anterior - negative    Posterior - negative  Other  Erythema: absent Scars: absent Sensation: normal Pulse: present Swelling: none   Left Knee Exam   Muscle Strength  The patient has normal left knee strength.  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  . Right knee pain, unspecified chronicity Yes   I think she has excellent range of motion with symptomatic right knee osteoarthritis and we will inject the knee with cortisone.  Should get a good result as we have not done this before   Procedure note right knee injection verbal consent  was obtained to inject right knee joint  Timeout was completed to confirm the site of injection  The medications used were 40 mg of Depo-Medrol and 1% lidocaine 3 cc  Anesthesia was provided by ethyl chloride and the skin was prepped with alcohol.  After cleaning the skin with alcohol a 20-gauge needle was used to inject the right knee joint. There were no complications. A sterile bandage was applied.   She will see [place] on an as-needed basis  Arther Abbott, MD 03/29/2017 2:59 PM

## 2017-03-29 NOTE — Patient Instructions (Signed)

## 2017-04-01 LAB — CUP PACEART INCLINIC DEVICE CHECK
Battery Remaining Longevity: 105 mo
Battery Voltage: 2.93 V
Brady Statistic RA Percent Paced: 70 %
Brady Statistic RV Percent Paced: 3.1 %
Date Time Interrogation Session: 20181026201317
Implantable Lead Implant Date: 20130913
Implantable Lead Implant Date: 20130913
Implantable Lead Location: 753859
Implantable Lead Location: 753860
Implantable Lead Model: 1948
Implantable Pulse Generator Implant Date: 20130913
Lead Channel Impedance Value: 587.5 Ohm
Lead Channel Impedance Value: 600 Ohm
Lead Channel Pacing Threshold Amplitude: 0.75 V
Lead Channel Pacing Threshold Amplitude: 1.25 V
Lead Channel Pacing Threshold Pulse Width: 0.5 ms
Lead Channel Pacing Threshold Pulse Width: 0.5 ms
Lead Channel Sensing Intrinsic Amplitude: 11.3 mV
Lead Channel Sensing Intrinsic Amplitude: 2.9 mV
Lead Channel Setting Pacing Amplitude: 2 V
Lead Channel Setting Pacing Amplitude: 2.5 V
Lead Channel Setting Pacing Pulse Width: 0.5 ms
Lead Channel Setting Sensing Sensitivity: 2 mV
Pulse Gen Model: 2210
Pulse Gen Serial Number: 7382865

## 2017-04-07 DIAGNOSIS — E1122 Type 2 diabetes mellitus with diabetic chronic kidney disease: Secondary | ICD-10-CM | POA: Diagnosis not present

## 2017-04-07 DIAGNOSIS — E559 Vitamin D deficiency, unspecified: Secondary | ICD-10-CM | POA: Diagnosis not present

## 2017-04-07 DIAGNOSIS — Z6831 Body mass index (BMI) 31.0-31.9, adult: Secondary | ICD-10-CM | POA: Diagnosis not present

## 2017-04-07 DIAGNOSIS — I10 Essential (primary) hypertension: Secondary | ICD-10-CM | POA: Diagnosis not present

## 2017-04-07 DIAGNOSIS — N184 Chronic kidney disease, stage 4 (severe): Secondary | ICD-10-CM | POA: Diagnosis not present

## 2017-04-07 DIAGNOSIS — I482 Chronic atrial fibrillation: Secondary | ICD-10-CM | POA: Diagnosis not present

## 2017-05-05 DIAGNOSIS — Z6831 Body mass index (BMI) 31.0-31.9, adult: Secondary | ICD-10-CM | POA: Diagnosis not present

## 2017-05-05 DIAGNOSIS — I482 Chronic atrial fibrillation: Secondary | ICD-10-CM | POA: Diagnosis not present

## 2017-05-05 DIAGNOSIS — R109 Unspecified abdominal pain: Secondary | ICD-10-CM | POA: Diagnosis not present

## 2017-06-02 DIAGNOSIS — M1710 Unilateral primary osteoarthritis, unspecified knee: Secondary | ICD-10-CM | POA: Diagnosis not present

## 2017-06-02 DIAGNOSIS — N184 Chronic kidney disease, stage 4 (severe): Secondary | ICD-10-CM | POA: Diagnosis not present

## 2017-06-02 DIAGNOSIS — E1122 Type 2 diabetes mellitus with diabetic chronic kidney disease: Secondary | ICD-10-CM | POA: Diagnosis not present

## 2017-06-02 DIAGNOSIS — Z6831 Body mass index (BMI) 31.0-31.9, adult: Secondary | ICD-10-CM | POA: Diagnosis not present

## 2017-06-02 DIAGNOSIS — Z7901 Long term (current) use of anticoagulants: Secondary | ICD-10-CM | POA: Diagnosis not present

## 2017-06-02 DIAGNOSIS — E039 Hypothyroidism, unspecified: Secondary | ICD-10-CM | POA: Diagnosis not present

## 2017-06-02 DIAGNOSIS — I10 Essential (primary) hypertension: Secondary | ICD-10-CM | POA: Diagnosis not present

## 2017-06-02 DIAGNOSIS — I482 Chronic atrial fibrillation: Secondary | ICD-10-CM | POA: Diagnosis not present

## 2017-06-21 ENCOUNTER — Ambulatory Visit (INDEPENDENT_AMBULATORY_CARE_PROVIDER_SITE_OTHER): Payer: Medicare HMO | Admitting: *Deleted

## 2017-06-21 DIAGNOSIS — I495 Sick sinus syndrome: Secondary | ICD-10-CM

## 2017-06-21 NOTE — Progress Notes (Signed)
Remote pacemaker transmission.   

## 2017-06-23 ENCOUNTER — Emergency Department (HOSPITAL_COMMUNITY): Payer: Medicare HMO

## 2017-06-23 ENCOUNTER — Encounter: Payer: Self-pay | Admitting: Cardiology

## 2017-06-23 ENCOUNTER — Emergency Department (HOSPITAL_COMMUNITY)
Admission: EM | Admit: 2017-06-23 | Discharge: 2017-06-23 | Disposition: A | Discharge status: Home / Self Care | Payer: Medicare HMO | Attending: Emergency Medicine | Admitting: Emergency Medicine

## 2017-06-23 ENCOUNTER — Encounter (HOSPITAL_COMMUNITY): Payer: Self-pay

## 2017-06-23 DIAGNOSIS — Z7984 Long term (current) use of oral hypoglycemic drugs: Secondary | ICD-10-CM | POA: Diagnosis not present

## 2017-06-23 DIAGNOSIS — E119 Type 2 diabetes mellitus without complications: Secondary | ICD-10-CM | POA: Diagnosis not present

## 2017-06-23 DIAGNOSIS — Z950 Presence of cardiac pacemaker: Secondary | ICD-10-CM | POA: Diagnosis not present

## 2017-06-23 DIAGNOSIS — E1122 Type 2 diabetes mellitus with diabetic chronic kidney disease: Secondary | ICD-10-CM | POA: Diagnosis not present

## 2017-06-23 DIAGNOSIS — S8002XA Contusion of left knee, initial encounter: Secondary | ICD-10-CM | POA: Insufficient documentation

## 2017-06-23 DIAGNOSIS — W01198A Fall on same level from slipping, tripping and stumbling with subsequent striking against other object, initial encounter: Secondary | ICD-10-CM | POA: Insufficient documentation

## 2017-06-23 DIAGNOSIS — S0990XA Unspecified injury of head, initial encounter: Secondary | ICD-10-CM | POA: Diagnosis not present

## 2017-06-23 DIAGNOSIS — Y939 Activity, unspecified: Secondary | ICD-10-CM | POA: Diagnosis not present

## 2017-06-23 DIAGNOSIS — S8000XA Contusion of unspecified knee, initial encounter: Secondary | ICD-10-CM

## 2017-06-23 DIAGNOSIS — E039 Hypothyroidism, unspecified: Secondary | ICD-10-CM | POA: Insufficient documentation

## 2017-06-23 DIAGNOSIS — Z7901 Long term (current) use of anticoagulants: Secondary | ICD-10-CM | POA: Insufficient documentation

## 2017-06-23 DIAGNOSIS — S8991XA Unspecified injury of right lower leg, initial encounter: Secondary | ICD-10-CM | POA: Diagnosis not present

## 2017-06-23 DIAGNOSIS — Y929 Unspecified place or not applicable: Secondary | ICD-10-CM | POA: Insufficient documentation

## 2017-06-23 DIAGNOSIS — R51 Headache: Secondary | ICD-10-CM | POA: Diagnosis not present

## 2017-06-23 DIAGNOSIS — S199XXA Unspecified injury of neck, initial encounter: Secondary | ICD-10-CM | POA: Diagnosis not present

## 2017-06-23 DIAGNOSIS — N184 Chronic kidney disease, stage 4 (severe): Secondary | ICD-10-CM | POA: Diagnosis not present

## 2017-06-23 DIAGNOSIS — S0012XA Contusion of left eyelid and periocular area, initial encounter: Secondary | ICD-10-CM | POA: Diagnosis not present

## 2017-06-23 DIAGNOSIS — S8010XA Contusion of unspecified lower leg, initial encounter: Secondary | ICD-10-CM

## 2017-06-23 DIAGNOSIS — S8001XA Contusion of right knee, initial encounter: Secondary | ICD-10-CM | POA: Diagnosis not present

## 2017-06-23 DIAGNOSIS — I10 Essential (primary) hypertension: Secondary | ICD-10-CM | POA: Diagnosis not present

## 2017-06-23 DIAGNOSIS — S8992XA Unspecified injury of left lower leg, initial encounter: Secondary | ICD-10-CM | POA: Diagnosis not present

## 2017-06-23 DIAGNOSIS — D631 Anemia in chronic kidney disease: Secondary | ICD-10-CM | POA: Diagnosis not present

## 2017-06-23 DIAGNOSIS — N2581 Secondary hyperparathyroidism of renal origin: Secondary | ICD-10-CM | POA: Diagnosis not present

## 2017-06-23 DIAGNOSIS — M25561 Pain in right knee: Secondary | ICD-10-CM | POA: Diagnosis not present

## 2017-06-23 DIAGNOSIS — Y999 Unspecified external cause status: Secondary | ICD-10-CM | POA: Diagnosis not present

## 2017-06-23 DIAGNOSIS — M542 Cervicalgia: Secondary | ICD-10-CM | POA: Diagnosis not present

## 2017-06-23 DIAGNOSIS — M25562 Pain in left knee: Secondary | ICD-10-CM | POA: Diagnosis not present

## 2017-06-23 DIAGNOSIS — I129 Hypertensive chronic kidney disease with stage 1 through stage 4 chronic kidney disease, or unspecified chronic kidney disease: Secondary | ICD-10-CM | POA: Diagnosis not present

## 2017-06-23 DIAGNOSIS — I480 Paroxysmal atrial fibrillation: Secondary | ICD-10-CM | POA: Diagnosis not present

## 2017-06-23 DIAGNOSIS — S0003XA Contusion of scalp, initial encounter: Secondary | ICD-10-CM | POA: Diagnosis not present

## 2017-06-23 DIAGNOSIS — C4492 Squamous cell carcinoma of skin, unspecified: Secondary | ICD-10-CM | POA: Diagnosis not present

## 2017-06-23 DIAGNOSIS — N189 Chronic kidney disease, unspecified: Secondary | ICD-10-CM | POA: Diagnosis not present

## 2017-06-23 LAB — CUP PACEART REMOTE DEVICE CHECK
Battery Remaining Longevity: 97 mo
Battery Remaining Percentage: 81 %
Brady Statistic AP VS Percent: 73 %
Brady Statistic AS VS Percent: 23 %
Implantable Lead Implant Date: 20130913
Implantable Lead Implant Date: 20130913
Implantable Lead Location: 753860
Lead Channel Impedance Value: 530 Ohm
Lead Channel Pacing Threshold Amplitude: 0.75 V
Lead Channel Pacing Threshold Amplitude: 1.25 V
Lead Channel Pacing Threshold Pulse Width: 0.5 ms
Lead Channel Sensing Intrinsic Amplitude: 2.4 mV
Lead Channel Setting Pacing Amplitude: 2.5 V
Lead Channel Setting Pacing Pulse Width: 0.5 ms
MDC IDC LEAD LOCATION: 753859
MDC IDC MSMT BATTERY VOLTAGE: 2.93 V
MDC IDC MSMT LEADCHNL RA PACING THRESHOLD PULSEWIDTH: 0.5 ms
MDC IDC MSMT LEADCHNL RV IMPEDANCE VALUE: 580 Ohm
MDC IDC MSMT LEADCHNL RV SENSING INTR AMPL: 11.9 mV
MDC IDC PG IMPLANT DT: 20130913
MDC IDC PG SERIAL: 7382865
MDC IDC SESS DTM: 20190128081526
MDC IDC SET LEADCHNL RA PACING AMPLITUDE: 2 V
MDC IDC SET LEADCHNL RV SENSING SENSITIVITY: 2 mV
MDC IDC STAT BRADY AP VP PERCENT: 4 %
MDC IDC STAT BRADY AS VP PERCENT: 1 %
MDC IDC STAT BRADY RA PERCENT PACED: 77 %
MDC IDC STAT BRADY RV PERCENT PACED: 4.1 %

## 2017-06-23 LAB — CBG MONITORING, ED: Glucose-Capillary: 82 mg/dL (ref 65–99)

## 2017-06-23 MED ORDER — HYDROCODONE-ACETAMINOPHEN 5-325 MG PO TABS
2.0000 | ORAL_TABLET | Freq: Once | ORAL | Status: AC
  Administered 2017-06-23: 2 via ORAL
  Filled 2017-06-23: qty 2

## 2017-06-23 NOTE — Discharge Instructions (Signed)
Thankfully your x-ray showed no signs of brain bleeding, fractures or any fractures of your knees.  You may use Tylenol or your hydrocodone which you currently use for pain, please use an ice pack on your head to help with swelling and to prevent extra bruising.  You may seek medical attention for severe or worsening symptoms.

## 2017-06-23 NOTE — ED Triage Notes (Signed)
Pt reports history of stroke and residual left sided weakness since 2004.  Reports this morning around 1100 she was walking up some stairs, wasn't able to lift her left leg high enough to clear the step, lost her balance, and fell.  Pt has abrasion to left side of forehead and r knee pain.

## 2017-06-23 NOTE — ED Notes (Signed)
Pt wheeled to waiting room. Pt verbalized understanding of discharge instructions.   

## 2017-06-23 NOTE — ED Notes (Signed)
Discussed case with Dr. Thurnell Garbe CT orders given.

## 2017-06-23 NOTE — ED Provider Notes (Signed)
[facility] EMERGENCY DEPARTMENT Provider Note   CSN: [id number] Arrival date & time: 06/23/17  1704     History   Chief Complaint Chief Complaint  Patient presents with  . Fall    HPI [name] is a 77 y.o. female.   Fall   The patient is a pleasant 77 year old female with a known history of atrial fibrillation, history of stroke, currently taking Coumadin who presents after striking her head and her bilateral knees and an accidental fall when she caught her foot walking up the steps.  She reports that from her prior stroke she has left-sided weakness and usually takes the stairs right leg first however in her hurry to get through the stairs in the door at the top of the stairs she lifted left leg first catching a toe and falling forward striking her left forehead on the door jam and on the floor.  There was no loss of consciousness, there was initially no neck pain though some has developed on the left side, there was no injury to the chest where she has her pacemaker, she has no shortness of breath or rib pain or pain with breathing, she has no pain in her bilateral upper extremities but does have pain in the bilateral knees from the injury to the floor.  She had most recently had her Coumadin level checked at 2.8, she takes her daily medications as prescribed including hydrocodone as needed for pain.  This occurred a short time ago, was acute in onset, symptoms are persistent including a mild headache.  No vomiting, no seizures, no changes in mental status.  Past Medical History:  Diagnosis Date  . A-fib ([place])   . Chronic kidney disease   . Colon polyps   . CVA (cerebral vascular accident) ([place])   . Depression   . DM (diabetes mellitus) ([place])   . GERD (gastroesophageal reflux disease)   . HTN (hypertension)   . Hypercholesterolemia   . Hypothyroidism   . Pancreatitis    biliary  . S/P colonoscopy March 2006   Dr. Olevia Perches: per e-chart report: diverticulosis, path  adenomatous polyps  . S/P endoscopy March 2009   multiple 3-4 mmsessile gastric polyps, benign, singl antral erosions, negative H.pylori, reactive gastropathy ? NSAIDs  . 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 Pacemaker-St.Jude 02/09/2012  . Symptomatic bradycardia 02/04/2012  . Atrial fibrillation ([place]) 02/04/2012  . Medial meniscus, posterior horn derangement 11/16/2011  . Osteoarthritis, knee 11/16/2011  . Loose, body, joint, knee 11/16/2011  . Osteoarthritis of left knee 11/04/2011  . Medial meniscus tear 11/04/2011  . Adenomatous polyps 02/19/2011  . Acute ill-defined cerebrovascular disease 04/25/2007  . A-fib ([place]) 04/25/2007  . Clinical depression 04/25/2007  . HLD (hyperlipidemia) 04/25/2007  . Diabetes ([place]) 04/25/2007  . 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 Essential (primary) hypertension 04/25/2007  . Adult hypothyroidism 04/25/2007    Past Surgical History:  Procedure Laterality Date  . CHOLECYSTECTOMY    . COLONOSCOPY  03/10/2011   Procedure: COLONOSCOPY;  Surgeon: Daneil Dolin, MD;  [facility];  Service: Endoscopy;  Laterality: N/A;  12:45  . PACEMAKER INSERTION  02/05/12   SJM Accent DR RF implanted by Dr [name] for symptomatic bradycardia  . PERMANENT PACEMAKER INSERTION N/A 02/05/2012   Procedure: PERMANENT PACEMAKER INSERTION;  Surgeon: Thompson Grayer, MD;  Location: [facility] CATH LAB;  Service: Cardiovascular;  Laterality: N/A;  . SHOULDER SURGERY     left    OB History    No data available       Home Medications    Prior to Admission medications   Medication Sig Start Date End Date Taking? Authorizing Provider  allopurinol (ZYLOPRIM) 100 MG tablet Take 200 mg by mouth daily. 03/15/17   [provider]  atorvastatin (LIPITOR) 40 MG tablet Take 40 mg by mouth daily.      [provider]  calcitRIOL  (ROCALTROL) 0.25 MCG capsule Take 2 tablets by mouth every other day and take 1 tablet daily on the other days    [provider]  Cholecalciferol (VITAMIN D) 2000 units CAPS Take 2,000 Units by mouth daily.    [provider]  citalopram (CELEXA) 20 MG tablet Take 20 mg by mouth daily.      [provider]  DHA-EPA-Flaxseed Oil-Vitamin E CAPS Take 1 capsule by mouth 2 (two) times daily.    [provider]  flecainide (TAMBOCOR) 100 MG tablet Take 100 mg by mouth 2 (two) times daily.      [provider]  fluticasone (FLONASE) 50 MCG/ACT nasal spray Place 2 sprays into the nose daily as needed. For congestion    [provider]  furosemide (LASIX) 80 MG tablet Take 80 mg by mouth 2 (two) times daily.     [provider]  gabapentin (NEURONTIN) 100 MG capsule Take 1 capsule (100 mg total) by mouth 3 (three) times daily. 03/25/15   Carole Civil, MD  glipiZIDE (GLUCOTROL XL) 10 MG 24 hr tablet Take 10 mg by mouth daily.      [provider]  HYDROcodone-acetaminophen (NORCO/VICODIN) 5-325 MG tablet Take 1 tablet by mouth every 6 (six) hours as needed for moderate pain. 09/28/16   Carole Civil, MD  levothyroxine (SYNTHROID, LEVOTHROID) 75 MCG tablet Take 75 mcg by mouth daily.      [provider]  potassium chloride SA (K-DUR,KLOR-CON) 20 MEQ tablet Take 1 tablet by mouth 2 (two) times daily. 01/22/16   [provider]  vitamin B-12 (CYANOCOBALAMIN) 1000 MCG tablet Take 5,000 mcg by mouth daily.    [provider]  warfarin (COUMADIN) 5 MG tablet Take as directed by the coumadin clinic 02/06/12   Hope, Leona Carry, PA-C    Family History Family History  Problem Relation Age of Onset  . Heart disease Unknown   . Arthritis Unknown   . Lung disease Unknown   . Diabetes Unknown   . Kidney disease Unknown   . Colon cancer Neg Hx     Social History Social History   Tobacco Use  . Smoking  status: Never Smoker  . Smokeless tobacco: Never Used  Substance Use Topics  . Alcohol use: No  . Drug use: No     Allergies   Other; Penicillins; and Tetanus toxoids   Review of Systems Review of Systems  All other systems reviewed and are negative.    Physical Exam Updated Vital Signs BP 137/63 (BP Location: Right Arm)   Pulse 70   Temp 97.8 F (36.6 C) (Oral)   Resp 14   Ht 5\' 7"  (1.702 m)   Wt 86.2 kg (190 lb)   SpO2 96%   BMI 29.76 kg/m   Physical Exam  Constitutional: She appears well-developed and well-nourished. No distress.  HENT:  Head: Normocephalic.  Mouth/Throat: Oropharynx is clear and moist. No oropharyngeal exudate.  Small hematoma to the left periorbital area, bilateral knee hematomas.  No hemotympanum, no malocclusion, no battle sign  Eyes: Conjunctivae and EOM are normal. Pupils are equal, round, and reactive to light. Right eye exhibits no discharge. Left eye exhibits no discharge. No scleral icterus.  Neck: Normal range of motion. Neck supple. No JVD present. No thyromegaly present.  There is mild left-sided neck tenderness, there is no posterior spinal tenderness.  Cardiovascular: Normal rate, regular rhythm, normal heart sounds and intact distal pulses. Exam reveals no gallop and no friction rub.  No murmur heard. Pulmonary/Chest: Effort normal and breath sounds normal. No respiratory distress. She has no wheezes. She has no rales.  Abdominal: Soft. Bowel sounds are normal. She exhibits no distension and no mass. There is no tenderness.  Musculoskeletal: Normal range of motion. She exhibits tenderness ( Bruising to the bilateral knees, left medial knee, right anterior knee over the tibial tuberosity). She exhibits no edema or deformity.  Decreased range of motion of the bilateral hips and knees but she states this is baseline secondary to arthritis, bilateral upper extremities with full range of motion  Lymphadenopathy:    She has no cervical  adenopathy.  Neurological: She is alert. Coordination normal.  The patient is able to bilateral straight leg raise without difficulty, she has normal grips bilaterally in the hands, normal sensation of the upper extremities bilaterally, no facial droop, normal speech memory and level of alertness  Skin: Skin is warm and dry. No rash noted. No erythema.  Bruising as per above  Psychiatric: She has a normal mood and affect. Her behavior is normal.  Nursing note and vitals reviewed.    ED Treatments / Results  Labs (all labs ordered are listed, but only abnormal results are displayed) Labs Reviewed  CBG MONITORING, ED     Radiology Ct Head Wo Contrast  Result Date: 06/23/2017 CLINICAL DATA:  Headache, left forehead abrasion and left neck pain following a fall this morning. Residual left-sided weakness from a stroke in 2004. EXAM: CT HEAD WITHOUT CONTRAST CT CERVICAL SPINE WITHOUT CONTRAST TECHNIQUE: Multidetector CT imaging of the head and cervical spine was performed following the standard protocol without intravenous contrast. Multiplanar CT image reconstructions of the cervical spine were also generated. COMPARISON:  Head CT dated 08/27/2016. FINDINGS: CT HEAD FINDINGS Brain: Diffusely enlarged ventricles and subarachnoid spaces. Patchy white matter low density in both cerebral hemispheres. Stable old right middle cerebral artery distribution infarct. No intracranial hemorrhage, mass lesion or CT evidence of acute infarction. Vascular: No hyperdense vessel or unexpected calcification. Skull: Normal. Negative for fracture or focal lesion. Sinuses/Orbits: Unremarkable. Other: Minimal left forehead scalp hematoma. CT CERVICAL SPINE FINDINGS Alignment: Minimal anterolisthesis at the C3-4 level, mild anterolisthesis at the C4-5 level and mild retrolisthesis at the C5-6 level. There is also mild anterolisthesis at the C7-T1 level. Skull base and vertebrae: No acute fracture. No primary bone lesion or  focal pathologic process. Soft tissues and spinal canal: No prevertebral fluid or swelling. No visible canal hematoma. Disc levels: Facet degenerative changes throughout the cervical spine. Moderate disc space narrowing and mild to moderate anterior and moderate posterior spur formation at the C5-6 level. Lesser degenerative changes at the C4-5 and C6-7 levels. Upper chest: Clear lung apices without pneumothorax. Other: Dense bilateral carotid artery calcifications. IMPRESSION: 1. Minimal left frontal scalp hematoma without skull fracture or intracranial hemorrhage. 2. No cervical spine fracture or traumatic subluxation. 3. Stable diffuse cerebral and cerebellar atrophy, chronic small vessel white matter  ischemic changes in both cerebral hemispheres and old right middle cerebral artery distribution infarct. 4. Multilevel cervical spine degenerative changes, including facet degenerative changes throughout the cervical spine. Electronically Signed   By: Claudie Revering M.D.   On: 06/23/2017 18:44   Ct Cervical Spine Wo Contrast  Result Date: 06/23/2017 CLINICAL DATA:  Headache, left forehead abrasion and left neck pain following a fall this morning. Residual left-sided weakness from a stroke in 2004. EXAM: CT HEAD WITHOUT CONTRAST CT CERVICAL SPINE WITHOUT CONTRAST TECHNIQUE: Multidetector CT imaging of the head and cervical spine was performed following the standard protocol without intravenous contrast. Multiplanar CT image reconstructions of the cervical spine were also generated. COMPARISON:  Head CT dated 08/27/2016. FINDINGS: CT HEAD FINDINGS Brain: Diffusely enlarged ventricles and subarachnoid spaces. Patchy white matter low density in both cerebral hemispheres. Stable old right middle cerebral artery distribution infarct. No intracranial hemorrhage, mass lesion or CT evidence of acute infarction. Vascular: No hyperdense vessel or unexpected calcification. Skull: Normal. Negative for fracture or focal lesion.  Sinuses/Orbits: Unremarkable. Other: Minimal left forehead scalp hematoma. CT CERVICAL SPINE FINDINGS Alignment: Minimal anterolisthesis at the C3-4 level, mild anterolisthesis at the C4-5 level and mild retrolisthesis at the C5-6 level. There is also mild anterolisthesis at the C7-T1 level. Skull base and vertebrae: No acute fracture. No primary bone lesion or focal pathologic process. Soft tissues and spinal canal: No prevertebral fluid or swelling. No visible canal hematoma. Disc levels: Facet degenerative changes throughout the cervical spine. Moderate disc space narrowing and mild to moderate anterior and moderate posterior spur formation at the C5-6 level. Lesser degenerative changes at the C4-5 and C6-7 levels. Upper chest: Clear lung apices without pneumothorax. Other: Dense bilateral carotid artery calcifications. IMPRESSION: 1. Minimal left frontal scalp hematoma without skull fracture or intracranial hemorrhage. 2. No cervical spine fracture or traumatic subluxation. 3. Stable diffuse cerebral and cerebellar atrophy, chronic small vessel white matter ischemic changes in both cerebral hemispheres and old right middle cerebral artery distribution infarct. 4. Multilevel cervical spine degenerative changes, including facet degenerative changes throughout the cervical spine. Electronically Signed   By: Claudie Revering M.D.   On: 06/23/2017 18:44   Dg Knee Complete 4 Views Left  Result Date: 06/23/2017 CLINICAL DATA:  Diffuse left knee pain following a fall today. EXAM: LEFT KNEE - COMPLETE 4+ VIEW COMPARISON:  02/04/2015. FINDINGS: Stable tricompartmental degenerative changes and cartilage calcification. No fracture, dislocation or effusion. IMPRESSION: 1. No fracture. 2. 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 Electronically Signed   By: Claudie Revering M.D.   On: 06/23/2017 19:26   Dg Knee Complete 4 Views Right  Result Date: 06/23/2017 CLINICAL DATA:  Fall, knee pain EXAM:  RIGHT KNEE - COMPLETE 4+ VIEW COMPARISON:  05/29/2016 FINDINGS: Moderate to advanced tricompartment osteoarthritic changes within the right knee, most pronounced in the patellofemoral compartment. No acute bony abnormality. Specifically, no fracture, subluxation, or dislocation. No joint effusion. IMPRESSION: Moderate to advanced tricompartment osteoarthritis. No acute bony abnormality. Electronically Signed   By: Rolm Baptise M.D.   On: 06/23/2017 18:31    Procedures Procedures (including critical care time)  Medications Ordered in ED Medications  HYDROcodone-acetaminophen (NORCO/VICODIN) 5-325 MG per tablet 2 tablet (not administered)     Initial Impression / Assessment and Plan / ED Course  I have reviewed the triage vital signs and the nursing notes.  Pertinent labs & imaging results that were available during my care of the patient were reviewed by me and considered in my  medical decision making (see chart for details).     Imaging reviewed showing no signs of CT evidence of hemorrhage on the brain,'s fracture of the skull or the spine of the cervical neck, no injury to the knee, the patient appears well otherwise, requesting some pain medication for her mild headache,   Add left knee Neg for frx Ice Home Pt happy with plan  she appears stable for discharge.  Final Clinical Impressions(s) / ED Diagnoses   Final diagnoses:  Contusion of left periocular region, initial encounter  Contusion of knee and lower leg, initial encounter    ED Discharge Orders    None       Noemi Chapel, MD 06/23/17 1934

## 2017-06-30 DIAGNOSIS — Z6831 Body mass index (BMI) 31.0-31.9, adult: Secondary | ICD-10-CM | POA: Diagnosis not present

## 2017-06-30 DIAGNOSIS — I10 Essential (primary) hypertension: Secondary | ICD-10-CM | POA: Diagnosis not present

## 2017-06-30 DIAGNOSIS — I482 Chronic atrial fibrillation: Secondary | ICD-10-CM | POA: Diagnosis not present

## 2017-06-30 DIAGNOSIS — M1990 Unspecified osteoarthritis, unspecified site: Secondary | ICD-10-CM | POA: Diagnosis not present

## 2017-07-21 DIAGNOSIS — I872 Venous insufficiency (chronic) (peripheral): Secondary | ICD-10-CM | POA: Diagnosis not present

## 2017-07-21 DIAGNOSIS — D0339 Melanoma in situ of other parts of face: Secondary | ICD-10-CM | POA: Diagnosis not present

## 2017-07-28 DIAGNOSIS — I482 Chronic atrial fibrillation: Secondary | ICD-10-CM | POA: Diagnosis not present

## 2017-07-28 DIAGNOSIS — Z7901 Long term (current) use of anticoagulants: Secondary | ICD-10-CM | POA: Diagnosis not present

## 2017-07-28 DIAGNOSIS — I10 Essential (primary) hypertension: Secondary | ICD-10-CM | POA: Diagnosis not present

## 2017-07-28 DIAGNOSIS — E1122 Type 2 diabetes mellitus with diabetic chronic kidney disease: Secondary | ICD-10-CM | POA: Diagnosis not present

## 2017-07-28 DIAGNOSIS — M1710 Unilateral primary osteoarthritis, unspecified knee: Secondary | ICD-10-CM | POA: Diagnosis not present

## 2017-07-28 DIAGNOSIS — N184 Chronic kidney disease, stage 4 (severe): Secondary | ICD-10-CM | POA: Diagnosis not present

## 2017-07-30 DIAGNOSIS — M5136 Other intervertebral disc degeneration, lumbar region: Secondary | ICD-10-CM | POA: Diagnosis not present

## 2017-07-30 DIAGNOSIS — M9903 Segmental and somatic dysfunction of lumbar region: Secondary | ICD-10-CM | POA: Diagnosis not present

## 2017-07-30 DIAGNOSIS — M5030 Other cervical disc degeneration, unspecified cervical region: Secondary | ICD-10-CM | POA: Diagnosis not present

## 2017-07-30 DIAGNOSIS — M9901 Segmental and somatic dysfunction of cervical region: Secondary | ICD-10-CM | POA: Diagnosis not present

## 2017-07-30 DIAGNOSIS — M9902 Segmental and somatic dysfunction of thoracic region: Secondary | ICD-10-CM | POA: Diagnosis not present

## 2017-07-30 DIAGNOSIS — M9904 Segmental and somatic dysfunction of sacral region: Secondary | ICD-10-CM | POA: Diagnosis not present

## 2017-08-05 DIAGNOSIS — D0339 Melanoma in situ of other parts of face: Secondary | ICD-10-CM | POA: Diagnosis not present

## 2017-08-05 DIAGNOSIS — L989 Disorder of the skin and subcutaneous tissue, unspecified: Secondary | ICD-10-CM | POA: Diagnosis not present

## 2017-08-18 DIAGNOSIS — N184 Chronic kidney disease, stage 4 (severe): Secondary | ICD-10-CM | POA: Diagnosis not present

## 2017-08-18 DIAGNOSIS — Z7901 Long term (current) use of anticoagulants: Secondary | ICD-10-CM | POA: Diagnosis not present

## 2017-08-18 DIAGNOSIS — Z6831 Body mass index (BMI) 31.0-31.9, adult: Secondary | ICD-10-CM | POA: Diagnosis not present

## 2017-08-18 DIAGNOSIS — E1122 Type 2 diabetes mellitus with diabetic chronic kidney disease: Secondary | ICD-10-CM | POA: Diagnosis not present

## 2017-08-18 DIAGNOSIS — E782 Mixed hyperlipidemia: Secondary | ICD-10-CM | POA: Diagnosis not present

## 2017-08-18 DIAGNOSIS — E876 Hypokalemia: Secondary | ICD-10-CM | POA: Diagnosis not present

## 2017-08-18 DIAGNOSIS — E039 Hypothyroidism, unspecified: Secondary | ICD-10-CM | POA: Diagnosis not present

## 2017-08-18 DIAGNOSIS — M1710 Unilateral primary osteoarthritis, unspecified knee: Secondary | ICD-10-CM | POA: Diagnosis not present

## 2017-08-18 DIAGNOSIS — M1990 Unspecified osteoarthritis, unspecified site: Secondary | ICD-10-CM | POA: Diagnosis not present

## 2017-08-25 DIAGNOSIS — E039 Hypothyroidism, unspecified: Secondary | ICD-10-CM | POA: Diagnosis not present

## 2017-08-25 DIAGNOSIS — Z7901 Long term (current) use of anticoagulants: Secondary | ICD-10-CM | POA: Diagnosis not present

## 2017-08-25 DIAGNOSIS — E559 Vitamin D deficiency, unspecified: Secondary | ICD-10-CM | POA: Diagnosis not present

## 2017-08-25 DIAGNOSIS — M1710 Unilateral primary osteoarthritis, unspecified knee: Secondary | ICD-10-CM | POA: Diagnosis not present

## 2017-08-25 DIAGNOSIS — E1122 Type 2 diabetes mellitus with diabetic chronic kidney disease: Secondary | ICD-10-CM | POA: Diagnosis not present

## 2017-08-25 DIAGNOSIS — E782 Mixed hyperlipidemia: Secondary | ICD-10-CM | POA: Diagnosis not present

## 2017-08-25 DIAGNOSIS — I10 Essential (primary) hypertension: Secondary | ICD-10-CM | POA: Diagnosis not present

## 2017-08-25 DIAGNOSIS — N184 Chronic kidney disease, stage 4 (severe): Secondary | ICD-10-CM | POA: Diagnosis not present

## 2017-08-25 DIAGNOSIS — I482 Chronic atrial fibrillation: Secondary | ICD-10-CM | POA: Diagnosis not present

## 2017-09-02 DIAGNOSIS — H524 Presbyopia: Secondary | ICD-10-CM | POA: Diagnosis not present

## 2017-09-02 DIAGNOSIS — E119 Type 2 diabetes mellitus without complications: Secondary | ICD-10-CM | POA: Diagnosis not present

## 2017-09-02 DIAGNOSIS — H52223 Regular astigmatism, bilateral: Secondary | ICD-10-CM | POA: Diagnosis not present

## 2017-09-02 DIAGNOSIS — H5201 Hypermetropia, right eye: Secondary | ICD-10-CM | POA: Diagnosis not present

## 2017-09-20 ENCOUNTER — Ambulatory Visit (INDEPENDENT_AMBULATORY_CARE_PROVIDER_SITE_OTHER): Payer: Medicare HMO | Admitting: *Deleted

## 2017-09-20 DIAGNOSIS — I495 Sick sinus syndrome: Secondary | ICD-10-CM

## 2017-09-20 NOTE — Progress Notes (Signed)
Remote pacemaker transmission.   

## 2017-09-21 ENCOUNTER — Encounter: Payer: Self-pay | Admitting: Cardiology

## 2017-09-23 LAB — CUP PACEART REMOTE DEVICE CHECK
Date Time Interrogation Session: 20190502133844
Implantable Lead Implant Date: 20130913
Implantable Lead Implant Date: 20130913
Implantable Lead Location: 753859
Implantable Lead Model: 1948
MDC IDC LEAD LOCATION: 753860
MDC IDC PG IMPLANT DT: 20130913
MDC IDC PG SERIAL: 7382865
Pulse Gen Model: 2210

## 2017-09-29 DIAGNOSIS — I10 Essential (primary) hypertension: Secondary | ICD-10-CM | POA: Diagnosis not present

## 2017-09-29 DIAGNOSIS — M1710 Unilateral primary osteoarthritis, unspecified knee: Secondary | ICD-10-CM | POA: Diagnosis not present

## 2017-09-29 DIAGNOSIS — Z950 Presence of cardiac pacemaker: Secondary | ICD-10-CM | POA: Diagnosis not present

## 2017-09-29 DIAGNOSIS — I482 Chronic atrial fibrillation: Secondary | ICD-10-CM | POA: Diagnosis not present

## 2017-09-29 DIAGNOSIS — E119 Type 2 diabetes mellitus without complications: Secondary | ICD-10-CM | POA: Diagnosis not present

## 2017-10-04 DIAGNOSIS — Z8673 Personal history of transient ischemic attack (TIA), and cerebral infarction without residual deficits: Secondary | ICD-10-CM | POA: Diagnosis not present

## 2017-10-04 DIAGNOSIS — I480 Paroxysmal atrial fibrillation: Secondary | ICD-10-CM | POA: Diagnosis not present

## 2017-10-04 DIAGNOSIS — D631 Anemia in chronic kidney disease: Secondary | ICD-10-CM | POA: Diagnosis not present

## 2017-10-04 DIAGNOSIS — I129 Hypertensive chronic kidney disease with stage 1 through stage 4 chronic kidney disease, or unspecified chronic kidney disease: Secondary | ICD-10-CM | POA: Diagnosis not present

## 2017-10-04 DIAGNOSIS — N184 Chronic kidney disease, stage 4 (severe): Secondary | ICD-10-CM | POA: Diagnosis not present

## 2017-10-04 DIAGNOSIS — Z950 Presence of cardiac pacemaker: Secondary | ICD-10-CM | POA: Diagnosis not present

## 2017-10-04 DIAGNOSIS — N390 Urinary tract infection, site not specified: Secondary | ICD-10-CM | POA: Diagnosis not present

## 2017-10-04 DIAGNOSIS — M109 Gout, unspecified: Secondary | ICD-10-CM | POA: Diagnosis not present

## 2017-10-04 DIAGNOSIS — E1122 Type 2 diabetes mellitus with diabetic chronic kidney disease: Secondary | ICD-10-CM | POA: Diagnosis not present

## 2017-10-04 DIAGNOSIS — N2581 Secondary hyperparathyroidism of renal origin: Secondary | ICD-10-CM | POA: Diagnosis not present

## 2017-10-06 DIAGNOSIS — M1990 Unspecified osteoarthritis, unspecified site: Secondary | ICD-10-CM | POA: Diagnosis not present

## 2017-10-06 DIAGNOSIS — I482 Chronic atrial fibrillation: Secondary | ICD-10-CM | POA: Diagnosis not present

## 2017-10-06 DIAGNOSIS — E039 Hypothyroidism, unspecified: Secondary | ICD-10-CM | POA: Diagnosis not present

## 2017-10-06 DIAGNOSIS — E782 Mixed hyperlipidemia: Secondary | ICD-10-CM | POA: Diagnosis not present

## 2017-10-06 DIAGNOSIS — I10 Essential (primary) hypertension: Secondary | ICD-10-CM | POA: Diagnosis not present

## 2017-10-06 DIAGNOSIS — E1122 Type 2 diabetes mellitus with diabetic chronic kidney disease: Secondary | ICD-10-CM | POA: Diagnosis not present

## 2017-10-06 DIAGNOSIS — E119 Type 2 diabetes mellitus without complications: Secondary | ICD-10-CM | POA: Diagnosis not present

## 2017-10-06 DIAGNOSIS — M1710 Unilateral primary osteoarthritis, unspecified knee: Secondary | ICD-10-CM | POA: Diagnosis not present

## 2017-10-06 DIAGNOSIS — E876 Hypokalemia: Secondary | ICD-10-CM | POA: Diagnosis not present

## 2017-10-15 DIAGNOSIS — M5136 Other intervertebral disc degeneration, lumbar region: Secondary | ICD-10-CM | POA: Diagnosis not present

## 2017-10-15 DIAGNOSIS — M9901 Segmental and somatic dysfunction of cervical region: Secondary | ICD-10-CM | POA: Diagnosis not present

## 2017-10-15 DIAGNOSIS — M9902 Segmental and somatic dysfunction of thoracic region: Secondary | ICD-10-CM | POA: Diagnosis not present

## 2017-10-15 DIAGNOSIS — M5030 Other cervical disc degeneration, unspecified cervical region: Secondary | ICD-10-CM | POA: Diagnosis not present

## 2017-10-15 DIAGNOSIS — M9904 Segmental and somatic dysfunction of sacral region: Secondary | ICD-10-CM | POA: Diagnosis not present

## 2017-10-15 DIAGNOSIS — M9903 Segmental and somatic dysfunction of lumbar region: Secondary | ICD-10-CM | POA: Diagnosis not present

## 2017-10-20 DIAGNOSIS — M9902 Segmental and somatic dysfunction of thoracic region: Secondary | ICD-10-CM | POA: Diagnosis not present

## 2017-10-20 DIAGNOSIS — M5136 Other intervertebral disc degeneration, lumbar region: Secondary | ICD-10-CM | POA: Diagnosis not present

## 2017-10-20 DIAGNOSIS — M9904 Segmental and somatic dysfunction of sacral region: Secondary | ICD-10-CM | POA: Diagnosis not present

## 2017-10-20 DIAGNOSIS — M9903 Segmental and somatic dysfunction of lumbar region: Secondary | ICD-10-CM | POA: Diagnosis not present

## 2017-10-20 DIAGNOSIS — M9901 Segmental and somatic dysfunction of cervical region: Secondary | ICD-10-CM | POA: Diagnosis not present

## 2017-10-20 DIAGNOSIS — M5030 Other cervical disc degeneration, unspecified cervical region: Secondary | ICD-10-CM | POA: Diagnosis not present

## 2017-11-02 DIAGNOSIS — Z1283 Encounter for screening for malignant neoplasm of skin: Secondary | ICD-10-CM | POA: Diagnosis not present

## 2017-11-02 DIAGNOSIS — X32XXXD Exposure to sunlight, subsequent encounter: Secondary | ICD-10-CM | POA: Diagnosis not present

## 2017-11-02 DIAGNOSIS — D225 Melanocytic nevi of trunk: Secondary | ICD-10-CM | POA: Diagnosis not present

## 2017-11-02 DIAGNOSIS — Z08 Encounter for follow-up examination after completed treatment for malignant neoplasm: Secondary | ICD-10-CM | POA: Diagnosis not present

## 2017-11-02 DIAGNOSIS — L570 Actinic keratosis: Secondary | ICD-10-CM | POA: Diagnosis not present

## 2017-11-02 DIAGNOSIS — D485 Neoplasm of uncertain behavior of skin: Secondary | ICD-10-CM | POA: Diagnosis not present

## 2017-11-02 DIAGNOSIS — Z85820 Personal history of malignant melanoma of skin: Secondary | ICD-10-CM | POA: Diagnosis not present

## 2017-11-03 DIAGNOSIS — Z7901 Long term (current) use of anticoagulants: Secondary | ICD-10-CM | POA: Diagnosis not present

## 2017-11-22 DIAGNOSIS — M109 Gout, unspecified: Secondary | ICD-10-CM | POA: Diagnosis not present

## 2017-11-22 DIAGNOSIS — I129 Hypertensive chronic kidney disease with stage 1 through stage 4 chronic kidney disease, or unspecified chronic kidney disease: Secondary | ICD-10-CM | POA: Diagnosis not present

## 2017-11-22 DIAGNOSIS — N2581 Secondary hyperparathyroidism of renal origin: Secondary | ICD-10-CM | POA: Diagnosis not present

## 2017-11-22 DIAGNOSIS — N184 Chronic kidney disease, stage 4 (severe): Secondary | ICD-10-CM | POA: Diagnosis not present

## 2017-11-22 DIAGNOSIS — Z8673 Personal history of transient ischemic attack (TIA), and cerebral infarction without residual deficits: Secondary | ICD-10-CM | POA: Diagnosis not present

## 2017-11-22 DIAGNOSIS — E1122 Type 2 diabetes mellitus with diabetic chronic kidney disease: Secondary | ICD-10-CM | POA: Diagnosis not present

## 2017-11-22 DIAGNOSIS — E782 Mixed hyperlipidemia: Secondary | ICD-10-CM | POA: Diagnosis not present

## 2017-11-22 DIAGNOSIS — I480 Paroxysmal atrial fibrillation: Secondary | ICD-10-CM | POA: Diagnosis not present

## 2017-11-22 DIAGNOSIS — N390 Urinary tract infection, site not specified: Secondary | ICD-10-CM | POA: Diagnosis not present

## 2017-11-22 DIAGNOSIS — D631 Anemia in chronic kidney disease: Secondary | ICD-10-CM | POA: Diagnosis not present

## 2017-11-24 DIAGNOSIS — E876 Hypokalemia: Secondary | ICD-10-CM | POA: Diagnosis not present

## 2017-11-24 DIAGNOSIS — Z950 Presence of cardiac pacemaker: Secondary | ICD-10-CM | POA: Diagnosis not present

## 2017-11-24 DIAGNOSIS — E039 Hypothyroidism, unspecified: Secondary | ICD-10-CM | POA: Diagnosis not present

## 2017-11-24 DIAGNOSIS — E782 Mixed hyperlipidemia: Secondary | ICD-10-CM | POA: Diagnosis not present

## 2017-11-24 DIAGNOSIS — Z7901 Long term (current) use of anticoagulants: Secondary | ICD-10-CM | POA: Diagnosis not present

## 2017-11-24 DIAGNOSIS — M1990 Unspecified osteoarthritis, unspecified site: Secondary | ICD-10-CM | POA: Diagnosis not present

## 2017-11-24 DIAGNOSIS — E119 Type 2 diabetes mellitus without complications: Secondary | ICD-10-CM | POA: Diagnosis not present

## 2017-11-24 DIAGNOSIS — I482 Chronic atrial fibrillation: Secondary | ICD-10-CM | POA: Diagnosis not present

## 2017-11-24 DIAGNOSIS — M1710 Unilateral primary osteoarthritis, unspecified knee: Secondary | ICD-10-CM | POA: Diagnosis not present

## 2017-11-30 ENCOUNTER — Ambulatory Visit (HOSPITAL_COMMUNITY)
Admission: RE | Admit: 2017-11-30 | Discharge: 2017-11-30 | Disposition: A | Discharge status: Home / Self Care | Payer: Medicare HMO | Source: Ambulatory Visit | Attending: Adult Health Nurse Practitioner | Admitting: Adult Health Nurse Practitioner

## 2017-11-30 ENCOUNTER — Other Ambulatory Visit (HOSPITAL_COMMUNITY): Payer: Self-pay | Admitting: Adult Health Nurse Practitioner

## 2017-11-30 DIAGNOSIS — T1490XA Injury, unspecified, initial encounter: Secondary | ICD-10-CM | POA: Diagnosis not present

## 2017-11-30 DIAGNOSIS — M79641 Pain in right hand: Secondary | ICD-10-CM | POA: Diagnosis not present

## 2017-11-30 DIAGNOSIS — Z6831 Body mass index (BMI) 31.0-31.9, adult: Secondary | ICD-10-CM | POA: Diagnosis not present

## 2017-11-30 DIAGNOSIS — W208XXA Other cause of strike by thrown, projected or falling object, initial encounter: Secondary | ICD-10-CM | POA: Insufficient documentation

## 2017-11-30 DIAGNOSIS — S51811A Laceration without foreign body of right forearm, initial encounter: Secondary | ICD-10-CM | POA: Diagnosis not present

## 2017-11-30 DIAGNOSIS — S61411A Laceration without foreign body of right hand, initial encounter: Secondary | ICD-10-CM | POA: Diagnosis not present

## 2017-12-20 ENCOUNTER — Ambulatory Visit (INDEPENDENT_AMBULATORY_CARE_PROVIDER_SITE_OTHER): Payer: Medicare HMO | Admitting: *Deleted

## 2017-12-20 DIAGNOSIS — I495 Sick sinus syndrome: Secondary | ICD-10-CM

## 2017-12-20 NOTE — Progress Notes (Signed)
Remote pacemaker transmission.   

## 2017-12-21 ENCOUNTER — Encounter: Payer: Self-pay | Admitting: Cardiology

## 2017-12-21 DIAGNOSIS — Z952 Presence of prosthetic heart valve: Secondary | ICD-10-CM | POA: Diagnosis not present

## 2017-12-21 DIAGNOSIS — Z7901 Long term (current) use of anticoagulants: Secondary | ICD-10-CM | POA: Diagnosis not present

## 2017-12-28 DIAGNOSIS — I482 Chronic atrial fibrillation: Secondary | ICD-10-CM | POA: Diagnosis not present

## 2017-12-28 DIAGNOSIS — E039 Hypothyroidism, unspecified: Secondary | ICD-10-CM | POA: Diagnosis not present

## 2017-12-28 DIAGNOSIS — N184 Chronic kidney disease, stage 4 (severe): Secondary | ICD-10-CM | POA: Diagnosis not present

## 2017-12-28 DIAGNOSIS — E119 Type 2 diabetes mellitus without complications: Secondary | ICD-10-CM | POA: Diagnosis not present

## 2017-12-28 DIAGNOSIS — M1990 Unspecified osteoarthritis, unspecified site: Secondary | ICD-10-CM | POA: Diagnosis not present

## 2017-12-28 DIAGNOSIS — E559 Vitamin D deficiency, unspecified: Secondary | ICD-10-CM | POA: Diagnosis not present

## 2017-12-28 DIAGNOSIS — E1122 Type 2 diabetes mellitus with diabetic chronic kidney disease: Secondary | ICD-10-CM | POA: Diagnosis not present

## 2017-12-28 DIAGNOSIS — I10 Essential (primary) hypertension: Secondary | ICD-10-CM | POA: Diagnosis not present

## 2017-12-28 DIAGNOSIS — E782 Mixed hyperlipidemia: Secondary | ICD-10-CM | POA: Diagnosis not present

## 2017-12-29 DIAGNOSIS — Z952 Presence of prosthetic heart valve: Secondary | ICD-10-CM | POA: Diagnosis not present

## 2018-01-22 LAB — CUP PACEART REMOTE DEVICE CHECK
Battery Remaining Longevity: 86 mo
Battery Voltage: 2.92 V
Brady Statistic AP VS Percent: 70 %
Brady Statistic AS VP Percent: 1 %
Brady Statistic AS VS Percent: 23 %
Date Time Interrogation Session: 20190729072300
Implantable Lead Implant Date: 20130913
Implantable Lead Location: 753859
Implantable Pulse Generator Implant Date: 20130913
Lead Channel Impedance Value: 490 Ohm
Lead Channel Pacing Threshold Amplitude: 0.875 V
Lead Channel Pacing Threshold Amplitude: 1.25 V
Lead Channel Pacing Threshold Pulse Width: 0.5 ms
Lead Channel Sensing Intrinsic Amplitude: 10.9 mV
Lead Channel Setting Pacing Amplitude: 2 V
Lead Channel Setting Sensing Sensitivity: 2 mV
MDC IDC LEAD IMPLANT DT: 20130913
MDC IDC LEAD LOCATION: 753860
MDC IDC MSMT BATTERY REMAINING PERCENTAGE: 73 %
MDC IDC MSMT LEADCHNL RA PACING THRESHOLD PULSEWIDTH: 0.5 ms
MDC IDC MSMT LEADCHNL RA SENSING INTR AMPL: 2.6 mV
MDC IDC MSMT LEADCHNL RV IMPEDANCE VALUE: 590 Ohm
MDC IDC SET LEADCHNL RV PACING AMPLITUDE: 2.5 V
MDC IDC SET LEADCHNL RV PACING PULSEWIDTH: 0.5 ms
MDC IDC STAT BRADY AP VP PERCENT: 6.7 %
MDC IDC STAT BRADY RA PERCENT PACED: 77 %
MDC IDC STAT BRADY RV PERCENT PACED: 6.8 %
Pulse Gen Model: 2210
Pulse Gen Serial Number: 7382865

## 2018-01-26 DIAGNOSIS — Z952 Presence of prosthetic heart valve: Secondary | ICD-10-CM | POA: Diagnosis not present

## 2018-01-26 DIAGNOSIS — Z7901 Long term (current) use of anticoagulants: Secondary | ICD-10-CM | POA: Diagnosis not present

## 2018-01-28 DIAGNOSIS — Z6841 Body Mass Index (BMI) 40.0 and over, adult: Secondary | ICD-10-CM | POA: Diagnosis not present

## 2018-01-28 DIAGNOSIS — R1032 Left lower quadrant pain: Secondary | ICD-10-CM | POA: Diagnosis not present

## 2018-02-08 DIAGNOSIS — L03114 Cellulitis of left upper limb: Secondary | ICD-10-CM | POA: Diagnosis not present

## 2018-02-08 DIAGNOSIS — Z6841 Body Mass Index (BMI) 40.0 and over, adult: Secondary | ICD-10-CM | POA: Diagnosis not present

## 2018-02-15 ENCOUNTER — Ambulatory Visit (HOSPITAL_COMMUNITY)
Admission: RE | Admit: 2018-02-15 | Discharge: 2018-02-15 | Disposition: A | Discharge status: Home / Self Care | Payer: Medicare HMO | Source: Ambulatory Visit | Attending: Adult Health Nurse Practitioner | Admitting: Adult Health Nurse Practitioner

## 2018-02-15 ENCOUNTER — Other Ambulatory Visit (HOSPITAL_COMMUNITY): Payer: Self-pay | Admitting: Adult Health Nurse Practitioner

## 2018-02-15 DIAGNOSIS — R0781 Pleurodynia: Secondary | ICD-10-CM | POA: Diagnosis not present

## 2018-02-15 DIAGNOSIS — Y838 Other surgical procedures as the cause of abnormal reaction of the patient, or of later complication, without mention of misadventure at the time of the procedure: Secondary | ICD-10-CM | POA: Diagnosis not present

## 2018-02-15 DIAGNOSIS — W19XXXA Unspecified fall, initial encounter: Secondary | ICD-10-CM | POA: Insufficient documentation

## 2018-02-15 DIAGNOSIS — M25512 Pain in left shoulder: Secondary | ICD-10-CM | POA: Diagnosis not present

## 2018-02-15 DIAGNOSIS — T84028A Dislocation of other internal joint prosthesis, initial encounter: Secondary | ICD-10-CM | POA: Insufficient documentation

## 2018-02-15 DIAGNOSIS — Z6830 Body mass index (BMI) 30.0-30.9, adult: Secondary | ICD-10-CM | POA: Diagnosis not present

## 2018-02-15 DIAGNOSIS — S2232XA Fracture of one rib, left side, initial encounter for closed fracture: Secondary | ICD-10-CM | POA: Diagnosis not present

## 2018-02-15 DIAGNOSIS — Z96612 Presence of left artificial shoulder joint: Secondary | ICD-10-CM | POA: Insufficient documentation

## 2018-02-15 DIAGNOSIS — S2232XD Fracture of one rib, left side, subsequent encounter for fracture with routine healing: Secondary | ICD-10-CM | POA: Diagnosis not present

## 2018-02-15 DIAGNOSIS — S40012A Contusion of left shoulder, initial encounter: Secondary | ICD-10-CM | POA: Diagnosis not present

## 2018-02-16 DIAGNOSIS — L570 Actinic keratosis: Secondary | ICD-10-CM | POA: Diagnosis not present

## 2018-02-16 DIAGNOSIS — D225 Melanocytic nevi of trunk: Secondary | ICD-10-CM | POA: Diagnosis not present

## 2018-02-16 DIAGNOSIS — B078 Other viral warts: Secondary | ICD-10-CM | POA: Diagnosis not present

## 2018-02-16 DIAGNOSIS — X32XXXD Exposure to sunlight, subsequent encounter: Secondary | ICD-10-CM | POA: Diagnosis not present

## 2018-02-16 DIAGNOSIS — Z1283 Encounter for screening for malignant neoplasm of skin: Secondary | ICD-10-CM | POA: Diagnosis not present

## 2018-02-16 DIAGNOSIS — Z85820 Personal history of malignant melanoma of skin: Secondary | ICD-10-CM | POA: Diagnosis not present

## 2018-02-16 DIAGNOSIS — Z08 Encounter for follow-up examination after completed treatment for malignant neoplasm: Secondary | ICD-10-CM | POA: Diagnosis not present

## 2018-02-23 ENCOUNTER — Ambulatory Visit: Payer: Medicare HMO | Admitting: Orthopedic Surgery

## 2018-02-23 ENCOUNTER — Encounter: Payer: Self-pay | Admitting: Orthopedic Surgery

## 2018-02-23 ENCOUNTER — Ambulatory Visit (INDEPENDENT_AMBULATORY_CARE_PROVIDER_SITE_OTHER): Payer: Medicare HMO

## 2018-02-23 VITALS — BP 131/70 | HR 72 | Ht 66.0 in | Wt 178.0 lb

## 2018-02-23 DIAGNOSIS — G8929 Other chronic pain: Secondary | ICD-10-CM | POA: Diagnosis not present

## 2018-02-23 DIAGNOSIS — M25561 Pain in right knee: Secondary | ICD-10-CM

## 2018-02-23 DIAGNOSIS — Z0000 Encounter for general adult medical examination without abnormal findings: Secondary | ICD-10-CM | POA: Diagnosis not present

## 2018-02-23 DIAGNOSIS — Z6830 Body mass index (BMI) 30.0-30.9, adult: Secondary | ICD-10-CM | POA: Diagnosis not present

## 2018-02-23 DIAGNOSIS — M25512 Pain in left shoulder: Secondary | ICD-10-CM

## 2018-02-23 DIAGNOSIS — S2232XA Fracture of one rib, left side, initial encounter for closed fracture: Secondary | ICD-10-CM | POA: Diagnosis not present

## 2018-02-23 NOTE — Progress Notes (Signed)
NEW Problem OFFICE VISIT  Chief Complaint  Patient presents with  . Shoulder Pain    left 02/15/18 fall history of shoulder sugery 2857    77 year old female had a left proximal humerus fracture treated with prosthesis at [facility] in 2008 did well presents after falling on 21 September went to the ER on the 24th because of continued pain comes in today complaining of left rib pain decreased ability to take breaths right knee pain and left shoulder pain  She says that the knee is swollen hurts when she weight bears has a knot on it that she is had before seems to go away may be swelling or pain from the fall she has an abrasion there  Left rib fracture noted on x-ray complains of pain when she takes a deep breath  Left shoulder actually not bothering her that much she does have a large bruise over the left acromion     Review of Systems  Constitutional: Negative for chills and fever.  Respiratory: Positive for shortness of breath.   Musculoskeletal: Positive for joint pain.  Neurological: Negative for tingling.     Past Medical History:  Diagnosis Date  . A-fib ([place])   . Chronic kidney disease   . Colon polyps   . CVA (cerebral vascular accident) ([place])   . Depression   . DM (diabetes mellitus) ([place])   . GERD (gastroesophageal reflux disease)   . HTN (hypertension)   . Hypercholesterolemia   . Hypothyroidism   . Pancreatitis    biliary  . S/P colonoscopy March 2006   Dr. Olevia Perches: per e-chart report: diverticulosis, path adenomatous polyps  . S/P endoscopy March 2009   multiple 3-4 mmsessile gastric polyps, benign, singl antral erosions, negative H.pylori, reactive gastropathy ? NSAIDs  . 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 CHOLECYSTECTOMY    . COLONOSCOPY  03/10/2011   Procedure: COLONOSCOPY;  Surgeon: Daneil Dolin, MD;  [facility];  Service: Endoscopy;  Laterality:  N/A;  12:45  . PACEMAKER INSERTION  02/05/12   SJM Accent DR RF implanted by Dr [name] for symptomatic bradycardia  . PERMANENT PACEMAKER INSERTION N/A 02/05/2012   Procedure: PERMANENT PACEMAKER INSERTION;  Surgeon: Thompson Grayer, MD;  Location: [facility] CATH LAB;  Service: Cardiovascular;  Laterality: N/A;  . SHOULDER SURGERY  2008   left shoulder replacement due to fracture     Family History  Problem Relation Age of Onset  . Heart disease Unknown   . Arthritis Unknown   . Lung disease Unknown   . Diabetes Unknown   . Kidney disease Unknown   . Cancer Mother   . Heart disease Father   . Colon cancer Neg Hx    Social History   Tobacco Use  . Smoking status: Never Smoker  . Smokeless tobacco: Never Used  Substance Use Topics  . Alcohol use: No  . Drug use: No    Allergies  Allergen Reactions  . Other Hives    Florinal for headaches.  . Penicillins Rash  . Tetanus Toxoids Rash    Current Meds  Medication Sig  . allopurinol (ZYLOPRIM) 100 MG tablet Take 200 mg by mouth daily.  Marland Kitchen atorvastatin (LIPITOR) 40 MG tablet Take 40 mg by mouth daily.    . calcitRIOL (ROCALTROL) 0.25 MCG capsule Take 2 tablets by mouth every other day and take 1 tablet daily on the other  days  . Cholecalciferol (VITAMIN D) 2000 units CAPS Take 2,000 Units by mouth daily.  . citalopram (CELEXA) 20 MG tablet Take 20 mg by mouth daily.    . DHA-EPA-Flaxseed Oil-Vitamin E CAPS Take 1 capsule by mouth 2 (two) times daily.  . flecainide (TAMBOCOR) 100 MG tablet Take 100 mg by mouth 2 (two) times daily.    . fluticasone (FLONASE) 50 MCG/ACT nasal spray Place 2 sprays into the nose daily as needed. For congestion  . furosemide (LASIX) 80 MG tablet Take 80 mg by mouth 2 (two) times daily.   Marland Kitchen gabapentin (NEURONTIN) 100 MG capsule Take 1 capsule (100 mg total) by mouth 3 (three) times daily.  Marland Kitchen glipiZIDE (GLUCOTROL XL) 10 MG 24 hr tablet Take 10 mg by mouth daily.    Marland Kitchen HYDROcodone-acetaminophen (NORCO/VICODIN)  5-325 MG tablet Take 1 tablet by mouth every 6 (six) hours as needed for moderate pain.  Marland Kitchen levothyroxine (SYNTHROID, LEVOTHROID) 75 MCG tablet Take 75 mcg by mouth daily.    . potassium chloride SA (K-DUR,KLOR-CON) 20 MEQ tablet Take 1 tablet by mouth 2 (two) times daily.  . vitamin B-12 (CYANOCOBALAMIN) 1000 MCG tablet Take 5,000 mcg by mouth daily.  Marland Kitchen warfarin (COUMADIN) 5 MG tablet Take as directed by the coumadin clinic   Current Facility-Administered Medications for the 02/23/18 encounter (Office Visit) with Carole Civil, MD  Medication  . triamcinolone acetonide (KENALOG) 10 MG/ML injection 10 mg    BP 131/70   Pulse 72   Ht 5\' 6"  (1.676 m)   Wt 178 lb (80.7 kg)   BMI 28.73 kg/m   Physical Exam  Constitutional: She is oriented to person, place, and time. She appears well-developed and well-nourished.  Musculoskeletal:       Left shoulder: She exhibits decreased range of motion, pain and decreased strength. She exhibits no swelling, no effusion, no crepitus, no deformity, no laceration and normal pulse.       Right knee: She exhibits effusion and bony tenderness. She exhibits normal range of motion, no deformity, normal alignment, no LCL laxity and no MCL laxity. Tenderness found. Patellar tendon tenderness noted.       Arms: Neurological: She is alert and oriented to person, place, and time.  Psychiatric: She has a normal mood and affect. Judgment normal.  Vitals reviewed.   Right Knee Exam   Other  Effusion: effusion present        MEDICAL DECISION SECTION  Xrays were done at Hospital x-ray ribs report read ninth rib fracture  I reviewed the x-ray of the shoulder myself it included a nice axillary view no dislocation.  I do not see any fracture.  I do see an area which was probably from the surgical procedure itself.  The prosthesis is located well  There is some proximal migration of the humerus but overall looks fine.  Additional images were taken to  evaluate for right knee pain mild arthritis in the lateral compartment moderate arthritis medial compartment arthritis patellofemoral compartment no fracture    Encounter Diagnoses  Name Primary?  . Acute pain of right knee 9.21.19 Yes  . Acute pain of left shoulder   . Closed fracture of one rib of left side, initial encounter, #9     PLAN: (Rx., injectx, surgery, frx, mri/ct) Left shoulder pain acute Left rib fracture Right knee pain acute  Advised to continue hydrocodone for left rib fracture. Progressive return to normal activity left shoulder. Ice as needed right knee.  No  fracture  No orders of the defined types were placed in this encounter.   Arther Abbott, MD  02/23/2018 2:45 PM

## 2018-02-23 NOTE — Patient Instructions (Signed)
continue hydrocodone for left rib fracture. Progressive return to normal activity left shoulder. Ice as needed right knee.

## 2018-03-01 DIAGNOSIS — E1122 Type 2 diabetes mellitus with diabetic chronic kidney disease: Secondary | ICD-10-CM | POA: Diagnosis not present

## 2018-03-01 DIAGNOSIS — I480 Paroxysmal atrial fibrillation: Secondary | ICD-10-CM | POA: Diagnosis not present

## 2018-03-01 DIAGNOSIS — N189 Chronic kidney disease, unspecified: Secondary | ICD-10-CM | POA: Diagnosis not present

## 2018-03-01 DIAGNOSIS — Z950 Presence of cardiac pacemaker: Secondary | ICD-10-CM | POA: Diagnosis not present

## 2018-03-01 DIAGNOSIS — I129 Hypertensive chronic kidney disease with stage 1 through stage 4 chronic kidney disease, or unspecified chronic kidney disease: Secondary | ICD-10-CM | POA: Diagnosis not present

## 2018-03-01 DIAGNOSIS — D631 Anemia in chronic kidney disease: Secondary | ICD-10-CM | POA: Diagnosis not present

## 2018-03-01 DIAGNOSIS — N2581 Secondary hyperparathyroidism of renal origin: Secondary | ICD-10-CM | POA: Diagnosis not present

## 2018-03-01 DIAGNOSIS — E039 Hypothyroidism, unspecified: Secondary | ICD-10-CM | POA: Diagnosis not present

## 2018-03-01 DIAGNOSIS — Z8719 Personal history of other diseases of the digestive system: Secondary | ICD-10-CM | POA: Diagnosis not present

## 2018-03-01 DIAGNOSIS — N184 Chronic kidney disease, stage 4 (severe): Secondary | ICD-10-CM | POA: Diagnosis not present

## 2018-03-21 ENCOUNTER — Ambulatory Visit (INDEPENDENT_AMBULATORY_CARE_PROVIDER_SITE_OTHER): Payer: Medicare HMO | Admitting: *Deleted

## 2018-03-21 DIAGNOSIS — R001 Bradycardia, unspecified: Secondary | ICD-10-CM

## 2018-03-21 DIAGNOSIS — I495 Sick sinus syndrome: Secondary | ICD-10-CM | POA: Diagnosis not present

## 2018-03-22 NOTE — Progress Notes (Signed)
Remote pacemaker transmission.   

## 2018-03-23 ENCOUNTER — Ambulatory Visit: Payer: Medicare HMO | Admitting: Orthopedic Surgery

## 2018-03-31 ENCOUNTER — Encounter: Payer: Medicare HMO | Admitting: Physician Assistant

## 2018-04-04 ENCOUNTER — Ambulatory Visit: Payer: Medicare HMO | Admitting: Orthopedic Surgery

## 2018-04-04 VITALS — BP 140/74 | HR 60 | Ht 66.0 in | Wt 178.0 lb

## 2018-04-04 DIAGNOSIS — M19072 Primary osteoarthritis, left ankle and foot: Secondary | ICD-10-CM | POA: Diagnosis not present

## 2018-04-04 DIAGNOSIS — M25512 Pain in left shoulder: Secondary | ICD-10-CM

## 2018-04-04 DIAGNOSIS — M19079 Primary osteoarthritis, unspecified ankle and foot: Secondary | ICD-10-CM

## 2018-04-04 NOTE — Progress Notes (Signed)
Regular follow-up office visit  Chief Complaint  Patient presents with  . Follow-up    4 week recheck on left shoulder, DOI 02-12-18.    77 year old female with a prosthesis left shoulder fell and had questionable fracture.  After reviewing x-rays it was determined she did not have a fracture but a contusion of her left shoulder she presents for follow-up saying that she has returned basically to bed at baseline but with a new complaint of pain over the dorsum of the foot long-standing chronic dull cramping pain moderate appears to occur when she walks associated with swelling on the dorsum of the foot   Review of Systems  Constitutional: Negative for chills and fever.  Skin: Negative.   Neurological: Negative for tingling and weakness.     Past Medical History:  Diagnosis Date  . A-fib ([place])   . Chronic kidney disease   . Colon polyps   . CVA (cerebral vascular accident) ([place])   . Depression   . DM (diabetes mellitus) ([place])   . GERD (gastroesophageal reflux disease)   . HTN (hypertension)   . Hypercholesterolemia   . Hypothyroidism   . Pancreatitis    biliary  . S/P colonoscopy March 2006   Dr. Olevia Perches: per e-chart report: diverticulosis, path adenomatous polyps  . S/P endoscopy March 2009   multiple 3-4 mmsessile gastric polyps, benign, singl antral erosions, negative H.pylori, reactive gastropathy ? NSAIDs  . 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 CHOLECYSTECTOMY    . COLONOSCOPY  03/10/2011   Procedure: COLONOSCOPY;  Surgeon: Daneil Dolin, MD;  [facility];  Service: Endoscopy;  Laterality: N/A;  12:45  . PACEMAKER INSERTION  02/05/12   SJM Accent DR RF implanted by Dr [name] for symptomatic bradycardia  . PERMANENT PACEMAKER INSERTION N/A 02/05/2012   Procedure: PERMANENT PACEMAKER INSERTION;  Surgeon: Thompson Grayer, MD;  Location: [facility] CATH LAB;  Service: Cardiovascular;   Laterality: N/A;  . SHOULDER SURGERY  2008   left shoulder replacement due to fracture     Family History  Problem Relation Age of Onset  . Heart disease Unknown   . Arthritis Unknown   . Lung disease Unknown   . Diabetes Unknown   . Kidney disease Unknown   . Cancer Mother   . Heart disease Father   . Colon cancer Neg Hx    Social History   Tobacco Use  . Smoking status: Never Smoker  . Smokeless tobacco: Never Used  Substance Use Topics  . Alcohol use: No  . Drug use: No    Allergies  Allergen Reactions  . Other Hives    Florinal for headaches.  . Penicillins Rash  . Tetanus Toxoids Rash    No outpatient medications have been marked as taking for the 04/04/18 encounter (Office Visit) with Carole Civil, MD.   Current Facility-Administered Medications for the 04/04/18 encounter (Office Visit) with Carole Civil, MD  Medication  . triamcinolone acetonide (KENALOG) 10 MG/ML injection 10 mg    BP 140/74   Pulse 60   Ht 5\' 6"  (1.676 m)   Wt 178 lb (80.7 kg)   BMI 28.73 kg/m   Physical Exam  Constitutional: She is oriented to person, place, and time. She appears well-developed and well-nourished.  Neurological: She is alert and oriented to person, place, and time.  Psychiatric: She has a normal mood and affect. Judgment  normal.  Vitals reviewed.   Ortho Exam Left shoulder rotation limited to neutral in terms of external rotation active abduction 45 this is her baseline no tenderness skin is returned to normal weakness in abduction flexion stability tests normal Neurovascular exam   Left foot tenderness on the dorsum of the foot slight swelling skin slightly discolored Normal range of motion at the toes and the ankle No strength deficits in dorsiflexion plantarflexion Ankle stable MTP joint stable Sensation and pulse normal    MEDICAL DECISION SECTION  Xrays were done at No new x-rays were done previous x-ray Prior x-ray report is  included  Encounter Diagnoses  Name Primary?  . Acute pain of left shoulder   . Arthritis of midfoot Yes    PLAN: (Rx., injectx, surgery, frx, mri/ct) Resume normal activity left shoulder  Orthotics left foot  If no improvement after 1 month call the office for referral to foot specialist  No orders of the defined types were placed in this encounter.   Arther Abbott, MD  04/04/2018 11:44 AM

## 2018-04-04 NOTE — Patient Instructions (Signed)
TRY FOOT ORTHOTICS FOR 1 MONTH IF NO IMPROVEMENT CALL us AND WE WILL SET UP APPT WITH FOOT SPECIALIST

## 2018-04-10 NOTE — Progress Notes (Signed)
Cardiology Office Note Date:  04/11/2018  Patient ID:  [name], [name] [birth year], MRN [id number] PCP:  Celene Squibb, MD  Electrophysiologist:  Dr. [name]    Chief Complaint: annual EP/device visit  History of Present Illness: [name] is a 77 y.o. female with history of stroke, DM, HTN, HLD, hypothyroidism, AFib, CKD, symptomatic bradycardia w/PPM.  She comes today to be seen for Dr. [name] last seen by him in Oct 2018, at that time doing well.  Maintained on warfarin for a/c, the patient not interested in [facility].  Planned for annual visits.  She is doing quite well.  Tried to slow down and retire though quickly bored and continues to work.  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   She denies any kind of cardiac awareness, no CP or palpitations, no SOB or DOE, no dizzy spells, near syncope or syncope.  She follows her warfarin with her PMD, no bleeding or signs of bleeding, no issues with INR control   Device information: SJM dual chamber PPM, implanted 02/05/12   Past Medical History:  Diagnosis Date  . A-fib ([place])   . Chronic kidney disease   . Colon polyps   . CVA (cerebral vascular accident) ([place])   . Depression   . DM (diabetes mellitus) ([place])   . GERD (gastroesophageal reflux disease)   . HTN (hypertension)   . Hypercholesterolemia   . Hypothyroidism   . Pancreatitis    biliary  . S/P colonoscopy March 2006   Dr. Olevia Perches: per e-chart report: diverticulosis, path adenomatous polyps  . S/P endoscopy March 2009   multiple 3-4 mmsessile gastric polyps, benign, singl antral erosions, negative H.pylori, reactive gastropathy ? NSAIDs  . 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 CHOLECYSTECTOMY    . COLONOSCOPY  03/10/2011   Procedure: COLONOSCOPY;  Surgeon: Daneil Dolin, MD;  [facility];  Service: Endoscopy;  Laterality: N/A;  12:45   . PACEMAKER INSERTION  02/05/12   SJM Accent DR RF implanted by Dr [name] for symptomatic bradycardia  . PERMANENT PACEMAKER INSERTION N/A 02/05/2012   Procedure: PERMANENT PACEMAKER INSERTION;  Surgeon: Thompson Grayer, MD;  Location: [facility] CATH LAB;  Service: Cardiovascular;  Laterality: N/A;  . SHOULDER SURGERY  2008   left shoulder replacement due to fracture     Current Outpatient Medications  Medication Sig Dispense Refill  . allopurinol (ZYLOPRIM) 100 MG tablet Take 200 mg by mouth daily.    Marland Kitchen atorvastatin (LIPITOR) 40 MG tablet Take 40 mg by mouth daily.      . calcitRIOL (ROCALTROL) 0.25 MCG capsule Take 2 tablets by mouth every other day and take 1 tablet daily on the other days    . Cholecalciferol (VITAMIN D) 2000 units CAPS Take 2,000 Units by mouth daily.    . citalopram (CELEXA) 20 MG tablet Take 20 mg by mouth daily.      . DHA-EPA-Flaxseed Oil-Vitamin E CAPS Take 1 capsule by mouth 2 (two) times daily.    . flecainide (TAMBOCOR) 100 MG tablet Take 100 mg by mouth 2 (two) times daily.      . fluticasone (FLONASE) 50 MCG/ACT nasal spray Place 2 sprays into the nose daily as needed. For congestion    . furosemide (LASIX) 80 MG tablet Take 80 mg by mouth 2 (two) times daily.     Marland Kitchen gabapentin (  NEURONTIN) 100 MG capsule Take 1 capsule (100 mg total) by mouth 3 (three) times daily. 90 capsule 5  . glipiZIDE (GLUCOTROL XL) 10 MG 24 hr tablet Take 10 mg by mouth daily.      Marland Kitchen HYDROcodone-acetaminophen (NORCO/VICODIN) 5-325 MG tablet Take 1 tablet by mouth every 6 (six) hours as needed for moderate pain. 30 tablet 0  . levothyroxine (SYNTHROID, LEVOTHROID) 75 MCG tablet Take 75 mcg by mouth daily.      . potassium chloride SA (K-DUR,KLOR-CON) 20 MEQ tablet Take 1 tablet by mouth 2 (two) times daily.    . vitamin B-12 (CYANOCOBALAMIN) 1000 MCG tablet Take 5,000 mcg by mouth daily.    Marland Kitchen warfarin (COUMADIN) 5 MG tablet Take as directed by the coumadin clinic    . metoprolol succinate (TOPROL  XL) 25 MG 24 hr tablet Take 1 tablet (25 mg total) by mouth daily. 30 tablet 11   Current Facility-Administered Medications  Medication Dose Route Frequency Provider Last Rate Last Dose  . triamcinolone acetonide (KENALOG) 10 MG/ML injection 10 mg  10 mg Other Once Landis Martins, DPM        Allergies:   Other; Penicillins; and Tetanus toxoids   Social History:  The patient  reports that she has never smoked. She has never used smokeless tobacco. She reports that she does not drink alcohol or use drugs.   Family History:  The patient's family history includes Arthritis in her unknown relative; Cancer in her mother; Diabetes in her unknown relative; Heart disease in her father and unknown relative; Kidney disease in her unknown relative; Lung disease in her unknown relative.  ROS:  Please see the history of present illness.    All other systems are reviewed and otherwise negative.   PHYSICAL EXAM:  VS:  BP (!) 144/64   Pulse 66   Ht 5\' 6"  (1.676 m)   Wt 184 lb (83.5 kg)   BMI 29.70 kg/m  BMI: Body mass index is 29.7 kg/m. Well nourished, well developed, in no acute distress  HEENT: normocephalic, atraumatic  Neck: no JVD, carotid bruits or masses Cardiac:  RRR; no significant murmurs, no rubs, or gallops Lungs:  CTA b/l, no wheezing, rhonchi or rales  Abd: soft, nontender MS: no deformity or atrophy Ext: no edema  Skin: warm and dry, no rash Neuro:  No gross deficits appreciated Psych: euthymic mood, full affect  PPM site is stable, no tethering or discomfort   EKG:  Done today and reviewed by myself AP aced, V sensed PPM interrogation done today and reviewed by myself: battery ane lead testing are good, V lead reprogrammed for 2:1 saftey, VP 13%, AP 81%, AMS episodes are true AFlutter, longest 1 hour 22 min. (Dec 2018), on high V rate is 1:1 (Dec 2018)  02/05/12: TTE Study Conclusions - Left ventricle: The cavity size was normal. Wall thickness was normal. Systolic  function was normal. The estimated ejection fraction was in the range of 55% to 60%. Wall motion was normal; there were no regional wall motion abnormalities. - Aortic valve: Mild regurgitation. - Mitral valve: Calcified annulus. Mildly thickened leaflets . Mild regurgitation. - Left atrium: The atrium was mildly dilated. - Pulmonary arteries: Systolic pressure was mildly to moderately increased. Impressions:    Recent Labs: No results found for requested labs within last 8760 hours.  No results found for requested labs within last 8760 hours.   CrCl cannot be calculated (Patient's most recent lab result is older than the maximum  21 days allowed.).   Wt Readings from Last 3 Encounters:  04/11/18 184 lb (83.5 kg)  04/04/18 178 lb (80.7 kg)  02/23/18 178 lb (80.7 kg)     Other studies reviewed: Additional studies/records reviewed today include: summarized above  ASSESSMENT AND PLAN:   1. PPM     Intact function  2. paroxysmal AFib, flutter     CHA2DS2Vasc is 6, on warfarin     On Flecainide, I will add Toprol 25mg  daily  3. HTN     Looks OK, should tolerate low dose BB add on     Disposition: F/u with every 3 month remotes and in-clinic in 1 year, sooner if needed.  Current medicines are reviewed at length with the patient today.  The patient did not have any concerns regarding medicines.  Venetia Night, PA-C 04/11/2018 3:59 PM     [place] [address] [phone] (office)  [phone] (fax)

## 2018-04-11 ENCOUNTER — Ambulatory Visit: Payer: Medicare HMO | Admitting: Physician Assistant

## 2018-04-11 VITALS — BP 144/64 | HR 66 | Ht 66.0 in | Wt 184.0 lb

## 2018-04-11 DIAGNOSIS — I480 Paroxysmal atrial fibrillation: Secondary | ICD-10-CM

## 2018-04-11 DIAGNOSIS — Z950 Presence of cardiac pacemaker: Secondary | ICD-10-CM | POA: Diagnosis not present

## 2018-04-11 DIAGNOSIS — I10 Essential (primary) hypertension: Secondary | ICD-10-CM

## 2018-04-11 MED ORDER — METOPROLOL SUCCINATE ER 25 MG PO TB24: 25.0000 mg | ORAL_TABLET | Freq: Every day | ORAL | 11 refills | Status: DC

## 2018-04-11 NOTE — Patient Instructions (Addendum)
Medication Instructions:   START TAKING TOPROL XL 25 MG ONCE A DAY   If you need a refill on your cardiac medications before your next appointment, please call your pharmacy.   Lab work: NONE ORDERED  TODAY  If you have labs (blood work) drawn today and your tests are completely normal, you will receive your results only by: Marland Kitchen MyChart Message (if you have MyChart) OR . A paper copy in the mail If you have any lab test that is abnormal or we need to change your treatment, we will call you to review the results.  Testing/Procedures: .Marland Kitchen NONE ORDERED  TODAY   Follow-Up: At [facility], you and your health needs are our priority.  As part of our continuing mission to provide you with exceptional heart care, we have created designated Provider Care Teams.  These Care Teams include your primary Cardiologist (physician) and Advanced Practice Providers (APPs -  Physician Assistants and Nurse Practitioners) who all work together to provide you with the care you need, when you need it. You will need a follow up appointment in 12 months.  Please call our office 2 months in advance to schedule this appointment.  You may see  Dr.Allred or one of the following Advanced Practice Providers on your designated Care Team:   Chanetta Marshall, NP . Tommye Standard, PA-C  Remote monitoring is used to monitor your Pacemaker of ICD from home. This monitoring reduces the number of office visits required to check your device to one time per year. It allows [place] to keep an eye on the functioning of your device to ensure it is working properly. You are scheduled for a device check from home on . 06-20-18.Marland KitchenYou may send your transmission at any time that day. If you have a wireless device, the transmission will be sent automatically. After your physician reviews your transmission, you will receive a postcard with your next transmission date.   Any Other Special Instructions Will Be Listed Below (If Applicable).

## 2018-04-27 DIAGNOSIS — M79669 Pain in unspecified lower leg: Secondary | ICD-10-CM | POA: Diagnosis not present

## 2018-04-27 DIAGNOSIS — R600 Localized edema: Secondary | ICD-10-CM | POA: Diagnosis not present

## 2018-05-02 DIAGNOSIS — M10072 Idiopathic gout, left ankle and foot: Secondary | ICD-10-CM | POA: Diagnosis not present

## 2018-05-02 DIAGNOSIS — Z23 Encounter for immunization: Secondary | ICD-10-CM | POA: Diagnosis not present

## 2018-05-09 ENCOUNTER — Other Ambulatory Visit (HOSPITAL_COMMUNITY): Payer: Self-pay | Admitting: Internal Medicine

## 2018-05-09 DIAGNOSIS — Z1231 Encounter for screening mammogram for malignant neoplasm of breast: Secondary | ICD-10-CM

## 2018-05-09 DIAGNOSIS — Z780 Asymptomatic menopausal state: Secondary | ICD-10-CM

## 2018-05-11 DIAGNOSIS — I4821 Permanent atrial fibrillation: Secondary | ICD-10-CM | POA: Diagnosis not present

## 2018-05-11 DIAGNOSIS — Z7901 Long term (current) use of anticoagulants: Secondary | ICD-10-CM | POA: Diagnosis not present

## 2018-05-19 DIAGNOSIS — Z7901 Long term (current) use of anticoagulants: Secondary | ICD-10-CM | POA: Diagnosis not present

## 2018-05-23 LAB — CUP PACEART REMOTE DEVICE CHECK
Battery Remaining Longevity: 83 mo
Brady Statistic AP VP Percent: 13 %
Brady Statistic AP VS Percent: 67 %
Brady Statistic AS VP Percent: 1 %
Brady Statistic AS VS Percent: 19 %
Brady Statistic RV Percent Paced: 13 %
Implantable Lead Implant Date: 20130913
Implantable Lead Location: 753860
Lead Channel Impedance Value: 460 Ohm
Lead Channel Pacing Threshold Amplitude: 1.25 V
Lead Channel Pacing Threshold Pulse Width: 0.5 ms
Lead Channel Sensing Intrinsic Amplitude: 11 mV
Lead Channel Setting Pacing Amplitude: 2 V
Lead Channel Setting Pacing Amplitude: 2.5 V
Lead Channel Setting Pacing Pulse Width: 0.5 ms
Lead Channel Setting Sensing Sensitivity: 2 mV
MDC IDC LEAD IMPLANT DT: 20130913
MDC IDC LEAD LOCATION: 753859
MDC IDC MSMT BATTERY REMAINING PERCENTAGE: 73 %
MDC IDC MSMT BATTERY VOLTAGE: 2.92 V
MDC IDC MSMT LEADCHNL RA PACING THRESHOLD AMPLITUDE: 0.875 V
MDC IDC MSMT LEADCHNL RA PACING THRESHOLD PULSEWIDTH: 0.5 ms
MDC IDC MSMT LEADCHNL RA SENSING INTR AMPL: 2 mV
MDC IDC MSMT LEADCHNL RV IMPEDANCE VALUE: 530 Ohm
MDC IDC PG IMPLANT DT: 20130913
MDC IDC PG SERIAL: 7382865
MDC IDC SESS DTM: 20191028134434
MDC IDC STAT BRADY RA PERCENT PACED: 80 %

## 2018-05-30 DIAGNOSIS — M542 Cervicalgia: Secondary | ICD-10-CM | POA: Diagnosis not present

## 2018-05-30 DIAGNOSIS — G894 Chronic pain syndrome: Secondary | ICD-10-CM | POA: Diagnosis not present

## 2018-05-31 DIAGNOSIS — L820 Inflamed seborrheic keratosis: Secondary | ICD-10-CM | POA: Diagnosis not present

## 2018-05-31 DIAGNOSIS — B078 Other viral warts: Secondary | ICD-10-CM | POA: Diagnosis not present

## 2018-05-31 DIAGNOSIS — Z85820 Personal history of malignant melanoma of skin: Secondary | ICD-10-CM | POA: Diagnosis not present

## 2018-05-31 DIAGNOSIS — X32XXXD Exposure to sunlight, subsequent encounter: Secondary | ICD-10-CM | POA: Diagnosis not present

## 2018-05-31 DIAGNOSIS — Z1283 Encounter for screening for malignant neoplasm of skin: Secondary | ICD-10-CM | POA: Diagnosis not present

## 2018-05-31 DIAGNOSIS — Z08 Encounter for follow-up examination after completed treatment for malignant neoplasm: Secondary | ICD-10-CM | POA: Diagnosis not present

## 2018-05-31 DIAGNOSIS — L570 Actinic keratosis: Secondary | ICD-10-CM | POA: Diagnosis not present

## 2018-06-02 ENCOUNTER — Encounter (HOSPITAL_COMMUNITY): Payer: Self-pay

## 2018-06-02 ENCOUNTER — Inpatient Hospital Stay (HOSPITAL_COMMUNITY): Admission: RE | Admit: 2018-06-02 | Payer: Medicare HMO | Source: Ambulatory Visit

## 2018-06-02 ENCOUNTER — Ambulatory Visit (HOSPITAL_COMMUNITY)
Admission: RE | Admit: 2018-06-02 | Discharge: 2018-06-02 | Disposition: A | Discharge status: Home / Self Care | Payer: Medicare HMO | Source: Ambulatory Visit | Attending: Internal Medicine | Admitting: Internal Medicine

## 2018-06-02 DIAGNOSIS — Z1231 Encounter for screening mammogram for malignant neoplasm of breast: Secondary | ICD-10-CM | POA: Insufficient documentation

## 2018-06-06 ENCOUNTER — Ambulatory Visit (HOSPITAL_COMMUNITY)
Admission: RE | Admit: 2018-06-06 | Discharge: 2018-06-06 | Disposition: A | Discharge status: Home / Self Care | Payer: Medicare HMO | Source: Ambulatory Visit | Attending: Internal Medicine | Admitting: Internal Medicine

## 2018-06-06 DIAGNOSIS — M85851 Other specified disorders of bone density and structure, right thigh: Secondary | ICD-10-CM | POA: Diagnosis not present

## 2018-06-06 DIAGNOSIS — Z780 Asymptomatic menopausal state: Secondary | ICD-10-CM | POA: Diagnosis not present

## 2018-06-20 ENCOUNTER — Ambulatory Visit (INDEPENDENT_AMBULATORY_CARE_PROVIDER_SITE_OTHER): Payer: Medicare HMO

## 2018-06-20 DIAGNOSIS — I495 Sick sinus syndrome: Secondary | ICD-10-CM

## 2018-06-20 DIAGNOSIS — I480 Paroxysmal atrial fibrillation: Secondary | ICD-10-CM

## 2018-06-21 LAB — CUP PACEART REMOTE DEVICE CHECK
Battery Remaining Longevity: 65 mo
Battery Remaining Percentage: 65 %
Brady Statistic AP VP Percent: 8.1 %
Brady Statistic AP VS Percent: 77 %
Brady Statistic AS VP Percent: 1 %
Brady Statistic AS VS Percent: 15 %
Brady Statistic RV Percent Paced: 8.1 %
Implantable Lead Implant Date: 20130913
Implantable Lead Location: 753860
Implantable Lead Model: 1948
Implantable Pulse Generator Implant Date: 20130913
Lead Channel Impedance Value: 530 Ohm
Lead Channel Pacing Threshold Amplitude: 0.75 V
Lead Channel Pacing Threshold Amplitude: 1.5 V
Lead Channel Pacing Threshold Pulse Width: 0.5 ms
Lead Channel Sensing Intrinsic Amplitude: 10.4 mV
Lead Channel Setting Pacing Amplitude: 2 V
Lead Channel Setting Pacing Amplitude: 3 V
Lead Channel Setting Pacing Pulse Width: 0.5 ms
Lead Channel Setting Sensing Sensitivity: 2 mV
MDC IDC LEAD IMPLANT DT: 20130913
MDC IDC LEAD LOCATION: 753859
MDC IDC MSMT BATTERY VOLTAGE: 2.9 V
MDC IDC MSMT LEADCHNL RA IMPEDANCE VALUE: 460 Ohm
MDC IDC MSMT LEADCHNL RA PACING THRESHOLD PULSEWIDTH: 0.5 ms
MDC IDC MSMT LEADCHNL RA SENSING INTR AMPL: 2.7 mV
MDC IDC SESS DTM: 20200127082719
MDC IDC STAT BRADY RA PERCENT PACED: 85 %
Pulse Gen Model: 2210
Pulse Gen Serial Number: 7382865

## 2018-06-21 NOTE — Progress Notes (Signed)
Remote pacemaker transmission.   

## 2018-06-28 DIAGNOSIS — H04123 Dry eye syndrome of bilateral lacrimal glands: Secondary | ICD-10-CM | POA: Diagnosis not present

## 2018-06-28 DIAGNOSIS — H25813 Combined forms of age-related cataract, bilateral: Secondary | ICD-10-CM | POA: Diagnosis not present

## 2018-06-28 DIAGNOSIS — E119 Type 2 diabetes mellitus without complications: Secondary | ICD-10-CM | POA: Diagnosis not present

## 2018-06-28 DIAGNOSIS — H11153 Pinguecula, bilateral: Secondary | ICD-10-CM | POA: Diagnosis not present

## 2018-07-01 DIAGNOSIS — J060 Acute laryngopharyngitis: Secondary | ICD-10-CM | POA: Diagnosis not present

## 2018-07-01 DIAGNOSIS — G501 Atypical facial pain: Secondary | ICD-10-CM | POA: Diagnosis not present

## 2018-07-01 DIAGNOSIS — M542 Cervicalgia: Secondary | ICD-10-CM | POA: Diagnosis not present

## 2018-07-27 DIAGNOSIS — C44722 Squamous cell carcinoma of skin of right lower limb, including hip: Secondary | ICD-10-CM | POA: Diagnosis not present

## 2018-08-02 DIAGNOSIS — E782 Mixed hyperlipidemia: Secondary | ICD-10-CM | POA: Diagnosis not present

## 2018-08-02 DIAGNOSIS — N189 Chronic kidney disease, unspecified: Secondary | ICD-10-CM | POA: Diagnosis not present

## 2018-08-02 DIAGNOSIS — I129 Hypertensive chronic kidney disease with stage 1 through stage 4 chronic kidney disease, or unspecified chronic kidney disease: Secondary | ICD-10-CM | POA: Diagnosis not present

## 2018-08-02 DIAGNOSIS — Z950 Presence of cardiac pacemaker: Secondary | ICD-10-CM | POA: Diagnosis not present

## 2018-08-02 DIAGNOSIS — D631 Anemia in chronic kidney disease: Secondary | ICD-10-CM | POA: Diagnosis not present

## 2018-08-02 DIAGNOSIS — E1122 Type 2 diabetes mellitus with diabetic chronic kidney disease: Secondary | ICD-10-CM | POA: Diagnosis not present

## 2018-08-02 DIAGNOSIS — Z7901 Long term (current) use of anticoagulants: Secondary | ICD-10-CM | POA: Diagnosis not present

## 2018-08-02 DIAGNOSIS — M109 Gout, unspecified: Secondary | ICD-10-CM | POA: Diagnosis not present

## 2018-08-02 DIAGNOSIS — N184 Chronic kidney disease, stage 4 (severe): Secondary | ICD-10-CM | POA: Diagnosis not present

## 2018-08-02 DIAGNOSIS — N2581 Secondary hyperparathyroidism of renal origin: Secondary | ICD-10-CM | POA: Diagnosis not present

## 2018-08-02 DIAGNOSIS — I480 Paroxysmal atrial fibrillation: Secondary | ICD-10-CM | POA: Diagnosis not present

## 2018-08-30 DIAGNOSIS — Z85828 Personal history of other malignant neoplasm of skin: Secondary | ICD-10-CM | POA: Diagnosis not present

## 2018-08-30 DIAGNOSIS — Z08 Encounter for follow-up examination after completed treatment for malignant neoplasm: Secondary | ICD-10-CM | POA: Diagnosis not present

## 2018-09-19 ENCOUNTER — Other Ambulatory Visit: Payer: Self-pay

## 2018-09-19 ENCOUNTER — Ambulatory Visit (INDEPENDENT_AMBULATORY_CARE_PROVIDER_SITE_OTHER): Payer: Medicare HMO | Admitting: *Deleted

## 2018-09-19 DIAGNOSIS — I480 Paroxysmal atrial fibrillation: Secondary | ICD-10-CM | POA: Diagnosis not present

## 2018-09-19 DIAGNOSIS — R001 Bradycardia, unspecified: Secondary | ICD-10-CM

## 2018-09-19 LAB — CUP PACEART REMOTE DEVICE CHECK
Battery Remaining Longevity: 64 mo
Battery Remaining Percentage: 65 %
Battery Voltage: 2.9 V
Brady Statistic AP VP Percent: 21 %
Brady Statistic AP VS Percent: 68 %
Brady Statistic AS VP Percent: 1 %
Brady Statistic AS VS Percent: 11 %
Brady Statistic RA Percent Paced: 89 %
Brady Statistic RV Percent Paced: 21 %
Date Time Interrogation Session: 20200427070024
Implantable Lead Implant Date: 20130913
Implantable Lead Implant Date: 20130913
Implantable Lead Location: 753859
Implantable Lead Location: 753860
Implantable Lead Model: 1948
Implantable Pulse Generator Implant Date: 20130913
Lead Channel Impedance Value: 480 Ohm
Lead Channel Impedance Value: 580 Ohm
Lead Channel Pacing Threshold Amplitude: 0.75 V
Lead Channel Pacing Threshold Amplitude: 1.5 V
Lead Channel Pacing Threshold Pulse Width: 0.5 ms
Lead Channel Pacing Threshold Pulse Width: 0.5 ms
Lead Channel Sensing Intrinsic Amplitude: 10.3 mV
Lead Channel Sensing Intrinsic Amplitude: 2.4 mV
Lead Channel Setting Pacing Amplitude: 2 V
Lead Channel Setting Pacing Amplitude: 3 V
Lead Channel Setting Pacing Pulse Width: 0.5 ms
Lead Channel Setting Sensing Sensitivity: 2 mV
Pulse Gen Model: 2210
Pulse Gen Serial Number: 7382865

## 2018-09-26 NOTE — Progress Notes (Signed)
Remote pacemaker transmission.   

## 2018-10-05 DIAGNOSIS — Z0000 Encounter for general adult medical examination without abnormal findings: Secondary | ICD-10-CM | POA: Diagnosis not present

## 2018-11-18 DIAGNOSIS — Z7901 Long term (current) use of anticoagulants: Secondary | ICD-10-CM | POA: Diagnosis not present

## 2018-11-28 DIAGNOSIS — S40812A Abrasion of left upper arm, initial encounter: Secondary | ICD-10-CM | POA: Diagnosis not present

## 2018-12-13 DIAGNOSIS — B070 Plantar wart: Secondary | ICD-10-CM | POA: Diagnosis not present

## 2018-12-13 DIAGNOSIS — Z85828 Personal history of other malignant neoplasm of skin: Secondary | ICD-10-CM | POA: Diagnosis not present

## 2018-12-13 DIAGNOSIS — Z08 Encounter for follow-up examination after completed treatment for malignant neoplasm: Secondary | ICD-10-CM | POA: Diagnosis not present

## 2018-12-13 DIAGNOSIS — L570 Actinic keratosis: Secondary | ICD-10-CM | POA: Diagnosis not present

## 2018-12-13 DIAGNOSIS — Z1283 Encounter for screening for malignant neoplasm of skin: Secondary | ICD-10-CM | POA: Diagnosis not present

## 2018-12-13 DIAGNOSIS — X32XXXD Exposure to sunlight, subsequent encounter: Secondary | ICD-10-CM | POA: Diagnosis not present

## 2018-12-13 DIAGNOSIS — Z85820 Personal history of malignant melanoma of skin: Secondary | ICD-10-CM | POA: Diagnosis not present

## 2018-12-19 ENCOUNTER — Ambulatory Visit (INDEPENDENT_AMBULATORY_CARE_PROVIDER_SITE_OTHER): Payer: Medicare HMO | Admitting: *Deleted

## 2018-12-19 DIAGNOSIS — I480 Paroxysmal atrial fibrillation: Secondary | ICD-10-CM

## 2018-12-19 DIAGNOSIS — R001 Bradycardia, unspecified: Secondary | ICD-10-CM

## 2018-12-19 LAB — CUP PACEART REMOTE DEVICE CHECK
Battery Remaining Longevity: 64 mo
Battery Remaining Percentage: 65 %
Battery Voltage: 2.9 V
Brady Statistic AP VP Percent: 21 %
Brady Statistic AP VS Percent: 68 %
Brady Statistic AS VP Percent: 1 %
Brady Statistic AS VS Percent: 11 %
Brady Statistic RA Percent Paced: 89 %
Brady Statistic RV Percent Paced: 21 %
Date Time Interrogation Session: 20200727073136
Implantable Lead Implant Date: 20130913
Implantable Lead Implant Date: 20130913
Implantable Lead Location: 753859
Implantable Lead Location: 753860
Implantable Lead Model: 1948
Implantable Pulse Generator Implant Date: 20130913
Lead Channel Impedance Value: 480 Ohm
Lead Channel Impedance Value: 580 Ohm
Lead Channel Pacing Threshold Amplitude: 0.75 V
Lead Channel Pacing Threshold Amplitude: 1.5 V
Lead Channel Pacing Threshold Pulse Width: 0.5 ms
Lead Channel Pacing Threshold Pulse Width: 0.5 ms
Lead Channel Sensing Intrinsic Amplitude: 1.5 mV
Lead Channel Sensing Intrinsic Amplitude: 10.5 mV
Lead Channel Setting Pacing Amplitude: 2 V
Lead Channel Setting Pacing Amplitude: 3 V
Lead Channel Setting Pacing Pulse Width: 0.5 ms
Lead Channel Setting Sensing Sensitivity: 2 mV
Pulse Gen Model: 2210
Pulse Gen Serial Number: 7382865

## 2018-12-21 DIAGNOSIS — M109 Gout, unspecified: Secondary | ICD-10-CM | POA: Diagnosis not present

## 2018-12-21 DIAGNOSIS — I480 Paroxysmal atrial fibrillation: Secondary | ICD-10-CM | POA: Diagnosis not present

## 2018-12-21 DIAGNOSIS — I129 Hypertensive chronic kidney disease with stage 1 through stage 4 chronic kidney disease, or unspecified chronic kidney disease: Secondary | ICD-10-CM | POA: Diagnosis not present

## 2018-12-21 DIAGNOSIS — E1122 Type 2 diabetes mellitus with diabetic chronic kidney disease: Secondary | ICD-10-CM | POA: Diagnosis not present

## 2018-12-21 DIAGNOSIS — D631 Anemia in chronic kidney disease: Secondary | ICD-10-CM | POA: Diagnosis not present

## 2018-12-21 DIAGNOSIS — E782 Mixed hyperlipidemia: Secondary | ICD-10-CM | POA: Diagnosis not present

## 2018-12-21 DIAGNOSIS — N184 Chronic kidney disease, stage 4 (severe): Secondary | ICD-10-CM | POA: Diagnosis not present

## 2018-12-21 DIAGNOSIS — N189 Chronic kidney disease, unspecified: Secondary | ICD-10-CM | POA: Diagnosis not present

## 2018-12-21 DIAGNOSIS — N2581 Secondary hyperparathyroidism of renal origin: Secondary | ICD-10-CM | POA: Diagnosis not present

## 2018-12-29 DIAGNOSIS — M810 Age-related osteoporosis without current pathological fracture: Secondary | ICD-10-CM | POA: Diagnosis not present

## 2018-12-29 DIAGNOSIS — E1142 Type 2 diabetes mellitus with diabetic polyneuropathy: Secondary | ICD-10-CM | POA: Diagnosis not present

## 2018-12-29 DIAGNOSIS — R609 Edema, unspecified: Secondary | ICD-10-CM | POA: Diagnosis not present

## 2018-12-29 DIAGNOSIS — R69 Illness, unspecified: Secondary | ICD-10-CM | POA: Diagnosis not present

## 2018-12-29 DIAGNOSIS — R32 Unspecified urinary incontinence: Secondary | ICD-10-CM | POA: Diagnosis not present

## 2018-12-29 DIAGNOSIS — I4891 Unspecified atrial fibrillation: Secondary | ICD-10-CM | POA: Diagnosis not present

## 2018-12-29 DIAGNOSIS — G8929 Other chronic pain: Secondary | ICD-10-CM | POA: Diagnosis not present

## 2018-12-29 DIAGNOSIS — E039 Hypothyroidism, unspecified: Secondary | ICD-10-CM | POA: Diagnosis not present

## 2018-12-29 DIAGNOSIS — E785 Hyperlipidemia, unspecified: Secondary | ICD-10-CM | POA: Diagnosis not present

## 2018-12-29 DIAGNOSIS — K5900 Constipation, unspecified: Secondary | ICD-10-CM | POA: Diagnosis not present

## 2019-01-02 NOTE — Progress Notes (Signed)
Remote pacemaker transmission.   

## 2019-02-02 DIAGNOSIS — E1122 Type 2 diabetes mellitus with diabetic chronic kidney disease: Secondary | ICD-10-CM | POA: Diagnosis not present

## 2019-02-02 DIAGNOSIS — E119 Type 2 diabetes mellitus without complications: Secondary | ICD-10-CM | POA: Diagnosis not present

## 2019-02-02 DIAGNOSIS — N184 Chronic kidney disease, stage 4 (severe): Secondary | ICD-10-CM | POA: Diagnosis not present

## 2019-02-02 DIAGNOSIS — E039 Hypothyroidism, unspecified: Secondary | ICD-10-CM | POA: Diagnosis not present

## 2019-02-02 DIAGNOSIS — E782 Mixed hyperlipidemia: Secondary | ICD-10-CM | POA: Diagnosis not present

## 2019-02-02 DIAGNOSIS — E559 Vitamin D deficiency, unspecified: Secondary | ICD-10-CM | POA: Diagnosis not present

## 2019-02-02 DIAGNOSIS — I10 Essential (primary) hypertension: Secondary | ICD-10-CM | POA: Diagnosis not present

## 2019-02-03 DIAGNOSIS — M79671 Pain in right foot: Secondary | ICD-10-CM | POA: Diagnosis not present

## 2019-02-03 DIAGNOSIS — E559 Vitamin D deficiency, unspecified: Secondary | ICD-10-CM | POA: Diagnosis not present

## 2019-02-03 DIAGNOSIS — Z6830 Body mass index (BMI) 30.0-30.9, adult: Secondary | ICD-10-CM | POA: Diagnosis not present

## 2019-02-03 DIAGNOSIS — I482 Chronic atrial fibrillation, unspecified: Secondary | ICD-10-CM | POA: Diagnosis not present

## 2019-02-03 DIAGNOSIS — N183 Chronic kidney disease, stage 3 (moderate): Secondary | ICD-10-CM | POA: Diagnosis not present

## 2019-02-03 DIAGNOSIS — I129 Hypertensive chronic kidney disease with stage 1 through stage 4 chronic kidney disease, or unspecified chronic kidney disease: Secondary | ICD-10-CM | POA: Diagnosis not present

## 2019-02-03 DIAGNOSIS — E1122 Type 2 diabetes mellitus with diabetic chronic kidney disease: Secondary | ICD-10-CM | POA: Diagnosis not present

## 2019-02-03 DIAGNOSIS — E669 Obesity, unspecified: Secondary | ICD-10-CM | POA: Diagnosis not present

## 2019-02-03 DIAGNOSIS — Z950 Presence of cardiac pacemaker: Secondary | ICD-10-CM | POA: Diagnosis not present

## 2019-02-10 DIAGNOSIS — E1122 Type 2 diabetes mellitus with diabetic chronic kidney disease: Secondary | ICD-10-CM | POA: Diagnosis not present

## 2019-02-10 DIAGNOSIS — E782 Mixed hyperlipidemia: Secondary | ICD-10-CM | POA: Diagnosis not present

## 2019-02-10 DIAGNOSIS — E039 Hypothyroidism, unspecified: Secondary | ICD-10-CM | POA: Diagnosis not present

## 2019-02-10 DIAGNOSIS — M1990 Unspecified osteoarthritis, unspecified site: Secondary | ICD-10-CM | POA: Diagnosis not present

## 2019-02-10 DIAGNOSIS — I10 Essential (primary) hypertension: Secondary | ICD-10-CM | POA: Diagnosis not present

## 2019-02-10 DIAGNOSIS — N184 Chronic kidney disease, stage 4 (severe): Secondary | ICD-10-CM | POA: Diagnosis not present

## 2019-02-10 DIAGNOSIS — E559 Vitamin D deficiency, unspecified: Secondary | ICD-10-CM | POA: Diagnosis not present

## 2019-02-10 DIAGNOSIS — E119 Type 2 diabetes mellitus without complications: Secondary | ICD-10-CM | POA: Diagnosis not present

## 2019-02-20 DIAGNOSIS — M79671 Pain in right foot: Secondary | ICD-10-CM | POA: Diagnosis not present

## 2019-02-20 DIAGNOSIS — M71371 Other bursal cyst, right ankle and foot: Secondary | ICD-10-CM | POA: Diagnosis not present

## 2019-02-20 DIAGNOSIS — B351 Tinea unguium: Secondary | ICD-10-CM | POA: Diagnosis not present

## 2019-03-03 DIAGNOSIS — M1990 Unspecified osteoarthritis, unspecified site: Secondary | ICD-10-CM | POA: Diagnosis not present

## 2019-03-03 DIAGNOSIS — E782 Mixed hyperlipidemia: Secondary | ICD-10-CM | POA: Diagnosis not present

## 2019-03-03 DIAGNOSIS — N184 Chronic kidney disease, stage 4 (severe): Secondary | ICD-10-CM | POA: Diagnosis not present

## 2019-03-03 DIAGNOSIS — I10 Essential (primary) hypertension: Secondary | ICD-10-CM | POA: Diagnosis not present

## 2019-03-03 DIAGNOSIS — E119 Type 2 diabetes mellitus without complications: Secondary | ICD-10-CM | POA: Diagnosis not present

## 2019-03-03 DIAGNOSIS — E039 Hypothyroidism, unspecified: Secondary | ICD-10-CM | POA: Diagnosis not present

## 2019-03-03 DIAGNOSIS — E1122 Type 2 diabetes mellitus with diabetic chronic kidney disease: Secondary | ICD-10-CM | POA: Diagnosis not present

## 2019-03-03 DIAGNOSIS — E559 Vitamin D deficiency, unspecified: Secondary | ICD-10-CM | POA: Diagnosis not present

## 2019-03-14 DIAGNOSIS — D225 Melanocytic nevi of trunk: Secondary | ICD-10-CM | POA: Diagnosis not present

## 2019-03-14 DIAGNOSIS — X32XXXD Exposure to sunlight, subsequent encounter: Secondary | ICD-10-CM | POA: Diagnosis not present

## 2019-03-14 DIAGNOSIS — Z1283 Encounter for screening for malignant neoplasm of skin: Secondary | ICD-10-CM | POA: Diagnosis not present

## 2019-03-14 DIAGNOSIS — L820 Inflamed seborrheic keratosis: Secondary | ICD-10-CM | POA: Diagnosis not present

## 2019-03-14 DIAGNOSIS — B078 Other viral warts: Secondary | ICD-10-CM | POA: Diagnosis not present

## 2019-03-14 DIAGNOSIS — Z85820 Personal history of malignant melanoma of skin: Secondary | ICD-10-CM | POA: Diagnosis not present

## 2019-03-14 DIAGNOSIS — L570 Actinic keratosis: Secondary | ICD-10-CM | POA: Diagnosis not present

## 2019-03-14 DIAGNOSIS — Z08 Encounter for follow-up examination after completed treatment for malignant neoplasm: Secondary | ICD-10-CM | POA: Diagnosis not present

## 2019-03-21 ENCOUNTER — Ambulatory Visit (INDEPENDENT_AMBULATORY_CARE_PROVIDER_SITE_OTHER): Payer: Medicare HMO | Admitting: *Deleted

## 2019-03-21 DIAGNOSIS — I480 Paroxysmal atrial fibrillation: Secondary | ICD-10-CM

## 2019-03-21 LAB — CUP PACEART REMOTE DEVICE CHECK
Battery Remaining Longevity: 51 mo
Battery Remaining Percentage: 51 %
Battery Voltage: 2.87 V
Brady Statistic AP VP Percent: 21 %
Brady Statistic AP VS Percent: 68 %
Brady Statistic AS VP Percent: 1 %
Brady Statistic AS VS Percent: 11 %
Brady Statistic RA Percent Paced: 89 %
Brady Statistic RV Percent Paced: 21 %
Date Time Interrogation Session: 20201026080130
Implantable Lead Implant Date: 20130913
Implantable Lead Implant Date: 20130913
Implantable Lead Location: 753859
Implantable Lead Location: 753860
Implantable Lead Model: 1948
Implantable Pulse Generator Implant Date: 20130913
Lead Channel Impedance Value: 480 Ohm
Lead Channel Impedance Value: 580 Ohm
Lead Channel Pacing Threshold Amplitude: 0.75 V
Lead Channel Pacing Threshold Amplitude: 1.5 V
Lead Channel Pacing Threshold Pulse Width: 0.5 ms
Lead Channel Pacing Threshold Pulse Width: 0.5 ms
Lead Channel Sensing Intrinsic Amplitude: 12 mV
Lead Channel Sensing Intrinsic Amplitude: 2.6 mV
Lead Channel Setting Pacing Amplitude: 2 V
Lead Channel Setting Pacing Amplitude: 3 V
Lead Channel Setting Pacing Pulse Width: 0.5 ms
Lead Channel Setting Sensing Sensitivity: 2 mV
Pulse Gen Model: 2210
Pulse Gen Serial Number: 7382865

## 2019-04-10 DIAGNOSIS — D631 Anemia in chronic kidney disease: Secondary | ICD-10-CM | POA: Diagnosis not present

## 2019-04-10 DIAGNOSIS — Z8719 Personal history of other diseases of the digestive system: Secondary | ICD-10-CM | POA: Diagnosis not present

## 2019-04-10 DIAGNOSIS — E1122 Type 2 diabetes mellitus with diabetic chronic kidney disease: Secondary | ICD-10-CM | POA: Diagnosis not present

## 2019-04-10 DIAGNOSIS — M109 Gout, unspecified: Secondary | ICD-10-CM | POA: Diagnosis not present

## 2019-04-10 DIAGNOSIS — E782 Mixed hyperlipidemia: Secondary | ICD-10-CM | POA: Diagnosis not present

## 2019-04-10 DIAGNOSIS — I129 Hypertensive chronic kidney disease with stage 1 through stage 4 chronic kidney disease, or unspecified chronic kidney disease: Secondary | ICD-10-CM | POA: Diagnosis not present

## 2019-04-10 DIAGNOSIS — N2581 Secondary hyperparathyroidism of renal origin: Secondary | ICD-10-CM | POA: Diagnosis not present

## 2019-04-10 DIAGNOSIS — N189 Chronic kidney disease, unspecified: Secondary | ICD-10-CM | POA: Diagnosis not present

## 2019-04-10 DIAGNOSIS — Z950 Presence of cardiac pacemaker: Secondary | ICD-10-CM | POA: Diagnosis not present

## 2019-04-10 DIAGNOSIS — N184 Chronic kidney disease, stage 4 (severe): Secondary | ICD-10-CM | POA: Diagnosis not present

## 2019-04-10 DIAGNOSIS — I480 Paroxysmal atrial fibrillation: Secondary | ICD-10-CM | POA: Diagnosis not present

## 2019-04-10 NOTE — Progress Notes (Signed)
Remote pacemaker transmission.   

## 2019-04-11 DIAGNOSIS — I4821 Permanent atrial fibrillation: Secondary | ICD-10-CM | POA: Diagnosis not present

## 2019-04-11 DIAGNOSIS — Z7901 Long term (current) use of anticoagulants: Secondary | ICD-10-CM | POA: Diagnosis not present

## 2019-04-19 DIAGNOSIS — I10 Essential (primary) hypertension: Secondary | ICD-10-CM | POA: Diagnosis not present

## 2019-04-19 DIAGNOSIS — E782 Mixed hyperlipidemia: Secondary | ICD-10-CM | POA: Diagnosis not present

## 2019-04-19 DIAGNOSIS — E1122 Type 2 diabetes mellitus with diabetic chronic kidney disease: Secondary | ICD-10-CM | POA: Diagnosis not present

## 2019-04-19 DIAGNOSIS — E559 Vitamin D deficiency, unspecified: Secondary | ICD-10-CM | POA: Diagnosis not present

## 2019-04-19 DIAGNOSIS — E039 Hypothyroidism, unspecified: Secondary | ICD-10-CM | POA: Diagnosis not present

## 2019-04-19 DIAGNOSIS — M1990 Unspecified osteoarthritis, unspecified site: Secondary | ICD-10-CM | POA: Diagnosis not present

## 2019-04-19 DIAGNOSIS — E119 Type 2 diabetes mellitus without complications: Secondary | ICD-10-CM | POA: Diagnosis not present

## 2019-04-19 DIAGNOSIS — N184 Chronic kidney disease, stage 4 (severe): Secondary | ICD-10-CM | POA: Diagnosis not present

## 2019-04-25 DIAGNOSIS — E119 Type 2 diabetes mellitus without complications: Secondary | ICD-10-CM | POA: Diagnosis not present

## 2019-04-25 DIAGNOSIS — E559 Vitamin D deficiency, unspecified: Secondary | ICD-10-CM | POA: Diagnosis not present

## 2019-04-25 DIAGNOSIS — I129 Hypertensive chronic kidney disease with stage 1 through stage 4 chronic kidney disease, or unspecified chronic kidney disease: Secondary | ICD-10-CM | POA: Diagnosis not present

## 2019-04-25 DIAGNOSIS — E1122 Type 2 diabetes mellitus with diabetic chronic kidney disease: Secondary | ICD-10-CM | POA: Diagnosis not present

## 2019-04-25 DIAGNOSIS — E039 Hypothyroidism, unspecified: Secondary | ICD-10-CM | POA: Diagnosis not present

## 2019-04-25 DIAGNOSIS — N184 Chronic kidney disease, stage 4 (severe): Secondary | ICD-10-CM | POA: Diagnosis not present

## 2019-04-25 DIAGNOSIS — G720 Drug-induced myopathy: Secondary | ICD-10-CM | POA: Diagnosis not present

## 2019-04-25 DIAGNOSIS — M1990 Unspecified osteoarthritis, unspecified site: Secondary | ICD-10-CM | POA: Diagnosis not present

## 2019-04-25 DIAGNOSIS — I10 Essential (primary) hypertension: Secondary | ICD-10-CM | POA: Diagnosis not present

## 2019-04-25 DIAGNOSIS — E782 Mixed hyperlipidemia: Secondary | ICD-10-CM | POA: Diagnosis not present

## 2019-05-01 ENCOUNTER — Ambulatory Visit (INDEPENDENT_AMBULATORY_CARE_PROVIDER_SITE_OTHER): Payer: Medicare HMO | Admitting: Internal Medicine

## 2019-05-01 ENCOUNTER — Other Ambulatory Visit: Payer: Self-pay

## 2019-05-01 VITALS — BP 139/85 | HR 64 | Ht 65.0 in | Wt 185.0 lb

## 2019-05-01 DIAGNOSIS — I10 Essential (primary) hypertension: Secondary | ICD-10-CM | POA: Diagnosis not present

## 2019-05-01 DIAGNOSIS — I480 Paroxysmal atrial fibrillation: Secondary | ICD-10-CM

## 2019-05-01 DIAGNOSIS — R001 Bradycardia, unspecified: Secondary | ICD-10-CM | POA: Diagnosis not present

## 2019-05-01 NOTE — Progress Notes (Signed)
Electrophysiology TeleHealth Note   Due to national recommendations of social distancing due to [place] 19, an audio telehealth visit is felt to be most appropriate for this patient at this time.  Verbal consent was obtained by me for the telehealth visit today.  The patient does not have capability for a virtual visit.  A phone visit is therefore required today.   Date:  05/01/2019   ID:  [name], DOB May 16, 1941, MRN [id number]  Location: patient's home  Provider location:  [facility]  Evaluation Performed: Follow-up visit  PCP:  Celene Squibb, MD   Electrophysiologist:  Dr [name]  Chief Complaint:  palpitations  History of Present Illness:    [name] is a 78 y.o. female who presents via telehealth conferencing today.  Since last being seen in our clinic, the patient reports doing very well.  Today, she denies symptoms of palpitations, chest pain, shortness of breath,  lower extremity edema, dizziness, presyncope, or syncope.  The patient is otherwise without complaint today.  The patient denies symptoms of fevers, chills, cough, or new SOB worrisome for COVID 19.  Past Medical History:  Diagnosis Date  . A-fib ([name])   . Chronic kidney disease   . Colon polyps   . CVA (cerebral vascular accident) ([name])   . Depression   . DM (diabetes mellitus) ([name])   . GERD (gastroesophageal reflux disease)   . HTN (hypertension)   . Hypercholesterolemia   . Hypothyroidism   . Pancreatitis    biliary  . S/P colonoscopy March 2006   Dr. Olevia Perches: per e-chart report: diverticulosis, path adenomatous polyps  . S/P endoscopy March 2009   multiple 3-4 mmsessile gastric polyps, benign, singl antral erosions, negative H.pylori, reactive gastropathy ? NSAIDs  . 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 CHOLECYSTECTOMY    . COLONOSCOPY  03/10/2011   Procedure: COLONOSCOPY;  Surgeon: Daneil Dolin, MD;  [facility];  Service: Endoscopy;  Laterality: N/A;  12:45  . PACEMAKER INSERTION  02/05/12   SJM Accent DR RF implanted by Dr [name] for symptomatic bradycardia  . PERMANENT PACEMAKER INSERTION N/A 02/05/2012   Procedure: PERMANENT PACEMAKER INSERTION;  Surgeon: Thompson Grayer, MD;  Location: [facility] CATH LAB;  Service: Cardiovascular;  Laterality: N/A;  . SHOULDER SURGERY  2008   left shoulder replacement due to fracture     Current Outpatient Medications  Medication Sig Dispense Refill  . allopurinol (ZYLOPRIM) 100 MG tablet Take 200 mg by mouth daily.    Marland Kitchen atorvastatin (LIPITOR) 40 MG tablet Take 20 mg by mouth every other day.     . calcitRIOL (ROCALTROL) 0.25 MCG capsule Take 2 tablets by mouth every other day and take 1 tablet daily on the other days    . Cholecalciferol (VITAMIN D) 2000 units CAPS Take 2,000 Units by mouth daily.    . citalopram (CELEXA) 20 MG tablet Take 20 mg by mouth daily.      . DHA-EPA-Flaxseed Oil-Vitamin E CAPS Take 1 capsule by mouth 2 (two) times daily.    . flecainide (TAMBOCOR) 100 MG tablet Take 100 mg by mouth 2 (two) times daily.      . fluticasone (FLONASE) 50 MCG/ACT nasal spray Place 2 sprays into the nose daily as needed. For congestion    . furosemide (LASIX) 80 MG tablet Take 160 mg by mouth 2 (two)  times daily.     Marland Kitchen gabapentin (NEURONTIN) 100 MG capsule Take 1 capsule (100 mg total) by mouth 3 (three) times daily. 90 capsule 5  . glipiZIDE (GLUCOTROL XL) 10 MG 24 hr tablet Take 10 mg by mouth daily.      Marland Kitchen HYDROcodone-acetaminophen (NORCO/VICODIN) 5-325 MG tablet Take 1 tablet by mouth every 6 (six) hours as needed for moderate pain. 30 tablet 0  . levothyroxine (SYNTHROID, LEVOTHROID) 75 MCG tablet Take 75 mcg by mouth daily.      . potassium chloride SA (K-DUR,KLOR-CON) 20 MEQ tablet Take 1 tablet by mouth as needed.     . vitamin B-12 (CYANOCOBALAMIN) 1000 MCG tablet Take 5,000 mcg by mouth daily.    Marland Kitchen warfarin (COUMADIN) 5  MG tablet Take as directed by the coumadin clinic     Current Facility-Administered Medications  Medication Dose Route Frequency Provider Last Rate Last Dose  . triamcinolone acetonide (KENALOG) 10 MG/ML injection 10 mg  10 mg Other Once Landis Martins, DPM        Allergies:   Other, Penicillins, and Tetanus toxoids   Social History:  The patient  reports that she has never smoked. She has never used smokeless tobacco. She reports that she does not drink alcohol or use drugs.   Family History:  The patient's  family history includes Arthritis in her unknown relative; Cancer in her mother; Diabetes in her unknown relative; Heart disease in her father and unknown relative; Kidney disease in her unknown relative; Lung disease in her unknown relative.   ROS:  Please see the history of present illness.   All other systems are personally reviewed and negative.    Exam:    Vital Signs:  BP 139/85   Pulse 64   Ht 5\' 5"  (1.651 m)   Wt 185 lb (83.9 kg)   BMI 30.79 kg/m   Well sounding, alert and conversant,   Labs/Other Tests and Data Reviewed:    Recent Labs: No results found for requested labs within last 8760 hours.   Wt Readings from Last 3 Encounters:  05/01/19 185 lb (83.9 kg)  04/11/18 184 lb (83.5 kg)  04/04/18 178 lb (80.7 kg)     Last device remote is reviewed from [place] PDF which reveals normal device function, afib is well controlled   ASSESSMENT & PLAN:    1.  Sick sinus syndrome Remotes are up to date Normal pacemaker function  2. afib Well controlled On coumadin chronically  3. HTN Stable No change required today   Follow-up:  Return in a year   Patient Risk:  after full review of this patients clinical status, I feel that they are at moderate risk at this time.  Today, I have spent 15 minutes with the patient with telehealth technology discussing arrhythmia management .    Army Fossa, MD  05/01/2019 1:06 PM     [place] [address] [phone] (office) [phone] (fax)

## 2019-05-02 ENCOUNTER — Other Ambulatory Visit: Payer: Self-pay | Admitting: Internal Medicine

## 2019-05-02 MED ORDER — FUROSEMIDE 80 MG PO TABS: 160.0000 mg | ORAL_TABLET | Freq: Two times a day (BID) | ORAL | 3 refills | Status: DC

## 2019-05-02 MED ORDER — FLECAINIDE ACETATE 100 MG PO TABS: 100.0000 mg | ORAL_TABLET | Freq: Two times a day (BID) | ORAL | 3 refills | Status: DC

## 2019-05-02 NOTE — Telephone Encounter (Signed)
°*  STAT* If patient is at the pharmacy, call can be transferred to refill team.   1. Which medications need to be refilled? (please list name of each medication and dose if known)  flecainide (TAMBOCOR) 100 MG tablet furosemide (LASIX) 80 MG tablet  2. Which pharmacy/location (including street and city if local pharmacy) is medication to be sent to? [facility] - [place], [place] - [place] [address]  3. Do they need a 30 day or 90 day supply? 90   Patient recently changed pharmacies. The pharmacy will need new rx sent in to be filled

## 2019-05-08 DIAGNOSIS — I4821 Permanent atrial fibrillation: Secondary | ICD-10-CM | POA: Diagnosis not present

## 2019-05-08 DIAGNOSIS — Z7901 Long term (current) use of anticoagulants: Secondary | ICD-10-CM | POA: Diagnosis not present

## 2019-05-15 DIAGNOSIS — L820 Inflamed seborrheic keratosis: Secondary | ICD-10-CM | POA: Diagnosis not present

## 2019-05-15 DIAGNOSIS — Z08 Encounter for follow-up examination after completed treatment for malignant neoplasm: Secondary | ICD-10-CM | POA: Diagnosis not present

## 2019-05-15 DIAGNOSIS — Z85820 Personal history of malignant melanoma of skin: Secondary | ICD-10-CM | POA: Diagnosis not present

## 2019-05-31 DIAGNOSIS — E1122 Type 2 diabetes mellitus with diabetic chronic kidney disease: Secondary | ICD-10-CM | POA: Diagnosis not present

## 2019-06-07 DIAGNOSIS — Z7901 Long term (current) use of anticoagulants: Secondary | ICD-10-CM | POA: Diagnosis not present

## 2019-06-07 DIAGNOSIS — I4821 Permanent atrial fibrillation: Secondary | ICD-10-CM | POA: Diagnosis not present

## 2019-06-13 DIAGNOSIS — D225 Melanocytic nevi of trunk: Secondary | ICD-10-CM | POA: Diagnosis not present

## 2019-06-13 DIAGNOSIS — Z85820 Personal history of malignant melanoma of skin: Secondary | ICD-10-CM | POA: Diagnosis not present

## 2019-06-13 DIAGNOSIS — Z08 Encounter for follow-up examination after completed treatment for malignant neoplasm: Secondary | ICD-10-CM | POA: Diagnosis not present

## 2019-06-13 DIAGNOSIS — Z1283 Encounter for screening for malignant neoplasm of skin: Secondary | ICD-10-CM | POA: Diagnosis not present

## 2019-06-20 ENCOUNTER — Ambulatory Visit (INDEPENDENT_AMBULATORY_CARE_PROVIDER_SITE_OTHER): Payer: Medicare HMO | Admitting: *Deleted

## 2019-06-20 DIAGNOSIS — I480 Paroxysmal atrial fibrillation: Secondary | ICD-10-CM

## 2019-06-20 LAB — CUP PACEART REMOTE DEVICE CHECK
Battery Remaining Longevity: 55 mo
Battery Remaining Percentage: 51 %
Battery Voltage: 2.87 V
Brady Statistic AP VP Percent: 20 %
Brady Statistic AP VS Percent: 69 %
Brady Statistic AS VP Percent: 1 %
Brady Statistic AS VS Percent: 12 %
Brady Statistic RA Percent Paced: 88 %
Brady Statistic RV Percent Paced: 20 %
Date Time Interrogation Session: 20210125033301
Implantable Lead Implant Date: 20130913
Implantable Lead Implant Date: 20130913
Implantable Lead Location: 753859
Implantable Lead Location: 753860
Implantable Lead Model: 1948
Implantable Pulse Generator Implant Date: 20130913
Lead Channel Impedance Value: 460 Ohm
Lead Channel Impedance Value: 960 Ohm
Lead Channel Pacing Threshold Amplitude: 0.75 V
Lead Channel Pacing Threshold Amplitude: 1.5 V
Lead Channel Pacing Threshold Pulse Width: 0.5 ms
Lead Channel Pacing Threshold Pulse Width: 0.5 ms
Lead Channel Sensing Intrinsic Amplitude: 12 mV
Lead Channel Sensing Intrinsic Amplitude: 2.7 mV
Lead Channel Setting Pacing Amplitude: 2 V
Lead Channel Setting Pacing Amplitude: 3 V
Lead Channel Setting Pacing Pulse Width: 0.5 ms
Lead Channel Setting Sensing Sensitivity: 2 mV
Pulse Gen Model: 2210
Pulse Gen Serial Number: 7382865

## 2019-07-04 DIAGNOSIS — Z7901 Long term (current) use of anticoagulants: Secondary | ICD-10-CM | POA: Diagnosis not present

## 2019-07-04 DIAGNOSIS — I4821 Permanent atrial fibrillation: Secondary | ICD-10-CM | POA: Diagnosis not present

## 2019-07-13 DIAGNOSIS — E039 Hypothyroidism, unspecified: Secondary | ICD-10-CM | POA: Diagnosis not present

## 2019-07-13 DIAGNOSIS — E559 Vitamin D deficiency, unspecified: Secondary | ICD-10-CM | POA: Diagnosis not present

## 2019-07-13 DIAGNOSIS — E782 Mixed hyperlipidemia: Secondary | ICD-10-CM | POA: Diagnosis not present

## 2019-07-13 DIAGNOSIS — N184 Chronic kidney disease, stage 4 (severe): Secondary | ICD-10-CM | POA: Diagnosis not present

## 2019-07-13 DIAGNOSIS — I10 Essential (primary) hypertension: Secondary | ICD-10-CM | POA: Diagnosis not present

## 2019-07-13 DIAGNOSIS — E119 Type 2 diabetes mellitus without complications: Secondary | ICD-10-CM | POA: Diagnosis not present

## 2019-07-13 DIAGNOSIS — M1990 Unspecified osteoarthritis, unspecified site: Secondary | ICD-10-CM | POA: Diagnosis not present

## 2019-07-13 DIAGNOSIS — E1122 Type 2 diabetes mellitus with diabetic chronic kidney disease: Secondary | ICD-10-CM | POA: Diagnosis not present

## 2019-07-13 DIAGNOSIS — I129 Hypertensive chronic kidney disease with stage 1 through stage 4 chronic kidney disease, or unspecified chronic kidney disease: Secondary | ICD-10-CM | POA: Diagnosis not present

## 2019-07-17 DIAGNOSIS — N183 Chronic kidney disease, stage 3 unspecified: Secondary | ICD-10-CM | POA: Diagnosis not present

## 2019-07-17 DIAGNOSIS — E1122 Type 2 diabetes mellitus with diabetic chronic kidney disease: Secondary | ICD-10-CM | POA: Diagnosis not present

## 2019-07-17 DIAGNOSIS — D631 Anemia in chronic kidney disease: Secondary | ICD-10-CM | POA: Diagnosis not present

## 2019-07-17 DIAGNOSIS — N189 Chronic kidney disease, unspecified: Secondary | ICD-10-CM | POA: Diagnosis not present

## 2019-07-17 DIAGNOSIS — M109 Gout, unspecified: Secondary | ICD-10-CM | POA: Diagnosis not present

## 2019-07-17 DIAGNOSIS — E782 Mixed hyperlipidemia: Secondary | ICD-10-CM | POA: Diagnosis not present

## 2019-07-17 DIAGNOSIS — I480 Paroxysmal atrial fibrillation: Secondary | ICD-10-CM | POA: Diagnosis not present

## 2019-07-17 DIAGNOSIS — I129 Hypertensive chronic kidney disease with stage 1 through stage 4 chronic kidney disease, or unspecified chronic kidney disease: Secondary | ICD-10-CM | POA: Diagnosis not present

## 2019-07-17 DIAGNOSIS — N2581 Secondary hyperparathyroidism of renal origin: Secondary | ICD-10-CM | POA: Diagnosis not present

## 2019-07-17 DIAGNOSIS — Z950 Presence of cardiac pacemaker: Secondary | ICD-10-CM | POA: Diagnosis not present

## 2019-07-17 DIAGNOSIS — E669 Obesity, unspecified: Secondary | ICD-10-CM | POA: Diagnosis not present

## 2019-08-01 DIAGNOSIS — Z7901 Long term (current) use of anticoagulants: Secondary | ICD-10-CM | POA: Diagnosis not present

## 2019-08-01 DIAGNOSIS — I4821 Permanent atrial fibrillation: Secondary | ICD-10-CM | POA: Diagnosis not present

## 2019-08-08 DIAGNOSIS — R0781 Pleurodynia: Secondary | ICD-10-CM | POA: Diagnosis not present

## 2019-08-08 DIAGNOSIS — M1710 Unilateral primary osteoarthritis, unspecified knee: Secondary | ICD-10-CM | POA: Diagnosis not present

## 2019-08-08 DIAGNOSIS — R600 Localized edema: Secondary | ICD-10-CM | POA: Diagnosis not present

## 2019-08-08 DIAGNOSIS — Z950 Presence of cardiac pacemaker: Secondary | ICD-10-CM | POA: Diagnosis not present

## 2019-08-08 DIAGNOSIS — Z712 Person consulting for explanation of examination or test findings: Secondary | ICD-10-CM | POA: Diagnosis not present

## 2019-08-08 DIAGNOSIS — E119 Type 2 diabetes mellitus without complications: Secondary | ICD-10-CM | POA: Diagnosis not present

## 2019-08-08 DIAGNOSIS — M25512 Pain in left shoulder: Secondary | ICD-10-CM | POA: Diagnosis not present

## 2019-08-08 DIAGNOSIS — E669 Obesity, unspecified: Secondary | ICD-10-CM | POA: Diagnosis not present

## 2019-08-08 DIAGNOSIS — Z6830 Body mass index (BMI) 30.0-30.9, adult: Secondary | ICD-10-CM | POA: Diagnosis not present

## 2019-08-08 DIAGNOSIS — M79669 Pain in unspecified lower leg: Secondary | ICD-10-CM | POA: Diagnosis not present

## 2019-08-09 DIAGNOSIS — Z0100 Encounter for examination of eyes and vision without abnormal findings: Secondary | ICD-10-CM | POA: Diagnosis not present

## 2019-08-09 DIAGNOSIS — H5200 Hypermetropia, unspecified eye: Secondary | ICD-10-CM | POA: Diagnosis not present

## 2019-08-11 DIAGNOSIS — N1831 Chronic kidney disease, stage 3a: Secondary | ICD-10-CM | POA: Diagnosis not present

## 2019-08-11 DIAGNOSIS — E039 Hypothyroidism, unspecified: Secondary | ICD-10-CM | POA: Diagnosis not present

## 2019-08-11 DIAGNOSIS — E559 Vitamin D deficiency, unspecified: Secondary | ICD-10-CM | POA: Diagnosis not present

## 2019-08-11 DIAGNOSIS — I129 Hypertensive chronic kidney disease with stage 1 through stage 4 chronic kidney disease, or unspecified chronic kidney disease: Secondary | ICD-10-CM | POA: Diagnosis not present

## 2019-08-11 DIAGNOSIS — E669 Obesity, unspecified: Secondary | ICD-10-CM | POA: Diagnosis not present

## 2019-08-11 DIAGNOSIS — I482 Chronic atrial fibrillation, unspecified: Secondary | ICD-10-CM | POA: Diagnosis not present

## 2019-08-11 DIAGNOSIS — E782 Mixed hyperlipidemia: Secondary | ICD-10-CM | POA: Diagnosis not present

## 2019-08-11 DIAGNOSIS — E1122 Type 2 diabetes mellitus with diabetic chronic kidney disease: Secondary | ICD-10-CM | POA: Diagnosis not present

## 2019-08-11 DIAGNOSIS — Z950 Presence of cardiac pacemaker: Secondary | ICD-10-CM | POA: Diagnosis not present

## 2019-08-11 DIAGNOSIS — Z6830 Body mass index (BMI) 30.0-30.9, adult: Secondary | ICD-10-CM | POA: Diagnosis not present

## 2019-08-29 DIAGNOSIS — Z7901 Long term (current) use of anticoagulants: Secondary | ICD-10-CM | POA: Diagnosis not present

## 2019-08-29 DIAGNOSIS — Z08 Encounter for follow-up examination after completed treatment for malignant neoplasm: Secondary | ICD-10-CM | POA: Diagnosis not present

## 2019-08-29 DIAGNOSIS — Z1283 Encounter for screening for malignant neoplasm of skin: Secondary | ICD-10-CM | POA: Diagnosis not present

## 2019-08-29 DIAGNOSIS — Z85820 Personal history of malignant melanoma of skin: Secondary | ICD-10-CM | POA: Diagnosis not present

## 2019-08-29 DIAGNOSIS — E1122 Type 2 diabetes mellitus with diabetic chronic kidney disease: Secondary | ICD-10-CM | POA: Diagnosis not present

## 2019-08-29 DIAGNOSIS — I4821 Permanent atrial fibrillation: Secondary | ICD-10-CM | POA: Diagnosis not present

## 2019-08-29 DIAGNOSIS — D225 Melanocytic nevi of trunk: Secondary | ICD-10-CM | POA: Diagnosis not present

## 2019-09-04 ENCOUNTER — Other Ambulatory Visit: Payer: Self-pay

## 2019-09-04 ENCOUNTER — Encounter: Payer: Self-pay | Admitting: Orthopedic Surgery

## 2019-09-04 ENCOUNTER — Ambulatory Visit: Payer: Medicare HMO | Admitting: Orthopedic Surgery

## 2019-09-04 ENCOUNTER — Ambulatory Visit: Payer: Medicare HMO

## 2019-09-04 VITALS — BP 132/84 | HR 76 | Temp 97.3°F | Ht 66.0 in | Wt 190.0 lb

## 2019-09-04 DIAGNOSIS — M19049 Primary osteoarthritis, unspecified hand: Secondary | ICD-10-CM

## 2019-09-04 DIAGNOSIS — M19041 Primary osteoarthritis, right hand: Secondary | ICD-10-CM

## 2019-09-04 DIAGNOSIS — M79641 Pain in right hand: Secondary | ICD-10-CM

## 2019-09-04 NOTE — Progress Notes (Signed)
[name]  09/04/2019  Body mass index is 30.67 kg/m.   HISTORY SECTION :  Chief Complaint  Patient presents with  . Hand Pain    Right hand pain.   HPI The patient presents for evaluation of  (mild/moderate/severe/ ) moderate to severe pain in the right thumb near the [facility] joint and around the right wrist for a year worsening in the last month associated with swelling pain,  Prior treatment none   Review of Systems  Constitutional: Negative for fever.  Musculoskeletal: Positive for joint pain.     has a past medical history of A-fib [facility]), Chronic kidney disease, Colon polyps, CVA (cerebral vascular accident) ([place]), Depression, DM (diabetes mellitus) ([place]), GERD (gastroesophageal reflux disease), HTN (hypertension), Hypercholesterolemia, Hypothyroidism, Pancreatitis, S/P colonoscopy (March 2006), S/P endoscopy (March 2009), and Symptomatic bradycardia.   Past Surgical History:  Procedure Laterality Date  . CHOLECYSTECTOMY    . COLONOSCOPY  03/10/2011   Procedure: COLONOSCOPY;  Surgeon: Daneil Dolin, MD;  [facility];  Service: Endoscopy;  Laterality: N/A;  12:45  . PACEMAKER INSERTION  02/05/12   SJM Accent DR RF implanted by Dr [name] for symptomatic bradycardia  . PERMANENT PACEMAKER INSERTION N/A 02/05/2012   Procedure: PERMANENT PACEMAKER INSERTION;  Surgeon: Thompson Grayer, MD;  Location: [facility] CATH LAB;  Service: Cardiovascular;  Laterality: N/A;  . SHOULDER SURGERY  2008   left shoulder replacement due to fracture     Body mass index is 30.67 kg/m.   Allergies  Allergen Reactions  . Other Hives    Florinal for headaches.  . Penicillins Rash  . Tetanus Toxoids Rash     Current Outpatient Medications:  .  allopurinol (ZYLOPRIM) 100 MG tablet, Take 200 mg by mouth daily., Disp: , Rfl:  .  atorvastatin (LIPITOR) 40 MG tablet, Take 20 mg by mouth every other day. , Disp: , Rfl:  .  calcitRIOL (ROCALTROL) 0.25 MCG capsule, Take 2 tablets by mouth  every other day and take 1 tablet daily on the other days, Disp: , Rfl:  .  citalopram (CELEXA) 20 MG tablet, Take 20 mg by mouth daily.  , Disp: , Rfl:  .  DHA-EPA-Flaxseed Oil-Vitamin E CAPS, Take 1 capsule by mouth 2 (two) times daily., Disp: , Rfl:  .  flecainide (TAMBOCOR) 100 MG tablet, Take 1 tablet (100 mg total) by mouth 2 (two) times daily., Disp: 180 tablet, Rfl: 3 .  fluticasone (FLONASE) 50 MCG/ACT nasal spray, Place 2 sprays into the nose daily as needed. For congestion, Disp: , Rfl:  .  furosemide (LASIX) 80 MG tablet, Take 2 tablets (160 mg total) by mouth 2 (two) times daily., Disp: 180 tablet, Rfl: 3 .  gabapentin (NEURONTIN) 100 MG capsule, Take 1 capsule (100 mg total) by mouth 3 (three) times daily., Disp: 90 capsule, Rfl: 5 .  glipiZIDE (GLUCOTROL XL) 10 MG 24 hr tablet, Take 10 mg by mouth daily.  , Disp: , Rfl:  .  HYDROcodone-acetaminophen (NORCO/VICODIN) 5-325 MG tablet, Take 1 tablet by mouth every 6 (six) hours as needed for moderate pain., Disp: 30 tablet, Rfl: 0 .  levothyroxine (SYNTHROID, LEVOTHROID) 75 MCG tablet, Take 75 mcg by mouth daily.  , Disp: , Rfl:  .  potassium chloride SA (K-DUR,KLOR-CON) 20 MEQ tablet, Take 1 tablet by mouth as needed. , Disp: , Rfl:  .  vitamin B-12 (CYANOCOBALAMIN) 1000 MCG tablet, Take 5,000 mcg by mouth daily., Disp: , Rfl:  .  warfarin (COUMADIN) 5  MG tablet, Take as directed by the coumadin clinic, Disp: , Rfl:  .  Cholecalciferol (VITAMIN D) 2000 units CAPS, Take 2,000 Units by mouth daily., Disp: , Rfl:   Current Facility-Administered Medications:  .  triamcinolone acetonide (KENALOG) 10 MG/ML injection 10 mg, 10 mg, Other, Once, Stover, [place], DPM   PHYSICAL EXAM SECTION: 1) BP 132/84   Pulse 76   Temp (!) 97.3 F (36.3 C)   Ht 5\' 6"  (1.676 m)   Wt 190 lb (86.2 kg)   BMI 30.67 kg/m   Body mass index is 30.67 kg/m. General appearance: Well-developed well-nourished no gross deformities  2) Cardiovascular normal  pulse and perfusion , normal color   3) Neurologically deep tendon reflexes are equal and normal, no sensation loss or deficits no pathologic reflexes  4) Psychological: Awake alert and oriented x3 mood and affect normal  5) Skin no lacerations or ulcerations no nodularity no palpable masses, no erythema or nodularity But she has multiple ecchymotic areas which she describes as being caused by working in the yard   6) Musculoskeletal:   Right thumb there is deformity and swelling of the [facility] joint and the scaphoid trapezial scaphoid trapezoid with mild tenderness on the volar side of the wrist.  There is positive grinding or subluxation of the [facility] joint     MEDICAL DECISION MAKING  A.  Encounter Diagnoses  Name Primary?  . Pain in right hand   . CMC arthritis Yes    B. 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 MANAGEMENT  Recommend injection CMC joint Thumb splinting Return in 4 weeks if no improvement I would like her to see the hand specialist because her left upper extremity is noted to have severe shoulder disease and she is status post CVA with left side upper extremity residual weakness and loss of motion.  The right hand is the primary records for her and they will be able to offer her more splinting and therapy options if it comes to that  Follow-up 4 weeks  No orders of the defined types were placed in this encounter.  Procedure note CMC joint injection right thumb  Procedure note injection right thumb  Injection right CMC joint Medication Depo-Medrol 40 mg and lidocaine 1% 2 cc The patient gave verbal consent Timeout confirmed the site of injection right CMC joint  Alcohol and ethyl chloride was used to repair the skin and then a 25-gauge  needle was used to inject the [facility] joint of the right thumb  There were no complications a sterile Band-Aid was applied   Arther Abbott, MD  09/04/2019 10:47 AM

## 2019-09-19 ENCOUNTER — Ambulatory Visit (INDEPENDENT_AMBULATORY_CARE_PROVIDER_SITE_OTHER): Payer: Medicare HMO | Admitting: *Deleted

## 2019-09-19 DIAGNOSIS — I480 Paroxysmal atrial fibrillation: Secondary | ICD-10-CM | POA: Diagnosis not present

## 2019-09-19 LAB — CUP PACEART REMOTE DEVICE CHECK
Battery Remaining Longevity: 52 mo
Battery Remaining Percentage: 46 %
Battery Voltage: 2.86 V
Brady Statistic AP VP Percent: 20 %
Brady Statistic AP VS Percent: 69 %
Brady Statistic AS VP Percent: 1 %
Brady Statistic AS VS Percent: 11 %
Brady Statistic RA Percent Paced: 89 %
Brady Statistic RV Percent Paced: 20 %
Date Time Interrogation Session: 20210427050839
Implantable Lead Implant Date: 20130913
Implantable Lead Implant Date: 20130913
Implantable Lead Location: 753859
Implantable Lead Location: 753860
Implantable Lead Model: 1948
Implantable Pulse Generator Implant Date: 20130913
Lead Channel Impedance Value: 1200 Ohm
Lead Channel Impedance Value: 530 Ohm
Lead Channel Pacing Threshold Amplitude: 0.75 V
Lead Channel Pacing Threshold Amplitude: 1.5 V
Lead Channel Pacing Threshold Pulse Width: 0.5 ms
Lead Channel Pacing Threshold Pulse Width: 0.5 ms
Lead Channel Sensing Intrinsic Amplitude: 12 mV
Lead Channel Sensing Intrinsic Amplitude: 3 mV
Lead Channel Setting Pacing Amplitude: 2 V
Lead Channel Setting Pacing Amplitude: 3 V
Lead Channel Setting Pacing Pulse Width: 0.5 ms
Lead Channel Setting Sensing Sensitivity: 2 mV
Pulse Gen Model: 2210
Pulse Gen Serial Number: 7382865

## 2019-09-19 NOTE — Progress Notes (Signed)
PPM Remote  

## 2019-09-21 ENCOUNTER — Encounter: Payer: Self-pay | Admitting: Orthopedic Surgery

## 2019-09-26 DIAGNOSIS — I4821 Permanent atrial fibrillation: Secondary | ICD-10-CM | POA: Diagnosis not present

## 2019-09-26 DIAGNOSIS — Z7901 Long term (current) use of anticoagulants: Secondary | ICD-10-CM | POA: Diagnosis not present

## 2019-09-27 IMAGING — MG DIGITAL SCREENING BILATERAL MAMMOGRAM WITH TOMO AND CAD
6 of 12 series · 6 of 36 positions shown · non-contrast
Comparison: Previous exam(s).

CLINICAL DATA: Screening.

EXAM:
DIGITAL SCREENING BILATERAL MAMMOGRAM WITH TOMO AND CAD

[L CC synth-2D]
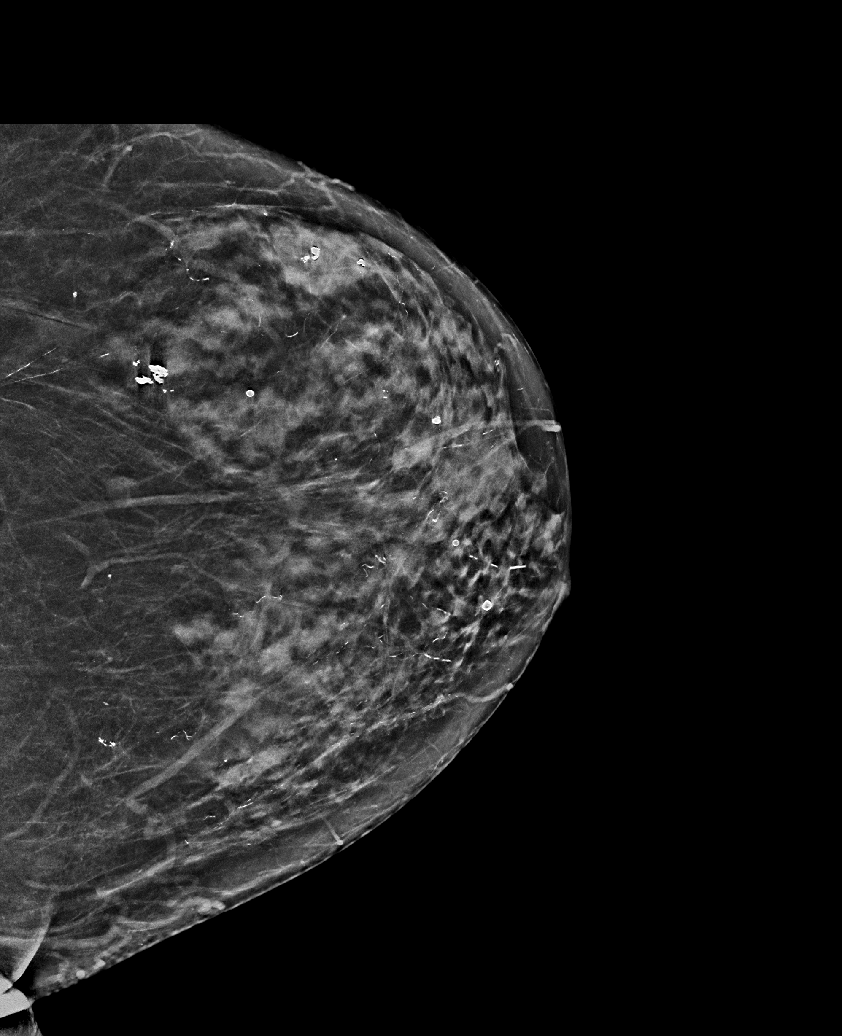

[R CC synth-2D]
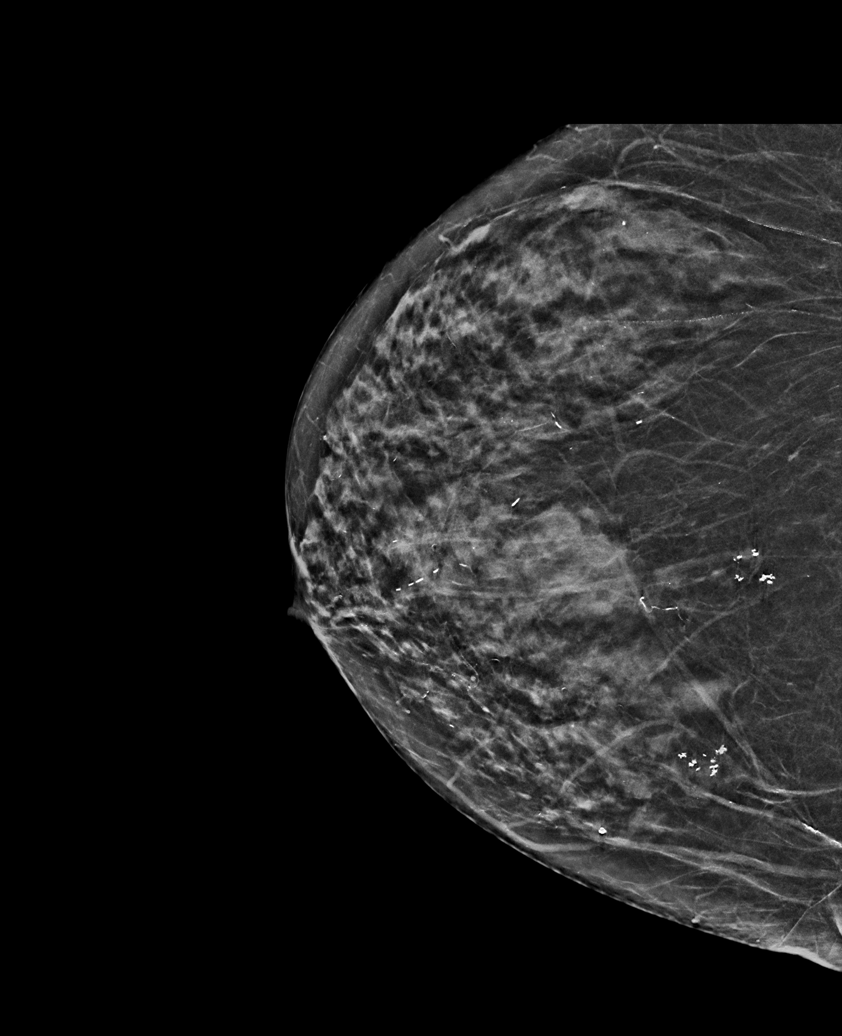

[R MLO synth-2D (1 of 2)]
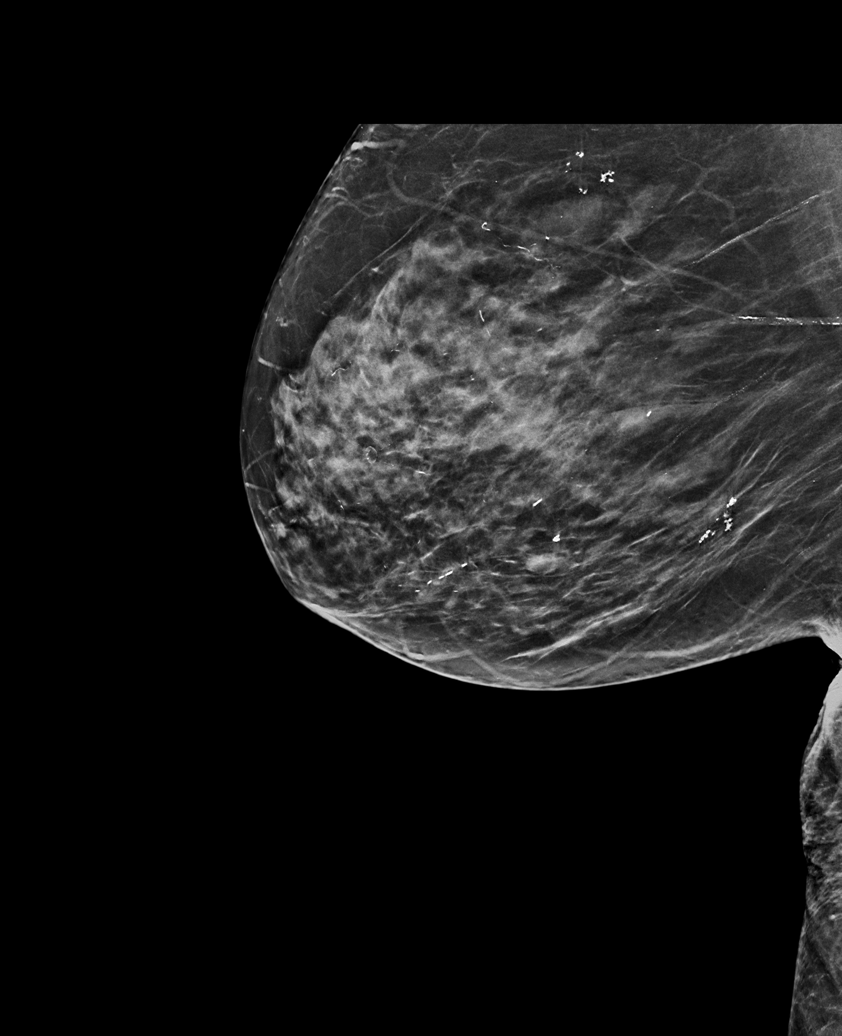

[R MLO synth-2D (2 of 2)]
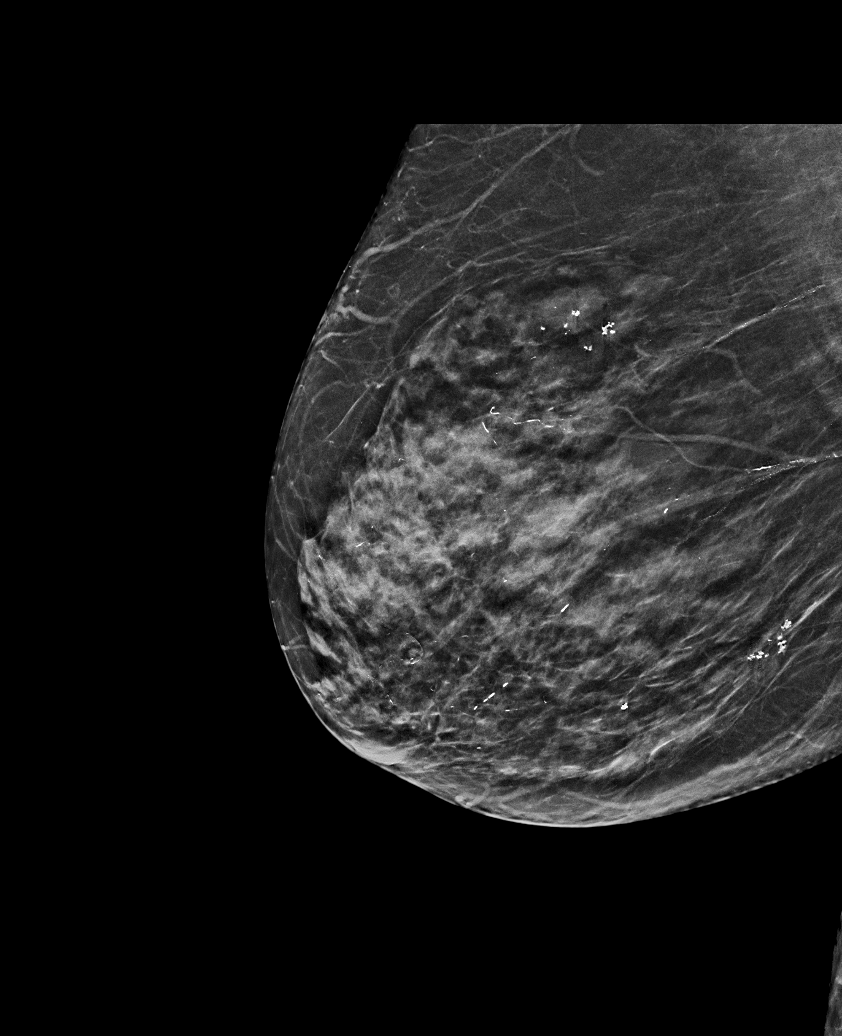

[L MLO synth-2D (1 of 2)]
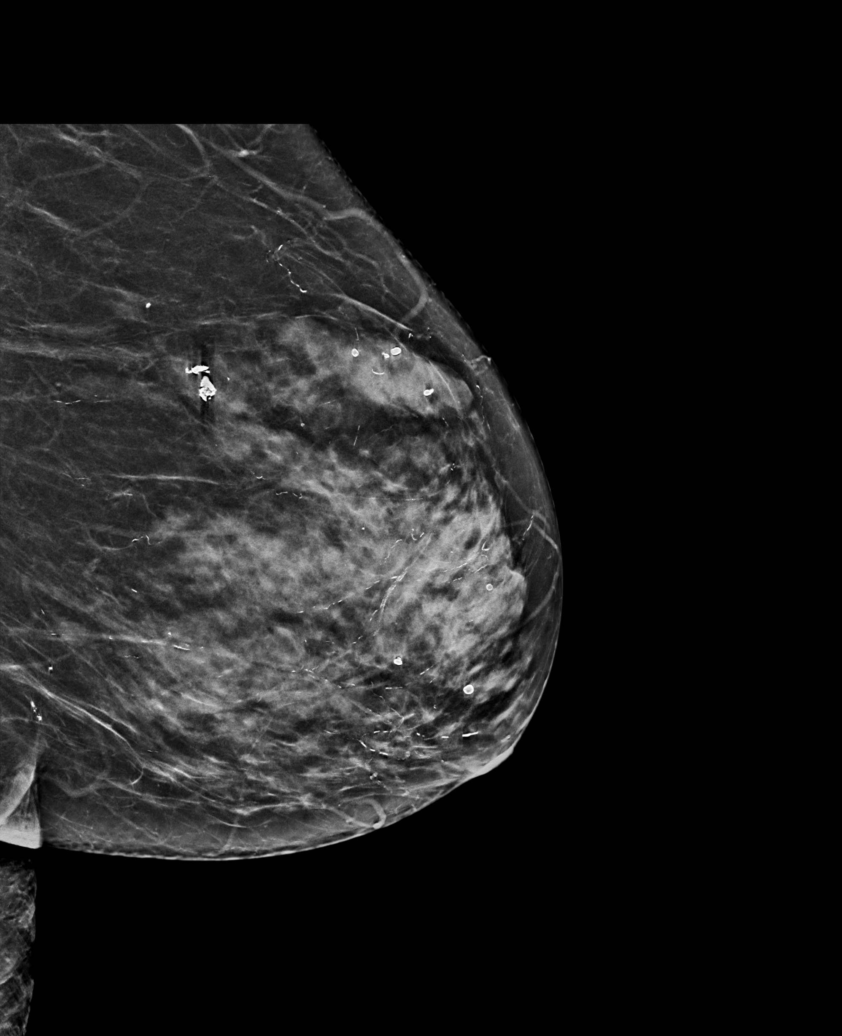

[L MLO synth-2D (2 of 2)]
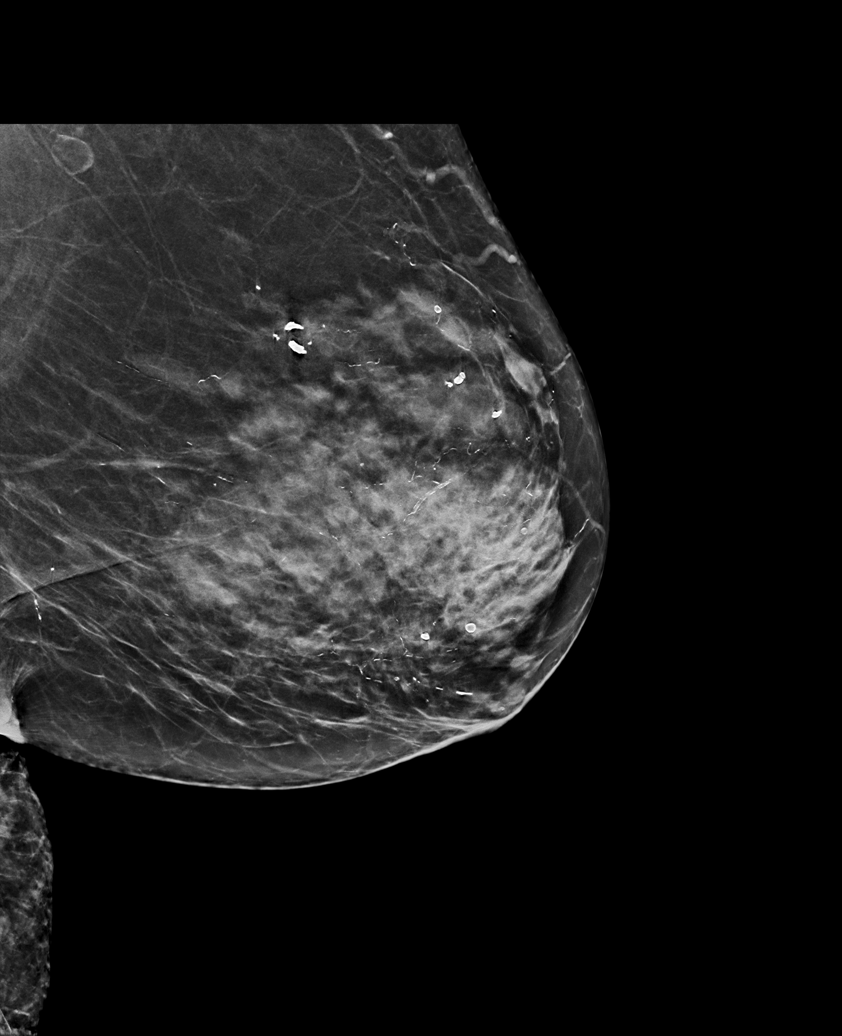

[6 of 36 positions shown; findings below may reference images not displayed]

ACR Breast Density Category c: The breast tissue is heterogeneously
dense, which may obscure small masses.
FINDINGS: There are no findings suspicious for malignancy. Images were
processed with CAD.
IMPRESSION: No mammographic evidence of malignancy. A result letter of this
screening mammogram will be mailed directly to the patient.

RECOMMENDATION:
Screening mammogram in one year. (Code:[id number]-U-LHB)

BI-RADS CATEGORY  1: Negative.

## 2019-10-02 ENCOUNTER — Ambulatory Visit: Payer: Medicare HMO | Admitting: Orthopedic Surgery

## 2019-10-03 DIAGNOSIS — I129 Hypertensive chronic kidney disease with stage 1 through stage 4 chronic kidney disease, or unspecified chronic kidney disease: Secondary | ICD-10-CM | POA: Diagnosis not present

## 2019-10-03 DIAGNOSIS — D631 Anemia in chronic kidney disease: Secondary | ICD-10-CM | POA: Diagnosis not present

## 2019-10-03 DIAGNOSIS — N2581 Secondary hyperparathyroidism of renal origin: Secondary | ICD-10-CM | POA: Diagnosis not present

## 2019-10-03 DIAGNOSIS — E782 Mixed hyperlipidemia: Secondary | ICD-10-CM | POA: Diagnosis not present

## 2019-10-03 DIAGNOSIS — I480 Paroxysmal atrial fibrillation: Secondary | ICD-10-CM | POA: Diagnosis not present

## 2019-10-03 DIAGNOSIS — M109 Gout, unspecified: Secondary | ICD-10-CM | POA: Diagnosis not present

## 2019-10-03 DIAGNOSIS — E669 Obesity, unspecified: Secondary | ICD-10-CM | POA: Diagnosis not present

## 2019-10-03 DIAGNOSIS — Z950 Presence of cardiac pacemaker: Secondary | ICD-10-CM | POA: Diagnosis not present

## 2019-10-03 DIAGNOSIS — E1122 Type 2 diabetes mellitus with diabetic chronic kidney disease: Secondary | ICD-10-CM | POA: Diagnosis not present

## 2019-10-03 DIAGNOSIS — N183 Chronic kidney disease, stage 3 unspecified: Secondary | ICD-10-CM | POA: Diagnosis not present

## 2019-10-05 ENCOUNTER — Encounter: Payer: Self-pay | Admitting: Orthopedic Surgery

## 2019-10-05 ENCOUNTER — Other Ambulatory Visit: Payer: Self-pay

## 2019-10-05 ENCOUNTER — Ambulatory Visit: Payer: Medicare HMO | Admitting: Orthopedic Surgery

## 2019-10-05 VITALS — BP 159/71 | HR 63 | Ht 66.0 in | Wt 190.0 lb

## 2019-10-05 DIAGNOSIS — M19049 Primary osteoarthritis, unspecified hand: Secondary | ICD-10-CM

## 2019-10-05 NOTE — Progress Notes (Signed)
Chief Complaint  Patient presents with  . Hand Pain    right feels better with splint use    [name] is being treated for [facility] arthritis of the right thumb. She is here for 4-week appointment after an injection in the [facility] joint and thumb splinting.  [name] says she is happy with her hand the swelling is gone down she is wearing the brace most of the day and at night  She can do most things without any discomfort  Her hand is no longer tender except that the margins of the [facility] joint the swelling is gone down her pinch strength is excellent  She will taper the brace in terms of time wearing if possible to wean it off or find a comfortable number of hours to wear it  Follow-up as needed  Encounter Diagnosis  Name Primary?  . CMC arthritis Yes   Fu prn

## 2019-10-05 NOTE — Patient Instructions (Signed)
Continue splinting and start a tapering program wearing it 1 to 2 hours less each day until symptoms go away, if symptoms start to increase then just add back 1 to 2 hours of splinting.

## 2019-10-06 DIAGNOSIS — E119 Type 2 diabetes mellitus without complications: Secondary | ICD-10-CM | POA: Diagnosis not present

## 2019-10-06 DIAGNOSIS — E559 Vitamin D deficiency, unspecified: Secondary | ICD-10-CM | POA: Diagnosis not present

## 2019-10-06 DIAGNOSIS — I10 Essential (primary) hypertension: Secondary | ICD-10-CM | POA: Diagnosis not present

## 2019-10-06 DIAGNOSIS — E782 Mixed hyperlipidemia: Secondary | ICD-10-CM | POA: Diagnosis not present

## 2019-10-06 DIAGNOSIS — I129 Hypertensive chronic kidney disease with stage 1 through stage 4 chronic kidney disease, or unspecified chronic kidney disease: Secondary | ICD-10-CM | POA: Diagnosis not present

## 2019-10-06 DIAGNOSIS — E1122 Type 2 diabetes mellitus with diabetic chronic kidney disease: Secondary | ICD-10-CM | POA: Diagnosis not present

## 2019-10-06 DIAGNOSIS — E039 Hypothyroidism, unspecified: Secondary | ICD-10-CM | POA: Diagnosis not present

## 2019-10-06 DIAGNOSIS — M1990 Unspecified osteoarthritis, unspecified site: Secondary | ICD-10-CM | POA: Diagnosis not present

## 2019-10-06 DIAGNOSIS — N184 Chronic kidney disease, stage 4 (severe): Secondary | ICD-10-CM | POA: Diagnosis not present

## 2019-10-24 DIAGNOSIS — I4821 Permanent atrial fibrillation: Secondary | ICD-10-CM | POA: Diagnosis not present

## 2019-10-24 DIAGNOSIS — Z7901 Long term (current) use of anticoagulants: Secondary | ICD-10-CM | POA: Diagnosis not present

## 2019-11-22 DIAGNOSIS — I4821 Permanent atrial fibrillation: Secondary | ICD-10-CM | POA: Diagnosis not present

## 2019-11-28 DIAGNOSIS — E1122 Type 2 diabetes mellitus with diabetic chronic kidney disease: Secondary | ICD-10-CM | POA: Diagnosis not present

## 2019-12-14 DIAGNOSIS — Z7901 Long term (current) use of anticoagulants: Secondary | ICD-10-CM | POA: Diagnosis not present

## 2019-12-14 DIAGNOSIS — E039 Hypothyroidism, unspecified: Secondary | ICD-10-CM | POA: Diagnosis not present

## 2019-12-14 DIAGNOSIS — N184 Chronic kidney disease, stage 4 (severe): Secondary | ICD-10-CM | POA: Diagnosis not present

## 2019-12-14 DIAGNOSIS — I10 Essential (primary) hypertension: Secondary | ICD-10-CM | POA: Diagnosis not present

## 2019-12-14 DIAGNOSIS — E559 Vitamin D deficiency, unspecified: Secondary | ICD-10-CM | POA: Diagnosis not present

## 2019-12-14 DIAGNOSIS — E782 Mixed hyperlipidemia: Secondary | ICD-10-CM | POA: Diagnosis not present

## 2019-12-14 DIAGNOSIS — E1122 Type 2 diabetes mellitus with diabetic chronic kidney disease: Secondary | ICD-10-CM | POA: Diagnosis not present

## 2019-12-14 DIAGNOSIS — E119 Type 2 diabetes mellitus without complications: Secondary | ICD-10-CM | POA: Diagnosis not present

## 2019-12-14 DIAGNOSIS — Z79891 Long term (current) use of opiate analgesic: Secondary | ICD-10-CM | POA: Diagnosis not present

## 2019-12-18 DIAGNOSIS — I129 Hypertensive chronic kidney disease with stage 1 through stage 4 chronic kidney disease, or unspecified chronic kidney disease: Secondary | ICD-10-CM | POA: Diagnosis not present

## 2019-12-18 DIAGNOSIS — E559 Vitamin D deficiency, unspecified: Secondary | ICD-10-CM | POA: Diagnosis not present

## 2019-12-18 DIAGNOSIS — Z950 Presence of cardiac pacemaker: Secondary | ICD-10-CM | POA: Diagnosis not present

## 2019-12-18 DIAGNOSIS — E669 Obesity, unspecified: Secondary | ICD-10-CM | POA: Diagnosis not present

## 2019-12-18 DIAGNOSIS — E782 Mixed hyperlipidemia: Secondary | ICD-10-CM | POA: Diagnosis not present

## 2019-12-18 DIAGNOSIS — E1122 Type 2 diabetes mellitus with diabetic chronic kidney disease: Secondary | ICD-10-CM | POA: Diagnosis not present

## 2019-12-18 DIAGNOSIS — Z0001 Encounter for general adult medical examination with abnormal findings: Secondary | ICD-10-CM | POA: Diagnosis not present

## 2019-12-18 DIAGNOSIS — N1831 Chronic kidney disease, stage 3a: Secondary | ICD-10-CM | POA: Diagnosis not present

## 2019-12-18 DIAGNOSIS — E039 Hypothyroidism, unspecified: Secondary | ICD-10-CM | POA: Diagnosis not present

## 2019-12-18 DIAGNOSIS — I482 Chronic atrial fibrillation, unspecified: Secondary | ICD-10-CM | POA: Diagnosis not present

## 2019-12-19 ENCOUNTER — Other Ambulatory Visit (HOSPITAL_COMMUNITY): Payer: Self-pay | Admitting: Internal Medicine

## 2019-12-19 ENCOUNTER — Ambulatory Visit (INDEPENDENT_AMBULATORY_CARE_PROVIDER_SITE_OTHER): Payer: Medicare HMO | Admitting: *Deleted

## 2019-12-19 DIAGNOSIS — I4821 Permanent atrial fibrillation: Secondary | ICD-10-CM | POA: Diagnosis not present

## 2019-12-19 DIAGNOSIS — I480 Paroxysmal atrial fibrillation: Secondary | ICD-10-CM

## 2019-12-19 DIAGNOSIS — Z1231 Encounter for screening mammogram for malignant neoplasm of breast: Secondary | ICD-10-CM

## 2019-12-20 LAB — CUP PACEART REMOTE DEVICE CHECK
Battery Remaining Longevity: 52 mo
Battery Remaining Percentage: 46 %
Battery Voltage: 2.86 V
Brady Statistic AP VP Percent: 20 %
Brady Statistic AP VS Percent: 69 %
Brady Statistic AS VP Percent: 1 %
Brady Statistic AS VS Percent: 11 %
Brady Statistic RA Percent Paced: 88 %
Brady Statistic RV Percent Paced: 20 %
Date Time Interrogation Session: 20210727033601
Implantable Lead Implant Date: 20130913
Implantable Lead Implant Date: 20130913
Implantable Lead Location: 753859
Implantable Lead Location: 753860
Implantable Lead Model: 1948
Implantable Pulse Generator Implant Date: 20130913
Lead Channel Impedance Value: 1250 Ohm
Lead Channel Impedance Value: 530 Ohm
Lead Channel Pacing Threshold Amplitude: 0.75 V
Lead Channel Pacing Threshold Amplitude: 1.5 V
Lead Channel Pacing Threshold Pulse Width: 0.5 ms
Lead Channel Pacing Threshold Pulse Width: 0.5 ms
Lead Channel Sensing Intrinsic Amplitude: 12 mV
Lead Channel Sensing Intrinsic Amplitude: 2.6 mV
Lead Channel Setting Pacing Amplitude: 2 V
Lead Channel Setting Pacing Amplitude: 3 V
Lead Channel Setting Pacing Pulse Width: 0.5 ms
Lead Channel Setting Sensing Sensitivity: 2 mV
Pulse Gen Model: 2210
Pulse Gen Serial Number: 7382865

## 2019-12-21 NOTE — Progress Notes (Signed)
Remote pacemaker transmission.   

## 2019-12-28 DIAGNOSIS — N184 Chronic kidney disease, stage 4 (severe): Secondary | ICD-10-CM | POA: Diagnosis not present

## 2019-12-28 DIAGNOSIS — E1122 Type 2 diabetes mellitus with diabetic chronic kidney disease: Secondary | ICD-10-CM | POA: Diagnosis not present

## 2019-12-28 DIAGNOSIS — E559 Vitamin D deficiency, unspecified: Secondary | ICD-10-CM | POA: Diagnosis not present

## 2019-12-28 DIAGNOSIS — I129 Hypertensive chronic kidney disease with stage 1 through stage 4 chronic kidney disease, or unspecified chronic kidney disease: Secondary | ICD-10-CM | POA: Diagnosis not present

## 2020-01-11 DIAGNOSIS — Z1211 Encounter for screening for malignant neoplasm of colon: Secondary | ICD-10-CM | POA: Diagnosis not present

## 2020-01-16 DIAGNOSIS — I4821 Permanent atrial fibrillation: Secondary | ICD-10-CM | POA: Diagnosis not present

## 2020-01-22 ENCOUNTER — Ambulatory Visit (HOSPITAL_COMMUNITY)
Admission: RE | Admit: 2020-01-22 | Discharge: 2020-01-22 | Disposition: A | Discharge status: Home / Self Care | Payer: Medicare HMO | Source: Ambulatory Visit | Attending: Internal Medicine | Admitting: Internal Medicine

## 2020-01-22 ENCOUNTER — Other Ambulatory Visit: Payer: Self-pay

## 2020-01-22 DIAGNOSIS — Z1231 Encounter for screening mammogram for malignant neoplasm of breast: Secondary | ICD-10-CM

## 2020-01-23 DIAGNOSIS — G8929 Other chronic pain: Secondary | ICD-10-CM | POA: Diagnosis not present

## 2020-01-23 DIAGNOSIS — E1122 Type 2 diabetes mellitus with diabetic chronic kidney disease: Secondary | ICD-10-CM | POA: Diagnosis not present

## 2020-01-23 DIAGNOSIS — D6869 Other thrombophilia: Secondary | ICD-10-CM | POA: Diagnosis not present

## 2020-01-23 DIAGNOSIS — E1142 Type 2 diabetes mellitus with diabetic polyneuropathy: Secondary | ICD-10-CM | POA: Diagnosis not present

## 2020-01-23 DIAGNOSIS — E785 Hyperlipidemia, unspecified: Secondary | ICD-10-CM | POA: Diagnosis not present

## 2020-01-23 DIAGNOSIS — I4891 Unspecified atrial fibrillation: Secondary | ICD-10-CM | POA: Diagnosis not present

## 2020-01-23 DIAGNOSIS — I69354 Hemiplegia and hemiparesis following cerebral infarction affecting left non-dominant side: Secondary | ICD-10-CM | POA: Diagnosis not present

## 2020-01-23 DIAGNOSIS — R69 Illness, unspecified: Secondary | ICD-10-CM | POA: Diagnosis not present

## 2020-01-23 DIAGNOSIS — I129 Hypertensive chronic kidney disease with stage 1 through stage 4 chronic kidney disease, or unspecified chronic kidney disease: Secondary | ICD-10-CM | POA: Diagnosis not present

## 2020-01-23 DIAGNOSIS — E039 Hypothyroidism, unspecified: Secondary | ICD-10-CM | POA: Diagnosis not present

## 2020-01-31 ENCOUNTER — Encounter: Payer: Self-pay | Admitting: Internal Medicine

## 2020-02-05 DIAGNOSIS — M109 Gout, unspecified: Secondary | ICD-10-CM | POA: Diagnosis not present

## 2020-02-05 DIAGNOSIS — E782 Mixed hyperlipidemia: Secondary | ICD-10-CM | POA: Diagnosis not present

## 2020-02-05 DIAGNOSIS — E669 Obesity, unspecified: Secondary | ICD-10-CM | POA: Diagnosis not present

## 2020-02-05 DIAGNOSIS — I129 Hypertensive chronic kidney disease with stage 1 through stage 4 chronic kidney disease, or unspecified chronic kidney disease: Secondary | ICD-10-CM | POA: Diagnosis not present

## 2020-02-05 DIAGNOSIS — Z950 Presence of cardiac pacemaker: Secondary | ICD-10-CM | POA: Diagnosis not present

## 2020-02-05 DIAGNOSIS — N2581 Secondary hyperparathyroidism of renal origin: Secondary | ICD-10-CM | POA: Diagnosis not present

## 2020-02-05 DIAGNOSIS — N183 Chronic kidney disease, stage 3 unspecified: Secondary | ICD-10-CM | POA: Diagnosis not present

## 2020-02-05 DIAGNOSIS — D631 Anemia in chronic kidney disease: Secondary | ICD-10-CM | POA: Diagnosis not present

## 2020-02-05 DIAGNOSIS — I480 Paroxysmal atrial fibrillation: Secondary | ICD-10-CM | POA: Diagnosis not present

## 2020-02-05 DIAGNOSIS — E1122 Type 2 diabetes mellitus with diabetic chronic kidney disease: Secondary | ICD-10-CM | POA: Diagnosis not present

## 2020-02-06 DIAGNOSIS — N1831 Chronic kidney disease, stage 3a: Secondary | ICD-10-CM | POA: Diagnosis not present

## 2020-02-06 DIAGNOSIS — E1122 Type 2 diabetes mellitus with diabetic chronic kidney disease: Secondary | ICD-10-CM | POA: Diagnosis not present

## 2020-02-06 DIAGNOSIS — R69 Illness, unspecified: Secondary | ICD-10-CM | POA: Diagnosis not present

## 2020-02-06 DIAGNOSIS — I129 Hypertensive chronic kidney disease with stage 1 through stage 4 chronic kidney disease, or unspecified chronic kidney disease: Secondary | ICD-10-CM | POA: Diagnosis not present

## 2020-02-06 DIAGNOSIS — E7849 Other hyperlipidemia: Secondary | ICD-10-CM | POA: Diagnosis not present

## 2020-02-14 DIAGNOSIS — I4821 Permanent atrial fibrillation: Secondary | ICD-10-CM | POA: Diagnosis not present

## 2020-02-28 DIAGNOSIS — E1122 Type 2 diabetes mellitus with diabetic chronic kidney disease: Secondary | ICD-10-CM | POA: Diagnosis not present

## 2020-03-05 DIAGNOSIS — I129 Hypertensive chronic kidney disease with stage 1 through stage 4 chronic kidney disease, or unspecified chronic kidney disease: Secondary | ICD-10-CM | POA: Diagnosis not present

## 2020-03-05 DIAGNOSIS — E7849 Other hyperlipidemia: Secondary | ICD-10-CM | POA: Diagnosis not present

## 2020-03-05 DIAGNOSIS — E1122 Type 2 diabetes mellitus with diabetic chronic kidney disease: Secondary | ICD-10-CM | POA: Diagnosis not present

## 2020-03-05 DIAGNOSIS — N1831 Chronic kidney disease, stage 3a: Secondary | ICD-10-CM | POA: Diagnosis not present

## 2020-03-12 DIAGNOSIS — I4821 Permanent atrial fibrillation: Secondary | ICD-10-CM | POA: Diagnosis not present

## 2020-03-17 NOTE — Progress Notes (Deleted)
Referring Provider: Celene Squibb, MD Primary Care Physician:  Celene Squibb, MD Primary Gastroenterologist:  Dr. Gala Romney  No chief complaint on file.   HPI:   [name] is a 79 y.o. female presenting today at the request of [place] Crane, Edwinna Areola, MD for positive Cologuard.  Last colonoscopy 03/10/2011 with colonic diverticulosis in the left colon and cecal segments, otherwise normal exam.  Recommended repeat colonoscopy in 5 years due to history of adenomatous colon polyps.  Past medical history significant for A. fib on warfarin, symptomatic bradycardia with permanent pacemaker, CVA, CKD, diabetes,.  Additional history listed in PMHx below.  Today:    Past Medical History:  Diagnosis Date  . A-fib ([place])   . Chronic kidney disease   . Colon polyps   . CVA (cerebral vascular accident) ([place])   . Depression   . DM (diabetes mellitus) ([place])   . GERD (gastroesophageal reflux disease)   . HTN (hypertension)   . Hypercholesterolemia   . Hypothyroidism   . Pancreatitis    biliary  . S/P colonoscopy March 2006   Dr. Olevia Perches: per e-chart report: diverticulosis, path adenomatous polyps  . S/P endoscopy March 2009   multiple 3-4 mmsessile gastric polyps, benign, singl antral erosions, negative H.pylori, reactive gastropathy ? NSAIDs  . 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 CHOLECYSTECTOMY    . COLONOSCOPY  03/10/2011   Procedure: COLONOSCOPY;  Surgeon: Daneil Dolin, MD;  [facility];  Service: Endoscopy;  Laterality: N/A;  12:45  . PACEMAKER INSERTION  02/05/12   SJM Accent DR RF implanted by Dr [name] for symptomatic bradycardia  . PERMANENT PACEMAKER INSERTION N/A 02/05/2012   Procedure: PERMANENT PACEMAKER INSERTION;  Surgeon: Thompson Grayer, MD;  Location: [facility] CATH LAB;  Service: Cardiovascular;  Laterality: N/A;  . SHOULDER SURGERY  2008   left shoulder replacement due to fracture      Current Outpatient Medications  Medication Sig Dispense Refill  . allopurinol (ZYLOPRIM) 100 MG tablet Take 200 mg by mouth daily.    Marland Kitchen atorvastatin (LIPITOR) 40 MG tablet Take 20 mg by mouth every other day.     . calcitRIOL (ROCALTROL) 0.25 MCG capsule Take 2 tablets by mouth every other day and take 1 tablet daily on the other days    . Cholecalciferol (VITAMIN D) 2000 units CAPS Take 2,000 Units by mouth daily.    . citalopram (CELEXA) 20 MG tablet Take 20 mg by mouth daily.      . DHA-EPA-Flaxseed Oil-Vitamin E CAPS Take 1 capsule by mouth 2 (two) times daily.    . flecainide (TAMBOCOR) 100 MG tablet Take 1 tablet (100 mg total) by mouth 2 (two) times daily. 180 tablet 3  . fluticasone (FLONASE) 50 MCG/ACT nasal spray Place 2 sprays into the nose daily as needed. For congestion    . furosemide (LASIX) 80 MG tablet Take 2 tablets (160 mg total) by mouth 2 (two) times daily. 180 tablet 3  . gabapentin (NEURONTIN) 100 MG capsule Take 1 capsule (100 mg total) by mouth 3 (three) times daily. 90 capsule 5  . glipiZIDE (GLUCOTROL XL) 10 MG 24 hr tablet Take 10 mg by mouth daily.      Marland Kitchen HYDROcodone-acetaminophen (NORCO/VICODIN) 5-325 MG tablet Take 1 tablet by mouth every 6 (six) hours as needed for moderate pain. 30 tablet 0  . levothyroxine (SYNTHROID, LEVOTHROID) 75 MCG  tablet Take 75 mcg by mouth daily.      . potassium chloride SA (K-DUR,KLOR-CON) 20 MEQ tablet Take 1 tablet by mouth as needed.     . vitamin B-12 (CYANOCOBALAMIN) 1000 MCG tablet Take 5,000 mcg by mouth daily.    Marland Kitchen warfarin (COUMADIN) 5 MG tablet Take as directed by the coumadin clinic     Current Facility-Administered Medications  Medication Dose Route Frequency Provider Last Rate Last Admin  . triamcinolone acetonide (KENALOG) 10 MG/ML injection 10 mg  10 mg Other Once Landis Martins, DPM        Allergies as of 03/18/2020 - Review Complete 10/05/2019  Allergen Reaction Noted  . Other Hives 12/20/2011  .  Penicillins Rash 04/22/2011  . Tetanus toxoids Rash 02/18/2011    Family History  Problem Relation Age of Onset  . Heart disease Unknown   . Arthritis Unknown   . Lung disease Unknown   . Diabetes Unknown   . Kidney disease Unknown   . Cancer Mother   . Heart disease Father   . Colon cancer Neg Hx     Social History   Socioeconomic History  . Marital status: Legally Separated    Spouse name: Not on file  . Number of children: Not on file  . Years of education: Not on file  . Highest education level: Not on file  Occupational History  . Not on file  Tobacco Use  . Smoking status: Never Smoker  . Smokeless tobacco: Never Used  Vaping Use  . Vaping Use: Never used  Substance and Sexual Activity  . Alcohol use: No  . Drug use: No  . Sexual activity: Not on file  Other Topics Concern  . Not on file  Social History Narrative  . Not on file   Social Determinants of Health   Financial Resource Strain:   . Difficulty of Paying Living Expenses: Not on file  Food Insecurity:   . Worried About Charity fundraiser in the Last Year: Not on file  . Ran Out of Food in the Last Year: Not on file  Transportation Needs:   . Lack of Transportation (Medical): Not on file  . Lack of Transportation (Non-Medical): Not on file  Physical Activity:   . Days of Exercise per Week: Not on file  . Minutes of Exercise per Session: Not on file  Stress:   . Feeling of Stress : Not on file  Social Connections:   . Frequency of Communication with Friends and Family: Not on file  . Frequency of Social Gatherings with Friends and Family: Not on file  . Attends Religious Services: Not on file  . Active Member of Clubs or Organizations: Not on file  . Attends Archivist Meetings: Not on file  . Marital Status: Not on file  Intimate Partner Violence:   . Fear of Current or Ex-Partner: Not on file  . Emotionally Abused: Not on file  . Physically Abused: Not on file  . Sexually  Abused: Not on file    Review of Systems: Gen: Denies any fever, chills, fatigue, weight loss, lack of appetite.  CV: Denies chest pain, heart palpitations, peripheral edema, syncope.  Resp: Denies shortness of breath at rest or with exertion. Denies wheezing or cough.  GI: Denies dysphagia or odynophagia. Denies jaundice, hematemesis, fecal incontinence. GU : Denies urinary burning, urinary frequency, urinary hesitancy MS: Denies joint pain, muscle weakness, cramps, or limitation of movement.  Derm: Denies rash, itching, dry  skin Psych: Denies depression, anxiety, memory loss, and confusion Heme: Denies bruising, bleeding, and enlarged lymph nodes.  Physical Exam: There were no vitals taken for this visit. General:   Alert and oriented. Pleasant and cooperative. Well-nourished and well-developed.  Head:  Normocephalic and atraumatic. Eyes:  Without icterus, sclera clear and conjunctiva pink.  Ears:  Normal auditory acuity. Nose:  No deformity, discharge,  or lesions. Mouth:  No deformity or lesions, oral mucosa pink.  Neck:  Supple, without mass or thyromegaly. Lungs:  Clear to auscultation bilaterally. No wheezes, rales, or rhonchi. No distress.  Heart:  S1, S2 present without murmurs appreciated.  Abdomen:  +BS, soft, non-tender and non-distended. No HSM noted. No guarding or rebound. No masses appreciated.  Rectal:  Deferred  Msk:  Symmetrical without gross deformities. Normal posture. Pulses:  Normal pulses noted. Extremities:  Without clubbing or edema. Neurologic:  Alert and  oriented x4;  grossly normal neurologically. Skin:  Intact without significant lesions or rashes. Cervical Nodes:  No significant cervical adenopathy. Psych:  Alert and cooperative. Normal mood and affect.

## 2020-03-18 ENCOUNTER — Ambulatory Visit: Payer: Medicare HMO | Admitting: Gastroenterology

## 2020-03-19 ENCOUNTER — Ambulatory Visit (INDEPENDENT_AMBULATORY_CARE_PROVIDER_SITE_OTHER): Payer: Medicare HMO

## 2020-03-19 DIAGNOSIS — I480 Paroxysmal atrial fibrillation: Secondary | ICD-10-CM | POA: Diagnosis not present

## 2020-03-19 LAB — CUP PACEART REMOTE DEVICE CHECK
Battery Remaining Longevity: 40 mo
Battery Remaining Percentage: 40 %
Battery Voltage: 2.84 V
Brady Statistic AP VP Percent: 20 %
Brady Statistic AP VS Percent: 68 %
Brady Statistic AS VP Percent: 1 %
Brady Statistic AS VS Percent: 12 %
Brady Statistic RA Percent Paced: 87 %
Brady Statistic RV Percent Paced: 20 %
Date Time Interrogation Session: 20211026083705
Implantable Lead Implant Date: 20130913
Implantable Lead Implant Date: 20130913
Implantable Lead Location: 753859
Implantable Lead Location: 753860
Implantable Lead Model: 1948
Implantable Pulse Generator Implant Date: 20130913
Lead Channel Impedance Value: 1250 Ohm
Lead Channel Impedance Value: 590 Ohm
Lead Channel Pacing Threshold Amplitude: 0.75 V
Lead Channel Pacing Threshold Amplitude: 1.5 V
Lead Channel Pacing Threshold Pulse Width: 0.5 ms
Lead Channel Pacing Threshold Pulse Width: 0.5 ms
Lead Channel Sensing Intrinsic Amplitude: 12 mV
Lead Channel Sensing Intrinsic Amplitude: 4 mV
Lead Channel Setting Pacing Amplitude: 2 V
Lead Channel Setting Pacing Amplitude: 3 V
Lead Channel Setting Pacing Pulse Width: 0.5 ms
Lead Channel Setting Sensing Sensitivity: 2 mV
Pulse Gen Model: 2210
Pulse Gen Serial Number: 7382865

## 2020-03-21 NOTE — Progress Notes (Signed)
Remote pacemaker transmission.   

## 2020-03-27 DIAGNOSIS — L570 Actinic keratosis: Secondary | ICD-10-CM | POA: Diagnosis not present

## 2020-03-27 DIAGNOSIS — Z1283 Encounter for screening for malignant neoplasm of skin: Secondary | ICD-10-CM | POA: Diagnosis not present

## 2020-03-27 DIAGNOSIS — D225 Melanocytic nevi of trunk: Secondary | ICD-10-CM | POA: Diagnosis not present

## 2020-03-27 DIAGNOSIS — X32XXXD Exposure to sunlight, subsequent encounter: Secondary | ICD-10-CM | POA: Diagnosis not present

## 2020-03-27 DIAGNOSIS — Z85820 Personal history of malignant melanoma of skin: Secondary | ICD-10-CM | POA: Diagnosis not present

## 2020-03-27 DIAGNOSIS — Z08 Encounter for follow-up examination after completed treatment for malignant neoplasm: Secondary | ICD-10-CM | POA: Diagnosis not present

## 2020-03-27 DIAGNOSIS — L820 Inflamed seborrheic keratosis: Secondary | ICD-10-CM | POA: Diagnosis not present

## 2020-03-29 ENCOUNTER — Encounter: Payer: Self-pay | Admitting: Internal Medicine

## 2020-03-29 ENCOUNTER — Other Ambulatory Visit: Payer: Self-pay

## 2020-03-29 ENCOUNTER — Ambulatory Visit: Payer: Medicare HMO | Admitting: Internal Medicine

## 2020-03-29 VITALS — BP 156/71 | HR 77 | Temp 96.9°F | Ht 66.0 in | Wt 195.4 lb

## 2020-03-29 DIAGNOSIS — R195 Other fecal abnormalities: Secondary | ICD-10-CM | POA: Diagnosis not present

## 2020-03-29 DIAGNOSIS — Z86010 Personal history of colonic polyps: Secondary | ICD-10-CM

## 2020-03-29 DIAGNOSIS — K5909 Other constipation: Secondary | ICD-10-CM

## 2020-03-29 NOTE — Patient Instructions (Signed)
Schedule a diagnostic colonoscopy - positive cologuard - propofol ASA 3  Will have patient remain on coumadin - uninterrupted for the procedure  Further recommendations to follow

## 2020-03-29 NOTE — Progress Notes (Signed)
Primary Care Physician:  Celene Squibb, MD Primary Gastroenterologist:  Dr. Gala Romney  Pre-Procedure History & Physical: HPI:  [name] is a 79 y.o. female here for consideration of colonoscopy given recent positive Cologuard.  This lady has a distant history colonic adenomas removed in [place].  I saw her in 2012 and performed a surveillance examination.  Diverticulosis only.  She was instructed to come back in 5 years but did not do so.  Aside from occasional constipation  - managed well with prune juice,  she is devoid of any lower GI tract symptoms such as bleeding or abdominal pain.  Recently, seen by Dr. Nevada Crane.  Cologuard positive through his office. 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  She checks her INR at home. No upper GI tract symptoms such as odynophagia, dysphagia, early satiety,  nausea or vomiting.  Only occasional mild reflux symptoms.  Past Medical History:  Diagnosis Date  . A-fib ([place])   . Chronic kidney disease   . Colon polyps   . CVA (cerebral vascular accident) ([place])   . Depression   . DM (diabetes mellitus) ([place])   . GERD (gastroesophageal reflux disease)   . HTN (hypertension)   . Hypercholesterolemia   . Hypothyroidism   . Pancreatitis    biliary  . S/P colonoscopy March 2006   Dr. Olevia Perches: per e-chart report: diverticulosis, path adenomatous polyps  . S/P endoscopy March 2009   multiple 3-4 mmsessile gastric polyps, benign, singl antral erosions, negative H.pylori, reactive gastropathy ? NSAIDs  . 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 CHOLECYSTECTOMY    . COLONOSCOPY  03/10/2011   Procedure: COLONOSCOPY;  Surgeon: Daneil Dolin, MD;  [facility];  Service: Endoscopy;  Laterality: N/A;  12:45  . PACEMAKER  INSERTION  02/05/12   SJM Accent DR RF implanted by Dr [name] for symptomatic bradycardia  . PERMANENT PACEMAKER INSERTION N/A 02/05/2012   Procedure: PERMANENT PACEMAKER INSERTION;  Surgeon: Thompson Grayer, MD;  Location: [facility] C[facility] LAB;  Service: Cardiovascular;  Laterality: N/A;  . SHOULDER SURGERY  2008   left shoulder replacement due to fracture     Prior to Admission medications   Medication Sig Start Date End Date Taking? Authorizing Provider  allopurinol (ZYLOPRIM) 100 MG tablet Take 200 mg by mouth daily. 03/15/17  Yes [provider]  atorvastatin (LIPITOR) 40 MG tablet Take 20 mg by mouth every other day.    Yes [provider]  calcitRIOL (ROCALTROL) 0.25 MCG capsule Take 2 tablets by mouth every other day and take 1 tablet daily on the other days   Yes [provider]  Cholecalciferol (VITAMIN D) 2000 units CAPS Take 2,000 Units by mouth daily.   Yes [provider]  citalopram (CELEXA) 20 MG tablet Take 20 mg by mouth daily.     Yes [provider]  DHA-EPA-Flaxseed Oil-Vitamin E CAPS Take 1 capsule by mouth 2 (two) times daily.   Yes [provider]  flecainide (TAMBOCOR) 100 MG tablet Take 1 tablet (100 mg total) by mouth 2 (two) times daily. 05/02/19  Yes Allred, Jeneen Rinks, MD  fluticasone (FLONASE) 50 MCG/ACT nasal spray Place 2 sprays into the nose daily as needed. For congestion   Yes [provider]  furosemide (LASIX) 80 MG tablet Take 2 tablets (160 mg total) by mouth 2 (two) times daily. 05/02/19  Yes Allred, Jeneen Rinks, MD  gabapentin (NEURONTIN) 100 MG capsule Take 1 capsule (100 mg total) by mouth 3 (three) times daily. 03/25/15  Yes Carole Civil, MD  glipiZIDE (GLUCOTROL XL) 10 MG 24 hr tablet Take 10 mg by mouth daily.     Yes [provider]  HYDROcodone-acetaminophen (NORCO) 7.5-325 MG tablet Take 1 tablet by mouth 3 (three) times daily as needed. 03/26/20  Yes [provider]  levothyroxine  (SYNTHROID, LEVOTHROID) 75 MCG tablet Take 75 mcg by mouth daily.     Yes [provider]  potassium chloride SA (K-DUR,KLOR-CON) 20 MEQ tablet Take 1 tablet by mouth as needed.  01/22/16  Yes [provider]  vitamin B-12 (CYANOCOBALAMIN) 1000 MCG tablet Take 5,000 mcg by mouth daily.   Yes [provider]  warfarin (COUMADIN) 5 MG tablet 5mg  Mon, Wed, Fri, Sat; 2.5mg  other days 02/06/12  Yes Hope, Jessica A, PA-C    Allergies as of 03/29/2020 - Review Complete 03/29/2020  Allergen Reaction Noted  . Other Hives 12/20/2011  . Penicillins Rash 04/22/2011  . Tetanus toxoids Rash 02/18/2011    Family History  Problem Relation Age of Onset  . Heart disease Unknown   . Arthritis Unknown   . Lung disease Unknown   . Diabetes Unknown   . Kidney disease Unknown   . Cancer Mother   . Heart disease Father   . Colon cancer Neg Hx     Social History   Socioeconomic History  . Marital status: Legally Separated    Spouse name: Not on file  . Number of children: Not on file  . Years of education: Not on file  . Highest education level: Not on file  Occupational History  . Not on file  Tobacco Use  . Smoking status: Never Smoker  . Smokeless tobacco: Never Used  Vaping Use  . Vaping Use: Never used  Substance and Sexual Activity  . Alcohol use: No  . Drug use: No  . Sexual activity: Not on file  Other Topics Concern  . Not on file  Social History Narrative  . Not on file   Social Determinants of Health   Financial Resource Strain:   . Difficulty of Paying Living Expenses: Not on file  Food Insecurity:   . Worried About Charity fundraiser in the Last Year: Not on file  . Ran Out of Food in the Last Year: Not on file  Transportation Needs:   . Lack of Transportation (Medical): Not on file  . Lack of Transportation (Non-Medical): Not on file  Physical Activity:   . Days of Exercise per Week: Not on file  . Minutes of Exercise per Session: Not on  file  Stress:   . Feeling of Stress : Not on file  Social Connections:   . Frequency of Communication with Friends and Family: Not on file  . Frequency of Social Gatherings with Friends and Family: Not on file  . Attends Religious Services: Not on file  . Active Member of Clubs or Organizations: Not on file  . Attends Archivist Meetings: Not on file  . Marital Status: Not on file  Intimate Partner Violence:   . Fear of Current or Ex-Partner: Not on file  . Emotionally Abused: Not on file  . Physically Abused: Not on file  . Sexually Abused: Not on file  Review of Systems: See HPI, otherwise negative ROS  Physical Exam: BP (!) 156/71   Pulse 77   Temp (!) 96.9 F (36.1 C) (Temporal)   Ht 5\' 6"  (1.676 m)   Wt 195 lb 6.4 oz (88.6 kg)   BMI 31.54 kg/m  General:   Alert,  Well-developed, well-nourished, pleasant and cooperative in NAD Lungs:  Clear throughout to auscultation.   No wheezes, crackles, or rhonchi. No acute distress. Heart:  Regular rate and rhythm; no murmurs, clicks, rubs,  or gallops. Abdomen: Non-distended, normal bowel sounds.  Soft and nontender without appreciable mass or hepatosplenomegaly.  Pulses:  Normal pulses noted. Extremities:  Without clubbing or edema.  Impression/Plan: Very pleasant 79 year old lady with a positive Cologuard.  This is in the setting of known adenomas removed previously.  Aside from mild constipation, she is devoid of any lower GI tract symptoms. To evaluate a positive Cologuard further, colonoscopy is indicated.  We discussed the risk benefits, limitations alternatives and imponderables. I take note of the fact the patient had a CVA secondary to atrial fibrillation prior to initiation of anticoagulation therapy. I discussed the approach of bridging therapy with Lovenox to minimize a window off anticoagulation for the procedure versus proceeding with a colonoscopy while remaining on Coumadin  -  uninterrupted.  Small polyps  can be removed without any increased risk in this setting.  Larger polyps would have to be approached with special consideration up to potentially performing a second colonoscopy with a bridging maneuver. After some discussion, it was mutually decided to proceed with colonoscopy while on Coumadin without interruption.  Recommendations:  Schedule a diagnostic colonoscopy - positive cologuard - propofol ASA 3  Will have patient remain on coumadin - uninterrupted for the procedure  Further recommendations to follow    Notice: This dictation was prepared with Dragon dictation along with smaller phrase technology. Any transcriptional errors that result from this process are unintentional and may not be corrected upon review.

## 2020-04-04 ENCOUNTER — Telehealth: Payer: Self-pay

## 2020-04-04 NOTE — Telephone Encounter (Signed)
Tried to call pt to schedule TCS w/Prop w/Dr. Gala Romney ASA 3, no answer, LMOVM for return call.

## 2020-04-05 DIAGNOSIS — N1831 Chronic kidney disease, stage 3a: Secondary | ICD-10-CM | POA: Diagnosis not present

## 2020-04-05 DIAGNOSIS — E7849 Other hyperlipidemia: Secondary | ICD-10-CM | POA: Diagnosis not present

## 2020-04-05 DIAGNOSIS — I129 Hypertensive chronic kidney disease with stage 1 through stage 4 chronic kidney disease, or unspecified chronic kidney disease: Secondary | ICD-10-CM | POA: Diagnosis not present

## 2020-04-05 DIAGNOSIS — E1122 Type 2 diabetes mellitus with diabetic chronic kidney disease: Secondary | ICD-10-CM | POA: Diagnosis not present

## 2020-04-08 NOTE — Telephone Encounter (Signed)
Letter mailed

## 2020-04-10 DIAGNOSIS — I4821 Permanent atrial fibrillation: Secondary | ICD-10-CM | POA: Diagnosis not present

## 2020-04-15 ENCOUNTER — Telehealth: Payer: Self-pay | Admitting: Internal Medicine

## 2020-04-15 NOTE — Telephone Encounter (Signed)
Noted.  Pre-op/covid test appt 07/19/20. Appt letter mailed with procedure instructions.

## 2020-04-15 NOTE — Telephone Encounter (Signed)
Pt received a letter that we were trying to reach her to schedule her colonoscopy. Please call [phone]

## 2020-04-15 NOTE — Telephone Encounter (Signed)
Called pt, TCS w/Propofol ASA 3 w/Dr. Gala Romney scheduled for 07/22/20 at 10:30am. Orders entered.

## 2020-04-15 NOTE — Telephone Encounter (Signed)
Dr. Gala Romney, she takes Glipizide 10mg  daily. How would you like for her to adjust diabetic med prior to her TCS?

## 2020-04-15 NOTE — Telephone Encounter (Signed)
Hold glipizide day before and day of procedure

## 2020-04-16 DIAGNOSIS — J069 Acute upper respiratory infection, unspecified: Secondary | ICD-10-CM | POA: Diagnosis not present

## 2020-04-16 DIAGNOSIS — R051 Acute cough: Secondary | ICD-10-CM | POA: Diagnosis not present

## 2020-04-17 ENCOUNTER — Telehealth: Payer: Self-pay | Admitting: *Deleted

## 2020-04-17 NOTE — Telephone Encounter (Signed)
Called pt to offer sooner appt for procedure but refused. She will stay on for 2/28

## 2020-04-23 ENCOUNTER — Other Ambulatory Visit (HOSPITAL_COMMUNITY): Payer: Self-pay

## 2020-04-23 DIAGNOSIS — M791 Myalgia, unspecified site: Secondary | ICD-10-CM | POA: Diagnosis not present

## 2020-04-23 DIAGNOSIS — R509 Fever, unspecified: Secondary | ICD-10-CM | POA: Diagnosis not present

## 2020-04-23 DIAGNOSIS — R059 Cough, unspecified: Secondary | ICD-10-CM | POA: Diagnosis not present

## 2020-04-23 DIAGNOSIS — U071 COVID-19: Secondary | ICD-10-CM | POA: Diagnosis not present

## 2020-04-24 ENCOUNTER — Encounter: Payer: Self-pay | Admitting: Oncology

## 2020-04-24 ENCOUNTER — Telehealth: Payer: Self-pay | Admitting: Oncology

## 2020-04-24 NOTE — Telephone Encounter (Signed)
Re: Mab Infusion  Called to Discuss with patient about Covid symptoms and the use of regeneron, a monoclonal antibody infusion for those with mild to moderate Covid symptoms and at a high risk of hospitalization.     Pt is qualified for this infusion at the [facility] infusion center due to co-morbid conditions and/or a member of an at-risk group.    Past Medical History:  Diagnosis Date  . A-fib ([place])   . Chronic kidney disease   . Colon polyps   . CVA (cerebral vascular accident) ([place])   . Depression   . DM (diabetes mellitus) ([place])   . GERD (gastroesophageal reflux disease)   . HTN (hypertension)   . Hypercholesterolemia   . Hypothyroidism   . Pancreatitis    biliary  . S/P colonoscopy March 2006   Dr. Olevia Perches: per e-chart report: diverticulosis, path adenomatous polyps  . S/P endoscopy March 2009   multiple 3-4 mmsessile gastric polyps, benign, singl antral erosions, negative H.pylori, reactive gastropathy ? NSAIDs  . 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 Left VM and MCM.  Rulon Abide, AGNP-C [phone] ([place])

## 2020-04-26 ENCOUNTER — Telehealth: Payer: Self-pay | Admitting: Unknown Physician Specialty

## 2020-04-26 DIAGNOSIS — R69 Illness, unspecified: Secondary | ICD-10-CM | POA: Diagnosis not present

## 2020-04-26 NOTE — Telephone Encounter (Signed)
Attempt #2  Called to Discuss with patient about Covid symptoms and the use of the monoclonal antibody infusion for those with mild to moderate Covid symptoms and at a high risk of hospitalization.     Pt appears to qualify for this infusion due to co-morbid conditions and/or a member of an at-risk group in accordance with the FDA Emergency Use Authorization.    Unable to reach pt   [facility]

## 2020-04-28 ENCOUNTER — Emergency Department (HOSPITAL_COMMUNITY): Payer: Medicare HMO

## 2020-04-28 ENCOUNTER — Encounter (HOSPITAL_COMMUNITY): Payer: Self-pay | Admitting: Emergency Medicine

## 2020-04-28 ENCOUNTER — Other Ambulatory Visit: Payer: Self-pay

## 2020-04-28 ENCOUNTER — Inpatient Hospital Stay (HOSPITAL_COMMUNITY)
Admission: EM | Admit: 2020-04-28 | Discharge: 2020-05-01 | DRG: 177 | Disposition: A | Discharge status: Home Health | Payer: Medicare HMO | Attending: Internal Medicine | Admitting: Internal Medicine

## 2020-04-28 DIAGNOSIS — Z7984 Long term (current) use of oral hypoglycemic drugs: Secondary | ICD-10-CM

## 2020-04-28 DIAGNOSIS — E876 Hypokalemia: Secondary | ICD-10-CM | POA: Diagnosis not present

## 2020-04-28 DIAGNOSIS — Z950 Presence of cardiac pacemaker: Secondary | ICD-10-CM | POA: Diagnosis not present

## 2020-04-28 DIAGNOSIS — R0602 Shortness of breath: Secondary | ICD-10-CM | POA: Diagnosis not present

## 2020-04-28 DIAGNOSIS — E119 Type 2 diabetes mellitus without complications: Secondary | ICD-10-CM

## 2020-04-28 DIAGNOSIS — J1282 Pneumonia due to coronavirus disease 2019: Secondary | ICD-10-CM | POA: Diagnosis present

## 2020-04-28 DIAGNOSIS — E785 Hyperlipidemia, unspecified: Secondary | ICD-10-CM | POA: Diagnosis present

## 2020-04-28 DIAGNOSIS — I130 Hypertensive heart and chronic kidney disease with heart failure and stage 1 through stage 4 chronic kidney disease, or unspecified chronic kidney disease: Secondary | ICD-10-CM | POA: Diagnosis not present

## 2020-04-28 DIAGNOSIS — R001 Bradycardia, unspecified: Secondary | ICD-10-CM | POA: Diagnosis not present

## 2020-04-28 DIAGNOSIS — Z794 Long term (current) use of insulin: Secondary | ICD-10-CM

## 2020-04-28 DIAGNOSIS — I495 Sick sinus syndrome: Secondary | ICD-10-CM | POA: Diagnosis present

## 2020-04-28 DIAGNOSIS — Z880 Allergy status to penicillin: Secondary | ICD-10-CM

## 2020-04-28 DIAGNOSIS — Z79890 Hormone replacement therapy (postmenopausal): Secondary | ICD-10-CM

## 2020-04-28 DIAGNOSIS — I5032 Chronic diastolic (congestive) heart failure: Secondary | ICD-10-CM | POA: Diagnosis present

## 2020-04-28 DIAGNOSIS — U071 COVID-19: Principal | ICD-10-CM

## 2020-04-28 DIAGNOSIS — Z887 Allergy status to serum and vaccine status: Secondary | ICD-10-CM

## 2020-04-28 DIAGNOSIS — E782 Mixed hyperlipidemia: Secondary | ICD-10-CM

## 2020-04-28 DIAGNOSIS — Z96612 Presence of left artificial shoulder joint: Secondary | ICD-10-CM | POA: Diagnosis present

## 2020-04-28 DIAGNOSIS — Z79899 Other long term (current) drug therapy: Secondary | ICD-10-CM

## 2020-04-28 DIAGNOSIS — N1831 Chronic kidney disease, stage 3a: Secondary | ICD-10-CM | POA: Diagnosis present

## 2020-04-28 DIAGNOSIS — M109 Gout, unspecified: Secondary | ICD-10-CM | POA: Diagnosis present

## 2020-04-28 DIAGNOSIS — J189 Pneumonia, unspecified organism: Secondary | ICD-10-CM | POA: Diagnosis not present

## 2020-04-28 DIAGNOSIS — J9601 Acute respiratory failure with hypoxia: Secondary | ICD-10-CM | POA: Diagnosis not present

## 2020-04-28 DIAGNOSIS — E1122 Type 2 diabetes mellitus with diabetic chronic kidney disease: Secondary | ICD-10-CM | POA: Diagnosis not present

## 2020-04-28 DIAGNOSIS — Z888 Allergy status to other drugs, medicaments and biological substances status: Secondary | ICD-10-CM

## 2020-04-28 DIAGNOSIS — E8809 Other disorders of plasma-protein metabolism, not elsewhere classified: Secondary | ICD-10-CM | POA: Diagnosis not present

## 2020-04-28 DIAGNOSIS — N179 Acute kidney failure, unspecified: Secondary | ICD-10-CM | POA: Diagnosis present

## 2020-04-28 DIAGNOSIS — E860 Dehydration: Secondary | ICD-10-CM | POA: Diagnosis present

## 2020-04-28 DIAGNOSIS — I480 Paroxysmal atrial fibrillation: Secondary | ICD-10-CM | POA: Diagnosis present

## 2020-04-28 DIAGNOSIS — Z8673 Personal history of transient ischemic attack (TIA), and cerebral infarction without residual deficits: Secondary | ICD-10-CM

## 2020-04-28 DIAGNOSIS — Z8249 Family history of ischemic heart disease and other diseases of the circulatory system: Secondary | ICD-10-CM

## 2020-04-28 DIAGNOSIS — R7401 Elevation of levels of liver transaminase levels: Secondary | ICD-10-CM | POA: Diagnosis not present

## 2020-04-28 DIAGNOSIS — R509 Fever, unspecified: Secondary | ICD-10-CM | POA: Diagnosis not present

## 2020-04-28 DIAGNOSIS — Z7901 Long term (current) use of anticoagulants: Secondary | ICD-10-CM

## 2020-04-28 DIAGNOSIS — K7689 Other specified diseases of liver: Secondary | ICD-10-CM | POA: Diagnosis not present

## 2020-04-28 DIAGNOSIS — Z833 Family history of diabetes mellitus: Secondary | ICD-10-CM

## 2020-04-28 DIAGNOSIS — E039 Hypothyroidism, unspecified: Secondary | ICD-10-CM | POA: Diagnosis not present

## 2020-04-28 LAB — PROCALCITONIN: Procalcitonin: <0.1 ng/mL

## 2020-04-28 LAB — CBC WITH DIFFERENTIAL/PLATELET
Abs Immature Granulocytes: 0.05 10*3/uL (ref 0.00–0.07)
Basophils Absolute: 0 10*3/uL (ref 0.0–0.1)
Basophils Relative: 0 %
Eosinophils Absolute: 0.1 10*3/uL (ref 0.0–0.5)
Eosinophils Relative: 1 %
HCT: 38.9 % (ref 36.0–46.0)
Hemoglobin: 12.8 g/dL (ref 12.0–15.0)
Immature Granulocytes: 1 %
Lymphocytes Relative: 16 %
Lymphs Abs: 1.6 10*3/uL (ref 0.7–4.0)
MCH: 32.7 pg (ref 26.0–34.0)
MCHC: 32.9 g/dL (ref 30.0–36.0)
MCV: 99.2 fL (ref 80.0–100.0)
Monocytes Absolute: 0.6 10*3/uL (ref 0.1–1.0)
Monocytes Relative: 6 %
Neutro Abs: 8 10*3/uL — ABNORMAL HIGH (ref 1.7–7.7)
Neutrophils Relative %: 76 %
Platelets: 191 10*3/uL (ref 150–400)
RBC: 3.92 MIL/uL (ref 3.87–5.11)
RDW: 14.3 % (ref 11.5–15.5)
WBC: 10.4 10*3/uL (ref 4.0–10.5)
nRBC: 0 % (ref 0.0–0.2)

## 2020-04-28 LAB — D-DIMER, QUANTITATIVE: D-Dimer, Quant: 0.81 ug/mL-FEU — ABNORMAL HIGH (ref 0.00–0.50)

## 2020-04-28 LAB — COMPREHENSIVE METABOLIC PANEL
ALT: 120 U/L — ABNORMAL HIGH (ref 0–44)
AST: 155 U/L — ABNORMAL HIGH (ref 15–41)
Albumin: 3.1 g/dL — ABNORMAL LOW (ref 3.5–5.0)
Alkaline Phosphatase: 91 U/L (ref 38–126)
Anion gap: 13 (ref 5–15)
BUN: 20 mg/dL (ref 8–23)
CO2: 28 mmol/L (ref 22–32)
Calcium: 8.5 mg/dL — ABNORMAL LOW (ref 8.9–10.3)
Chloride: 96 mmol/L — ABNORMAL LOW (ref 98–111)
Creatinine, Ser: 1.26 mg/dL — ABNORMAL HIGH (ref 0.44–1.00)
GFR, Estimated: 44 mL/min — ABNORMAL LOW (ref >60)
Glucose, Bld: 124 mg/dL — ABNORMAL HIGH (ref 70–99)
Potassium: 2.7 mmol/L — CL (ref 3.5–5.1)
Sodium: 137 mmol/L (ref 135–145)
Total Bilirubin: 1 mg/dL (ref 0.3–1.2)
Total Protein: 7.2 g/dL (ref 6.5–8.1)

## 2020-04-28 LAB — LACTATE DEHYDROGENASE: LDH: 332 U/L — ABNORMAL HIGH (ref 98–192)

## 2020-04-28 LAB — TRIGLYCERIDES: Triglycerides: 103 mg/dL (ref <150)

## 2020-04-28 LAB — LACTIC ACID, PLASMA: Lactic Acid, Venous: 1 mmol/L (ref 0.5–1.9)

## 2020-04-28 LAB — FIBRINOGEN: Fibrinogen: >800 mg/dL — ABNORMAL HIGH (ref 210–475)

## 2020-04-28 MED ORDER — ACETAMINOPHEN 650 MG RE SUPP: 650.0000 mg | Freq: Four times a day (QID) | RECTAL | Status: DC | PRN

## 2020-04-28 MED ORDER — ACETAMINOPHEN 325 MG PO TABS
650.0000 mg | ORAL_TABLET | Freq: Four times a day (QID) | ORAL | Status: DC | PRN
  Filled 2020-04-28: qty 2

## 2020-04-28 MED ORDER — ENOXAPARIN SODIUM 40 MG/0.4ML ~~LOC~~ SOLN
40.0000 mg | SUBCUTANEOUS | Status: DC
  Filled 2020-04-28: qty 0.4

## 2020-04-28 NOTE — H&P (Signed)
History and Physical  [name] FFM:[id number] DOB: [birth year] DOA: 04/28/2020  Referring physician: Davonna Belling, MD PCP: Celene Squibb, MD  Patient coming from: Home  Chief Complaint: Persistent fever  HPI: [name] is a 79 y.o. female with medical history significant for hyperlipidemia, gout, hypothyroidism, type 2 diabetes mellitus who presents to the emergency department due to persistent fever.  Patient complained of cough with production of grayish mucus (occasionally bloodstained), fever that has been as high as 102F, but usually around 100.53F which started on November 23, she had a positive Covid test on 11/30.  Patient complained of decreased appetite with loss of taste without loss of smell.  She complained of some shortness of breath mostly when she moves around, but she denies chest pain, headache, fever, abdominal pain. Patient has had 2 doses of Moderna virus vaccine  ED Course:  In the emergency department, she was intermittently tachypneic, BP was 138/64,, O2 sat was 99% on room air, but these dropped to mid 80s when she gets up from bed (per ED physician).  Work-up done in the ED showed normal CBC, hypokalemia, LDH 332, fibrinogen > 800, D-dimer 0.81, CRP 23.9, ferritin 743. Chest x-ray showed multifocal patchy opacities throughout both lungs in the basilar and peripheral predominance with findings compatible with multifocal pneumonia in the setting of COVID-19 Hospitalist was asked to admit patient for further evaluation and management.  Review of Systems: Constitutional: Negative for  decreased appetite and fever.  HENT: Negative for ear pain and sore throat.   Eyes: Negative for pain and visual disturbance.  Respiratory: Positive for shortness of breath and cough    Cardiovascular: Negative for chest pain and palpitations.  Gastrointestinal: Negative for abdominal pain and vomiting.  Endocrine: Negative for polyphagia and polyuria.  Genitourinary:  Negative for decreased urine volume, dysuria Musculoskeletal: Negative for arthralgias and back pain.  Skin: Negative for color change and rash.  Allergic/Immunologic: Negative for immunocompromised state.  Neurological: Negative for tremors, syncope, speech difficulty, weakness, light-headedness and headaches.  Hematological: Does not bruise/bleed easily.  All other systems reviewed and are negative   Past Medical History:  Diagnosis Date  . A-fib ([place])   . Chronic kidney disease   . Colon polyps   . CVA (cerebral vascular accident) ([place])   . Depression   . DM (diabetes mellitus) ([place])   . GERD (gastroesophageal reflux disease)   . HTN (hypertension)   . Hypercholesterolemia   . Hypothyroidism   . Pancreatitis    biliary  . S/P colonoscopy March 2006   Dr. Olevia Perches: per e-chart report: diverticulosis, path adenomatous polyps  . S/P endoscopy March 2009   multiple 3-4 mmsessile gastric polyps, benign, singl antral erosions, negative H.pylori, reactive gastropathy ? NSAIDs  . 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 CHOLECYSTECTOMY    . COLONOSCOPY  03/10/2011   Procedure: COLONOSCOPY;  Surgeon: Daneil Dolin, MD;  [facility];  Service: Endoscopy;  Laterality: N/A;  12:45  . PACEMAKER INSERTION  02/05/12   SJM Accent DR RF implanted by Dr [name] for symptomatic bradycardia  . PERMANENT PACEMAKER INSERTION N/A 02/05/2012   Procedure: PERMANENT PACEMAKER INSERTION;  Surgeon: Thompson Grayer, MD;  Location: [facility] CATH LAB;  Service: Cardiovascular;  Laterality: N/A;  . SHOULDER SURGERY  2008   left shoulder replacement due to fracture     Social History:  reports that she  has never smoked. She has never used smokeless tobacco. She reports that she does not drink alcohol and does not use drugs.   Allergies  Allergen Reactions  . Other Hives    Florinal for headaches.  . Penicillins Rash  .  Tetanus Toxoids Rash    Family History  Problem Relation Age of Onset  . Heart disease Other   . Arthritis Other   . Lung disease Other   . Diabetes Other   . Kidney disease Other   . Cancer Mother   . Heart disease Father   . Colon cancer Neg Hx     Prior to Admission medications   Medication Sig Start Date End Date Taking? Authorizing Provider  allopurinol (ZYLOPRIM) 100 MG tablet Take 200 mg by mouth daily. 03/15/17   [provider]  atorvastatin (LIPITOR) 40 MG tablet Take 20 mg by mouth every other day.     [provider]  calcitRIOL (ROCALTROL) 0.25 MCG capsule Take 2 tablets by mouth every other day and take 1 tablet daily on the other days    [provider]  Cholecalciferol (VITAMIN D) 2000 units CAPS Take 2,000 Units by mouth daily.    [provider]  citalopram (CELEXA) 20 MG tablet Take 20 mg by mouth daily.      [provider]  DHA-EPA-Flaxseed Oil-Vitamin E CAPS Take 1 capsule by mouth 2 (two) times daily.    [provider]  flecainide (TAMBOCOR) 100 MG tablet Take 1 tablet (100 mg total) by mouth 2 (two) times daily. 05/02/19   Allred, Jeneen Rinks, MD  fluticasone (FLONASE) 50 MCG/ACT nasal spray Place 2 sprays into the nose daily as needed. For congestion    [provider]  furosemide (LASIX) 80 MG tablet Take 2 tablets (160 mg total) by mouth 2 (two) times daily. 05/02/19   Allred, Jeneen Rinks, MD  gabapentin (NEURONTIN) 100 MG capsule Take 1 capsule (100 mg total) by mouth 3 (three) times daily. 03/25/15   Carole Civil, MD  glipiZIDE (GLUCOTROL XL) 10 MG 24 hr tablet Take 10 mg by mouth daily.      [provider]  HYDROcodone-acetaminophen (NORCO) 7.5-325 MG tablet Take 1 tablet by mouth 3 (three) times daily as needed. 03/26/20   [provider]  levothyroxine (SYNTHROID, LEVOTHROID) 75 MCG tablet Take 75 mcg by mouth daily.      [provider]  potassium chloride SA  (K-DUR,KLOR-CON) 20 MEQ tablet Take 1 tablet by mouth as needed.  01/22/16   [provider]  vitamin B-12 (CYANOCOBALAMIN) 1000 MCG tablet Take 5,000 mcg by mouth daily.    [provider]  warfarin (COUMADIN) 5 MG tablet 5mg  Mon, Wed, Fri, Sat; 2.5mg  other days 02/06/12   Hope, Leona Carry, [place]    Physical Exam: BP (!) 138/58   Pulse (!) 59   Temp 99.8 F (37.7 C) (Oral)   Resp 19   Ht 5\' 6"  (1.676 m)   Wt 80.7 kg   SpO2 97%   BMI 28.73 kg/m   . General: 79 y.o. year-old female well developed well nourished in no acute distress.  Alert and oriented x3. Marland Kitchen HEENT: NCAT, EOMI . Neck: Supple, trachea medial . Cardiovascular: PPM site noted on chest.  Regular rate and rhythm with no rubs or gallops.  No thyromegaly or JVD noted.  No lower extremity edema. 2/4 pulses in all 4 extremities. Marland Kitchen Respiratory: Clear to auscultation with no wheezes or rales.  . Abdomen:  Soft nontender nondistended with normal bowel sounds x4 quadrants. . Muskuloskeletal: No cyanosis, clubbing or edema noted bilaterally . Neuro: CN II-XII intact, strength, sensation, reflexes . Skin: No ulcerative lesions noted or rashes . Psychiatry: Judgement and insight appear normal. Mood is appropriate for condition and setting          Labs on Admission:  Basic Metabolic Panel: Recent Labs  Lab 04/28/20 1929  NA 137  K 2.7*  CL 96*  CO2 28  GLUCOSE 124*  BUN 20  CREATININE 1.26*  CALCIUM 8.5*   Liver Function Tests: Recent Labs  Lab 04/28/20 1929  AST 155*  ALT 120*  ALKPHOS 91  BILITOT 1.0  PROT 7.2  ALBUMIN 3.1*   No results for input(s): LIPASE, AMYLASE in the last 168 hours. No results for input(s): AMMONIA in the last 168 hours. CBC: Recent Labs  Lab 04/28/20 1929  WBC 10.4  NEUTROABS 8.0*  HGB 12.8  HCT 38.9  MCV 99.2  PLT 191   Cardiac Enzymes: No results for input(s): CKTOTAL, CKMB, CKMBINDEX, TROPONINI in the last 168 hours.  BNP (last 3 results) No results for  input(s): BNP in the last 8760 hours.  ProBNP (last 3 results) No results for input(s): PROBNP in the last 8760 hours.  CBG: No results for input(s): GLUCAP in the last 168 hours.  Radiological Exams on Admission: DG Chest Port 1 View  Result Date: 04/28/2020 CLINICAL DATA:  COVID-19 positive 03/23/2020, shortness of breath and fever EXAM: PORTABLE CHEST 1 VIEW COMPARISON:  Radiograph 02/15/2018 FINDINGS: Multifocal patchy opacities are present throughout both lungs in a basilar and peripheral predominance which appears superimposed on more chronic interstitial changes and low lung volumes. No pneumothorax or effusion. Accessory azygos fissure in tortuosity of the brachiocephalic vasculature are similar to prior. Pacer pack overlies left chest wall with leads in stable position at the right atrium and cardiac apex. The aorta is calcified. The remaining cardiomediastinal contours are unremarkable. No acute osseous or soft tissue abnormality. Degenerative changes are present in the imaged spine and shoulders. Prior left shoulder hemiarthroplasty appears high-riding, likely related to the rotator cuff insufficiency. IMPRESSION: Multifocal patchy opacities throughout both lungs in a basilar and peripheral predominance. Findings are compatible with multifocal pneumonia in the setting of COVID-19. Superimposed on more chronic interstitial changes and low lung volumes. Aortic Atherosclerosis (ICD10-I70.0). Electronically Signed   By: Lovena Le M.D.   On: 04/28/2020 19:48    EKG: I independently viewed the EKG done and my findings are as followed: No EKG was done in the ED  Assessment/Plan Present on Admission: . Pneumonia due to COVID-19 virus . HLD (hyperlipidemia) . Symptomatic bradycardia . Pacemaker-St.Jude  Principal Problem:   Pneumonia due to COVID-19 virus Active Problems:   Symptomatic bradycardia   Pacemaker-St.Jude   HLD (hyperlipidemia)   Hypokalemia    Hypoalbuminemia  Pneumonia due to COVID-19 virus Continue albuterol q.6h Continue IV Decadron 6 mg daily Continue IV Remdesivir per pharmacy protocol Continue Mucinex, Robitussin and Tussionex Continue Tylenol p.r.n. for fever Patient is currently not requiring oxygen at rest, consider supplemental oxygen as required. Continue incentive spirometry and flutter valve q32min as tolerated Encourage proning, early ambulation, and side laying as tolerated Continue airborne isolation precaution Inflammatory markers: LDH:332  CRP:  23.9 D-dimer: 0.81 Ferritin: 743 D-dimer: 0.81 (Well's score was 0.0 points with 1.3% chance of PE) Continue monitoring daily inflammatory markers Physician PPE:  Surgical mask with face shield, N-95, nonsterile gloves, disposable gown, head and shoe  cover s Patient PPE:  Face mask   Hypokalemia K+ 2.7, this was replenished  Hypoalbuminemia possibly secondary to mild protein calorie malnutrition Albumin 3.1; protein supplement will be provided  Hypothyroidism Continue Synthroid  Type 2 diabetes mellitus Continue insulin sliding scale and hypoglycemic protocol Glipizide will be held at this time  History of symptomatic bradycardia status post [facility] Jude pacemaker Stable; PPM insertion site noted  History of A-Fib Continue home meds after med rec confirmed by pharmacy   DVT prophylaxis: Lovenox  Code Status: Full code  Family Communication: None at bedside  Disposition Plan:  Patient is from:                        home Anticipated DC to:                   SNF or family members home Anticipated DC date:               2-3 days Anticipated DC barriers:           Patient is unstable to be discharged at this time due to pneumonia secondary to COVID-19 virus requiring inpatient management   Consults called: None  Admission status: Inpatient    Bernadette Hoit MD [facility]  04/29/2020, 1:11 AM

## 2020-04-28 NOTE — ED Provider Notes (Signed)
[facility] EMERGENCY DEPARTMENT Provider Note   CSN: [id number] Arrival date & time: 04/28/20  1741     History Chief Complaint  Patient presents with  . Covid Positive    [name] is a 79 y.o. female.  HPI Patient presents with Covid infection.  Tested positive on November 30 but symptoms began on November 23.  States she has had fevers are running around 100.7.  Now states up to 102.  Some shortness of breath.  States she still feels if she is able to move around.  No chest pain.  Having a cough without much sputum production.  No abdominal pain.    Past Medical History:  Diagnosis Date  . A-fib ([place])   . Chronic kidney disease   . Colon polyps   . CVA (cerebral vascular accident) ([place])   . Depression   . DM (diabetes mellitus) ([place])   . GERD (gastroesophageal reflux disease)   . HTN (hypertension)   . Hypercholesterolemia   . Hypothyroidism   . Pancreatitis    biliary  . S/P colonoscopy March 2006   Dr. Olevia Perches: per e-chart report: diverticulosis, path adenomatous polyps  . S/P endoscopy March 2009   multiple 3-4 mmsessile gastric polyps, benign, singl antral erosions, negative H.pylori, reactive gastropathy ? NSAIDs  . 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 Pacemaker-St.Jude 02/09/2012  . Symptomatic bradycardia 02/04/2012  . Atrial fibrillation ([place]) 02/04/2012  . Medial meniscus, posterior horn derangement 11/16/2011  . Osteoarthritis, knee 11/16/2011  . Loose, body, joint, knee 11/16/2011  . Osteoarthritis of left knee 11/04/2011  . Medial meniscus tear 11/04/2011  . Adenomatous polyps 02/19/2011  . Acute ill-defined cerebrovascular disease 04/25/2007  . A-fib ([place]) 04/25/2007  . Clinical depression 04/25/2007  . HLD (hyperlipidemia) 04/25/2007  . Diabetes ([place]) 04/25/2007  . 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 Essential  (primary) hypertension 04/25/2007  . Adult hypothyroidism 04/25/2007    Past Surgical History:  Procedure Laterality Date  . CHOLECYSTECTOMY    . COLONOSCOPY  03/10/2011   Procedure: COLONOSCOPY;  Surgeon: Daneil Dolin, MD;  Location[facility];  Service: Endoscopy;  Laterality: N/A;  12:45  . PACEMAKER INSERTION  02/05/12   SJM Accent DR RF implanted by Dr [name] for symptomatic bradycardia  . PERMANENT PACEMAKER INSERTION N/A 02/05/2012   Procedure: PERMANENT PACEMAKER INSERTION;  Surgeon: Thompson Grayer, MD;  Location: [facility] CATH LAB;  Service: Cardiovascular;  Laterality: N/A;  . SHOULDER SURGERY  2008   left shoulder replacement due to fracture      OB History   No obstetric history on file.     Family History  Problem Relation Age of Onset  . Heart disease Other   . Arthritis Other   . Lung disease Other   . Diabetes Other   . Kidney disease Other   . Cancer Mother   . Heart disease Father   . Colon cancer Neg Hx     Social History   Tobacco Use  . Smoking status: Never Smoker  . Smokeless tobacco: Never Used  Vaping Use  . Vaping Use: Never used  Substance Use Topics  . Alcohol use: No  . Drug use: No    Home Medications Prior to Admission medications   Medication Sig Start Date End Date Taking? Authorizing Provider  allopurinol (ZYLOPRIM) 100 MG tablet Take 200 mg by  mouth daily. 03/15/17   [provider]  atorvastatin (LIPITOR) 40 MG tablet Take 20 mg by mouth every other day.     [provider]  calcitRIOL (ROCALTROL) 0.25 MCG capsule Take 2 tablets by mouth every other day and take 1 tablet daily on the other days    [provider]  Cholecalciferol (VITAMIN D) 2000 units CAPS Take 2,000 Units by mouth daily.    [provider]  citalopram (CELEXA) 20 MG tablet Take 20 mg by mouth daily.      [provider]  DHA-EPA-Flaxseed Oil-Vitamin E CAPS Take 1 capsule by mouth 2 (two) times daily.    [provider]  flecainide (TAMBOCOR) 100 MG tablet Take 1 tablet (100 mg total) by mouth 2 (two) times daily. 05/02/19   Allred, Jeneen Rinks, MD  fluticasone (FLONASE) 50 MCG/ACT nasal spray Place 2 sprays into the nose daily as needed. For congestion    [provider]  furosemide (LASIX) 80 MG tablet Take 2 tablets (160 mg total) by mouth 2 (two) times daily. 05/02/19   Allred, Jeneen Rinks, MD  gabapentin (NEURONTIN) 100 MG capsule Take 1 capsule (100 mg total) by mouth 3 (three) times daily. 03/25/15   Carole Civil, MD  glipiZIDE (GLUCOTROL XL) 10 MG 24 hr tablet Take 10 mg by mouth daily.      [provider]  HYDROcodone-acetaminophen (NORCO) 7.5-325 MG tablet Take 1 tablet by mouth 3 (three) times daily as needed. 03/26/20   [provider]  levothyroxine (SYNTHROID, LEVOTHROID) 75 MCG tablet Take 75 mcg by mouth daily.      [provider]  potassium chloride SA (K-DUR,KLOR-CON) 20 MEQ tablet Take 1 tablet by mouth as needed.  01/22/16   [provider]  vitamin B-12 (CYANOCOBALAMIN) 1000 MCG tablet Take 5,000 mcg by mouth daily.    [provider]  warfarin (COUMADIN) 5 MG tablet 5mg  Mon, Wed, Fri, Sat; 2.5mg  other days 02/06/12   Hope, Janett Billow A, PA-C    Allergies    Other, Penicillins, and Tetanus toxoids  Review of Systems   Review of Systems  Constitutional: Positive for appetite change and fever.  Respiratory: Positive for shortness of breath.   Gastrointestinal: Negative for abdominal pain.  Genitourinary: Negative for flank pain.  Musculoskeletal: Negative for back pain.  Skin: Negative for rash.  Neurological: Negative for weakness.  Psychiatric/Behavioral: Negative for confusion.    Physical Exam Updated Vital Signs BP (!) 126/97   Pulse 61   Temp 99.8 F (37.7 C) (Oral)   Resp 16   Ht 5\' 6"  (1.676 m)   Wt 80.7 kg   SpO2 97%   BMI 28.73 kg/m   Physical Exam Vitals reviewed.  HENT:     Head: Normocephalic.   Eyes:     Extraocular Movements: Extraocular movements intact.     Pupils: Pupils are equal, round, and reactive to light.  Cardiovascular:     Rate and Rhythm: Normal rate.  Pulmonary:     Effort: No respiratory distress.     Breath sounds: No wheezing or rhonchi.  Abdominal:     Tenderness: There is no abdominal tenderness.  Musculoskeletal:        General: No tenderness.     Cervical back: Neck supple.  Skin:    General: Skin is warm.     Capillary Refill: Capillary refill takes less than 2 seconds.  Neurological:     Mental Status: She is alert and oriented to  person, place, and time.  Psychiatric:        Behavior: Behavior normal.     ED Results / Procedures / Treatments   Labs (all labs ordered are listed, but only abnormal results are displayed) Labs Reviewed  CBC WITH DIFFERENTIAL/PLATELET - Abnormal; Notable for the following components:      Result Value   Neutro Abs 8.0 (*)    All other components within normal limits  COMPREHENSIVE METABOLIC PANEL - Abnormal; Notable for the following components:   Potassium 2.7 (*)    Chloride 96 (*)    Glucose, Bld 124 (*)    Creatinine, Ser 1.26 (*)    Calcium 8.5 (*)    Albumin 3.1 (*)    AST 155 (*)    ALT 120 (*)    GFR, Estimated 44 (*)    All other components within normal limits  D-DIMER, QUANTITATIVE (NOT AT [facility]) - Abnormal; Notable for the following components:   D-Dimer, Quant 0.81 (*)    All other components within normal limits  LACTATE DEHYDROGENASE - Abnormal; Notable for the following components:   LDH 332 (*)    All other components within normal limits  FIBRINOGEN - Abnormal; Notable for the following components:   Fibrinogen >800 (*)    All other components within normal limits  CULTURE, BLOOD (ROUTINE X 2)  CULTURE, BLOOD (ROUTINE X 2)  LACTIC ACID, PLASMA  PROCALCITONIN  TRIGLYCERIDES  LACTIC ACID, PLASMA  FERRITIN  C-REACTIVE PROTEIN    EKG None  Radiology DG Chest Port 1 View   Result Date: 04/28/2020 CLINICAL DATA:  COVID-19 positive 03/23/2020, shortness of breath and fever EXAM: PORTABLE CHEST 1 VIEW COMPARISON:  Radiograph 02/15/2018 FINDINGS: Multifocal patchy opacities are present throughout both lungs in a basilar and peripheral predominance which appears superimposed on more chronic interstitial changes and low lung volumes. No pneumothorax or effusion. Accessory azygos fissure in tortuosity of the brachiocephalic vasculature are similar to prior. Pacer pack overlies left chest wall with leads in stable position at the right atrium and cardiac apex. The aorta is calcified. The remaining cardiomediastinal contours are unremarkable. No acute osseous or soft tissue abnormality. Degenerative changes are present in the imaged spine and shoulders. Prior left shoulder hemiarthroplasty appears high-riding, likely related to the rotator cuff insufficiency. IMPRESSION: Multifocal patchy opacities throughout both lungs in a basilar and peripheral predominance. Findings are compatible with multifocal pneumonia in the setting of COVID-19. Superimposed on more chronic interstitial changes and low lung volumes. Aortic Atherosclerosis (ICD10-I70.0). Electronically Signed   By: Lovena Le M.D.   On: 04/28/2020 19:48    Procedures Procedures (including critical care time)  Medications Ordered in ED Medications - No data to display  ED Course  I have reviewed the triage vital signs and the nursing notes.  Pertinent labs & imaging results that were available during my care of the patient were reviewed by me and considered in my medical decision making (see chart for details).    MDM Rules/Calculators/A&P                          Patient presents with COVID-19 and fevers.  For symptoms 2 days before Thanksgiving.  Patient has been vaccinated.  However was having more fevers so came in.  Patient gets hypoxic with any ambulation down to the mid 80s.  Does okay at rest however.  With  this hypoxia I feel the patient benefit from admission to the hospital.  X-ray shows likely Covid pneumonia.  Will discuss with hospitalist.  Helen Hashimoto was evaluated in Emergency Department on 04/28/2020 for the symptoms described in the history of present illness. She was evaluated in the context of the global COVID-19 pandemic, which necessitated consideration that the patient might be at risk for infection with the SARS-CoV-2 virus that causes COVID-19. Institutional protocols and algorithms that pertain to the evaluation of patients at risk for COVID-19 are in a state of rapid change based on information released by regulatory bodies including the CDC and federal and state organizations. These policies and algorithms were followed during the patient's care in the ED. Final Clinical Impression(s) / ED Diagnoses Final diagnoses:  [id number]    Rx / DC Orders ED Discharge Orders    None       Davonna Belling, MD 04/28/20 2115

## 2020-04-28 NOTE — ED Triage Notes (Signed)
Pt c/o shortness of breath and fever. Pt tested covid positive on 04/23/20

## 2020-04-29 ENCOUNTER — Inpatient Hospital Stay (HOSPITAL_COMMUNITY): Payer: Medicare HMO

## 2020-04-29 DIAGNOSIS — E876 Hypokalemia: Secondary | ICD-10-CM

## 2020-04-29 DIAGNOSIS — E8809 Other disorders of plasma-protein metabolism, not elsewhere classified: Secondary | ICD-10-CM

## 2020-04-29 DIAGNOSIS — J9601 Acute respiratory failure with hypoxia: Secondary | ICD-10-CM

## 2020-04-29 LAB — COMPREHENSIVE METABOLIC PANEL
ALT: 142 U/L — ABNORMAL HIGH (ref 0–44)
AST: 183 U/L — ABNORMAL HIGH (ref 15–41)
Albumin: 2.9 g/dL — ABNORMAL LOW (ref 3.5–5.0)
Alkaline Phosphatase: 88 U/L (ref 38–126)
Anion gap: 13 (ref 5–15)
BUN: 24 mg/dL — ABNORMAL HIGH (ref 8–23)
CO2: 23 mmol/L (ref 22–32)
Calcium: 8.3 mg/dL — ABNORMAL LOW (ref 8.9–10.3)
Chloride: 100 mmol/L (ref 98–111)
Creatinine, Ser: 1.17 mg/dL — ABNORMAL HIGH (ref 0.44–1.00)
GFR, Estimated: 48 mL/min — ABNORMAL LOW (ref >60)
Glucose, Bld: 181 mg/dL — ABNORMAL HIGH (ref 70–99)
Potassium: 3.1 mmol/L — ABNORMAL LOW (ref 3.5–5.1)
Sodium: 136 mmol/L (ref 135–145)
Total Bilirubin: 1.1 mg/dL (ref 0.3–1.2)
Total Protein: 6.8 g/dL (ref 6.5–8.1)

## 2020-04-29 LAB — CBC
HCT: 37.9 % (ref 36.0–46.0)
Hemoglobin: 12.4 g/dL (ref 12.0–15.0)
MCH: 33.2 pg (ref 26.0–34.0)
MCHC: 32.7 g/dL (ref 30.0–36.0)
MCV: 101.3 fL — ABNORMAL HIGH (ref 80.0–100.0)
Platelets: 191 10*3/uL (ref 150–400)
RBC: 3.74 MIL/uL — ABNORMAL LOW (ref 3.87–5.11)
RDW: 14.3 % (ref 11.5–15.5)
WBC: 9.6 10*3/uL (ref 4.0–10.5)
nRBC: 0 % (ref 0.0–0.2)

## 2020-04-29 LAB — FERRITIN: Ferritin: 743 ng/mL — ABNORMAL HIGH (ref 11–307)

## 2020-04-29 LAB — HEMOGLOBIN A1C
Hgb A1c MFr Bld: 6.9 % — ABNORMAL HIGH (ref 4.8–5.6)
Mean Plasma Glucose: 151.33 mg/dL

## 2020-04-29 LAB — HEPATITIS C ANTIBODY: HCV Ab: NONREACTIVE

## 2020-04-29 LAB — RESP PANEL BY RT-PCR (FLU A&B, COVID) ARPGX2
Influenza A by PCR: NEGATIVE
Influenza B by PCR: NEGATIVE
SARS Coronavirus 2 by RT PCR: POSITIVE — AB

## 2020-04-29 LAB — APTT: aPTT: 55 seconds — ABNORMAL HIGH (ref 24–36)

## 2020-04-29 LAB — PROTIME-INR
INR: 2.4 — ABNORMAL HIGH (ref 0.8–1.2)
Prothrombin Time: 25 seconds — ABNORMAL HIGH (ref 11.4–15.2)

## 2020-04-29 LAB — HEPATITIS B SURFACE ANTIGEN: Hepatitis B Surface Ag: NONREACTIVE

## 2020-04-29 LAB — GLUCOSE, CAPILLARY: Glucose-Capillary: 307 mg/dL — ABNORMAL HIGH (ref 70–99)

## 2020-04-29 LAB — MAGNESIUM: Magnesium: 1.8 mg/dL (ref 1.7–2.4)

## 2020-04-29 LAB — C-REACTIVE PROTEIN: CRP: 23.9 mg/dL — ABNORMAL HIGH (ref <1.0)

## 2020-04-29 LAB — LACTIC ACID, PLASMA: Lactic Acid, Venous: 0.9 mmol/L (ref 0.5–1.9)

## 2020-04-29 LAB — CBG MONITORING, ED
Glucose-Capillary: 191 mg/dL — ABNORMAL HIGH (ref 70–99)
Glucose-Capillary: 222 mg/dL — ABNORMAL HIGH (ref 70–99)
Glucose-Capillary: 337 mg/dL — ABNORMAL HIGH (ref 70–99)

## 2020-04-29 LAB — PHOSPHORUS: Phosphorus: 3.8 mg/dL (ref 2.5–4.6)

## 2020-04-29 MED ORDER — POTASSIUM CHLORIDE CRYS ER 20 MEQ PO TBCR
40.0000 meq | EXTENDED_RELEASE_TABLET | Freq: Once | ORAL | Status: AC
  Administered 2020-04-29: 40 meq via ORAL
  Filled 2020-04-29: qty 2

## 2020-04-29 MED ORDER — PANTOPRAZOLE SODIUM 40 MG PO TBEC
40.0000 mg | DELAYED_RELEASE_TABLET | Freq: Every day | ORAL | Status: DC
  Administered 2020-04-29 – 2020-05-01 (×3): 40 mg via ORAL
  Filled 2020-04-29 (×3): qty 1

## 2020-04-29 MED ORDER — SODIUM CHLORIDE 0.9 % IV SOLN
100.0000 mg | Freq: Every day | INTRAVENOUS | Status: DC
  Filled 2020-04-29: qty 20

## 2020-04-29 MED ORDER — WARFARIN SODIUM 5 MG PO TABS
5.0000 mg | ORAL_TABLET | Freq: Once | ORAL | Status: AC
  Administered 2020-04-29: 5 mg via ORAL
  Filled 2020-04-29: qty 1

## 2020-04-29 MED ORDER — DM-GUAIFENESIN ER 30-600 MG PO TB12
1.0000 | ORAL_TABLET | Freq: Two times a day (BID) | ORAL | Status: DC
  Administered 2020-04-29 – 2020-05-01 (×6): 1 via ORAL
  Filled 2020-04-29 (×6): qty 1

## 2020-04-29 MED ORDER — ALBUTEROL SULFATE HFA 108 (90 BASE) MCG/ACT IN AERS
2.0000 | INHALATION_SPRAY | Freq: Four times a day (QID) | RESPIRATORY_TRACT | Status: DC
  Administered 2020-04-29 – 2020-04-30 (×5): 2 via RESPIRATORY_TRACT
  Filled 2020-04-29: qty 6.7

## 2020-04-29 MED ORDER — INSULIN ASPART 100 UNIT/ML ~~LOC~~ SOLN
0.0000 [IU] | Freq: Every day | SUBCUTANEOUS | Status: DC
  Administered 2020-04-29: 4 [IU] via SUBCUTANEOUS
  Administered 2020-04-30: 2 [IU] via SUBCUTANEOUS

## 2020-04-29 MED ORDER — FLECAINIDE ACETATE 50 MG PO TABS
100.0000 mg | ORAL_TABLET | Freq: Two times a day (BID) | ORAL | Status: DC
  Administered 2020-04-29 – 2020-05-01 (×5): 100 mg via ORAL
  Filled 2020-04-29 (×4): qty 2
  Filled 2020-04-29 (×5): qty 1

## 2020-04-29 MED ORDER — GLUCERNA SHAKE PO LIQD
237.0000 mL | Freq: Three times a day (TID) | ORAL | Status: DC
  Administered 2020-04-29 – 2020-05-01 (×4): 237 mL via ORAL

## 2020-04-29 MED ORDER — ENOXAPARIN SODIUM 40 MG/0.4ML ~~LOC~~ SOLN
40.0000 mg | SUBCUTANEOUS | Status: DC
  Administered 2020-04-29: 40 mg via SUBCUTANEOUS
  Filled 2020-04-29: qty 0.4

## 2020-04-29 MED ORDER — SODIUM CHLORIDE 0.9 % IV SOLN
100.0000 mg | INTRAVENOUS | Status: AC
  Administered 2020-04-29: 100 mg via INTRAVENOUS
  Filled 2020-04-29: qty 20

## 2020-04-29 MED ORDER — LEVOTHYROXINE SODIUM 75 MCG PO TABS
75.0000 ug | ORAL_TABLET | Freq: Every day | ORAL | Status: DC
  Administered 2020-04-29 – 2020-05-01 (×3): 75 ug via ORAL
  Filled 2020-04-29 (×2): qty 1
  Filled 2020-04-29: qty 2

## 2020-04-29 MED ORDER — POTASSIUM CHLORIDE 10 MEQ/100ML IV SOLN
10.0000 meq | INTRAVENOUS | Status: AC
  Administered 2020-04-29: 10 meq via INTRAVENOUS
  Filled 2020-04-29 (×2): qty 100

## 2020-04-29 MED ORDER — WARFARIN - PHARMACIST DOSING INPATIENT: Freq: Every day | Status: DC

## 2020-04-29 MED ORDER — SODIUM CHLORIDE 0.9 % IV SOLN: 200.0000 mg | Freq: Once | INTRAVENOUS | Status: DC

## 2020-04-29 MED ORDER — SODIUM CHLORIDE 0.9 % IV SOLN: 100.0000 mg | Freq: Every day | INTRAVENOUS | Status: DC

## 2020-04-29 MED ORDER — ATORVASTATIN CALCIUM 10 MG PO TABS
20.0000 mg | ORAL_TABLET | ORAL | Status: DC
  Administered 2020-04-29: 20 mg via ORAL
  Filled 2020-04-29: qty 2

## 2020-04-29 MED ORDER — GUAIFENESIN-DM 100-10 MG/5ML PO SYRP: 10.0000 mL | ORAL_SOLUTION | ORAL | Status: DC | PRN

## 2020-04-29 MED ORDER — HYDROCOD POLST-CPM POLST ER 10-8 MG/5ML PO SUER: 5.0000 mL | Freq: Two times a day (BID) | ORAL | Status: DC | PRN

## 2020-04-29 MED ORDER — SODIUM CHLORIDE 0.9 % IV SOLN
100.0000 mg | Freq: Once | INTRAVENOUS | Status: AC
  Administered 2020-04-29: 100 mg via INTRAVENOUS
  Filled 2020-04-29: qty 20

## 2020-04-29 MED ORDER — GABAPENTIN 100 MG PO CAPS
100.0000 mg | ORAL_CAPSULE | Freq: Three times a day (TID) | ORAL | Status: DC
  Administered 2020-04-29 – 2020-05-01 (×8): 100 mg via ORAL
  Filled 2020-04-29 (×8): qty 1

## 2020-04-29 MED ORDER — INSULIN ASPART 100 UNIT/ML ~~LOC~~ SOLN
0.0000 [IU] | Freq: Three times a day (TID) | SUBCUTANEOUS | Status: DC
  Administered 2020-04-29: 11 [IU] via SUBCUTANEOUS
  Administered 2020-04-29: 5 [IU] via SUBCUTANEOUS
  Administered 2020-04-29: 3 [IU] via SUBCUTANEOUS
  Administered 2020-04-30: 15 [IU] via SUBCUTANEOUS
  Administered 2020-04-30: 2 [IU] via SUBCUTANEOUS
  Administered 2020-04-30: 11 [IU] via SUBCUTANEOUS
  Administered 2020-05-01 (×2): 8 [IU] via SUBCUTANEOUS
  Administered 2020-05-01: 3 [IU] via SUBCUTANEOUS
  Filled 2020-04-29 (×3): qty 1

## 2020-04-29 MED ORDER — CITALOPRAM HYDROBROMIDE 20 MG PO TABS
20.0000 mg | ORAL_TABLET | Freq: Every day | ORAL | Status: DC
  Administered 2020-04-29 – 2020-05-01 (×3): 20 mg via ORAL
  Filled 2020-04-29 (×5): qty 1

## 2020-04-29 MED ORDER — METHYLPREDNISOLONE SODIUM SUCC 40 MG IJ SOLR
40.0000 mg | Freq: Two times a day (BID) | INTRAMUSCULAR | Status: DC
  Administered 2020-04-29 – 2020-05-01 (×5): 40 mg via INTRAVENOUS
  Filled 2020-04-29 (×5): qty 1

## 2020-04-29 MED ORDER — DEXAMETHASONE SODIUM PHOSPHATE 10 MG/ML IJ SOLN
6.0000 mg | INTRAMUSCULAR | Status: DC
  Administered 2020-04-29: 6 mg via INTRAVENOUS
  Filled 2020-04-29: qty 1

## 2020-04-29 MED ORDER — LEVOTHYROXINE SODIUM 50 MCG PO TABS: 75.0000 ug | ORAL_TABLET | Freq: Every day | ORAL | Status: DC

## 2020-04-29 NOTE — ED Notes (Signed)
Pt provided with meal tray at this time 

## 2020-04-29 NOTE — Progress Notes (Signed)
ANTICOAGULATION CONSULT NOTE - Initial Consult  Pharmacy Consult for warfarin Indication: atrial fibrillation  Allergies  Allergen Reactions  . Other Hives    Florinal for headaches.  . Penicillins Rash  . Tetanus Toxoids Rash    Patient Measurements: Height: 5\' 6"  (167.6 cm) Weight: 80.7 kg (178 lb) IBW/kg (Calculated) : 59.3 Heparin Dosing Weight:   Vital Signs: Temp: 97.8 F (36.6 C) (12/06 1123) Temp Source: Oral (12/06 1123) BP: 153/56 (12/06 1123) Pulse Rate: 60 (12/06 1123)  Labs: Recent Labs    04/28/20 1929 04/29/20 0539  HGB 12.8 12.4  HCT 38.9 37.9  PLT 191 191  APTT  --  55*  LABPROT  --  25.0*  INR  --  2.4*  CREATININE 1.26* 1.17*    Estimated Creatinine Clearance: 43.2 mL/min (A) (by C-G formula based on SCr of 1.17 mg/dL (H)).   Medical History: Past Medical History:  Diagnosis Date  . A-fib ([place])   . Chronic kidney disease   . Colon polyps   . CVA (cerebral vascular accident) ([place])   . Depression   . DM (diabetes mellitus) ([place])   . GERD (gastroesophageal reflux disease)   . HTN (hypertension)   . Hypercholesterolemia   . Hypothyroidism   . Pancreatitis    biliary  . S/P colonoscopy March 2006   Dr. Olevia Perches: per e-chart report: diverticulosis, path adenomatous polyps  . S/P endoscopy March 2009   multiple 3-4 mmsessile gastric polyps, benign, singl antral erosions, negative H.pylori, reactive gastropathy ? NSAIDs  . 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  Patient's INR on admission is therapeutic at 2.4.  Home dose listed as 5 mg MWF and 2.5 mg ROW.  Goal of Therapy:  INR 2-3 Monitor platelets by anticoagulation protocol: Yes   Plan:  Warfarin 5 mg x 1 dose. Monitor daily INR and s/s of bleeding.  Ramond Craver 04/29/2020,12:52 PM

## 2020-04-29 NOTE — ED Notes (Signed)
Pt up and ambulatory to the restroom with assistance from staff.

## 2020-04-29 NOTE — ED Notes (Signed)

## 2020-04-29 NOTE — ED Notes (Signed)
Pt ambulated to bathroom with no issues. Pt sats were 92% on RA after walking. Pt c/o of being SOB. Pt placed back on 2L and now sating 97%.

## 2020-04-29 NOTE — ED Notes (Signed)
Handoff report received by Luiz Iron, RN.

## 2020-04-29 NOTE — Progress Notes (Signed)
PROGRESS NOTE  TRANISE [name] HUD:[id number] DOB: 12/13/1940 DOA: 04/28/2020 PCP: Celene Squibb, MD  Brief History:  79 year old female with a history of atrial fibrillation, stroke, diabetes mellitus, hypertension, hyperlipidemia, hypothyroidism presenting with 1 week history of generalized weakness, coughing, and progressive shortness of breath.  Patient was diagnosed with positive Covid test on 04/23/2020 by her PCP.  She stated that her symptoms have began approximately 2 to 3 days prior to the test.  Since that period of time, the patient has continued to have low-grade temperatures, nonproductive cough, and worsening shortness of breath.  She has been vaccinated for COVID-19.  She has some nausea without any emesis.  She denied any headache, chest pain, diarrhea, abdominal pain, dysuria, hematuria. In the emergency department, the patient had a low-grade temperature of 99.8 F.  She was hemodynamically stable.  Oxygen saturation was in the mid 80s on room air with ambulation.  Patient was started on remdesivir and steroids.  Chest x-ray showed patchy infiltrates, recurrent on the left.  There was increased interstitial markings.  Assessment/Plan: Acute respiratory failure with hypoxia secondary to COVID-19 -Currently stable on 2 L nasal cannula -Oxygen saturations 96-100% on 3 L -Wean oxygen as tolerated -CRP 23.9>> -Ferritin 743 -D-dimer 0.1 -PCT<0.10  Sick sinus syndrome -Status post PPM -follows Dr. [name]  Atrial fibrillation, paroxysmal -Currently in sinus rhythm -Continue warfarin  Hyperlipidemia -holding statin due to elevated LFTs  Diabetes mellitus type 2 -Holding glipizide -NovoLog sliding scale -check A1C  Hypokalemia -Replete -Check magnesium  Hypothyroidism -Continue levothyroxine  Hypokalemia -replete -check mag--1.8  Transaminasemia -due to COVID-19 infection -RUQ ultrasound -holding statin   Status is: Inpatient  Remains inpatient  appropriate because:IV treatments appropriate due to intensity of illness or inability to take PO   Dispo: The patient is from: Home              Anticipated d/c is to: Home              Anticipated d/c date is: 2 days              Patient currently is not medically stable to d/c.        Family Communication:  no Family at bedside  Consultants:  none  Code Status:  FULL   DVT Prophylaxis: [place] Lovenox   Procedures: As Listed in Progress Note Above  Antibiotics: None       Subjective: Patient breathing a little better but remains sob with minimal exertion.  Denies f/c, cp, v/d.  Complains of nausea.  No abd pain or dysuria  Objective: Vitals:   04/29/20 0600 04/29/20 0800 04/29/20 0831 04/29/20 0900  BP: (!) 165/65 (!) 160/64  (!) 145/64  Pulse: 64 61  60  Resp: 17 18  15   Temp:   97.6 F (36.4 C)   TempSrc:   Oral   SpO2: 99% 100%  96%  Weight:      Height:       No intake or output data in the 24 hours ending 04/29/20 0946 Weight change:  Exam:   General:  Pt is alert, follows commands appropriately, not in acute distress  HEENT: No icterus, No thrush, No neck mass, [place]/AT  Cardiovascular: RRR, S1/S2, no rubs, no gallops  Respiratory: bibasilar rales. No wheeze  Abdomen: Soft/+BS, non tender, non distended, no guarding  Extremities: No edema, No lymphangitis, No petechiae, No rashes, no synovitis   Data Reviewed: I  have personally reviewed following labs and imaging studies Basic Metabolic Panel: Recent Labs  Lab 04/28/20 1929 04/29/20 0539  NA 137 136  K 2.7* 3.1*  CL 96* 100  CO2 28 23  GLUCOSE 124* 181*  BUN 20 24*  CREATININE 1.26* 1.17*  CALCIUM 8.5* 8.3*  MG  --  1.8  PHOS  --  3.8   Liver Function Tests: Recent Labs  Lab 04/28/20 1929 04/29/20 0539  AST 155* 183*  ALT 120* 142*  ALKPHOS 91 88  BILITOT 1.0 1.1  PROT 7.2 6.8  ALBUMIN 3.1* 2.9*   No results for input(s): LIPASE, AMYLASE in the last 168 hours. No  results for input(s): AMMONIA in the last 168 hours. Coagulation Profile: Recent Labs  Lab 04/29/20 0539  INR 2.4*   CBC: Recent Labs  Lab 04/28/20 1929 04/29/20 0539  WBC 10.4 9.6  NEUTROABS 8.0*  --   HGB 12.8 12.4  HCT 38.9 37.9  MCV 99.2 101.3*  PLT 191 191   Cardiac Enzymes: No results for input(s): CKTOTAL, CKMB, CKMBINDEX, TROPONINI in the last 168 hours. BNP: Invalid input(s): POCBNP CBG: Recent Labs  Lab 04/29/20 0805  GLUCAP 191*   HbA1C: No results for input(s): HGBA1C in the last 72 hours. Urine analysis:    Component Value Date/Time   COLORURINE YELLOW 08/21/2010 2316   APPEARANCEUR CLEAR 08/21/2010 2316   LABSPEC 1.010 08/21/2010 2316   PHURINE 7.5 08/21/2010 2316   GLUCOSEU NEGATIVE 08/21/2010 2316   HGBUR TRACE (A) 08/21/2010 2316   BILIRUBINUR NEGATIVE 08/21/2010 2316   KETONESUR NEGATIVE 08/21/2010 2316   PROTEINUR NEGATIVE 08/21/2010 2316   UROBILINOGEN 0.2 08/21/2010 2316   NITRITE NEGATIVE 08/21/2010 2316   LEUKOCYTESUR NEGATIVE 08/21/2010 2316   Sepsis Labs: @LABRCNTIP (procalcitonin:4,lacticidven:4) ) Recent Results (from the past 240 hour(s))  Blood Culture (routine x 2)     Status: None (Preliminary result)   Collection Time: 04/28/20  7:29 PM   Specimen: BLOOD LEFT ARM  Result Value Ref Range Status   Specimen Description BLOOD LEFT ARM  Final   Special Requests   Final    BOTTLES DRAWN AEROBIC AND ANAEROBIC Blood Culture adequate volume   Culture   Final    NO GROWTH < 24 HOURS Performed at [facility], [address], [place], [place] [address]    Report Status PENDING  Incomplete  Blood Culture (routine x 2)     Status: None (Preliminary result)   Collection Time: 04/28/20 11:33 PM   Specimen: BLOOD LEFT HAND  Result Value Ref Range Status   Specimen Description BLOOD LEFT HAND  Final   Special Requests   Final    BOTTLES DRAWN AEROBIC AND ANAEROBIC Blood Culture adequate volume   Culture   Final    NO GROWTH < 12  HOURS Performed at [facility], [address] [place], [place] [address]    Report Status PENDING  Incomplete     Scheduled Meds: . albuterol  2 puff Inhalation Q6H  . atorvastatin  20 mg Oral QODAY  . dexamethasone (DECADRON) injection  6 mg Intravenous Q24H  . dextromethorphan-guaiFENesin  1 tablet Oral BID  . enoxaparin (LOVENOX) injection  40 mg Subcutaneous Q24H  . feeding supplement (GLUCERNA SHAKE)  237 mL Oral TID BM  . insulin aspart  0-15 Units Subcutaneous TID WC  . insulin aspart  0-5 Units Subcutaneous QHS  . levothyroxine  75 mcg Oral Q0600  . pantoprazole  40 mg Oral Daily  . triamcinolone acetonide  10 mg Other Once   Continuous Infusions: . [START ON 04/30/2020] remdesivir 100 mg in NS 100 mL    . remdesivir 100 mg in NS 100 mL      Procedures/Studies: DG Chest Port 1 View  Result Date: 04/28/2020 CLINICAL DATA:  COVID-19 positive 03/23/2020, shortness of breath and fever EXAM: PORTABLE CHEST 1 VIEW COMPARISON:  Radiograph 02/15/2018 FINDINGS: Multifocal patchy opacities are present throughout both lungs in a basilar and peripheral predominance which appears superimposed on more chronic interstitial changes and low lung volumes. No pneumothorax or effusion. Accessory azygos fissure in tortuosity of the brachiocephalic vasculature are similar to prior. Pacer pack overlies left chest wall with leads in stable position at the right atrium and cardiac apex. The aorta is calcified. The remaining cardiomediastinal contours are unremarkable. No acute osseous or soft tissue abnormality. Degenerative changes are present in the imaged spine and shoulders. Prior left shoulder hemiarthroplasty appears high-riding, likely related to the rotator cuff insufficiency. IMPRESSION: Multifocal patchy opacities throughout both lungs in a basilar and peripheral predominance. Findings are compatible with multifocal pneumonia in the setting of COVID-19. Superimposed on more chronic interstitial  changes and low lung volumes. Aortic Atherosclerosis (ICD10-I70.0). Electronically Signed   By: Lovena Le M.D.   On: 04/28/2020 19:48    Orson Eva, DO  [facility]  If 7PM-7AM, please contact night-coverage [email or web address] Password [email or web address] 04/29/2020, 9:46 AM   LOS: 1 day

## 2020-04-29 NOTE — Progress Notes (Signed)
ANTICOAGULATION CONSULT NOTE - Initial Consult  Pharmacy Consult for warfarin Indication: atrial fibrillation  Allergies  Allergen Reactions  . Other Hives    Florinal for headaches.  . Penicillins Rash  . Tetanus Toxoids Rash    Patient Measurements: Height: 5\' 6"  (167.6 cm) Weight: 80.7 kg (178 lb) IBW/kg (Calculated) : 59.3 Heparin Dosing Weight:   Vital Signs: Temp: 97.8 F (36.6 C) (12/06 1123) Temp Source: Oral (12/06 1123) BP: 142/60 (12/06 1400) Pulse Rate: 59 (12/06 1400)  Labs: Recent Labs    04/28/20 1929 04/29/20 0539  HGB 12.8 12.4  HCT 38.9 37.9  PLT 191 191  APTT  --  55*  LABPROT  --  25.0*  INR  --  2.4*  CREATININE 1.26* 1.17*    Estimated Creatinine Clearance: 43.2 mL/min (A) (by C-G formula based on SCr of 1.17 mg/dL (H)).   Medical History: Past Medical History:  Diagnosis Date  . A-fib ([place])   . Chronic kidney disease   . Colon polyps   . CVA (cerebral vascular accident) ([place])   . Depression   . DM (diabetes mellitus) ([place])   . GERD (gastroesophageal reflux disease)   . HTN (hypertension)   . Hypercholesterolemia   . Hypothyroidism   . Pancreatitis    biliary  . S/P colonoscopy March 2006   Dr. Olevia Perches: per e-chart report: diverticulosis, path adenomatous polyps  . S/P endoscopy March 2009   multiple 3-4 mmsessile gastric polyps, benign, singl antral erosions, negative H.pylori, reactive gastropathy ? NSAIDs  . 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  Patient's INR on admission is 2.4 with last dose given 12/4.  Patient's home dose listed as 5 mg MWF and 2.5 mg ROW.  Goal of Therapy:  INR 2-3 Monitor platelets by anticoagulation protocol: Yes   Plan:  Warfarin 5 mg x 1 dose. Monitor daily INR and s/s of bleeding.  Revonda Standard Macen Joslin 04/29/2020,2:47 PM

## 2020-04-30 DIAGNOSIS — J9601 Acute respiratory failure with hypoxia: Secondary | ICD-10-CM

## 2020-04-30 DIAGNOSIS — R7401 Elevation of levels of liver transaminase levels: Secondary | ICD-10-CM

## 2020-04-30 LAB — CBC WITH DIFFERENTIAL/PLATELET
Abs Immature Granulocytes: 0.1 10*3/uL — ABNORMAL HIGH (ref 0.00–0.07)
Basophils Absolute: 0 10*3/uL (ref 0.0–0.1)
Basophils Relative: 0 %
Eosinophils Absolute: 0 10*3/uL (ref 0.0–0.5)
Eosinophils Relative: 0 %
HCT: 35.8 % — ABNORMAL LOW (ref 36.0–46.0)
Hemoglobin: 11.7 g/dL — ABNORMAL LOW (ref 12.0–15.0)
Immature Granulocytes: 1 %
Lymphocytes Relative: 4 %
Lymphs Abs: 0.7 10*3/uL (ref 0.7–4.0)
MCH: 32.6 pg (ref 26.0–34.0)
MCHC: 32.7 g/dL (ref 30.0–36.0)
MCV: 99.7 fL (ref 80.0–100.0)
Monocytes Absolute: 0.3 10*3/uL (ref 0.1–1.0)
Monocytes Relative: 2 %
Neutro Abs: 16.2 10*3/uL — ABNORMAL HIGH (ref 1.7–7.7)
Neutrophils Relative %: 93 %
Platelets: 220 10*3/uL (ref 150–400)
RBC: 3.59 MIL/uL — ABNORMAL LOW (ref 3.87–5.11)
RDW: 13.8 % (ref 11.5–15.5)
WBC: 17.4 10*3/uL — ABNORMAL HIGH (ref 4.0–10.5)
nRBC: 0 % (ref 0.0–0.2)

## 2020-04-30 LAB — GLUCOSE, CAPILLARY
Glucose-Capillary: 330 mg/dL — ABNORMAL HIGH (ref 70–99)
Glucose-Capillary: 353 mg/dL — ABNORMAL HIGH (ref 70–99)
Glucose-Capillary: 142 mg/dL — ABNORMAL HIGH (ref 70–99)
Glucose-Capillary: 250 mg/dL — ABNORMAL HIGH (ref 70–99)

## 2020-04-30 LAB — COMPREHENSIVE METABOLIC PANEL
ALT: 251 U/L — ABNORMAL HIGH (ref 0–44)
AST: 298 U/L — ABNORMAL HIGH (ref 15–41)
Albumin: 2.7 g/dL — ABNORMAL LOW (ref 3.5–5.0)
Alkaline Phosphatase: 95 U/L (ref 38–126)
Anion gap: 12 (ref 5–15)
BUN: 37 mg/dL — ABNORMAL HIGH (ref 8–23)
CO2: 23 mmol/L (ref 22–32)
Calcium: 8.9 mg/dL (ref 8.9–10.3)
Chloride: 100 mmol/L (ref 98–111)
Creatinine, Ser: 1.15 mg/dL — ABNORMAL HIGH (ref 0.44–1.00)
GFR, Estimated: 49 mL/min — ABNORMAL LOW (ref >60)
Glucose, Bld: 344 mg/dL — ABNORMAL HIGH (ref 70–99)
Potassium: 3.3 mmol/L — ABNORMAL LOW (ref 3.5–5.1)
Sodium: 135 mmol/L (ref 135–145)
Total Bilirubin: 0.7 mg/dL (ref 0.3–1.2)
Total Protein: 6.7 g/dL (ref 6.5–8.1)

## 2020-04-30 LAB — PROTIME-INR
INR: 2.6 — ABNORMAL HIGH (ref 0.8–1.2)
Prothrombin Time: 27.1 seconds — ABNORMAL HIGH (ref 11.4–15.2)

## 2020-04-30 LAB — PHOSPHORUS: Phosphorus: 2.8 mg/dL (ref 2.5–4.6)

## 2020-04-30 LAB — D-DIMER, QUANTITATIVE: D-Dimer, Quant: 0.65 ug/mL-FEU — ABNORMAL HIGH (ref 0.00–0.50)

## 2020-04-30 LAB — FERRITIN: Ferritin: 1497 ng/mL — ABNORMAL HIGH (ref 11–307)

## 2020-04-30 LAB — C-REACTIVE PROTEIN: CRP: 18.5 mg/dL — ABNORMAL HIGH (ref <1.0)

## 2020-04-30 LAB — MAGNESIUM: Magnesium: 1.9 mg/dL (ref 1.7–2.4)

## 2020-04-30 MED ORDER — INSULIN DETEMIR 100 UNIT/ML ~~LOC~~ SOLN
10.0000 [IU] | Freq: Every day | SUBCUTANEOUS | Status: DC
  Administered 2020-04-30 – 2020-05-01 (×2): 10 [IU] via SUBCUTANEOUS
  Filled 2020-04-30 (×3): qty 0.1

## 2020-04-30 MED ORDER — ALBUTEROL SULFATE HFA 108 (90 BASE) MCG/ACT IN AERS: 2.0000 | INHALATION_SPRAY | Freq: Four times a day (QID) | RESPIRATORY_TRACT | Status: DC | PRN

## 2020-04-30 MED ORDER — POTASSIUM CHLORIDE CRYS ER 20 MEQ PO TBCR
20.0000 meq | EXTENDED_RELEASE_TABLET | Freq: Once | ORAL | Status: AC
  Administered 2020-04-30: 20 meq via ORAL
  Filled 2020-04-30: qty 1

## 2020-04-30 MED ORDER — INSULIN ASPART 100 UNIT/ML ~~LOC~~ SOLN
4.0000 [IU] | Freq: Three times a day (TID) | SUBCUTANEOUS | Status: DC
  Administered 2020-04-30 – 2020-05-01 (×5): 4 [IU] via SUBCUTANEOUS

## 2020-04-30 MED ORDER — WARFARIN SODIUM 2.5 MG PO TABS
2.5000 mg | ORAL_TABLET | Freq: Once | ORAL | Status: AC
  Administered 2020-04-30: 2.5 mg via ORAL
  Filled 2020-04-30: qty 1

## 2020-04-30 NOTE — Plan of Care (Signed)
  Problem: Acute Rehab PT Goals(only PT should resolve) Goal: Pt Will Go Supine/Side To Sit Outcome: Progressing Flowsheets (Taken 04/30/2020 1451) Pt will go Supine/Side to Sit:  Independently  with modified independence Goal: Patient Will Transfer Sit To/From Stand Outcome: Progressing Flowsheets (Taken 04/30/2020 1451) Patient will transfer sit to/from stand:  with modified independence  Independently Goal: Pt Will Transfer Bed To Chair/Chair To Bed Outcome: Progressing Flowsheets (Taken 04/30/2020 1451) Pt will Transfer Bed to Chair/Chair to Bed:  with modified independence  Independently Goal: Pt Will Ambulate Outcome: Progressing Flowsheets (Taken 04/30/2020 1451) Pt will Ambulate:  50 feet  with supervision  with modified independence  with least restrictive assistive device   2:52 PM, 04/30/20 Lonell Grandchild, MPT Physical Therapist with [facility] [phone] office [phone] mobile phone

## 2020-04-30 NOTE — Evaluation (Signed)
Physical Therapy Evaluation Patient Details Name: [name] MRN: [id number] DOB: 11/26/1940 Today's Date: 04/30/2020   History of Present Illness  [name] is a 79 y.o. female with medical history significant for hyperlipidemia, gout, hypothyroidism, type 2 diabetes mellitus who presents to the emergency department due to persistent fever.  Patient complained of cough with production of grayish mucus (occasionally bloodstained), fever that has been as high as 102F, but usually around 100.47F which started on November 23, she had a positive Covid test on 11/30.  Patient complained of decreased appetite with loss of taste without loss of smell.  She complained of some shortness of breath mostly when she moves around, but she denies chest pain, headache, fever, abdominal pain.Patient has had 2 doses of Moderna virus vaccine    Clinical Impression  Patient functioning near baseline for functional mobility and gait, demonstrates good return for ambulating to bathroom, transferring to commode and standing in front of sink to wash hands without loss of balance.  Patient limited for ambulation mostly due to c/o fatigue and mild SOB.  Patient tolerated sitting up in chair after therapy - RN aware.  Patient will benefit from continued physical therapy in hospital and recommended venue below to increase strength, balance, endurance for safe ADLs and gait.      Follow Up Recommendations Home health PT;Supervision - Intermittent    Equipment Recommendations  None recommended by PT    Recommendations for Other Services       Precautions / Restrictions Precautions Precautions: Fall Restrictions Weight Bearing Restrictions: No      Mobility  Bed Mobility Overal bed mobility: Modified Independent             General bed mobility comments: increased time, slightly labored movement    Transfers Overall transfer level: Modified independent Equipment used: None              General transfer comment: good return for transferring to commode in bathroom and chair at bedside  Ambulation/Gait Ambulation/Gait assistance: Supervision Gait Distance (Feet): 20 Feet Assistive device: None Gait Pattern/deviations: Decreased step length - right;Decreased step length - left;Decreased stride length Gait velocity: decreased   General Gait Details: slightly unsteady cadence without loss of balance, limited mostly due to c/o fatigue and mild SOB  Stairs            Wheelchair Mobility    Modified Rankin (Stroke Patients Only)       Balance Overall balance assessment: Mild deficits observed, not formally tested                                           Pertinent Vitals/Pain Pain Assessment: No/denies pain    Home Living Family/patient expects to be discharged to:: Private residence Living Arrangements: Alone Available Help at Discharge: Neighbor;Friend(s);Available PRN/intermittently Type of Home: Mobile home Home Access: Stairs to enter Entrance Stairs-Rails: Right;Left;Can reach both Entrance Stairs-Number of Steps: 5 Home Layout: One level Home Equipment: Cane - single point      Prior Function Level of Independence: Needs assistance   Gait / Transfers Assistance Needed: household and short distanced ambulator without AD  ADL's / Homemaking Assistance Needed: neighbors assist with some community ADLs        Hand Dominance        Extremity/Trunk Assessment   Upper Extremity Assessment Upper Extremity Assessment: Overall WFL for tasks  assessed    Lower Extremity Assessment Lower Extremity Assessment: Generalized weakness    Cervical / Trunk Assessment Cervical / Trunk Assessment: Normal  Communication   Communication: No difficulties  Cognition Arousal/Alertness: Awake/alert Behavior During Therapy: WFL for tasks assessed/performed Overall Cognitive Status: Within Functional Limits for tasks assessed                                         General Comments      Exercises     Assessment/Plan    PT Assessment Patient needs continued PT services  PT Problem List Decreased strength;Decreased activity tolerance;Decreased balance;Decreased mobility       PT Treatment Interventions DME instruction;Gait training;Stair training;Functional mobility training;Therapeutic activities;Therapeutic exercise;Patient/family education;Balance training    PT Goals (Current goals can be found in the Care Plan section)  Acute Rehab PT Goals Patient Stated Goal: return home with neighbors to assist PT Goal Formulation: With patient Time For Goal Achievement: 05/07/20 Potential to Achieve Goals: Good    Frequency Min 2X/week   Barriers to discharge        Co-evaluation               AM-PAC PT "6 Clicks" Mobility  Outcome Measure Help needed turning from your back to your side while in a flat bed without using bedrails?: None Help needed moving from lying on your back to sitting on the side of a flat bed without using bedrails?: None Help needed moving to and from a bed to a chair (including a wheelchair)?: None Help needed standing up from a chair using your arms (e.g., wheelchair or bedside chair)?: None Help needed to walk in hospital room?: A Little Help needed climbing 3-5 steps with a railing? : A Little 6 Click Score: 22    End of Session Equipment Utilized During Treatment: Oxygen Activity Tolerance: Patient tolerated treatment well;Patient limited by fatigue Patient left: in chair;with call bell/phone within reach Nurse Communication: Mobility status PT Visit Diagnosis: Unsteadiness on feet (R26.81);Other abnormalities of gait and mobility (R26.89);Muscle weakness (generalized) (M62.81)    Time: 1152-1209 PT Time Calculation (min) (ACUTE ONLY): 17 min   Charges:   PT Evaluation $PT Eval Low Complexity: 1 Low PT Treatments $Therapeutic Activity: 8-22 mins         2:50 PM, 04/30/20 Lonell Grandchild, MPT Physical Therapist with [facility] [phone] office [phone] mobile phone

## 2020-04-30 NOTE — Progress Notes (Signed)
ANTICOAGULATION CONSULT NOTE -   Pharmacy Consult for warfarin Indication: atrial fibrillation  Allergies  Allergen Reactions   Other Hives    Florinal for headaches.   Penicillins Rash   Tetanus Toxoids Rash    Patient Measurements: Height: 5\' 6"  (167.6 cm) Weight: 83.3 kg (183 lb 10.3 oz) IBW/kg (Calculated) : 59.3  Vital Signs: Temp: 98.3 F (36.8 C) (12/07 0806) Temp Source: Oral (12/07 0806) BP: 139/72 (12/07 0806) Pulse Rate: 64 (12/07 0806)  Labs: Recent Labs    04/28/20 1929 04/28/20 1929 04/29/20 0539 04/30/20 0419  HGB 12.8   < > 12.4 11.7*  HCT 38.9  --  37.9 35.8*  PLT 191  --  191 220  APTT  --   --  55*  --   LABPROT  --   --  25.0* 27.1*  INR  --   --  2.4* 2.6*  CREATININE 1.26*  --  1.17* 1.15*   < > = values in this interval not displayed.    Estimated Creatinine Clearance: 44.6 mL/min (A) (by C-G formula based on SCr of 1.15 mg/dL (H)).   Medical History: Past Medical History:  Diagnosis Date   A-fib [facility])    Chronic kidney disease    Colon polyps    CVA (cerebral vascular accident) ([facility])    Depression    DM (diabetes mellitus) ([place])    GERD (gastroesophageal reflux disease)    HTN (hypertension)    Hypercholesterolemia    Hypothyroidism    Pancreatitis    biliary   S/P colonoscopy March 2006   Dr. Olevia Perches: per e-chart report: diverticulosis, path adenomatous polyps   S/P endoscopy March 2009   multiple 3-4 mmsessile gastric polyps, benign, singl antral erosions, negative H.pylori, reactive gastropathy ? 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 Every 4-6 hours as needed   Past Week at Unknown time   albuterol (VENTOLIN HFA) 108 (90 Base) MCG/ACT inhaler Inhale into the lungs.   04/28/2020 at Unknown time   allopurinol (ZYLOPRIM) 100 MG tablet Take 200 mg by mouth daily.   04/26/2020 at 0900   atorvastatin (LIPITOR) 40 MG tablet Take 20 mg by mouth every other day.    Past Week at Unknown time   calcitRIOL (ROCALTROL) 0.25 MCG capsule Take 2 tablets by mouth every other day and take 1 tablet daily on the other days   Past Week at Unknown time   Cholecalciferol (VITAMIN D) 2000 units CAPS Take 2,000 Units by mouth daily.   Past Week at Unknown time   citalopram (CELEXA) 20 MG tablet Take 20 mg by mouth daily.     Past Week at Unknown time   DHA-EPA-Flaxseed Oil-Vitamin E CAPS Take 1 capsule by mouth 2 (two) times daily.   Past Week at Unknown time   flecainide (TAMBOCOR) 100 MG tablet Take 1 tablet (100 mg total) by mouth 2 (two) times daily. 180 tablet 3 Past Week at Unknown time   furosemide (LASIX) 80 MG tablet Take 2 tablets (160 mg total)  by mouth 2 (two) times daily. 180 tablet 3 Past Week at Unknown time   gabapentin (NEURONTIN) 100 MG capsule Take 1 capsule (100 mg total) by mouth 3 (three) times daily. 90 capsule 5 Past Week at Unknown time   glipiZIDE (GLUCOTROL XL) 10 MG 24 hr tablet Take 10 mg by mouth daily.     Past Week at Unknown time   HYDROcodone-acetaminophen (NORCO) 7.5-325 MG tablet Take 1 tablet by mouth 3 (three) times daily as needed.   Past Week at Unknown time   levothyroxine (SYNTHROID, LEVOTHROID) 75 MCG tablet Take 75 mcg by mouth daily.     Past Week at Unknown time   vitamin B-12 (CYANOCOBALAMIN) 1000 MCG tablet Take 5,000 mcg by mouth daily.   Past Week at Unknown time   warfarin (COUMADIN) 5 MG tablet 5mg  Mon, Wed, Fri, Sat; 2.5mg  other days   Past Week at 1730   azithromycin (ZITHROMAX) 250 MG tablet Take  250 mg by mouth as directed. (Patient not taking: Reported on 04/29/2020)   Completed Course at Unknown time   fluticasone (FLONASE) 50 MCG/ACT nasal spray Place 2 sprays into the nose daily as needed. For congestion (Patient not taking: Reported on 04/29/2020)   Not Taking at Unknown time   potassium chloride SA (K-DUR,KLOR-CON) 20 MEQ tablet Take 1 tablet by mouth as needed.  (Patient not taking: Reported on 04/29/2020)   Not Taking at Unknown time    Assessment: Pharmacy consulted to dose warfarin in patient with atrial fibrillation.  Patient's INR on admission is 2.4 with last dose given 12/4.  Patient's home dose listed as 5 mg MWF and 2.5 mg ROW. INR 2.6, therapeutic  Goal of Therapy:  INR 2-3 Monitor platelets by anticoagulation protocol: Yes   Plan:  Warfarin 2.5 mg x 1 dose today Monitor daily INR and s/s of bleeding.  Isac Sarna, BS Vena Austria, BCPS Clinical Pharmacist Pager [phone] 04/30/2020,10:44 AM

## 2020-04-30 NOTE — Progress Notes (Signed)
Inpatient Diabetes Program Recommendations  AACE/ADA: New Consensus Statement on Inpatient Glycemic Control   Target Ranges:  Prepandial:   less than 140 mg/dL      Peak postprandial:   less than 180 mg/dL (1-2 hours)      Critically ill patients:  140 - 180 mg/dL   Results for [name], [name] (MRN [id number]) as of 04/30/2020 08:18  Ref. Range 04/29/2020 08:05 04/29/2020 11:32 04/29/2020 18:20 04/29/2020 21:23 04/30/2020 07:58  Glucose-Capillary Latest Ref Range: 70 - 99 mg/dL 191 (H) 222 (H) 337 (H) 307 (H) 330 (H)   Review of Glycemic Control  Diabetes history: DM2 Outpatient Diabetes medications: Glipizide XL 10 mg daily Current orders for Inpatient glycemic control: Novolog 0-15 units TID with meals, Novolog 0-5 units QHS; Solumedrol 40 mg Q12H  Inpatient Diabetes Program Recommendations:    Insulin: If steroids are continued as ordered, please consider ordering Levemir 10 units Q24H and Novolog 4 units TID with meals for meal coverage if patient eats at least 50% of meals.  Thanks, Barnie Alderman, RN, MSN, CDE Diabetes Coordinator Inpatient Diabetes Program [phone] (Team Pager from 8am to 5pm)

## 2020-04-30 NOTE — Progress Notes (Addendum)
PROGRESS NOTE  [name] TKZ:[id number] DOB: 12-Sep-1940 DOA: 04/28/2020 PCP: Celene Squibb, MD  Brief History:  79 year old female with a history of atrial fibrillation, stroke, diabetes mellitus, hypertension, hyperlipidemia, hypothyroidism presenting with 1 week history of generalized weakness, coughing, and progressive shortness of breath.  Patient was diagnosed with positive Covid test on 04/23/2020 by her PCP.  She stated that her symptoms have began approximately 2 to 3 days prior to the test.  Since that period of time, the patient has continued to have low-grade temperatures, nonproductive cough, and worsening shortness of breath.  She has been vaccinated for COVID-19.  She has some nausea without any emesis.  She denied any headache, chest pain, diarrhea, abdominal pain, dysuria, hematuria. In the emergency department, the patient had a low-grade temperature of 99.8 F.  She was hemodynamically stable.  Oxygen saturation was in the mid 80s on room air with ambulation.  Patient was started on remdesivir and steroids.  Chest x-ray showed patchy infiltrates, recurrent on the left.  There was increased interstitial markings.  Assessment/Plan: Acute respiratory failure with hypoxia secondary to COVID-19 -Currently stable on 2 L nasal cannula>>1L -Wean oxygen as tolerated -CRP 23.9>>18.5 -Ferritin 743>>1497 -D-dimer 0.81>>0.651 -PCT<0.10 -d/c remdesivir due to rising LFTs -continue IV steroids  Sick sinus syndrome -Status post PPM -follows Dr. [name] -continue flecainide  Atrial fibrillation, paroxysmal -Currently in sinus rhythm -Continue warfarin  Hyperlipidemia -holding statin due to elevated LFTs  Diabetes mellitus type 2 -Holding glipizide -NovoLog sliding scale -check A1C--6.9 -add levemir 10 units daily -novolog 4 units with meals  CKD 3a -baseline creatinine 1.0-1.2  Hypokalemia -Repleted -Check magnesium--1.9  Hypothyroidism -Continue  levothyroxine  Hypokalemia -replete -check mag--1.8  Transaminasemia -due to COVID-19 infection -LFTs trending up-->stop remdesivir -hep B and C neg -RUQ ultrasound--steatosis -holding statin  Deconditioning -PT-->HHPT   Status is: Inpatient  Remains inpatient appropriate because:IV treatments appropriate due to intensity of illness or inability to take PO   Dispo: The patient is from: Home  Anticipated d/c is to: Home  Anticipated d/c date is: 1 days  Patient currently is not medically stable to d/c.        Family Communication:  no Family at bedside  Consultants:  none  Code Status:  FULL   DVT Prophylaxis: warfarin   Procedures: As Listed in Progress Note Above  Antibiotics: None        Subjective: Patient is breathing better.  She has nonproductive cough, a little better.  Denies n/v/d, abd pain.  No f/c, cp.  Objective: Vitals:   04/30/20 0414 04/30/20 0806 04/30/20 0840 04/30/20 1448  BP: 137/79 139/72  131/61  Pulse: 65 64  67  Resp:  17  20  Temp: (!) 97.3 F (36.3 C) 98.3 F (36.8 C)  98.1 F (36.7 C)  TempSrc: Oral Oral  Oral  SpO2: 96% 96% 97% 99%  Weight:      Height:        Intake/Output Summary (Last 24 hours) at 04/30/2020 1725 Last data filed at 04/30/2020 0500 Gross per 24 hour  Intake 240 ml  Output --  Net 240 ml   Weight change: 2.56 kg Exam:   General:  Pt is alert, follows commands appropriately, not in acute distress  HEENT: No icterus, No thrush, No neck mass, [place]/AT  Cardiovascular: RRR, S1/S2, no rubs, no gallops  Respiratory: bibasilar rales. No wheeze  Abdomen: Soft/+BS, non tender, non distended, no guarding  Extremities: No edema, No lymphangitis, No petechiae, No rashes, no synovitis   Data Reviewed: I have personally reviewed following labs and imaging studies Basic Metabolic Panel: Recent Labs  Lab 04/28/20 1929 04/29/20 0539  04/30/20 0419  NA 137 136 135  K 2.7* 3.1* 3.3*  CL 96* 100 100  CO2 28 23 23   GLUCOSE 124* 181* 344*  BUN 20 24* 37*  CREATININE 1.26* 1.17* 1.15*  CALCIUM 8.5* 8.3* 8.9  MG  --  1.8 1.9  PHOS  --  3.8 2.8   Liver Function Tests: Recent Labs  Lab 04/28/20 1929 04/29/20 0539 04/30/20 0419  AST 155* 183* 298*  ALT 120* 142* 251*  ALKPHOS 91 88 95  BILITOT 1.0 1.1 0.7  PROT 7.2 6.8 6.7  ALBUMIN 3.1* 2.9* 2.7*   No results for input(s): LIPASE, AMYLASE in the last 168 hours. No results for input(s): AMMONIA in the last 168 hours. Coagulation Profile: Recent Labs  Lab 04/29/20 0539 04/30/20 0419  INR 2.4* 2.6*   CBC: Recent Labs  Lab 04/28/20 1929 04/29/20 0539 04/30/20 0419  WBC 10.4 9.6 17.4*  NEUTROABS 8.0*  --  16.2*  HGB 12.8 12.4 11.7*  HCT 38.9 37.9 35.8*  MCV 99.2 101.3* 99.7  PLT 191 191 220   Cardiac Enzymes: No results for input(s): CKTOTAL, CKMB, CKMBINDEX, TROPONINI in the last 168 hours. BNP: Invalid input(s): POCBNP CBG: Recent Labs  Lab 04/29/20 1820 04/29/20 2123 04/30/20 0758 04/30/20 1119 04/30/20 1647  GLUCAP 337* 307* 330* 353* 142*   HbA1C: Recent Labs    04/29/20 0539  HGBA1C 6.9*   Urine analysis:    Component Value Date/Time   COLORURINE YELLOW 08/21/2010 2316   APPEARANCEUR CLEAR 08/21/2010 2316   LABSPEC 1.010 08/21/2010 2316   PHURINE 7.5 08/21/2010 2316   GLUCOSEU NEGATIVE 08/21/2010 2316   HGBUR TRACE (A) 08/21/2010 2316   BILIRUBINUR NEGATIVE 08/21/2010 2316   KETONESUR NEGATIVE 08/21/2010 2316   PROTEINUR NEGATIVE 08/21/2010 2316   UROBILINOGEN 0.2 08/21/2010 2316   NITRITE NEGATIVE 08/21/2010 2316   LEUKOCYTESUR NEGATIVE 08/21/2010 2316   Sepsis Labs: @LABRCNTIP (procalcitonin:4,lacticidven:4) ) Recent Results (from the past 240 hour(s))  Blood Culture (routine x 2)     Status: None (Preliminary result)   Collection Time: 04/28/20  7:29 PM   Specimen: BLOOD LEFT ARM  Result Value Ref Range Status    Specimen Description BLOOD LEFT ARM  Final   Special Requests   Final    BOTTLES DRAWN AEROBIC AND ANAEROBIC Blood Culture adequate volume   Culture   Final    NO GROWTH < 24 HOURS Performed at [facility], [address] [place], [place] [address]    Report Status PENDING  Incomplete  Blood Culture (routine x 2)     Status: None (Preliminary result)   Collection Time: 04/28/20 11:33 PM   Specimen: BLOOD LEFT HAND  Result Value Ref Range Status   Specimen Description BLOOD LEFT HAND  Final   Special Requests   Final    BOTTLES DRAWN AEROBIC AND ANAEROBIC Blood Culture adequate volume   Culture   Final    NO GROWTH < 12 HOURS Performed at [facility] [place], [address] [place], [place] [address]    Report Status PENDING  Incomplete  Resp Panel by RT-PCR (Flu A&B, Covid) Nasopharyngeal Swab     Status: Abnormal   Collection Time: 04/29/20 12:30 PM   Specimen: Nasopharyngeal Swab; Nasopharyngeal(NP) swabs in vial transport medium  Result Value Ref Range  Status   SARS Coronavirus 2 by RT PCR POSITIVE (A) NEGATIVE Final    Comment: RESULT CALLED TO, READ BACK BY AND VERIFIED WITH: DOSS,M. AT 1339 ON 04/29/2020 BY BAUGHAM,M. (NOTE) SARS-CoV-2 target nucleic acids are DETECTED.  The SARS-CoV-2 RNA is generally detectable in upper respiratory specimens during the acute phase of infection. Positive results are indicative of the presence of the identified virus, but do not rule out bacterial infection or co-infection with other pathogens not detected by the test. Clinical correlation with patient history and other diagnostic information is necessary to determine patient infection status. The expected result is Negative.  Fact Sheet for Patients: EntrepreneurPulse.com.au  Fact Sheet for Healthcare Providers: IncredibleEmployment.be  This test is not yet approved or cleared by the [place] FDA and  has been authorized for detection and/or  diagnosis of SARS-CoV-2 by FDA under an Emergency Use Authorization (EUA).  This EUA will remain in effect (meaning this t est can be used) for the duration of  the COVID-19 declaration under Section 564(b)(1) of the Act, 21 U.S.C. section 360bbb-3(b)(1), unless the authorization is terminated or revoked sooner.     Influenza A by PCR NEGATIVE NEGATIVE Final   Influenza B by PCR NEGATIVE NEGATIVE Final    Comment: (NOTE) The Xpert Xpress SARS-CoV-2/FLU/RSV plus assay is intended as an aid in the diagnosis of influenza from Nasopharyngeal swab specimens and should not be used as a sole basis for treatment. Nasal washings and aspirates are unacceptable for Xpert Xpress SARS-CoV-2/FLU/RSV testing.  Fact Sheet for Patients: EntrepreneurPulse.com.au  Fact Sheet for Healthcare Providers: IncredibleEmployment.be  This test is not yet approved or cleared by the [place] FDA and has been authorized for detection and/or diagnosis of SARS-CoV-2 by FDA under an Emergency Use Authorization (EUA). This EUA will remain in effect (meaning this test can be used) for the duration of the COVID-19 declaration under Section 564(b)(1) of the Act, 21 U.S.C. section 360bbb-3(b)(1), unless the authorization is terminated or revoked.  Performed at [facility], [address], [place], [place] [address]      Scheduled Meds: . citalopram  20 mg Oral Daily  . dextromethorphan-guaiFENesin  1 tablet Oral BID  . feeding supplement (GLUCERNA SHAKE)  237 mL Oral TID BM  . flecainide  100 mg Oral Q12H  . gabapentin  100 mg Oral TID  . insulin aspart  0-15 Units Subcutaneous TID WC  . insulin aspart  0-5 Units Subcutaneous QHS  . insulin aspart  4 Units Subcutaneous TID WC  . insulin detemir  10 Units Subcutaneous Daily  . levothyroxine  75 mcg Oral Q0600  . methylPREDNISolone (SOLU-MEDROL) injection  40 mg Intravenous Q12H  . pantoprazole  40 mg Oral Daily  .  warfarin  2.5 mg Oral ONCE-1600  . Warfarin - Pharmacist Dosing Inpatient   Does not apply q1600   Continuous Infusions:  Procedures/Studies: DG Chest Port 1 View  Result Date: 04/28/2020 CLINICAL DATA:  COVID-19 positive 03/23/2020, shortness of breath and fever EXAM: PORTABLE CHEST 1 VIEW COMPARISON:  Radiograph 02/15/2018 FINDINGS: Multifocal patchy opacities are present throughout both lungs in a basilar and peripheral predominance which appears superimposed on more chronic interstitial changes and low lung volumes. No pneumothorax or effusion. Accessory azygos fissure in tortuosity of the brachiocephalic vasculature are similar to prior. Pacer pack overlies left chest wall with leads in stable position at the right atrium and cardiac apex. The aorta is calcified. The remaining cardiomediastinal contours are unremarkable. No acute osseous or  soft tissue abnormality. Degenerative changes are present in the imaged spine and shoulders. Prior left shoulder hemiarthroplasty appears high-riding, likely related to the rotator cuff insufficiency. IMPRESSION: Multifocal patchy opacities throughout both lungs in a basilar and peripheral predominance. Findings are compatible with multifocal pneumonia in the setting of COVID-19. Superimposed on more chronic interstitial changes and low lung volumes. Aortic Atherosclerosis (ICD10-I70.0). Electronically Signed   By: Lovena Le M.D.   On: 04/28/2020 19:48   US Abdomen Limited RUQ (LIVER/GB)  Result Date: 04/29/2020 CLINICAL DATA:  Elevated LFTs EXAM: ULTRASOUND ABDOMEN LIMITED RIGHT UPPER QUADRANT COMPARISON:  None. FINDINGS: Gallbladder: Prior cholecystectomy Common bile duct: Diameter: Normal caliber, 5 mm Liver: Mildly increased echotexture. No focal abnormality or biliary ductal dilatation. Portal vein is patent on color Doppler imaging with normal direction of blood flow towards the liver. Other: None. IMPRESSION: Mildly increased hepatic echotexture  compatible with fatty infiltration. No focal abnormality. Electronically Signed   By: Rolm Baptise M.D.   On: 04/29/2020 11:31    Orson Eva, DO  [facility]  If 7PM-7AM, please contact night-coverage [email or web address] Password [email or web address] 04/30/2020, 5:25 PM   LOS: 2 days

## 2020-05-01 DIAGNOSIS — E1122 Type 2 diabetes mellitus with diabetic chronic kidney disease: Secondary | ICD-10-CM

## 2020-05-01 DIAGNOSIS — N1831 Chronic kidney disease, stage 3a: Secondary | ICD-10-CM

## 2020-05-01 LAB — CBC WITH DIFFERENTIAL/PLATELET
Abs Immature Granulocytes: 0.22 10*3/uL — ABNORMAL HIGH (ref 0.00–0.07)
Basophils Absolute: 0 10*3/uL (ref 0.0–0.1)
Basophils Relative: 0 %
Eosinophils Absolute: 0 10*3/uL (ref 0.0–0.5)
Eosinophils Relative: 0 %
HCT: 38 % (ref 36.0–46.0)
Hemoglobin: 12.7 g/dL (ref 12.0–15.0)
Immature Granulocytes: 1 %
Lymphocytes Relative: 4 %
Lymphs Abs: 1 10*3/uL (ref 0.7–4.0)
MCH: 33.2 pg (ref 26.0–34.0)
MCHC: 33.4 g/dL (ref 30.0–36.0)
MCV: 99.5 fL (ref 80.0–100.0)
Monocytes Absolute: 0.4 10*3/uL (ref 0.1–1.0)
Monocytes Relative: 2 %
Neutro Abs: 22.7 10*3/uL — ABNORMAL HIGH (ref 1.7–7.7)
Neutrophils Relative %: 93 %
Platelets: 291 10*3/uL (ref 150–400)
RBC: 3.82 MIL/uL — ABNORMAL LOW (ref 3.87–5.11)
RDW: 14.2 % (ref 11.5–15.5)
WBC: 24.3 10*3/uL — ABNORMAL HIGH (ref 4.0–10.5)
nRBC: 0 % (ref 0.0–0.2)

## 2020-05-01 LAB — COMPREHENSIVE METABOLIC PANEL
ALT: 193 U/L — ABNORMAL HIGH (ref 0–44)
AST: 126 U/L — ABNORMAL HIGH (ref 15–41)
Albumin: 2.9 g/dL — ABNORMAL LOW (ref 3.5–5.0)
Alkaline Phosphatase: 100 U/L (ref 38–126)
Anion gap: 12 (ref 5–15)
BUN: 48 mg/dL — ABNORMAL HIGH (ref 8–23)
CO2: 23 mmol/L (ref 22–32)
Calcium: 9.1 mg/dL (ref 8.9–10.3)
Chloride: 102 mmol/L (ref 98–111)
Creatinine, Ser: 1.11 mg/dL — ABNORMAL HIGH (ref 0.44–1.00)
GFR, Estimated: 51 mL/min — ABNORMAL LOW (ref >60)
Glucose, Bld: 204 mg/dL — ABNORMAL HIGH (ref 70–99)
Potassium: 3.7 mmol/L (ref 3.5–5.1)
Sodium: 137 mmol/L (ref 135–145)
Total Bilirubin: 0.6 mg/dL (ref 0.3–1.2)
Total Protein: 6.9 g/dL (ref 6.5–8.1)

## 2020-05-01 LAB — D-DIMER, QUANTITATIVE: D-Dimer, Quant: 0.69 ug/mL-FEU — ABNORMAL HIGH (ref 0.00–0.50)

## 2020-05-01 LAB — GLUCOSE, CAPILLARY
Glucose-Capillary: 188 mg/dL — ABNORMAL HIGH (ref 70–99)
Glucose-Capillary: 256 mg/dL — ABNORMAL HIGH (ref 70–99)
Glucose-Capillary: 251 mg/dL — ABNORMAL HIGH (ref 70–99)

## 2020-05-01 LAB — PROTIME-INR
INR: 2.8 — ABNORMAL HIGH (ref 0.8–1.2)
Prothrombin Time: 28.6 seconds — ABNORMAL HIGH (ref 11.4–15.2)

## 2020-05-01 LAB — PHOSPHORUS: Phosphorus: 3.3 mg/dL (ref 2.5–4.6)

## 2020-05-01 LAB — C-REACTIVE PROTEIN: CRP: 11.1 mg/dL — ABNORMAL HIGH (ref <1.0)

## 2020-05-01 LAB — MAGNESIUM: Magnesium: 2.1 mg/dL (ref 1.7–2.4)

## 2020-05-01 LAB — FERRITIN: Ferritin: 1313 ng/mL — ABNORMAL HIGH (ref 11–307)

## 2020-05-01 MED ORDER — ATORVASTATIN CALCIUM 40 MG PO TABS
20.0000 mg | ORAL_TABLET | ORAL | Status: DC
Start: 2020-05-01 — End: 2020-07-22

## 2020-05-01 MED ORDER — PREDNISONE 20 MG PO TABS: ORAL_TABLET | ORAL | 0 refills | Status: DC

## 2020-05-01 MED ORDER — PANTOPRAZOLE SODIUM 40 MG PO TBEC
40.0000 mg | DELAYED_RELEASE_TABLET | Freq: Every day | ORAL | 1 refills | Status: DC
Start: 2020-05-02 — End: 2020-07-22

## 2020-05-01 MED ORDER — FUROSEMIDE 80 MG PO TABS
160.0000 mg | ORAL_TABLET | Freq: Two times a day (BID) | ORAL | Status: DC
Start: 2020-05-03 — End: 2021-09-29

## 2020-05-01 MED ORDER — WARFARIN SODIUM 2.5 MG PO TABS
2.5000 mg | ORAL_TABLET | Freq: Once | ORAL | Status: AC
  Administered 2020-05-01: 2.5 mg via ORAL
  Filled 2020-05-01: qty 1

## 2020-05-01 MED ORDER — DM-GUAIFENESIN ER 30-600 MG PO TB12
1.0000 | ORAL_TABLET | Freq: Two times a day (BID) | ORAL | 0 refills | Status: DC
Start: 2020-05-01 — End: 2020-07-22

## 2020-05-01 NOTE — Discharge Summary (Signed)
Physician Discharge Summary  [name]:[id number] DOB: 1941/03/09 DOA: 04/28/2020  PCP: Celene Squibb, MD  Admit date: 04/28/2020 Discharge date: 05/01/2020  Time spent: 35 minutes  Recommendations for Outpatient Follow-up:  1. Repeat complete metabolic panel to follow extra lites, renal function and LFTs. 2. Resume statins if LFTs back to normal   Discharge Diagnoses:  Principal Problem:   Pneumonia due to COVID-19 virus Active Problems:   Symptomatic bradycardia   Pacemaker-St.Jude   HLD (hyperlipidemia)   Type 2 diabetes mellitus (HCC)   Hypokalemia   Hypoalbuminemia   Acute respiratory failure with hypoxia (HCC)   Transaminasemia Chronic diastolic heart failure.  Discharge Condition: Stable and improved.  Discharged home with instruction to follow-up with PCP in 10 days and to follow-up with cardiology service as an outpatient.  Home health services arranged at time of discharge.  CODE STATUS: Full code.  Diet recommendation: Heart healthy diet.  Filed Weights   04/28/20 1820 04/29/20 2040  Weight: 80.7 kg 83.3 kg    History of present illness:  79 year old female with a history of atrial fibrillation, stroke, diabetes mellitus, hypertension, hyperlipidemia, hypothyroidism presenting with 1 week history of generalized weakness, coughing, and progressive shortness of breath. Patient was diagnosed with positive Covid test on 04/23/2020 by her PCP.She stated that her symptoms have began approximately 2 to 3 days prior to the test. Since that period of time, the patient has continued to have low-grade temperatures, nonproductive cough, and worsening shortness of breath. She has been vaccinated for COVID-19. She has some nausea without any emesis. She denied any headache, chest pain, diarrhea, abdominal pain, dysuria, hematuria. In the emergency department, the patient had a low-grade temperature of 99.8 F. She was hemodynamically stable. Oxygen saturation was  in the mid 80s on room air with ambulation. Patient was started on remdesivir and steroids. Chest x-ray showed patchy infiltrates, recurrent on the left. There was increased interstitial markings.  Hospital Course:  Acute respiratory failure with hypoxia secondary to COVID-19 -No requiring oxygen supplementation at discharge. -Inflammatory markers trending down and improving consistently. -remdesivir was discontinued after 2 doses due to rising LFTs -Patient will continue steroids at discharge with tapering information.  Sick sinus syndrome -Status post PPM -Continue to follows with Dr. [name] -continue flecainide  Atrial fibrillation,paroxysmal -Currently in sinus rhythm and rate controlled -Continue warfarin, no prevention -Continue patient follow-up with cardiology service.  Chronic diastolic heart failure -Stable and compensated. -Continue low-sodium diet -Continue to follow adequate hydration and check weight on daily basis -Resume oral diuretics as instructed.  Hyperlipidemia -holdingstatin due to elevated LFTs -reassess at follow up visit and resume statins if appropriate.  Diabetes mellitus type 2 -A1C--6.9 -Modified carbohydrate diet has been recommended -Continue the use of glipizide.  Acute kidney injury on CKD 3a -baseline creatinine 1.0 -Cr peaked at 1.26 on admission. -In the setting of dehydration and continue use of diuretics -Improve after fluid resuscitation -Will continue holding diuretic for 2 more days after discharge -Repeat basic metabolic panel follow-up visit to reassess creatinine trend and stability.  Hypothyroidism -Continue levothyroxine  Hypokalemia -repleted and within normal limits at discharge. -Magnesium within normal limits.  Transaminasemia -due to COVID-19 infection and continue use of statins. -LFTs trending trending down appropriately after remdesivir discontinuation. -hep B and C neg -RUQ  ultrasound--demonstrating steatosis -holding statin also follow-up with PCP. Repeat LFTs at that time.  Deconditioning -Patient seen by physical therapy and recommendations given for home health PT.  Procedures: See below for x-ray  reports.  Consultations:  None   Discharge Exam: Vitals:   05/01/20 1500 05/01/20 1512  BP: (!) 150/59   Pulse: 61 75  Resp: 18 20  Temp: 98.4 F (36.9 C)   SpO2: 98% 96%    General: Afebrile, no chest pain, no nausea, no vomiting.  Provement in her breathing and is not requiring oxygen supplementation currently.  Patient is ready to go home with home health services. Cardiovascular: Rate controlled, no rubs, no gallops, no JVD on exam. Respiratory: Improved air movement bilaterally, no using accessory muscles, able to speak in full sentences.  Good oxygen saturation on room air. Abdomen: Soft, nontender, distended, positive bowel sounds Extremities: No edema, cyanosis or clubbing.  Discharge Instructions   Discharge Instructions    (HEART FAILURE PATIENTS) Call MD:  Anytime you have any of the following symptoms: 1) 3 pound weight gain in 24 hours or 5 pounds in 1 week 2) shortness of breath, with or without a dry hacking cough 3) swelling in the hands, feet or stomach 4) if you have to sleep on extra pillows at night in order to breathe.   Complete by: As directed    Diet - low sodium heart healthy   Complete by: As directed    Discharge instructions   Complete by: As directed    Take medications as prescribed Maintain adequate hydration and nutrition Arrange follow-up with PCP in 10 days Continue to follow the 3W's (Wait 6 feet apart/social distancing; Wear mask and Wash hands repeatedly). Continue to maintain isolation for 10 days.   Increase activity slowly   Complete by: As directed      Allergies as of 05/01/2020      Reactions   Other Hives   Florinal for headaches.   Penicillins Rash   Tetanus Toxoids Rash      Medication  List    STOP taking these medications   azithromycin 250 MG tablet Commonly known as: ZITHROMAX   potassium chloride SA 20 MEQ tablet Commonly known as: KLOR-CON     TAKE these medications   albuterol 108 (90 Base) MCG/ACT inhaler Commonly known as: VENTOLIN HFA Inhale into the lungs.   albuterol (2.5 MG/3ML) 0.083% nebulizer solution Commonly known as: PROVENTIL Take 2.5 mg by nebulization See admin instructions. Every 4-6 hours as needed   allopurinol 100 MG tablet Commonly known as: ZYLOPRIM Take 200 mg by mouth daily.   atorvastatin 40 MG tablet Commonly known as: LIPITOR Take 0.5 tablets (20 mg total) by mouth every other day. Hold on to follow-up with PCP. What changed: additional instructions   calcitRIOL 0.25 MCG capsule Commonly known as: ROCALTROL Take 2 tablets by mouth every other day and take 1 tablet daily on the other days   citalopram 20 MG tablet Commonly known as: CELEXA Take 20 mg by mouth daily.   dextromethorphan-guaiFENesin 30-600 MG 12hr tablet Commonly known as: MUCINEX DM Take 1 tablet by mouth 2 (two) times daily.   DHA-EPA-Flaxseed Oil-Vitamin E Caps Take 1 capsule by mouth 2 (two) times daily.   flecainide 100 MG tablet Commonly known as: TAMBOCOR Take 1 tablet (100 mg total) by mouth 2 (two) times daily.   fluticasone 50 MCG/ACT nasal spray Commonly known as: FLONASE Place 2 sprays into the nose daily as needed. For congestion   furosemide 80 MG tablet Commonly known as: LASIX Take 2 tablets (160 mg total) by mouth 2 (two) times daily. Start taking on: May 03, 2020 What changed: These instructions start  on May 03, 2020. If you are unsure what to do until then, ask your doctor or other care provider.   gabapentin 100 MG capsule Commonly known as: NEURONTIN Take 1 capsule (100 mg total) by mouth 3 (three) times daily.   glipiZIDE 10 MG 24 hr tablet Commonly known as: GLUCOTROL XL Take 10 mg by mouth daily.    HYDROcodone-acetaminophen 7.5-325 MG tablet Commonly known as: NORCO Take 1 tablet by mouth 3 (three) times daily as needed.   levothyroxine 75 MCG tablet Commonly known as: SYNTHROID Take 75 mcg by mouth daily.   pantoprazole 40 MG tablet Commonly known as: PROTONIX Take 1 tablet (40 mg total) by mouth daily. Start taking on: May 02, 2020   predniSONE 20 MG tablet Commonly known as: Deltasone Take 3 tablets by mouth daily x2 days; then 2 tablets by mouth daily x3 days; then 1 tablet by mouth daily x3 days; then half tablet by mouth daily x3 days and stop prednisone.   vitamin B-12 1000 MCG tablet Commonly known as: CYANOCOBALAMIN Take 5,000 mcg by mouth daily.   Vitamin D 50 MCG (2000 UT) Caps Take 2,000 Units by mouth daily.   warfarin 5 MG tablet Commonly known as: COUMADIN 5mg  Mon, Wed, Fri, Sat; 2.5mg  other days      Allergies  Allergen Reactions  . Other Hives    Florinal for headaches.  . Penicillins Rash  . Tetanus Toxoids Rash    Follow-up Information    Celene Squibb, MD. Schedule an appointment as soon as possible for a visit in 10 day(s).   Specialty: Internal Medicine Contact information: [place] [facility] [address] [phone]        Thompson Grayer, MD .   Specialty: Cardiology Contact information: [place] [address] [phone]               The results of significant diagnostics from this hospitalization (including imaging, microbiology, ancillary and laboratory) are listed below for reference.    Significant Diagnostic Studies: DG Chest Port 1 View  Result Date: 04/28/2020 CLINICAL DATA:  COVID-19 positive 03/23/2020, shortness of breath and fever EXAM: PORTABLE CHEST 1 VIEW COMPARISON:  Radiograph 02/15/2018 FINDINGS: Multifocal patchy opacities are present throughout both lungs in a basilar and peripheral predominance which appears superimposed on more chronic interstitial changes  and low lung volumes. No pneumothorax or effusion. Accessory azygos fissure in tortuosity of the brachiocephalic vasculature are similar to prior. Pacer pack overlies left chest wall with leads in stable position at the right atrium and cardiac apex. The aorta is calcified. The remaining cardiomediastinal contours are unremarkable. No acute osseous or soft tissue abnormality. Degenerative changes are present in the imaged spine and shoulders. Prior left shoulder hemiarthroplasty appears high-riding, likely related to the rotator cuff insufficiency. IMPRESSION: Multifocal patchy opacities throughout both lungs in a basilar and peripheral predominance. Findings are compatible with multifocal pneumonia in the setting of COVID-19. Superimposed on more chronic interstitial changes and low lung volumes. Aortic Atherosclerosis (ICD10-I70.0). Electronically Signed   By: Lovena Le M.D.   On: 04/28/2020 19:48   US Abdomen Limited RUQ (LIVER/GB)  Result Date: 04/29/2020 CLINICAL DATA:  Elevated LFTs EXAM: ULTRASOUND ABDOMEN LIMITED RIGHT UPPER QUADRANT COMPARISON:  None. FINDINGS: Gallbladder: Prior cholecystectomy Common bile duct: Diameter: Normal caliber, 5 mm Liver: Mildly increased echotexture. No focal abnormality or biliary ductal dilatation. Portal vein is patent on color Doppler imaging with normal direction of blood flow  towards the liver. Other: None. IMPRESSION: Mildly increased hepatic echotexture compatible with fatty infiltration. No focal abnormality. Electronically Signed   By: Rolm Baptise M.D.   On: 04/29/2020 11:31    Microbiology: Recent Results (from the past 240 hour(s))  Blood Culture (routine x 2)     Status: None (Preliminary result)   Collection Time: 04/28/20  7:29 PM   Specimen: BLOOD LEFT ARM  Result Value Ref Range Status   Specimen Description BLOOD LEFT ARM  Final   Special Requests   Final    BOTTLES DRAWN AEROBIC AND ANAEROBIC Blood Culture adequate volume   Culture   Final     NO GROWTH < 24 HOURS Performed at [facility], [address] [place], [place] [address]    Report Status PENDING  Incomplete  Blood Culture (routine x 2)     Status: None (Preliminary result)   Collection Time: 04/28/20 11:33 PM   Specimen: BLOOD LEFT HAND  Result Value Ref Range Status   Specimen Description BLOOD LEFT HAND  Final   Special Requests   Final    BOTTLES DRAWN AEROBIC AND ANAEROBIC Blood Culture adequate volume   Culture   Final    NO GROWTH < 12 HOURS Performed at [facility], [address], [place], [place] [address]    Report Status PENDING  Incomplete  Resp Panel by RT-PCR (Flu A&B, Covid) Nasopharyngeal Swab     Status: Abnormal   Collection Time: 04/29/20 12:30 PM   Specimen: Nasopharyngeal Swab; Nasopharyngeal(NP) swabs in vial transport medium  Result Value Ref Range Status   SARS Coronavirus 2 by RT PCR POSITIVE (A) NEGATIVE Final    Comment: RESULT CALLED TO, READ BACK BY AND VERIFIED WITH: DOSS,M. AT 1339 ON 04/29/2020 BY BAUGHAM,M. (NOTE) SARS-CoV-2 target nucleic acids are DETECTED.  The SARS-CoV-2 RNA is generally detectable in upper respiratory specimens during the acute phase of infection. Positive results are indicative of the presence of the identified virus, but do not rule out bacterial infection or co-infection with other pathogens not detected by the test. Clinical correlation with patient history and other diagnostic information is necessary to determine patient infection status. The expected result is Negative.  Fact Sheet for Patients: EntrepreneurPulse.com.au  Fact Sheet for Healthcare Providers: IncredibleEmployment.be  This test is not yet approved or cleared by the [place] FDA and  has been authorized for detection and/or diagnosis of SARS-CoV-2 by FDA under an Emergency Use Authorization (EUA).  This EUA will remain in effect (meaning this t est can be used) for the duration of   the COVID-19 declaration under Section 564(b)(1) of the Act, 21 U.S.C. section 360bbb-3(b)(1), unless the authorization is terminated or revoked sooner.     Influenza A by PCR NEGATIVE NEGATIVE Final   Influenza B by PCR NEGATIVE NEGATIVE Final    Comment: (NOTE) The Xpert Xpress SARS-CoV-2/FLU/RSV plus assay is intended as an aid in the diagnosis of influenza from Nasopharyngeal swab specimens and should not be used as a sole basis for treatment. Nasal washings and aspirates are unacceptable for Xpert Xpress SARS-CoV-2/FLU/RSV testing.  Fact Sheet for Patients: EntrepreneurPulse.com.au  Fact Sheet for Healthcare Providers: IncredibleEmployment.be  This test is not yet approved or cleared by the [place] FDA and has been authorized for detection and/or diagnosis of SARS-CoV-2 by FDA under an Emergency Use Authorization (EUA). This EUA will remain in effect (meaning this test can be used) for the duration of the COVID-19 declaration under Section 564(b)(1) of the  Act, 21 U.S.C. section 360bbb-3(b)(1), unless the authorization is terminated or revoked.  Performed at [facility], [address] [place], [place] [address]      Labs: Basic Metabolic Panel: Recent Labs  Lab 04/28/20 1929 04/29/20 0539 04/30/20 0419 05/01/20 0406  NA 137 136 135 137  K 2.7* 3.1* 3.3* 3.7  CL 96* 100 100 102  CO2 28 23 23 23   GLUCOSE 124* 181* 344* 204*  BUN 20 24* 37* 48*  CREATININE 1.26* 1.17* 1.15* 1.11*  CALCIUM 8.5* 8.3* 8.9 9.1  MG  --  1.8 1.9 2.1  PHOS  --  3.8 2.8 3.3   Liver Function Tests: Recent Labs  Lab 04/28/20 1929 04/29/20 0539 04/30/20 0419 05/01/20 0406  AST 155* 183* 298* 126*  ALT 120* 142* 251* 193*  ALKPHOS 91 88 95 100  BILITOT 1.0 1.1 0.7 0.6  PROT 7.2 6.8 6.7 6.9  ALBUMIN 3.1* 2.9* 2.7* 2.9*   CBC: Recent Labs  Lab 04/28/20 1929 04/29/20 0539 04/30/20 0419 05/01/20 0406  WBC 10.4 9.6 17.4* 24.3*   NEUTROABS 8.0*  --  16.2* 22.7*  HGB 12.8 12.4 11.7* 12.7  HCT 38.9 37.9 35.8* 38.0  MCV 99.2 101.3* 99.7 99.5  PLT 191 191 220 291   CBG: Recent Labs  Lab 04/30/20 1647 04/30/20 2126 05/01/20 0759 05/01/20 1120 05/01/20 1609  GLUCAP 142* 250* 188* 256* 251*    Signed:  Barton Dubois MD.  [facility] 05/01/2020, 4:22 PM

## 2020-05-01 NOTE — Care Management Important Message (Signed)
Important Message  Patient Details  Name: [name] MRN: [id number] Date of Birth: May 23, 1941   Medicare Important Message Given:  Yes     Tommy Medal 05/01/2020, 3:57 PM

## 2020-05-01 NOTE — Clinical Social Work Note (Signed)
Spoke with daughter, Baldo Ash, discussed discharge planning as HHPT/RN orders are in. Discussed possible palliative referral. She wants to speak with attending prior to making choice on [facility] services and palliative. TOC Requested [place] contact TOC once she spoke with her attending and made decisions regarding discharge services desired. TOC provided contact phone number.

## 2020-05-01 NOTE — Progress Notes (Signed)
Inpatient Diabetes Program Recommendations  AACE/ADA: New Consensus Statement on Inpatient Glycemic Control   Target Ranges:  Prepandial:   less than 140 mg/dL      Peak postprandial:   less than 180 mg/dL (1-2 hours)      Critically ill patients:  140 - 180 mg/dL   Results for MONCHEL, POLLITT (MRN [id number]) as of 05/01/2020 08:12  Ref. Range 04/30/2020 07:58 04/30/2020 11:19 04/30/2020 16:47 04/30/2020 21:26  Glucose-Capillary Latest Ref Range: 70 - 99 mg/dL 330 (H) 353 (H) 142 (H) 250 (H)   Review of Glycemic Control  Diabetes history: DM2 Outpatient Diabetes medications: Glipizide XL 10 mg daily Current orders for Inpatient glycemic control: Levemir 10 units daily, Novolog 4 units TID with meals for meal coverage, Novolog 0-15 units TID with meals, Novolog 0-5 units QHS; Solumedrol 40 mg Q12H  Inpatient Diabetes Program Recommendations:    Insulin: If steroids are continued as ordered, please consider increasing Levemir to 13 units daily and meal coverage to Novolog 6 units TID with meals.  Thanks, Barnie Alderman, RN, MSN, CDE Diabetes Coordinator Inpatient Diabetes Program [phone] (Team Pager from 8am to 5pm)

## 2020-05-01 NOTE — Progress Notes (Signed)
ANTICOAGULATION CONSULT NOTE -   Pharmacy Consult for warfarin Indication: atrial fibrillation  Allergies  Allergen Reactions  . Other Hives    Florinal for headaches.  . Penicillins Rash  . Tetanus Toxoids Rash    Patient Measurements: Height: 5\' 6"  (167.6 cm) Weight: 83.3 kg (183 lb 10.3 oz) IBW/kg (Calculated) : 59.3  Vital Signs: Temp: 97.5 F (36.4 C) (12/08 0540) Temp Source: Oral (12/08 0540) BP: 132/79 (12/08 0540) Pulse Rate: 71 (12/08 0540)  Labs: Recent Labs    04/29/20 0539 04/29/20 0539 04/30/20 0419 05/01/20 0406  HGB 12.4   < > 11.7* 12.7  HCT 37.9  --  35.8* 38.0  PLT 191  --  220 291  APTT 55*  --   --   --   LABPROT 25.0*  --  27.1* 28.6*  INR 2.4*  --  2.6* 2.8*  CREATININE 1.17*  --  1.15* 1.11*   < > = values in this interval not displayed.    Estimated Creatinine Clearance: 46.2 mL/min (A) (by C-G formula based on SCr of 1.11 mg/dL (H)).   Medical History: Past Medical History:  Diagnosis Date  . A-fib ([place])   . Chronic kidney disease   . Colon polyps   . CVA (cerebral vascular accident) ([place])   . Depression   . DM (diabetes mellitus) ([place])   . GERD (gastroesophageal reflux disease)   . HTN (hypertension)   . Hypercholesterolemia   . Hypothyroidism   . Pancreatitis    biliary  . S/P colonoscopy March 2006   Dr. Olevia Perches: per e-chart report: diverticulosis, path adenomatous polyps  . S/P endoscopy March 2009   multiple 3-4 mmsessile gastric polyps, benign, singl antral erosions, negative H.pylori, reactive gastropathy ? NSAIDs  . Symptomatic bradycardia    s/p St. Jude dual chamber PPM 02/05/12    Medications:  Facility-Administered Medications Prior to Admission  Medication Dose Route Frequency Provider Last Rate Last Admin  . triamcinolone acetonide (KENALOG) 10 MG/ML injection 10 mg  10 mg Other Once Landis Martins, DPM       Medications Prior to Admission  Medication Sig Dispense Refill Last Dose  . albuterol  (PROVENTIL) (2.5 MG/3ML) 0.083% nebulizer solution Take 2.5 mg by nebulization See admin instructions. Every 4-6 hours as needed   Past Week at Unknown time  . albuterol (VENTOLIN HFA) 108 (90 Base) MCG/ACT inhaler Inhale into the lungs.   04/28/2020 at Unknown time  . allopurinol (ZYLOPRIM) 100 MG tablet Take 200 mg by mouth daily.   04/26/2020 at 0900  . atorvastatin (LIPITOR) 40 MG tablet Take 20 mg by mouth every other day.    Past Week at Unknown time  . calcitRIOL (ROCALTROL) 0.25 MCG capsule Take 2 tablets by mouth every other day and take 1 tablet daily on the other days   Past Week at Unknown time  . Cholecalciferol (VITAMIN D) 2000 units CAPS Take 2,000 Units by mouth daily.   Past Week at Unknown time  . citalopram (CELEXA) 20 MG tablet Take 20 mg by mouth daily.     Past Week at Unknown time  . DHA-EPA-Flaxseed Oil-Vitamin E CAPS Take 1 capsule by mouth 2 (two) times daily.   Past Week at Unknown time  . flecainide (TAMBOCOR) 100 MG tablet Take 1 tablet (100 mg total) by mouth 2 (two) times daily. 180 tablet 3 Past Week at Unknown time  . furosemide (LASIX) 80 MG tablet Take 2 tablets (160 mg total) by mouth 2 (two)  times daily. 180 tablet 3 Past Week at Unknown time  . gabapentin (NEURONTIN) 100 MG capsule Take 1 capsule (100 mg total) by mouth 3 (three) times daily. 90 capsule 5 Past Week at Unknown time  . glipiZIDE (GLUCOTROL XL) 10 MG 24 hr tablet Take 10 mg by mouth daily.     Past Week at Unknown time  . HYDROcodone-acetaminophen (NORCO) 7.5-325 MG tablet Take 1 tablet by mouth 3 (three) times daily as needed.   Past Week at Unknown time  . levothyroxine (SYNTHROID, LEVOTHROID) 75 MCG tablet Take 75 mcg by mouth daily.     Past Week at Unknown time  . vitamin B-12 (CYANOCOBALAMIN) 1000 MCG tablet Take 5,000 mcg by mouth daily.   Past Week at Unknown time  . warfarin (COUMADIN) 5 MG tablet 5mg  Mon, Wed, Fri, Sat; 2.5mg  other days   Past Week at 1730  . azithromycin (ZITHROMAX) 250 MG  tablet Take 250 mg by mouth as directed. (Patient not taking: Reported on 04/29/2020)   Completed Course at Unknown time  . fluticasone (FLONASE) 50 MCG/ACT nasal spray Place 2 sprays into the nose daily as needed. For congestion (Patient not taking: Reported on 04/29/2020)   Not Taking at Unknown time  . potassium chloride SA (K-DUR,KLOR-CON) 20 MEQ tablet Take 1 tablet by mouth as needed.  (Patient not taking: Reported on 04/29/2020)   Not Taking at Unknown time    Assessment: Pharmacy consulted to dose warfarin in patient with atrial fibrillation.  Patient's INR on admission is 2.4 with last dose given 12/4.  Patient's home dose listed as 5 mg MWF and 2.5 mg ROW. INR 2.8, therapeutic  Goal of Therapy:  INR 2-3 Monitor platelets by anticoagulation protocol: Yes   Plan:  Warfarin 2.5 mg x 1 dose today Monitor daily INR and s/s of bleeding.  Margot Ables, PharmD Clinical Pharmacist 05/01/2020 8:21 AM

## 2020-05-01 NOTE — Progress Notes (Signed)
Pt discharged via WC to POV with all personal belongins in her possession

## 2020-05-01 NOTE — Progress Notes (Signed)
Pt remain in recliner. SaO2 on RA 100%. Pt ambulated 40 ft in her room with lowest recorded SaO2 at 95%. Pt encouraged to use purse-lipped breathing to control her resp rate and to utilize frequent rest stops to minimize exertion if she feels any SOB. Pt stated understanding. Pt tolerated well.

## 2020-05-01 NOTE — Progress Notes (Signed)
Pt wearing O2 [place] at 1.5 lpm when I entered room for am med pass. Pt stated that she got up to the bathroom and began coughing and felt SOB, so she put her O2 back on. Currently pt sitting in chair, denies SOB. SaO2 100% on 1.5 lpm [place] at rest. MD [place] in to evaluate pt. O2 removed and SaO2 rechecked after 10 minutes with reading of 95% RA.  Lungs clear to auscultation, occas nonproductive cough. Denies c/o at present.

## 2020-05-03 LAB — CULTURE, BLOOD (ROUTINE X 2)
Culture: NO GROWTH
Special Requests: ADEQUATE

## 2020-05-04 LAB — CULTURE, BLOOD (ROUTINE X 2)
Culture: NO GROWTH
Special Requests: ADEQUATE

## 2020-05-06 ENCOUNTER — Other Ambulatory Visit: Payer: Self-pay | Admitting: *Deleted

## 2020-05-06 NOTE — Patient Outreach (Signed)
[facility]) Care Management  05/06/2020  SERENITEE FUERTES 1941-03-22 [id number]    EMMI-GENERAL DISCHARGE-SUCCESSFUL-RESOLVED RED ON EMMI ALERT Day #1 Date: 05/04/2020 Red Alert Reason: NO FOLLOW UP APPOINTMENT  OUTREACH #1 RN spoke with pt today and inquired on the above emmi. Pt verified she has a pending appointments (visual) tomorrow with her primary provider. Pt states she will retest for covid on 12/26. Pt aware on her discharge order to contact her CAD provider if needed for follow up.   No additional issues to address at this time. Case will be closed.  Raina Mina, RN Care Management Coordinator [place] Office [phone]

## 2020-05-07 DIAGNOSIS — J1282 Pneumonia due to coronavirus disease 2019: Secondary | ICD-10-CM | POA: Diagnosis not present

## 2020-05-07 DIAGNOSIS — Z09 Encounter for follow-up examination after completed treatment for conditions other than malignant neoplasm: Secondary | ICD-10-CM | POA: Diagnosis not present

## 2020-05-07 DIAGNOSIS — U071 COVID-19: Secondary | ICD-10-CM | POA: Diagnosis not present

## 2020-05-08 DIAGNOSIS — Z5181 Encounter for therapeutic drug level monitoring: Secondary | ICD-10-CM | POA: Diagnosis not present

## 2020-05-14 DIAGNOSIS — I4821 Permanent atrial fibrillation: Secondary | ICD-10-CM | POA: Diagnosis not present

## 2020-05-30 DIAGNOSIS — E1122 Type 2 diabetes mellitus with diabetic chronic kidney disease: Secondary | ICD-10-CM | POA: Diagnosis not present

## 2020-06-12 DIAGNOSIS — I4821 Permanent atrial fibrillation: Secondary | ICD-10-CM | POA: Diagnosis not present

## 2020-06-18 LAB — CUP PACEART REMOTE DEVICE CHECK
Battery Remaining Longevity: 39 mo
Battery Remaining Percentage: 35 %
Battery Voltage: 2.83 V
Brady Statistic AP VP Percent: 18 %
Brady Statistic AP VS Percent: 68 %
Brady Statistic AS VP Percent: 1 %
Brady Statistic AS VS Percent: 14 %
Brady Statistic RA Percent Paced: 86 %
Brady Statistic RV Percent Paced: 18 %
Date Time Interrogation Session: 20220125034500
Implantable Lead Implant Date: 20130913
Implantable Lead Implant Date: 20130913
Implantable Lead Location: 753859
Implantable Lead Location: 753860
Implantable Lead Model: 1948
Implantable Pulse Generator Implant Date: 20130913
Lead Channel Impedance Value: 1175 Ohm
Lead Channel Impedance Value: 490 Ohm
Lead Channel Pacing Threshold Amplitude: 0.75 V
Lead Channel Pacing Threshold Amplitude: 1.5 V
Lead Channel Pacing Threshold Pulse Width: 0.5 ms
Lead Channel Pacing Threshold Pulse Width: 0.5 ms
Lead Channel Sensing Intrinsic Amplitude: 12 mV
Lead Channel Sensing Intrinsic Amplitude: 2.8 mV
Lead Channel Setting Pacing Amplitude: 2 V
Lead Channel Setting Pacing Amplitude: 3 V
Lead Channel Setting Pacing Pulse Width: 0.5 ms
Lead Channel Setting Sensing Sensitivity: 2 mV
Pulse Gen Model: 2210
Pulse Gen Serial Number: 7382865

## 2020-06-22 DIAGNOSIS — E039 Hypothyroidism, unspecified: Secondary | ICD-10-CM | POA: Diagnosis not present

## 2020-06-22 DIAGNOSIS — E1122 Type 2 diabetes mellitus with diabetic chronic kidney disease: Secondary | ICD-10-CM | POA: Diagnosis not present

## 2020-06-22 DIAGNOSIS — M542 Cervicalgia: Secondary | ICD-10-CM | POA: Diagnosis not present

## 2020-06-22 DIAGNOSIS — M791 Myalgia, unspecified site: Secondary | ICD-10-CM | POA: Diagnosis not present

## 2020-06-28 DIAGNOSIS — E669 Obesity, unspecified: Secondary | ICD-10-CM | POA: Diagnosis not present

## 2020-06-28 DIAGNOSIS — E1122 Type 2 diabetes mellitus with diabetic chronic kidney disease: Secondary | ICD-10-CM | POA: Diagnosis not present

## 2020-06-28 DIAGNOSIS — N1831 Chronic kidney disease, stage 3a: Secondary | ICD-10-CM | POA: Diagnosis not present

## 2020-06-28 DIAGNOSIS — E039 Hypothyroidism, unspecified: Secondary | ICD-10-CM | POA: Diagnosis not present

## 2020-06-28 DIAGNOSIS — I482 Chronic atrial fibrillation, unspecified: Secondary | ICD-10-CM | POA: Diagnosis not present

## 2020-06-28 DIAGNOSIS — I129 Hypertensive chronic kidney disease with stage 1 through stage 4 chronic kidney disease, or unspecified chronic kidney disease: Secondary | ICD-10-CM | POA: Diagnosis not present

## 2020-06-28 DIAGNOSIS — E782 Mixed hyperlipidemia: Secondary | ICD-10-CM | POA: Diagnosis not present

## 2020-06-28 DIAGNOSIS — E559 Vitamin D deficiency, unspecified: Secondary | ICD-10-CM | POA: Diagnosis not present

## 2020-06-28 DIAGNOSIS — Z950 Presence of cardiac pacemaker: Secondary | ICD-10-CM | POA: Diagnosis not present

## 2020-06-28 DIAGNOSIS — Z6830 Body mass index (BMI) 30.0-30.9, adult: Secondary | ICD-10-CM | POA: Diagnosis not present

## 2020-07-02 DIAGNOSIS — C44622 Squamous cell carcinoma of skin of right upper limb, including shoulder: Secondary | ICD-10-CM | POA: Diagnosis not present

## 2020-07-09 DIAGNOSIS — I4821 Permanent atrial fibrillation: Secondary | ICD-10-CM | POA: Diagnosis not present

## 2020-07-18 NOTE — Patient Instructions (Signed)
[name]  07/18/2020     @PREFPERIOPPHARMACY @   Your procedure is scheduled on  07/22/2020.   Report to [facility] at  0900  A.M.   Call this number if you have problems the morning of surgery:  [phone]   Remember:  Follow the diet and prep instructions given to you by the office.                   Take these medicines the morning of surgery with A SIP OF WATER  Allopurinol, celexa, tambocor, gabapentin, hydrocodone (if needed), levothyroxine.   Use your nebulizer and your inhalers before you come. Bring your rescue inhaler with you.    Please brush your teeth.  Do not wear jewelry, make-up or nail polish.  Do not wear lotions, powders, or perfumes, or deodorant.  Do not shave 48 hours prior to surgery.  Men may shave face and neck.  Do not bring valuables to the hospital.  [facility] is not responsible for any belongings or valuables.   Contacts, dentures or bridgework may not be worn into surgery.  Leave your suitcase in the car.  After surgery it may be brought to your room.  For patients admitted to the hospital, discharge time will be determined by your treatment team.  Patients discharged the day of surgery will not be allowed to drive home and must have someone with them for 24 hours.   Special instructions:   DO NOT smoke tobacco or vape the morning of your procedure.  Please read over the following fact sheets that you were given. Anesthesia Post-op Instructions and Care and Recovery After Surgery       Colonoscopy, Adult, Care After This sheet gives you information about how to care for yourself after your procedure. Your health care provider may also give you more specific instructions. If you have problems or questions, contact your health care provider. What can I expect after the procedure? After the procedure, it is common to have:  A small amount of blood in your stool for 24 hours after the procedure.  Some gas.  Mild  cramping or bloating of your abdomen. Follow these instructions at home: Eating and drinking  Drink enough fluid to keep your urine pale yellow.  Follow instructions from your health care provider about eating or drinking restrictions.  Resume your normal diet as instructed by your health care provider. Avoid heavy or fried foods that are hard to digest.   Activity  Rest as told by your health care provider.  Avoid sitting for a long time without moving. Get up to take short walks every 1-2 hours. This is important to improve blood flow and breathing. Ask for help if you feel weak or unsteady.  Return to your normal activities as told by your health care provider. Ask your health care provider what activities are safe for you. Managing cramping and bloating  Try walking around when you have cramps or feel bloated.  Apply heat to your abdomen as told by your health care provider. Use the heat source that your health care provider recommends, such as a moist heat pack or a heating pad. ? Place a towel between your skin and the heat source. ? Leave the heat on for 20-30 minutes. ? Remove the heat if your skin turns bright red. This is especially important if you are unable to feel pain, heat, or cold. You may have a greater risk  of getting burned.   General instructions  If you were given a sedative during the procedure, it can affect you for several hours. Do not drive or operate machinery until your health care provider says that it is safe.  For the first 24 hours after the procedure: ? Do not sign important documents. ? Do not drink alcohol. ? Do your regular daily activities at a slower pace than normal. ? Eat soft foods that are easy to digest.  Take over-the-counter and prescription medicines only as told by your health care provider.  Keep all follow-up visits as told by your health care provider. This is important. Contact a health care provider if:  You have blood in  your stool 2-3 days after the procedure. Get help right away if you have:  More than a small spotting of blood in your stool.  Large blood clots in your stool.  Swelling of your abdomen.  Nausea or vomiting.  A fever.  Increasing pain in your abdomen that is not relieved with medicine. Summary  After the procedure, it is common to have a small amount of blood in your stool. You may also have mild cramping and bloating of your abdomen.  If you were given a sedative during the procedure, it can affect you for several hours. Do not drive or operate machinery until your health care provider says that it is safe.  Get help right away if you have a lot of blood in your stool, nausea or vomiting, a fever, or increased pain in your abdomen. This information is not intended to replace advice given to you by your health care provider. Make sure you discuss any questions you have with your health care provider. Document Revised: 05/05/2019 Document Reviewed: 12/05/2018 Elsevier Patient Education  2021 [place] After This sheet gives you information about how to care for yourself after your procedure. Your health care provider may also give you more specific instructions. If you have problems or questions, contact your health care provider. What can I expect after the procedure? After the procedure, it is common to have:  Tiredness.  Forgetfulness about what happened after the procedure.  Impaired judgment for important decisions.  Nausea or vomiting.  Some difficulty with balance. Follow these instructions at home: For the time period you were told by your health care provider:  Rest as needed.  Do not participate in activities where you could fall or become injured.  Do not drive or use machinery.  Do not drink alcohol.  Do not take sleeping pills or medicines that cause drowsiness.  Do not make important decisions or sign legal  documents.  Do not take care of children on your own.      Eating and drinking  Follow the diet that is recommended by your health care provider.  Drink enough fluid to keep your urine pale yellow.  If you vomit: ? Drink water, juice, or soup when you can drink without vomiting. ? Make sure you have little or no nausea before eating solid foods. General instructions  Have a responsible adult stay with you for the time you are told. It is important to have someone help care for you until you are awake and alert.  Take over-the-counter and prescription medicines only as told by your health care provider.  If you have sleep apnea, surgery and certain medicines can increase your risk for breathing problems. Follow instructions from your health care provider about wearing your sleep  device: ? Anytime you are sleeping, including during daytime naps. ? While taking prescription pain medicines, sleeping medicines, or medicines that make you drowsy.  Avoid smoking.  Keep all follow-up visits as told by your health care provider. This is important. Contact a health care provider if:  You keep feeling nauseous or you keep vomiting.  You feel light-headed.  You are still sleepy or having trouble with balance after 24 hours.  You develop a rash.  You have a fever.  You have redness or swelling around the IV site. Get help right away if:  You have trouble breathing.  You have new-onset confusion at home. Summary  For several hours after your procedure, you may feel tired. You may also be forgetful and have poor judgment.  Have a responsible adult stay with you for the time you are told. It is important to have someone help care for you until you are awake and alert.  Rest as told. Do not drive or operate machinery. Do not drink alcohol or take sleeping pills.  Get help right away if you have trouble breathing, or if you suddenly become confused. This information is not  intended to replace advice given to you by your health care provider. Make sure you discuss any questions you have with your health care provider. Document Revised: 01/25/2020 Document Reviewed: 04/13/2019 Elsevier Patient Education  2021 [employer].

## 2020-07-19 ENCOUNTER — Encounter (HOSPITAL_COMMUNITY)
Admission: RE | Admit: 2020-07-19 | Discharge: 2020-07-19 | Disposition: A | Discharge status: Home / Self Care | Payer: Medicare HMO | Source: Ambulatory Visit | Attending: Internal Medicine | Admitting: Internal Medicine

## 2020-07-19 ENCOUNTER — Other Ambulatory Visit: Payer: Self-pay

## 2020-07-19 ENCOUNTER — Other Ambulatory Visit (HOSPITAL_COMMUNITY)
Admission: RE | Admit: 2020-07-19 | Discharge: 2020-07-19 | Disposition: A | Discharge status: Home / Self Care | Payer: Medicare HMO | Source: Ambulatory Visit | Attending: Internal Medicine | Admitting: Internal Medicine

## 2020-07-19 ENCOUNTER — Encounter (HOSPITAL_COMMUNITY): Payer: Self-pay

## 2020-07-19 DIAGNOSIS — Z20822 Contact with and (suspected) exposure to covid-19: Secondary | ICD-10-CM | POA: Insufficient documentation

## 2020-07-19 DIAGNOSIS — Z01812 Encounter for preprocedural laboratory examination: Secondary | ICD-10-CM | POA: Insufficient documentation

## 2020-07-19 HISTORY — DX: Unspecified osteoarthritis, unspecified site: M19.90

## 2020-07-20 LAB — SARS CORONAVIRUS 2 (TAT 6-24 HRS): SARS Coronavirus 2: NEGATIVE

## 2020-07-22 ENCOUNTER — Ambulatory Visit (HOSPITAL_COMMUNITY)
Admission: RE | Admit: 2020-07-22 | Discharge: 2020-07-22 | Disposition: A | Discharge status: Home / Self Care | Payer: Medicare HMO | Attending: Internal Medicine | Admitting: Internal Medicine

## 2020-07-22 ENCOUNTER — Other Ambulatory Visit: Payer: Self-pay

## 2020-07-22 ENCOUNTER — Ambulatory Visit (HOSPITAL_COMMUNITY): Payer: Medicare HMO | Admitting: Anesthesiology

## 2020-07-22 ENCOUNTER — Encounter (HOSPITAL_COMMUNITY): Payer: Self-pay | Admitting: Internal Medicine

## 2020-07-22 ENCOUNTER — Encounter (HOSPITAL_COMMUNITY)
Admission: RE | Disposition: A | Discharge status: Home / Self Care | Payer: Self-pay | Source: Home / Self Care | Attending: Internal Medicine

## 2020-07-22 DIAGNOSIS — Z86010 Personal history of colonic polyps: Secondary | ICD-10-CM | POA: Insufficient documentation

## 2020-07-22 DIAGNOSIS — I69354 Hemiplegia and hemiparesis following cerebral infarction affecting left non-dominant side: Secondary | ICD-10-CM | POA: Insufficient documentation

## 2020-07-22 DIAGNOSIS — Z79890 Hormone replacement therapy (postmenopausal): Secondary | ICD-10-CM | POA: Insufficient documentation

## 2020-07-22 DIAGNOSIS — Q438 Other specified congenital malformations of intestine: Secondary | ICD-10-CM | POA: Insufficient documentation

## 2020-07-22 DIAGNOSIS — K5730 Diverticulosis of large intestine without perforation or abscess without bleeding: Secondary | ICD-10-CM | POA: Diagnosis not present

## 2020-07-22 DIAGNOSIS — Z79899 Other long term (current) drug therapy: Secondary | ICD-10-CM | POA: Insufficient documentation

## 2020-07-22 DIAGNOSIS — I4891 Unspecified atrial fibrillation: Secondary | ICD-10-CM | POA: Insufficient documentation

## 2020-07-22 DIAGNOSIS — E782 Mixed hyperlipidemia: Secondary | ICD-10-CM | POA: Diagnosis not present

## 2020-07-22 DIAGNOSIS — K635 Polyp of colon: Secondary | ICD-10-CM

## 2020-07-22 DIAGNOSIS — D120 Benign neoplasm of cecum: Secondary | ICD-10-CM | POA: Insufficient documentation

## 2020-07-22 DIAGNOSIS — Z950 Presence of cardiac pacemaker: Secondary | ICD-10-CM | POA: Diagnosis not present

## 2020-07-22 DIAGNOSIS — Z96612 Presence of left artificial shoulder joint: Secondary | ICD-10-CM | POA: Diagnosis not present

## 2020-07-22 DIAGNOSIS — M542 Cervicalgia: Secondary | ICD-10-CM | POA: Diagnosis not present

## 2020-07-22 DIAGNOSIS — R195 Other fecal abnormalities: Secondary | ICD-10-CM | POA: Diagnosis not present

## 2020-07-22 DIAGNOSIS — I482 Chronic atrial fibrillation, unspecified: Secondary | ICD-10-CM | POA: Diagnosis not present

## 2020-07-22 DIAGNOSIS — Z7901 Long term (current) use of anticoagulants: Secondary | ICD-10-CM | POA: Diagnosis not present

## 2020-07-22 HISTORY — PX: POLYPECTOMY: SHX5525

## 2020-07-22 HISTORY — PX: COLONOSCOPY WITH PROPOFOL: SHX5780

## 2020-07-22 LAB — GLUCOSE, CAPILLARY
Glucose-Capillary: 180 mg/dL — ABNORMAL HIGH (ref 70–99)
Glucose-Capillary: 149 mg/dL — ABNORMAL HIGH (ref 70–99)

## 2020-07-22 SURGERY — COLONOSCOPY WITH PROPOFOL
Anesthesia: General

## 2020-07-22 MED ORDER — PROPOFOL 500 MG/50ML IV EMUL
INTRAVENOUS | Status: DC | PRN
  Administered 2020-07-22: 150 ug/kg/min via INTRAVENOUS

## 2020-07-22 MED ORDER — PHENYLEPHRINE HCL (PRESSORS) 10 MG/ML IV SOLN
INTRAVENOUS | Status: DC | PRN
  Administered 2020-07-22 (×2): 100 ug via INTRAVENOUS

## 2020-07-22 MED ORDER — PROPOFOL 10 MG/ML IV BOLUS
INTRAVENOUS | Status: AC
  Filled 2020-07-22: qty 40

## 2020-07-22 MED ORDER — LACTATED RINGERS IV SOLN: INTRAVENOUS | Status: DC

## 2020-07-22 NOTE — H&P (Signed)
@LOGO @   Primary Care Physician:  Celene Squibb, MD Primary Gastroenterologist:  Dr. Gala Romney  Pre-Procedure History & Physical: HPI:  [name] is a 80 y.o. female here for further evaluation of positive Cologuard.  History of atrial fibrillation and CVA chronically anticoagulated on Coumadin.  Distant history of colon polyps removed elsewhere. Patient held her Coumadin times the last 2 days on her own.  Past Medical History:  Diagnosis Date  . A-fib ([place])   . Arthritis    spinal  . Chronic kidney disease   . Colon polyps   . CVA (cerebral vascular accident) ([place])    left sided weakness.  . Depression   . DM (diabetes mellitus) ([place])   . GERD (gastroesophageal reflux disease)   . HTN (hypertension)   . Hypercholesterolemia   . Hypothyroidism   . Pancreatitis    biliary  . S/P colonoscopy March 2006   Dr. Olevia Perches: per e-chart report: diverticulosis, path adenomatous polyps  . S/P endoscopy March 2009   multiple 3-4 mmsessile gastric polyps, benign, singl antral erosions, negative H.pylori, reactive gastropathy ? NSAIDs  . 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 CHOLECYSTECTOMY    . COLONOSCOPY  03/10/2011   Procedure: COLONOSCOPY;  Surgeon: Daneil Dolin, MD;  Location: [facility] END[facility] SUITE;  Service: Endoscopy;  Laterality: N/A;  12:45  . PACEMAKER INSERTION  02/05/12   SJM Accent DR RF implanted by Dr [name] for symptomatic bradycardia  . PERMANENT PACEMAKER INSERTION N/A 02/05/2012   Procedure: PERMANENT PACEMAKER INSERTION;  Surgeon: Thompson Grayer, MD;  Location: [facility] CATH LAB;  Service: Cardiovascular;  Laterality: N/A;  . SHOULDER SURGERY  2008   left shoulder replacement due to fracture     Prior to Admission medications   Medication Sig Start Date End Date Taking? Authorizing Provider  albuterol (PROVENTIL) (2.5 MG/3ML) 0.083% nebulizer solution Take 2.5 mg by nebulization every 4 (four)  hours as needed for wheezing or shortness of breath. Every 4-6 hours as needed 04/26/20  Yes [provider]  albuterol (VENTOLIN HFA) 108 (90 Base) MCG/ACT inhaler Inhale 2 puffs into the lungs every 6 (six) hours as needed for wheezing or shortness of breath. 04/16/20  Yes [provider]  allopurinol (ZYLOPRIM) 100 MG tablet Take 200 mg by mouth daily. 03/15/17  Yes [provider]  calcitRIOL (ROCALTROL) 0.25 MCG capsule Take 0.25-0.5 mcg by mouth See admin instructions. Take 2 tablets by mouth every other day and take 1 tablet daily on the other days   Yes [provider]  cholecalciferol (VITAMIN D3) 25 MCG (1000 UNIT) tablet Take 2,000 Units by mouth daily.   Yes [provider]  citalopram (CELEXA) 20 MG tablet Take 20 mg by mouth daily.   Yes [provider]  DHA-EPA-Flaxseed Oil-Vitamin E CAPS Take 1 capsule by mouth 2 (two) times daily.   Yes [provider]  flecainide (TAMBOCOR) 100 MG tablet Take 1 tablet (100 mg total) by mouth 2 (two) times daily. 05/02/19  Yes Allred, Jeneen Rinks, MD  furosemide (LASIX) 80 MG tablet Take 2 tablets (160 mg total) by mouth 2 (two) times daily. 05/03/20  Yes Barton Dubois, MD  gabapentin (NEURONTIN) 100 MG capsule Take 1 capsule (100 mg total) by mouth 3 (three) times daily. 03/25/15  Yes Carole Civil, MD  glipiZIDE (GLUCOTROL XL) 10 MG 24 hr tablet Take 10 mg by mouth daily.  Yes [provider]  HYDROcodone-acetaminophen (NORCO) 7.5-325 MG tablet Take 1 tablet by mouth 3 (three) times daily as needed for severe pain. 03/26/20  Yes [provider]  levothyroxine (SYNTHROID, LEVOTHROID) 75 MCG tablet Take 75 mcg by mouth daily before breakfast.   Yes [provider]  vitamin B-12 (CYANOCOBALAMIN) 1000 MCG tablet Take 5,000 mcg by mouth daily.   Yes [provider]  warfarin (COUMADIN) 5 MG tablet Take 2.5-5 mg by mouth See admin instructions. 5mg  Mon, Wed,  Fri, Sat; 2.5mg  other days 02/06/12  Yes Hope, Jessica A, PA-C  atorvastatin (LIPITOR) 40 MG tablet Take 0.5 tablets (20 mg total) by mouth every other day. Hold on to follow-up with PCP. Patient not taking: No sig reported 05/01/20   Barton Dubois, MD  dextromethorphan-guaiFENesin [facility] DM) 30-600 MG 12hr tablet Take 1 tablet by mouth 2 (two) times daily. Patient not taking: No sig reported 05/01/20   Barton Dubois, MD  fluticasone [facility]) 50 MCG/ACT nasal spray Place 2 sprays into the nose daily as needed. For congestion Patient not taking: No sig reported    [provider]  pantoprazole (PROTONIX) 40 MG tablet Take 1 tablet (40 mg total) by mouth daily. Patient not taking: No sig reported 05/02/20   Barton Dubois, MD  predniSONE (DELTASONE) 20 MG tablet Take 3 tablets by mouth daily x2 days; then 2 tablets by mouth daily x3 days; then 1 tablet by mouth daily x3 days; then half tablet by mouth daily x3 days and stop prednisone. Patient not taking: No sig reported 05/01/20   Barton Dubois, MD    Allergies as of 04/15/2020 - Review Complete 03/29/2020  Allergen Reaction Noted  . Other Hives 12/20/2011  . Penicillins Rash 04/22/2011  . Tetanus toxoids Rash 02/18/2011    Family History  Problem Relation Age of Onset  . Heart disease Other   . Arthritis Other   . Lung disease Other   . Diabetes Other   . Kidney disease Other   . Cancer Mother   . Heart disease Father   . Colon cancer Neg Hx     Social History   Socioeconomic History  . Marital status: Legally Separated    Spouse name: Not on file  . Number of children: Not on file  . Years of education: Not on file  . Highest education level: Not on file  Occupational History  . Not on file  Tobacco Use  . Smoking status: Never Smoker  . Smokeless tobacco: Never Used  Vaping Use  . Vaping Use: Never used  Substance and Sexual Activity  . Alcohol use: No  . Drug use: No  . Sexual activity: Not Currently   Other Topics Concern  . Not on file  Social History Narrative  . Not on file   Social Determinants of Health   Financial Resource Strain: Not on file  Food Insecurity: Not on file  Transportation Needs: Not on file  Physical Activity: Not on file  Stress: Not on file  Social Connections: Not on file  Intimate Partner Violence: Not on file    Review of Systems: See HPI, otherwise negative ROS  Physical Exam: BP (!) 154/69   Pulse 65   Temp 98.2 F (36.8 C) (Oral)   Resp 17   SpO2 94%  General:   Alert,  Well-developed, well-nourished, pleasant and cooperative in NAD Mouth:  No deformity or lesions. Neck:  Supple; no masses or thyromegaly. No significant cervical adenopathy. Lungs:  Clear throughout to auscultation.   No wheezes, crackles, or rhonchi. No acute distress. Heart:  Regular rate and rhythm; no murmurs, clicks, rubs,  or gallops. Abdomen: Non-distended, normal bowel sounds.  Soft and nontender without appreciable mass or hepatosplenomegaly.  Pulses:  Normal pulses noted. Extremities:  Without clubbing or edema.  Impression/Plan: 80 year old lady with a positive Cologuard.  Distant history of multiple colonic adenomas.  Chronically anticoagulated due to atrial fibrillation/CVA.  With a lengthy discussion in the office previously about holding Coumadin versus bridging therapy. On her own, patient has held her Coumadin for the past 2 days. Scenario could be problematic should large lesions be encountered today.  Risk benefits and limitations along with potential for subsequent procedure reviewed with the patient.  Questions been answered she is agreeable.     Notice: This dictation was prepared with Dragon dictation along with smaller phrase technology. Any transcriptional errors that result from this process are unintentional and may not be corrected upon review.

## 2020-07-22 NOTE — Anesthesia Postprocedure Evaluation (Signed)
Anesthesia Post Note  Patient: [name]  Procedure(s) Performed: COLONOSCOPY WITH PROPOFOL (N/A ) POLYPECTOMY  Patient location during evaluation: PACU Anesthesia Type: General Level of consciousness: awake, oriented, awake and alert and patient cooperative Pain management: satisfactory to patient Vital Signs Assessment: post-procedure vital signs reviewed and stable Respiratory status: spontaneous breathing, respiratory function stable and nonlabored ventilation Cardiovascular status: stable Postop Assessment: no apparent nausea or vomiting Anesthetic complications: no   No complications documented.   Last Vitals:  Vitals:   07/22/20 1008  BP: (!) 154/69  Pulse: 65  Resp: 17  Temp: 36.8 C  SpO2: 94%    Last Pain:  Vitals:   07/22/20 1051  TempSrc:   PainSc: 4                  Janelle Culton

## 2020-07-22 NOTE — Transfer of Care (Signed)
Immediate Anesthesia Transfer of Care Note  Patient: [name]  Procedure(s) Performed: COLONOSCOPY WITH PROPOFOL (N/A ) POLYPECTOMY  Patient Location: PACU  Anesthesia Type:General  Level of Consciousness: awake, alert , oriented and patient cooperative  Airway & Oxygen Therapy: Patient Spontanous Breathing  Post-op Assessment: Report given to RN, Post -op Vital signs reviewed and stable and Patient moving all extremities X 4  Post vital signs: Reviewed and stable  Last Vitals:  Vitals Value Taken Time  BP    Temp    Pulse 66 07/22/20 1131  Resp 3 07/22/20 1131  SpO2 99 % 07/22/20 1131  Vitals shown include unvalidated device data.  Last Pain:  Vitals:   07/22/20 1051  TempSrc:   PainSc: 4          Complications: No complications documented.

## 2020-07-22 NOTE — Discharge Instructions (Signed)
Colonoscopy Discharge Instructions  Read the instructions outlined below and refer to this sheet in the next few weeks. These discharge instructions provide you with general information on caring for yourself after you leave the hospital. Your doctor may also give you specific instructions. While your treatment has been planned according to the most current medical practices available, unavoidable complications occasionally occur. If you have any problems or questions after discharge, call Dr. Gala Romney at [phone]. ACTIVITY  You may resume your regular activity, but move at a slower pace for the next 24 hours.   Take frequent rest periods for the next 24 hours.   Walking will help get rid of the air and reduce the bloated feeling in your belly (abdomen).   No driving for 24 hours (because of the medicine (anesthesia) used during the test).    Do not sign any important legal documents or operate any machinery for 24 hours (because of the anesthesia used during the test).  NUTRITION  Drink plenty of fluids.   You may resume your normal diet as instructed by your doctor.   Begin with a light meal and progress to your normal diet. Heavy or fried foods are harder to digest and may make you feel sick to your stomach (nauseated).   Avoid alcoholic beverages for 24 hours or as instructed.  MEDICATIONS  You may resume your normal medications unless your doctor tells you otherwise.  WHAT YOU CAN EXPECT TODAY  Some feelings of bloating in the abdomen.   Passage of more gas than usual.   Spotting of blood in your stool or on the toilet paper.  IF YOU HAD POLYPS REMOVED DURING THE COLONOSCOPY:  No aspirin products for 7 days or as instructed.   No alcohol for 7 days or as instructed.   Eat a soft diet for the next 24 hours.  FINDING OUT THE RESULTS OF YOUR TEST Not all test results are available during your visit. If your test results are not back during the visit, make an appointment  with your caregiver to find out the results. Do not assume everything is normal if you have not heard from your caregiver or the medical facility. It is important for you to follow up on all of your test results.  SEEK IMMEDIATE MEDICAL ATTENTION IF:  You have more than a spotting of blood in your stool.   Your belly is swollen (abdominal distention).   You are nauseated or vomiting.   You have a temperature over 101.   You have abdominal pain or discomfort that is severe or gets worse throughout the day.    1 polyp removed in your colon today.  Colon polyp and diverticulosis information provided  Continue Coumadin uninterrupted  At patient request, I called Alisia Ferrari at [phone] -reviewed findings     Colon Polyps  Colon polyps are tissue growths inside the colon, which is part of the large intestine. They are one of the types of polyps that can grow in the body. A polyp may be a round bump or a mushroom-shaped growth. You could have one polyp or more than one. Most colon polyps are noncancerous (benign). However, some colon polyps can become cancerous over time. Finding and removing the polyps early can help prevent this. What are the causes? The exact cause of colon polyps is not known. What increases the risk? The following factors may make you more likely to develop this condition:  Having a family history of colorectal cancer or colon  polyps.  Being older than 79 years of age.  Being younger than 79 years of age and having a significant family history of colorectal cancer or colon polyps or a genetic condition that puts you at higher risk of getting colon polyps.  Having inflammatory bowel disease, such as ulcerative colitis or Crohn's disease.  Having certain conditions passed from parent to child (hereditary conditions), such as: ? Familial adenomatous polyposis (FAP). ? Lynch syndrome. ? Turcot syndrome. ? Peutz-Jeghers syndrome. ? MUTYH-associated  polyposis (MAP).  Being overweight.  Certain lifestyle factors. These include smoking cigarettes, drinking too much alcohol, not getting enough exercise, and eating a diet that is high in fat and red meat and low in fiber.  Having had childhood cancer that was treated with radiation of the abdomen. What are the signs or symptoms? Many times, there are no symptoms. If you have symptoms, they may include:  Blood coming from the rectum during a bowel movement.  Blood in the stool (feces). The blood may be bright red or very dark in color.  Pain in the abdomen.  A change in bowel habits, such as constipation or diarrhea. How is this diagnosed? This condition is diagnosed with a colonoscopy. This is a procedure in which a lighted, flexible scope is inserted into the opening between the buttocks (anus) and then passed into the colon to examine the area. Polyps are sometimes found when a colonoscopy is done as part of routine cancer screening tests. How is this treated? This condition is treated by removing any polyps that are found. Most polyps can be removed during a colonoscopy. Those polyps will then be tested for cancer. Additional treatment may be needed depending on the results of testing. Follow these instructions at home: Eating and drinking  Eat foods that are high in fiber, such as fruits, vegetables, and whole grains.  Eat foods that are high in calcium and vitamin D, such as milk, cheese, yogurt, eggs, liver, fish, and broccoli.  Limit foods that are high in fat, such as fried foods and desserts.  Limit the amount of red meat, precooked or cured meat, or other processed meat that you eat, such as hot dogs, sausages, bacon, or meat loaves.  Limit sugary drinks.   Lifestyle  Maintain a healthy weight, or lose weight if recommended by your health care provider.  Exercise every day or as told by your health care provider.  Do not use any products that contain nicotine or  tobacco, such as cigarettes, e-cigarettes, and chewing tobacco. If you need help quitting, ask your health care provider.  Do not drink alcohol if: ? Your health care provider tells you not to drink. ? You are pregnant, may be pregnant, or are planning to become pregnant.  If you drink alcohol: ? Limit how much you use to:  0-1 drink a day for women.  0-2 drinks a day for men. ? Know how much alcohol is in your drink. In the [place] one drink equals one 12 oz bottle of beer (355 mL), one 5 oz glass of wine (148 mL), or one 1 oz glass of hard liquor (44 mL). General instructions  Take over-the-counter and prescription medicines only as told by your health care provider.  Keep all follow-up visits. This is important. This includes having regularly scheduled colonoscopies. Talk to your health care provider about when you need a colonoscopy. Contact a health care provider if:  You have new or worsening bleeding during a bowel movement.  You  have new or increased blood in your stool.  You have a change in bowel habits.  You lose weight for no known reason. Summary  Colon polyps are tissue growths inside the colon, which is part of the large intestine. They are one type of polyp that can grow in the body.  Most colon polyps are noncancerous (benign), but some can become cancerous over time.  This condition is diagnosed with a colonoscopy.  This condition is treated by removing any polyps that are found. Most polyps can be removed during a colonoscopy. This information is not intended to replace advice given to you by your health care provider. Make sure you discuss any questions you have with your health care provider. Document Revised: 08/30/2019 Document Reviewed: 08/30/2019 Elsevier Patient Education  2021 [place].     Diverticulosis  Diverticulosis is a condition that develops when small pouches (diverticula) form in the wall of the large intestine (colon). The colon  is where water is absorbed and stool (feces) is formed. The pouches form when the inside layer of the colon pushes through weak spots in the outer layers of the colon. You may have a few pouches or many of them. The pouches usually do not cause problems unless they become inflamed or infected. When this happens, the condition is called diverticulitis. What are the causes? The cause of this condition is not known. What increases the risk? The following factors may make you more likely to develop this condition:  Being older than age 79. Your risk for this condition increases with age. Diverticulosis is rare among people younger than age 3. By age 32, many people have it.  Eating a low-fiber diet.  Having frequent constipation.  Being overweight.  Not getting enough exercise.  Smoking.  Taking over-the-counter pain medicines, like aspirin and ibuprofen.  Having a family history of diverticulosis. What are the signs or symptoms? In most people, there are no symptoms of this condition. If you do have symptoms, they may include:  Bloating.  Cramps in the abdomen.  Constipation or diarrhea.  Pain in the lower left side of the abdomen. How is this diagnosed? Because diverticulosis usually has no symptoms, it is most often diagnosed during an exam for other colon problems. The condition may be diagnosed by:  Using a flexible scope to examine the colon (colonoscopy).  Taking an X-ray of the colon after dye has been put into the colon (barium enema).  Having a CT scan. How is this treated? You may not need treatment for this condition. Your health care provider may recommend treatment to prevent problems. You may need treatment if you have symptoms or if you previously had diverticulitis. Treatment may include:  Eating a high-fiber diet.  Taking a fiber supplement.  Taking a live bacteria supplement (probiotic).  Taking medicine to relax your colon.   Follow these  instructions at home: Medicines  Take over-the-counter and prescription medicines only as told by your health care provider.  If told by your health care provider, take a fiber supplement or probiotic. Constipation prevention Your condition may cause constipation. To prevent or treat constipation, you may need to:  Drink enough fluid to keep your urine pale yellow.  Take over-the-counter or prescription medicines.  Eat foods that are high in fiber, such as beans, whole grains, and fresh fruits and vegetables.  Limit foods that are high in fat and processed sugars, such as fried or sweet foods.   General instructions  Try not to  strain when you have a bowel movement.  Keep all follow-up visits as told by your health care provider. This is important. Contact a health care provider if you:  Have pain in your abdomen.  Have bloating.  Have cramps.  Have not had a bowel movement in 3 days. Get help right away if:  Your pain gets worse.  Your bloating becomes very bad.  You have a fever or chills, and your symptoms suddenly get worse.  You vomit.  You have bowel movements that are bloody or black.  You have bleeding from your rectum. Summary  Diverticulosis is a condition that develops when small pouches (diverticula) form in the wall of the large intestine (colon).  You may have a few pouches or many of them.  This condition is most often diagnosed during an exam for other colon problems.  Treatment may include increasing the fiber in your diet, taking supplements, or taking medicines. This information is not intended to replace advice given to you by your health care provider. Make sure you discuss any questions you have with your health care provider. Document Revised: 12/08/2018 Document Reviewed: 12/08/2018 Elsevier Patient Education  2021 [place] After This sheet gives you information about how to care for yourself  after your procedure. Your health care provider may also give you more specific instructions. If you have problems or questions, contact your health care provider. What can I expect after the procedure? After the procedure, it is common to have:  Tiredness.  Forgetfulness about what happened after the procedure.  Impaired judgment for important decisions.  Nausea or vomiting.  Some difficulty with balance. Follow these instructions at home: For the time period you were told by your health care provider:  Rest as needed.  Do not participate in activities where you could fall or become injured.  Do not drive or use machinery.  Do not drink alcohol.  Do not take sleeping pills or medicines that cause drowsiness.  Do not make important decisions or sign legal documents.  Do not take care of children on your own.      Eating and drinking  Follow the diet that is recommended by your health care provider.  Drink enough fluid to keep your urine pale yellow.  If you vomit: ? Drink water, juice, or soup when you can drink without vomiting. ? Make sure you have little or no nausea before eating solid foods. General instructions  Have a responsible adult stay with you for the time you are told. It is important to have someone help care for you until you are awake and alert.  Take over-the-counter and prescription medicines only as told by your health care provider.  If you have sleep apnea, surgery and certain medicines can increase your risk for breathing problems. Follow instructions from your health care provider about wearing your sleep device: ? Anytime you are sleeping, including during daytime naps. ? While taking prescription pain medicines, sleeping medicines, or medicines that make you drowsy.  Avoid smoking.  Keep all follow-up visits as told by your health care provider. This is important. Contact a health care provider if:  You keep feeling nauseous or you keep  vomiting.  You feel light-headed.  You are still sleepy or having trouble with balance after 24 hours.  You develop a rash.  You have a fever.  You have redness or swelling around the IV site. Get help right away if:  You have trouble breathing.  You have new-onset confusion at home. Summary  For several hours after your procedure, you may feel tired. You may also be forgetful and have poor judgment.  Have a responsible adult stay with you for the time you are told. It is important to have someone help care for you until you are awake and alert.  Rest as told. Do not drive or operate machinery. Do not drink alcohol or take sleeping pills.  Get help right away if you have trouble breathing, or if you suddenly become confused. This information is not intended to replace advice given to you by your health care provider. Make sure you discuss any questions you have with your health care provider. Document Revised: 01/25/2020 Document Reviewed: 04/13/2019 Elsevier Patient Education  2021 [employer].

## 2020-07-22 NOTE — Op Note (Signed)
[facility] Patient Name: [name] Procedure Date: 07/22/2020 10:31 AM MRN: [id number] Date of Birth: 15-Jul-1940 Attending MD: Norvel Richards , MD CSN: [id number] Age: 80 Admit Type: Outpatient Procedure:                Colonoscopy Indications:              Positive Cologuard test Providers:                Norvel Richards, MD, Gwenlyn Fudge, RN, Aram Candela Referring MD:              Medicines:                Propofol per Anesthesia Complications:            No immediate complications. Estimated Blood Loss:     Estimated blood loss was minimal. Procedure:                Pre-Anesthesia Assessment:                           - Prior to the procedure, a History and Physical                            was performed, and patient medications and                            allergies were reviewed. The patient's tolerance of                            previous anesthesia was also reviewed. The risks                            and benefits of the procedure and the sedation                            options and risks were discussed with the patient.                            All questions were answered, and informed consent                            was obtained. Prior Anticoagulants: The patient                            last took Coumadin (warfarin) 2 days prior to the                            procedure. ASA Grade Assessment: III - A patient                            with severe systemic disease. After reviewing the  risks and benefits, the patient was deemed in                            satisfactory condition to undergo the procedure.                           After obtaining informed consent, the colonoscope                            was passed under direct vision. Throughout the                            procedure, the patient's blood pressure, pulse, and                            oxygen saturations were  monitored continuously. The                            [id number]-HQ190L ([id number]) scope was introduced through                            the anus and advanced to the the cecum, identified                            by appendiceal orifice and ileocecal valve. The                            colonoscopy was performed without difficulty. The                            patient tolerated the procedure well. The quality                            of the bowel preparation was adequate. Scope In: 10:55:45 AM Scope Out: 11:24:37 AM Scope Withdrawal Time: 0 hours 6 minutes 45 seconds  Total Procedure Duration: 0 hours 28 minutes 52 seconds  Findings:      The perianal and digital rectal examinations were normal.      Many small and large-mouthed diverticula were found in the sigmoid colon       and descending colon. Somewhat stiff left colon. I removed the adult       scope and obtained a pediatric colonoscope was able to advance into the       ascending colon. The right colon was redundant requiring changing of the       patient's position and external abdominal pressure to reach the cecum.      A 5 mm polyp was found in the ileocecal valve. The polyp was sessile.       The polyp was removed with a cold snare. Resection and retrieval were       complete. Estimated blood loss was minimal.      The retroflexed view of the distal rectum and anal verge was normal and       showed no anal or rectal abnormalities. Impression:               - Diverticulosis  in the sigmoid colon and in the                            descending colon. Noncompliant and redundant colon                           - One 5 mm polyp at the ileocecal valve, removed                            with a cold snare. Resected and retrieved.                           - The distal rectum and anal verge are normal on                            retroflexion view. Moderate Sedation:      Moderate (conscious) sedation was personally administered  by an       anesthesia professional. The following parameters were monitored: oxygen       saturation, heart rate, blood pressure, respiratory rate, EKG, adequacy       of pulmonary ventilation, and response to care. Recommendation:           - Patient has a contact number available for                            emergencies. The signs and symptoms of potential                            delayed complications were discussed with the                            patient. Return to normal activities tomorrow.                            Written discharge instructions were provided to the                            patient.                           - Advance diet as tolerated.                           - Continue present medications.                           - Repeat colonoscopy in 5 years for surveillance                            based on pathology results.                           - Return to GI office (date not yet determined). Procedure Code(s):        --- Professional ---  45385, Colonoscopy, flexible; with removal of                            tumor(s), polyp(s), or other lesion(s) by snare                            technique Diagnosis Code(s):        --- Professional ---                           K63.5, Polyp of colon                           R19.5, Other fecal abnormalities                           K57.30, Diverticulosis of large intestine without                            perforation or abscess without bleeding CPT copyright 2019 American Medical Association. All rights reserved. The codes documented in this report are preliminary and upon coder review may  be revised to meet current compliance requirements. Cristopher Estimable. Crisanto Nied, MD Norvel Richards, MD 07/22/2020 11:34:18 AM This report has been signed electronically. Number of Addenda: 0

## 2020-07-22 NOTE — Anesthesia Preprocedure Evaluation (Signed)
Anesthesia Evaluation  Patient identified by MRN, date of birth, ID band Patient awake    Reviewed: Allergy & Precautions, NPO status , Patient's Chart, lab work & pertinent test results  History of Anesthesia Complications (+) history of anesthetic complications  Airway Mallampati: II  TM Distance: >3 FB Neck ROM: Full    Dental  (+) Upper Dentures, Lower Dentures   Pulmonary pneumonia, resolved,    Pulmonary exam normal breath sounds clear to auscultation       Cardiovascular Exercise Tolerance: Good hypertension, Pt. on medications Normal cardiovascular exam+ dysrhythmias Atrial Fibrillation + pacemaker  Rhythm:Regular Rate:Normal     Neuro/Psych PSYCHIATRIC DISORDERS Depression CVA (Left sided weakness), Residual Symptoms    GI/Hepatic GERD  Medicated and Controlled,(+) Cirrhosis: high LFTs.      ,   Endo/Other  diabetes, Type 2Hypothyroidism   Renal/GU Renal InsufficiencyRenal disease     Musculoskeletal  (+) Arthritis ,   Abdominal   Peds  Hematology  (+) Blood dyscrasia (on coumadin), ,   Anesthesia Other Findings   Reproductive/Obstetrics                             Anesthesia Physical Anesthesia Plan  ASA: III  Anesthesia Plan: General   Post-op Pain Management:    Induction: Intravenous  PONV Risk Score and Plan: TIVA  Airway Management Planned: Nasal Cannula and Natural Airway  Additional Equipment:   Intra-op Plan:   Post-operative Plan:   Informed Consent: I have reviewed the patients History and Physical, chart, labs and discussed the procedure including the risks, benefits and alternatives for the proposed anesthesia with the patient or authorized representative who has indicated his/her understanding and acceptance.     Dental advisory given  Plan Discussed with: CRNA and Surgeon  Anesthesia Plan Comments:         Anesthesia Quick  Evaluation

## 2020-07-23 ENCOUNTER — Telehealth: Payer: Self-pay | Admitting: Internal Medicine

## 2020-07-23 LAB — SURGICAL PATHOLOGY

## 2020-07-23 NOTE — Telephone Encounter (Signed)
Thanks Carleene Overlie

## 2020-07-23 NOTE — Telephone Encounter (Signed)
New message    Patient would like to switch from [facility] to [facility] , she would like to be seen in [place] office . Please advise

## 2020-07-23 NOTE — Telephone Encounter (Signed)
No problem. GT

## 2020-07-24 ENCOUNTER — Encounter: Payer: Self-pay | Admitting: Internal Medicine

## 2020-07-25 ENCOUNTER — Encounter (HOSPITAL_COMMUNITY): Payer: Self-pay | Admitting: Internal Medicine

## 2020-07-26 DIAGNOSIS — R600 Localized edema: Secondary | ICD-10-CM | POA: Diagnosis not present

## 2020-07-26 DIAGNOSIS — E669 Obesity, unspecified: Secondary | ICD-10-CM | POA: Diagnosis not present

## 2020-07-26 DIAGNOSIS — G894 Chronic pain syndrome: Secondary | ICD-10-CM | POA: Diagnosis not present

## 2020-07-26 DIAGNOSIS — Z6830 Body mass index (BMI) 30.0-30.9, adult: Secondary | ICD-10-CM | POA: Diagnosis not present

## 2020-07-26 DIAGNOSIS — E1122 Type 2 diabetes mellitus with diabetic chronic kidney disease: Secondary | ICD-10-CM | POA: Diagnosis not present

## 2020-07-26 DIAGNOSIS — Z5181 Encounter for therapeutic drug level monitoring: Secondary | ICD-10-CM | POA: Diagnosis not present

## 2020-07-26 DIAGNOSIS — Z950 Presence of cardiac pacemaker: Secondary | ICD-10-CM | POA: Diagnosis not present

## 2020-07-26 DIAGNOSIS — Z712 Person consulting for explanation of examination or test findings: Secondary | ICD-10-CM | POA: Diagnosis not present

## 2020-07-26 DIAGNOSIS — M1710 Unilateral primary osteoarthritis, unspecified knee: Secondary | ICD-10-CM | POA: Diagnosis not present

## 2020-07-26 DIAGNOSIS — M25512 Pain in left shoulder: Secondary | ICD-10-CM | POA: Diagnosis not present

## 2020-07-26 DIAGNOSIS — J069 Acute upper respiratory infection, unspecified: Secondary | ICD-10-CM | POA: Diagnosis not present

## 2020-07-31 NOTE — Telephone Encounter (Signed)
Left message on vm for patient to schedule appointment with Dr Lovena Le njm

## 2020-08-05 DIAGNOSIS — Z950 Presence of cardiac pacemaker: Secondary | ICD-10-CM | POA: Diagnosis not present

## 2020-08-05 DIAGNOSIS — E1122 Type 2 diabetes mellitus with diabetic chronic kidney disease: Secondary | ICD-10-CM | POA: Diagnosis not present

## 2020-08-05 DIAGNOSIS — I482 Chronic atrial fibrillation, unspecified: Secondary | ICD-10-CM | POA: Diagnosis not present

## 2020-08-05 DIAGNOSIS — E039 Hypothyroidism, unspecified: Secondary | ICD-10-CM | POA: Diagnosis not present

## 2020-08-05 DIAGNOSIS — I129 Hypertensive chronic kidney disease with stage 1 through stage 4 chronic kidney disease, or unspecified chronic kidney disease: Secondary | ICD-10-CM | POA: Diagnosis not present

## 2020-08-05 DIAGNOSIS — E669 Obesity, unspecified: Secondary | ICD-10-CM | POA: Diagnosis not present

## 2020-08-05 DIAGNOSIS — E782 Mixed hyperlipidemia: Secondary | ICD-10-CM | POA: Diagnosis not present

## 2020-08-05 DIAGNOSIS — Z6830 Body mass index (BMI) 30.0-30.9, adult: Secondary | ICD-10-CM | POA: Diagnosis not present

## 2020-08-05 DIAGNOSIS — N1831 Chronic kidney disease, stage 3a: Secondary | ICD-10-CM | POA: Diagnosis not present

## 2020-08-05 DIAGNOSIS — E559 Vitamin D deficiency, unspecified: Secondary | ICD-10-CM | POA: Diagnosis not present

## 2020-08-07 DIAGNOSIS — I4821 Permanent atrial fibrillation: Secondary | ICD-10-CM | POA: Diagnosis not present

## 2020-08-21 DIAGNOSIS — I10 Essential (primary) hypertension: Secondary | ICD-10-CM | POA: Diagnosis not present

## 2020-08-21 DIAGNOSIS — M542 Cervicalgia: Secondary | ICD-10-CM | POA: Diagnosis not present

## 2020-08-21 DIAGNOSIS — E1165 Type 2 diabetes mellitus with hyperglycemia: Secondary | ICD-10-CM | POA: Diagnosis not present

## 2020-08-28 DIAGNOSIS — E1122 Type 2 diabetes mellitus with diabetic chronic kidney disease: Secondary | ICD-10-CM | POA: Diagnosis not present

## 2020-09-02 DIAGNOSIS — E785 Hyperlipidemia, unspecified: Secondary | ICD-10-CM | POA: Diagnosis not present

## 2020-09-02 DIAGNOSIS — I4891 Unspecified atrial fibrillation: Secondary | ICD-10-CM | POA: Diagnosis not present

## 2020-09-02 DIAGNOSIS — E1140 Type 2 diabetes mellitus with diabetic neuropathy, unspecified: Secondary | ICD-10-CM | POA: Diagnosis not present

## 2020-09-02 DIAGNOSIS — R69 Illness, unspecified: Secondary | ICD-10-CM | POA: Diagnosis not present

## 2020-09-02 DIAGNOSIS — I69354 Hemiplegia and hemiparesis following cerebral infarction affecting left non-dominant side: Secondary | ICD-10-CM | POA: Diagnosis not present

## 2020-09-02 DIAGNOSIS — E039 Hypothyroidism, unspecified: Secondary | ICD-10-CM | POA: Diagnosis not present

## 2020-09-02 DIAGNOSIS — G8929 Other chronic pain: Secondary | ICD-10-CM | POA: Diagnosis not present

## 2020-09-02 DIAGNOSIS — E663 Overweight: Secondary | ICD-10-CM | POA: Diagnosis not present

## 2020-09-02 DIAGNOSIS — D6869 Other thrombophilia: Secondary | ICD-10-CM | POA: Diagnosis not present

## 2020-09-03 DIAGNOSIS — I4821 Permanent atrial fibrillation: Secondary | ICD-10-CM | POA: Diagnosis not present

## 2020-09-10 ENCOUNTER — Ambulatory Visit: Payer: Medicare HMO | Admitting: Internal Medicine

## 2020-09-10 ENCOUNTER — Other Ambulatory Visit: Payer: Self-pay

## 2020-09-10 ENCOUNTER — Encounter: Payer: Self-pay | Admitting: Internal Medicine

## 2020-09-10 VITALS — BP 130/76 | HR 66 | Ht 65.5 in | Wt 183.0 lb

## 2020-09-10 DIAGNOSIS — I10 Essential (primary) hypertension: Secondary | ICD-10-CM | POA: Diagnosis not present

## 2020-09-10 DIAGNOSIS — R001 Bradycardia, unspecified: Secondary | ICD-10-CM | POA: Diagnosis not present

## 2020-09-10 DIAGNOSIS — I480 Paroxysmal atrial fibrillation: Secondary | ICD-10-CM | POA: Diagnosis not present

## 2020-09-10 DIAGNOSIS — I495 Sick sinus syndrome: Secondary | ICD-10-CM | POA: Diagnosis not present

## 2020-09-10 NOTE — Progress Notes (Signed)
HPI [name] is referred by Dr. [name] for ongoing evaluation and management of atrial fib , sinus node dysfunction, s/p PPM insertion, and HTN. In the interim, she notes some shoulder soreness but no chest pain or sob. No syncope. She is still working. Allergies  Allergen Reactions  . Fiorinal [Butalbital-Aspirin-Caffeine] Hives  . Penicillins Rash  . Tetanus Toxoids Rash     Current Outpatient Medications  Medication Sig Dispense Refill  . albuterol (PROVENTIL) (2.5 MG/3ML) 0.083% nebulizer solution Take 2.5 mg by nebulization every 4 (four) hours as needed for wheezing or shortness of breath. Every 4-6 hours as needed    . albuterol (VENTOLIN HFA) 108 (90 Base) MCG/ACT inhaler Inhale 2 puffs into the lungs every 6 (six) hours as needed for wheezing or shortness of breath.    . allopurinol (ZYLOPRIM) 100 MG tablet Take 200 mg by mouth daily.    Marland Kitchen atorvastatin (LIPITOR) 20 MG tablet Take 1 tablet by mouth daily.    . calcitRIOL (ROCALTROL) 0.25 MCG capsule Take 0.25-0.5 mcg by mouth See admin instructions. Take 2 tablets by mouth every other day and take 1 tablet daily on the other days    . cholecalciferol (VITAMIN D3) 25 MCG (1000 UNIT) tablet Take 2,000 Units by mouth daily.    . citalopram (CELEXA) 20 MG tablet Take 20 mg by mouth daily.    . DHA-EPA-Flaxseed Oil-Vitamin E CAPS Take 1 capsule by mouth 2 (two) times daily.    . flecainide (TAMBOCOR) 100 MG tablet Take 1 tablet (100 mg total) by mouth 2 (two) times daily. 180 tablet 3  . furosemide (LASIX) 80 MG tablet Take 2 tablets (160 mg total) by mouth 2 (two) times daily.    Marland Kitchen gabapentin (NEURONTIN) 100 MG capsule Take 1 capsule (100 mg total) by mouth 3 (three) times daily. 90 capsule 5  . glipiZIDE (GLUCOTROL XL) 10 MG 24 hr tablet Take 10 mg by mouth daily.    Marland Kitchen HYDROcodone-acetaminophen (NORCO) 7.5-325 MG tablet Take 1 tablet by mouth 3 (three) times daily as needed for severe pain.    Marland Kitchen levothyroxine (SYNTHROID,  LEVOTHROID) 75 MCG tablet Take 75 mcg by mouth daily before breakfast.    . vitamin B-12 (CYANOCOBALAMIN) 1000 MCG tablet Take 5,000 mcg by mouth daily.    Marland Kitchen warfarin (COUMADIN) 5 MG tablet Take 2.5-5 mg by mouth See admin instructions. 5mg  Mon, Wed, Fri, Sat; 2.5mg  other days     No current facility-administered medications for this visit.     Past Medical History:  Diagnosis Date  . A-fib ([place])   . Arthritis    spinal  . Chronic kidney disease   . Colon polyps   . CVA (cerebral vascular accident) ([place])    left sided weakness.  . Depression   . DM (diabetes mellitus) ([place])   . GERD (gastroesophageal reflux disease)   . HTN (hypertension)   . Hypercholesterolemia   . Hypothyroidism   . Pancreatitis    biliary  . S/P colonoscopy March 2006   Dr. Olevia Perches: per e-chart report: diverticulosis, path adenomatous polyps  . S/P endoscopy March 2009   multiple 3-4 mmsessile gastric polyps, benign, singl antral erosions, negative H.pylori, reactive gastropathy ? NSAIDs  . 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   Past Surgical History:  Procedure Laterality Date  . CHOLECYSTECTOMY    .  COLONOSCOPY  03/10/2011   Procedure: COLONOSCOPY;  Surgeon: Daneil Dolin, MD;  Location[facility];  Service: Endoscopy;  Laterality: N/A;  12:45  . COLONOSCOPY WITH PROPOFOL N/A 07/22/2020   Procedure: COLONOSCOPY WITH PROPOFOL;  Surgeon: Daneil Dolin, MD;  Location: [facility];  Service: Endoscopy;  Laterality: N/A;  10:30am  . PACEMAKER INSERTION  02/05/12   SJM Accent DR RF implanted by Dr [name] for symptomatic bradycardia  . PERMANENT PACEMAKER INSERTION N/A 02/05/2012   Procedure: PERMANENT PACEMAKER INSERTION;  Surgeon: Thompson Grayer, MD;  Location: [facility] CATH LAB;  Service: Cardiovascular;  Laterality: N/A;  . POLYPECTOMY  07/22/2020   Procedure: POLYPECTOMY;  Surgeon: Daneil Dolin, MD;   Location: [facility];  Service: Endoscopy;;  . SHOULDER SURGERY  2008   left shoulder replacement due to fracture      Family History  Problem Relation Age of Onset  . Heart disease Other   . Arthritis Other   . Lung disease Other   . Diabetes Other   . Kidney disease Other   . Cancer Mother   . Heart disease Father   . Colon cancer Neg Hx      Social History   Socioeconomic History  . Marital status: Legally Separated    Spouse name: Not on file  . Number of children: Not on file  . Years of education: Not on file  . Highest education level: Not on file  Occupational History  . Not on file  Tobacco Use  . Smoking status: Never Smoker  . Smokeless tobacco: Never Used  Vaping Use  . Vaping Use: Never used  Substance and Sexual Activity  . Alcohol use: No  . Drug use: No  . Sexual activity: Not Currently  Other Topics Concern  . Not on file  Social History Narrative  . Not on file   Social Determinants of Health   Financial Resource Strain: Not on file  Food Insecurity: Not on file  Transportation Needs: Not on file  Physical Activity: Not on file  Stress: Not on file  Social Connections: Not on file  Intimate Partner Violence: Not on file     BP 130/76   Pulse 66   Ht 5' 5.5" (1.664 m)   Wt 183 lb (83 kg)   SpO2 97%   BMI 29.99 kg/m   Physical Exam:  Well appearing NAD HEENT: Unremarkable Neck:  No JVD, no thyromegally Lymphatics:  No adenopathy Back:  No CVA tenderness Lungs:  Clear with no wheezes HEART:  Regular rate rhythm, no murmurs, no rubs, no clicks Abd:  soft, positive bowel sounds, no organomegally, no rebound, no guarding Ext:  2 plus pulses, no edema, no cyanosis, no clubbing Skin:  No rashes no nodules Neuro:  CN II through XII intact, motor grossly intact   DEVICE  Normal device function.  See PaceArt for details.   Assess/Plan: 1. Sinus node dysfunction - she is asymptomatic, s/p PPM insertion.  2. HTN - her bp is  well controlled.  3. PPM - her St. Jude device is working normally except her RV threshold is up a bit. We will follow. 4. PAF - she is maintaining NSR on flecainide. Continue warfarin.  Carleene Overlie Ludene Stokke,MD

## 2020-09-10 NOTE — Patient Instructions (Signed)
Medication Instructions:  Your physician recommends that you continue on your current medications as directed. Please refer to the Current Medication list given to you today.  *If you need a refill on your cardiac medications before your next appointment, please call your pharmacy*   Lab Work: None today  If you have labs (blood work) drawn today and your tests are completely normal, you will receive your results only by: . MyChart Message (if you have MyChart) OR . A paper copy in the mail If you have any lab test that is abnormal or we need to change your treatment, we will call you to review the results.   Testing/Procedures: None today    Follow-Up: At [facility], you and your health needs are our priority.  As part of our continuing mission to provide you with exceptional heart care, we have created designated Provider Care Teams.  These Care Teams include your primary Cardiologist (physician) and Advanced Practice Providers (APPs -  Physician Assistants and Nurse Practitioners) who all work together to provide you with the care you need, when you need it.  We recommend signing up for the patient portal called "MyChart".  Sign up information is provided on this After Visit Summary.  MyChart is used to connect with patients for Virtual Visits (Telemedicine).  Patients are able to view lab/test results, encounter notes, upcoming appointments, etc.  Non-urgent messages can be sent to your provider as well.   To learn more about what you can do with MyChart, go to [email or web address].    Your next appointment:   12 month(s)  The format for your next appointment:   In Person  Provider:   Gregg Taylor, MD   Other Instructions  None      

## 2020-09-17 ENCOUNTER — Ambulatory Visit (INDEPENDENT_AMBULATORY_CARE_PROVIDER_SITE_OTHER): Payer: Medicare HMO

## 2020-09-17 DIAGNOSIS — I480 Paroxysmal atrial fibrillation: Secondary | ICD-10-CM

## 2020-09-17 LAB — CUP PACEART REMOTE DEVICE CHECK
Battery Remaining Longevity: 31 mo
Battery Remaining Percentage: 29 %
Battery Voltage: 2.81 V
Brady Statistic AP VP Percent: 22 %
Brady Statistic AP VS Percent: 61 %
Brady Statistic AS VP Percent: 1 %
Brady Statistic AS VS Percent: 17 %
Brady Statistic RA Percent Paced: 83 %
Brady Statistic RV Percent Paced: 22 %
Date Time Interrogation Session: 20220426031807
Implantable Lead Implant Date: 20130913
Implantable Lead Implant Date: 20130913
Implantable Lead Location: 753859
Implantable Lead Location: 753860
Implantable Lead Model: 1948
Implantable Pulse Generator Implant Date: 20130913
Lead Channel Impedance Value: 1300 Ohm
Lead Channel Impedance Value: 490 Ohm
Lead Channel Pacing Threshold Amplitude: 0.75 V
Lead Channel Pacing Threshold Amplitude: 2 V
Lead Channel Pacing Threshold Pulse Width: 0.5 ms
Lead Channel Pacing Threshold Pulse Width: 0.8 ms
Lead Channel Sensing Intrinsic Amplitude: 12 mV
Lead Channel Sensing Intrinsic Amplitude: 2.3 mV
Lead Channel Setting Pacing Amplitude: 2 V
Lead Channel Setting Pacing Amplitude: 2.75 V
Lead Channel Setting Pacing Pulse Width: 0.8 ms
Lead Channel Setting Sensing Sensitivity: 2 mV
Pulse Gen Model: 2210
Pulse Gen Serial Number: 7382865

## 2020-10-03 DIAGNOSIS — I4821 Permanent atrial fibrillation: Secondary | ICD-10-CM | POA: Diagnosis not present

## 2020-10-07 NOTE — Progress Notes (Signed)
Remote pacemaker transmission.   

## 2020-10-15 DIAGNOSIS — D0471 Carcinoma in situ of skin of right lower limb, including hip: Secondary | ICD-10-CM | POA: Diagnosis not present

## 2020-10-15 DIAGNOSIS — X32XXXD Exposure to sunlight, subsequent encounter: Secondary | ICD-10-CM | POA: Diagnosis not present

## 2020-10-15 DIAGNOSIS — L570 Actinic keratosis: Secondary | ICD-10-CM | POA: Diagnosis not present

## 2020-10-15 DIAGNOSIS — Z85820 Personal history of malignant melanoma of skin: Secondary | ICD-10-CM | POA: Diagnosis not present

## 2020-10-15 DIAGNOSIS — Z08 Encounter for follow-up examination after completed treatment for malignant neoplasm: Secondary | ICD-10-CM | POA: Diagnosis not present

## 2020-10-15 DIAGNOSIS — D225 Melanocytic nevi of trunk: Secondary | ICD-10-CM | POA: Diagnosis not present

## 2020-10-15 DIAGNOSIS — Z1283 Encounter for screening for malignant neoplasm of skin: Secondary | ICD-10-CM | POA: Diagnosis not present

## 2020-10-31 DIAGNOSIS — I4821 Permanent atrial fibrillation: Secondary | ICD-10-CM | POA: Diagnosis not present

## 2020-11-04 ENCOUNTER — Other Ambulatory Visit: Payer: Self-pay

## 2020-11-04 ENCOUNTER — Ambulatory Visit: Payer: Medicare HMO | Admitting: Orthopedic Surgery

## 2020-11-04 ENCOUNTER — Encounter: Payer: Self-pay | Admitting: Orthopedic Surgery

## 2020-11-04 VITALS — BP 131/61 | HR 72 | Ht 65.5 in | Wt 177.6 lb

## 2020-11-04 DIAGNOSIS — M5136 Other intervertebral disc degeneration, lumbar region: Secondary | ICD-10-CM | POA: Diagnosis not present

## 2020-11-04 NOTE — Patient Instructions (Signed)
Home exercise program daily continue gabapentin increase if needed continue hydrocodone as needed

## 2020-11-04 NOTE — Progress Notes (Signed)
Chief Complaint  Patient presents with   Knee Pain    Right knee pain/ started 2 weeks ago and getting worse/also c/o lbp on right side    Encounter Diagnosis  Name Primary?   DDD (degenerative disc disease), lumbar Yes    80 year old female previously further neurosurgery and deemed her nonoperative came in today because of lower back right leg and thigh pain but was concerned that it may be her knee because the popping and cracking from her arthritis.  She had an episode of numbness in her toes which subsequently resolved she says she was out of her gabapentin and started taking it again her symptoms got better  Exam shows that she is tender across the lower back but her knee exam is quite benign  She has no numbness in her leg  She has discolored skin in her lower leg from chronic venous stasis disease and mild swelling  Straight leg raise is negative  Recommend titration of gabapentin up to 3 3 times a day as needed continue hydrocodone, home exercise program follow-up as needed

## 2020-11-05 DIAGNOSIS — L570 Actinic keratosis: Secondary | ICD-10-CM | POA: Diagnosis not present

## 2020-11-05 DIAGNOSIS — H16223 Keratoconjunctivitis sicca, not specified as Sjogren's, bilateral: Secondary | ICD-10-CM | POA: Diagnosis not present

## 2020-11-05 DIAGNOSIS — H2513 Age-related nuclear cataract, bilateral: Secondary | ICD-10-CM | POA: Diagnosis not present

## 2020-11-05 DIAGNOSIS — X32XXXD Exposure to sunlight, subsequent encounter: Secondary | ICD-10-CM | POA: Diagnosis not present

## 2020-11-05 DIAGNOSIS — Z7984 Long term (current) use of oral hypoglycemic drugs: Secondary | ICD-10-CM | POA: Diagnosis not present

## 2020-11-05 DIAGNOSIS — H524 Presbyopia: Secondary | ICD-10-CM | POA: Diagnosis not present

## 2020-11-05 DIAGNOSIS — E119 Type 2 diabetes mellitus without complications: Secondary | ICD-10-CM | POA: Diagnosis not present

## 2020-11-06 DIAGNOSIS — I10 Essential (primary) hypertension: Secondary | ICD-10-CM | POA: Diagnosis not present

## 2020-11-06 DIAGNOSIS — E1122 Type 2 diabetes mellitus with diabetic chronic kidney disease: Secondary | ICD-10-CM | POA: Diagnosis not present

## 2020-11-13 DIAGNOSIS — I4891 Unspecified atrial fibrillation: Secondary | ICD-10-CM | POA: Diagnosis not present

## 2020-11-13 DIAGNOSIS — E1122 Type 2 diabetes mellitus with diabetic chronic kidney disease: Secondary | ICD-10-CM | POA: Diagnosis not present

## 2020-11-13 DIAGNOSIS — E039 Hypothyroidism, unspecified: Secondary | ICD-10-CM | POA: Diagnosis not present

## 2020-11-13 DIAGNOSIS — E782 Mixed hyperlipidemia: Secondary | ICD-10-CM | POA: Diagnosis not present

## 2020-11-13 DIAGNOSIS — I10 Essential (primary) hypertension: Secondary | ICD-10-CM | POA: Diagnosis not present

## 2020-11-27 DIAGNOSIS — E1122 Type 2 diabetes mellitus with diabetic chronic kidney disease: Secondary | ICD-10-CM | POA: Diagnosis not present

## 2020-11-27 DIAGNOSIS — I4821 Permanent atrial fibrillation: Secondary | ICD-10-CM | POA: Diagnosis not present

## 2020-12-17 ENCOUNTER — Ambulatory Visit (INDEPENDENT_AMBULATORY_CARE_PROVIDER_SITE_OTHER): Payer: Medicare HMO

## 2020-12-17 DIAGNOSIS — I495 Sick sinus syndrome: Secondary | ICD-10-CM

## 2020-12-20 LAB — CUP PACEART REMOTE DEVICE CHECK
Battery Remaining Longevity: 13 mo
Battery Remaining Percentage: 13 %
Battery Voltage: 2.8 V
Brady Statistic AP VP Percent: 30 %
Brady Statistic AP VS Percent: 57 %
Brady Statistic AS VP Percent: 1 %
Brady Statistic AS VS Percent: 12 %
Brady Statistic RA Percent Paced: 88 %
Brady Statistic RV Percent Paced: 30 %
Date Time Interrogation Session: 20220726034856
Implantable Lead Implant Date: 20130913
Implantable Lead Implant Date: 20130913
Implantable Lead Location: 753859
Implantable Lead Location: 753860
Implantable Lead Model: 1948
Implantable Pulse Generator Implant Date: 20130913
Lead Channel Impedance Value: 1375 Ohm
Lead Channel Impedance Value: 530 Ohm
Lead Channel Pacing Threshold Amplitude: 0.75 V
Lead Channel Pacing Threshold Amplitude: 2 V
Lead Channel Pacing Threshold Pulse Width: 0.5 ms
Lead Channel Pacing Threshold Pulse Width: 0.8 ms
Lead Channel Sensing Intrinsic Amplitude: 1.6 mV
Lead Channel Sensing Intrinsic Amplitude: 12 mV
Lead Channel Setting Pacing Amplitude: 2 V
Lead Channel Setting Pacing Amplitude: 2.75 V
Lead Channel Setting Pacing Pulse Width: 0.8 ms
Lead Channel Setting Sensing Sensitivity: 2 mV
Pulse Gen Model: 2210
Pulse Gen Serial Number: 7382865

## 2020-12-24 DIAGNOSIS — X32XXXD Exposure to sunlight, subsequent encounter: Secondary | ICD-10-CM | POA: Diagnosis not present

## 2020-12-24 DIAGNOSIS — L03115 Cellulitis of right lower limb: Secondary | ICD-10-CM | POA: Diagnosis not present

## 2020-12-24 DIAGNOSIS — L570 Actinic keratosis: Secondary | ICD-10-CM | POA: Diagnosis not present

## 2020-12-27 DIAGNOSIS — I4821 Permanent atrial fibrillation: Secondary | ICD-10-CM | POA: Diagnosis not present

## 2021-01-10 NOTE — Progress Notes (Signed)
Remote pacemaker transmission.   

## 2021-01-13 DIAGNOSIS — I10 Essential (primary) hypertension: Secondary | ICD-10-CM | POA: Diagnosis not present

## 2021-01-13 DIAGNOSIS — E1122 Type 2 diabetes mellitus with diabetic chronic kidney disease: Secondary | ICD-10-CM | POA: Diagnosis not present

## 2021-01-15 DIAGNOSIS — N183 Chronic kidney disease, stage 3 unspecified: Secondary | ICD-10-CM | POA: Diagnosis not present

## 2021-01-15 DIAGNOSIS — I480 Paroxysmal atrial fibrillation: Secondary | ICD-10-CM | POA: Diagnosis not present

## 2021-01-15 DIAGNOSIS — E669 Obesity, unspecified: Secondary | ICD-10-CM | POA: Diagnosis not present

## 2021-01-15 DIAGNOSIS — E1122 Type 2 diabetes mellitus with diabetic chronic kidney disease: Secondary | ICD-10-CM | POA: Diagnosis not present

## 2021-01-15 DIAGNOSIS — M109 Gout, unspecified: Secondary | ICD-10-CM | POA: Diagnosis not present

## 2021-01-15 DIAGNOSIS — E782 Mixed hyperlipidemia: Secondary | ICD-10-CM | POA: Diagnosis not present

## 2021-01-15 DIAGNOSIS — N2581 Secondary hyperparathyroidism of renal origin: Secondary | ICD-10-CM | POA: Diagnosis not present

## 2021-01-15 DIAGNOSIS — I129 Hypertensive chronic kidney disease with stage 1 through stage 4 chronic kidney disease, or unspecified chronic kidney disease: Secondary | ICD-10-CM | POA: Diagnosis not present

## 2021-01-20 DIAGNOSIS — G894 Chronic pain syndrome: Secondary | ICD-10-CM | POA: Insufficient documentation

## 2021-01-22 DIAGNOSIS — E1165 Type 2 diabetes mellitus with hyperglycemia: Secondary | ICD-10-CM | POA: Diagnosis not present

## 2021-01-22 DIAGNOSIS — M1990 Unspecified osteoarthritis, unspecified site: Secondary | ICD-10-CM | POA: Diagnosis not present

## 2021-01-28 DIAGNOSIS — C44629 Squamous cell carcinoma of skin of left upper limb, including shoulder: Secondary | ICD-10-CM | POA: Diagnosis not present

## 2021-01-28 DIAGNOSIS — L570 Actinic keratosis: Secondary | ICD-10-CM | POA: Diagnosis not present

## 2021-01-28 DIAGNOSIS — X32XXXD Exposure to sunlight, subsequent encounter: Secondary | ICD-10-CM | POA: Diagnosis not present

## 2021-01-30 DIAGNOSIS — H01004 Unspecified blepharitis left upper eyelid: Secondary | ICD-10-CM | POA: Diagnosis not present

## 2021-01-30 DIAGNOSIS — H25813 Combined forms of age-related cataract, bilateral: Secondary | ICD-10-CM | POA: Diagnosis not present

## 2021-01-30 DIAGNOSIS — H01002 Unspecified blepharitis right lower eyelid: Secondary | ICD-10-CM | POA: Diagnosis not present

## 2021-01-30 DIAGNOSIS — H01001 Unspecified blepharitis right upper eyelid: Secondary | ICD-10-CM | POA: Diagnosis not present

## 2021-02-04 DIAGNOSIS — I4821 Permanent atrial fibrillation: Secondary | ICD-10-CM | POA: Diagnosis not present

## 2021-02-13 DIAGNOSIS — E039 Hypothyroidism, unspecified: Secondary | ICD-10-CM | POA: Diagnosis not present

## 2021-02-13 DIAGNOSIS — E1122 Type 2 diabetes mellitus with diabetic chronic kidney disease: Secondary | ICD-10-CM | POA: Diagnosis not present

## 2021-02-13 DIAGNOSIS — I10 Essential (primary) hypertension: Secondary | ICD-10-CM | POA: Diagnosis not present

## 2021-02-20 DIAGNOSIS — E782 Mixed hyperlipidemia: Secondary | ICD-10-CM | POA: Diagnosis not present

## 2021-02-20 DIAGNOSIS — E1122 Type 2 diabetes mellitus with diabetic chronic kidney disease: Secondary | ICD-10-CM | POA: Diagnosis not present

## 2021-02-20 DIAGNOSIS — I4891 Unspecified atrial fibrillation: Secondary | ICD-10-CM | POA: Diagnosis not present

## 2021-02-20 DIAGNOSIS — E039 Hypothyroidism, unspecified: Secondary | ICD-10-CM | POA: Diagnosis not present

## 2021-02-20 DIAGNOSIS — I10 Essential (primary) hypertension: Secondary | ICD-10-CM | POA: Diagnosis not present

## 2021-02-20 DIAGNOSIS — N1831 Chronic kidney disease, stage 3a: Secondary | ICD-10-CM | POA: Diagnosis not present

## 2021-02-20 DIAGNOSIS — Z23 Encounter for immunization: Secondary | ICD-10-CM | POA: Diagnosis not present

## 2021-02-20 DIAGNOSIS — N2581 Secondary hyperparathyroidism of renal origin: Secondary | ICD-10-CM | POA: Diagnosis not present

## 2021-02-20 DIAGNOSIS — M542 Cervicalgia: Secondary | ICD-10-CM | POA: Diagnosis not present

## 2021-02-20 DIAGNOSIS — E876 Hypokalemia: Secondary | ICD-10-CM | POA: Diagnosis not present

## 2021-02-21 DIAGNOSIS — M1990 Unspecified osteoarthritis, unspecified site: Secondary | ICD-10-CM | POA: Diagnosis not present

## 2021-02-21 DIAGNOSIS — E1165 Type 2 diabetes mellitus with hyperglycemia: Secondary | ICD-10-CM | POA: Diagnosis not present

## 2021-02-27 DIAGNOSIS — E1122 Type 2 diabetes mellitus with diabetic chronic kidney disease: Secondary | ICD-10-CM | POA: Diagnosis not present

## 2021-03-05 DIAGNOSIS — I4821 Permanent atrial fibrillation: Secondary | ICD-10-CM | POA: Diagnosis not present

## 2021-03-15 DIAGNOSIS — I6381 Other cerebral infarction due to occlusion or stenosis of small artery: Secondary | ICD-10-CM | POA: Diagnosis not present

## 2021-03-15 DIAGNOSIS — M542 Cervicalgia: Secondary | ICD-10-CM | POA: Diagnosis not present

## 2021-03-15 DIAGNOSIS — Z8679 Personal history of other diseases of the circulatory system: Secondary | ICD-10-CM | POA: Diagnosis not present

## 2021-03-15 DIAGNOSIS — T45515A Adverse effect of anticoagulants, initial encounter: Secondary | ICD-10-CM | POA: Diagnosis not present

## 2021-03-15 DIAGNOSIS — Z950 Presence of cardiac pacemaker: Secondary | ICD-10-CM | POA: Diagnosis not present

## 2021-03-15 DIAGNOSIS — M79642 Pain in left hand: Secondary | ICD-10-CM | POA: Diagnosis not present

## 2021-03-15 DIAGNOSIS — S01112A Laceration without foreign body of left eyelid and periocular area, initial encounter: Secondary | ICD-10-CM | POA: Diagnosis not present

## 2021-03-15 DIAGNOSIS — M545 Low back pain, unspecified: Secondary | ICD-10-CM | POA: Diagnosis not present

## 2021-03-15 DIAGNOSIS — I4891 Unspecified atrial fibrillation: Secondary | ICD-10-CM | POA: Diagnosis not present

## 2021-03-15 DIAGNOSIS — E119 Type 2 diabetes mellitus without complications: Secondary | ICD-10-CM | POA: Diagnosis not present

## 2021-03-18 ENCOUNTER — Ambulatory Visit (INDEPENDENT_AMBULATORY_CARE_PROVIDER_SITE_OTHER): Payer: Medicare HMO

## 2021-03-18 DIAGNOSIS — I495 Sick sinus syndrome: Secondary | ICD-10-CM | POA: Diagnosis not present

## 2021-03-18 LAB — CUP PACEART REMOTE DEVICE CHECK
Battery Remaining Longevity: 11 mo
Battery Remaining Percentage: 10 %
Battery Voltage: 2.77 V
Brady Statistic AP VP Percent: 32 %
Brady Statistic AP VS Percent: 58 %
Brady Statistic AS VP Percent: 1 %
Brady Statistic AS VS Percent: 10 %
Brady Statistic RA Percent Paced: 89 %
Brady Statistic RV Percent Paced: 32 %
Date Time Interrogation Session: 20221025021901
Implantable Lead Implant Date: 20130913
Implantable Lead Implant Date: 20130913
Implantable Lead Location: 753859
Implantable Lead Location: 753860
Implantable Lead Model: 1948
Implantable Pulse Generator Implant Date: 20130913
Lead Channel Impedance Value: 1375 Ohm
Lead Channel Impedance Value: 510 Ohm
Lead Channel Pacing Threshold Amplitude: 0.75 V
Lead Channel Pacing Threshold Amplitude: 2 V
Lead Channel Pacing Threshold Pulse Width: 0.5 ms
Lead Channel Pacing Threshold Pulse Width: 0.8 ms
Lead Channel Sensing Intrinsic Amplitude: 12 mV
Lead Channel Sensing Intrinsic Amplitude: 2.3 mV
Lead Channel Setting Pacing Amplitude: 2 V
Lead Channel Setting Pacing Amplitude: 2.75 V
Lead Channel Setting Pacing Pulse Width: 0.8 ms
Lead Channel Setting Sensing Sensitivity: 2 mV
Pulse Gen Model: 2210
Pulse Gen Serial Number: 7382865

## 2021-03-24 DIAGNOSIS — E1165 Type 2 diabetes mellitus with hyperglycemia: Secondary | ICD-10-CM | POA: Diagnosis not present

## 2021-03-24 DIAGNOSIS — M1990 Unspecified osteoarthritis, unspecified site: Secondary | ICD-10-CM | POA: Diagnosis not present

## 2021-03-26 NOTE — Progress Notes (Signed)
Remote pacemaker transmission.   

## 2021-03-28 DIAGNOSIS — Z0001 Encounter for general adult medical examination with abnormal findings: Secondary | ICD-10-CM | POA: Diagnosis not present

## 2021-03-28 DIAGNOSIS — W19XXXA Unspecified fall, initial encounter: Secondary | ICD-10-CM | POA: Insufficient documentation

## 2021-03-28 DIAGNOSIS — R296 Repeated falls: Secondary | ICD-10-CM | POA: Diagnosis not present

## 2021-03-28 DIAGNOSIS — S0182XA Laceration with foreign body of other part of head, initial encounter: Secondary | ICD-10-CM | POA: Diagnosis not present

## 2021-03-28 DIAGNOSIS — Z4802 Encounter for removal of sutures: Secondary | ICD-10-CM | POA: Diagnosis not present

## 2021-03-31 ENCOUNTER — Encounter (HOSPITAL_COMMUNITY): Payer: Self-pay

## 2021-03-31 ENCOUNTER — Encounter (HOSPITAL_COMMUNITY)
Admission: RE | Admit: 2021-03-31 | Discharge: 2021-03-31 | Disposition: A | Discharge status: Home / Self Care | Payer: Medicare HMO | Source: Ambulatory Visit | Attending: Ophthalmology | Admitting: Ophthalmology

## 2021-03-31 ENCOUNTER — Other Ambulatory Visit: Payer: Self-pay

## 2021-03-31 NOTE — Pre-Procedure Instructions (Signed)
Mackie Pai called back and has spoken with Dr Doreatha Lew and he feels patient will be fine to have procedure on left eye on 11/14. Butch Penny has spoken with patient who agrees. Patient called and Pre-op assessment was done.

## 2021-03-31 NOTE — Pre-Procedure Instructions (Signed)
Called for pre-op history. Patient states she fell and hit her left eye (which is the operative eye to be done 04/07/2021), and she had 3 stitches to the left brow bone. She is afraid to have surgery on this eye due to the recent injury. She wants to see if she can have the right eye done first. I called Mackie Pai, surgical coordinator for Dr Micki Riley and left her a message to contact patient to see what can be done for her. Patient aware and awaiting phone call from [place].

## 2021-04-03 DIAGNOSIS — H25812 Combined forms of age-related cataract, left eye: Secondary | ICD-10-CM | POA: Diagnosis not present

## 2021-04-03 NOTE — H&P (Signed)
Surgical History & Physical  Patient Name: [name] DOB: April 08, 1941  Surgery: Cataract extraction with intraocular lens implant phacoemulsification; Left Eye  Surgeon: Baruch Goldmann MD Surgery Date:  04-07-21 Pre-Op Date:  04-03-21  HPI: A 72 Yr. old female patient is referred by Dr [name] for cataract eval. 1. The patient complains of difficulty when working on computer, which began 6 months ago. Both eyes are affected. The episode is gradual. The condition's severity increased since last visit. Symptoms occur when the patient is inside. The complaint is associated with glare. This is negatively affecting the patient's quality of life. c/o left eye burning, using Systane. HPI Completed by Dr. Baruch Goldmann  Medical History: Cataracts Hypertensive Retinopathy, OU: Keratoconjunctivitis sicca, [id number] Anxiety disorder, Depressive disorder, End-stage r... Diabetes Heart Problem High Blood Pressure Thyroid Problems  Review of Systems Negative Allergic/Immunologic Negative Cardiovascular Negative Constitutional Negative Ear, Nose, Mouth & Throat Negative Endocrine Negative Eyes Negative Gastrointestinal Negative Genitourinary Negative Hemotologic/Lymphatic Negative Integumentary Negative Musculoskeletal Negative Neurological Negative Psychiatry Negative Respiratory  Social   Never smoked   Medication  Mupirocin, Albuterol, Inhaler, Allopurinol, Atorvastatin, Citalopram, Flecainide, Furosemide, Gabapentin, Glipizide, Hydrocodone, Levothyroxine Sodium, Pantoprazole, Warfarin,   Sx/Procedures Cardiac pacemaker, Excision of melanoma, Cholecystectomy, Gallbladder, Shoulder,   Drug Allergies  Pencilin, Allopurinol, Tetanus, Florinal,   History & Physical: Heent: Cataract, Left Eye NECK: supple without bruits LUNGS: lungs clear to auscultation CV: regular rate and rhythm Abdomen: soft and non-tender  Impression & Plan: Assessment: 1.  COMBINED FORMS AGE  RELATED CATARACT; Both Eyes (H25.813) 2.  BLEPHARITIS; Right Upper Lid, Right Lower Lid, Left Upper Lid, Left Lower Lid (H01.001, H01.002,H01.004,H01.005) 3.  CONJUNCTIVOCHALASIS; Both Eyes (H11.823) 4.  Pinguecula; Both Eyes (H11.153)  Plan: 1.  Cataract accounts for the patient's decreased vision. This visual impairment is not correctable with a tolerable change in glasses or contact lenses. Cataract surgery with an implantation of a new lens should significantly improve the visual and functional status of the patient. Discussed all risks, benefits, alternatives, and potential complications. Discussed the procedures and recovery. Patient desires to have surgery. A-scan ordered and performed today for intra-ocular lens calculations. The surgery will be performed in order to improve vision for driving, reading, and for eye examinations. Recommend phacoemulsification with intra-ocular lens. Recommend Dextenza for post-operative pain and inflammation. Left Eye worse - first. Dilates well - shugarcaine by protocol.  2.  Recommend regular lid cleaning.  3.  Asymptomatic. Symptomatic.  4.  Observe; Artificial tears as needed for irritation.

## 2021-04-04 ENCOUNTER — Telehealth: Payer: Self-pay | Admitting: Orthopedic Surgery

## 2021-04-04 DIAGNOSIS — I4821 Permanent atrial fibrillation: Secondary | ICD-10-CM | POA: Diagnosis not present

## 2021-04-04 NOTE — Telephone Encounter (Signed)
Patient called to relay that she was treated at [facility] for "broken fingers" of left hand; states splinting was done there. Discussed appointment, which relayed we can schedule upon her retrieving the provider notes, Xray CD, and Xray report. Patient was in an area where cell phone connection was poor; she voiced understanding of what she needs to request; said will call back to schedule. States would like to see Dr Aline Brochure if possible. Appointment pending.

## 2021-04-07 ENCOUNTER — Encounter (HOSPITAL_COMMUNITY)
Admission: RE | Disposition: A | Discharge status: Home / Self Care | Payer: Self-pay | Source: Home / Self Care | Attending: Ophthalmology

## 2021-04-07 ENCOUNTER — Ambulatory Visit (HOSPITAL_COMMUNITY): Payer: Medicare HMO | Admitting: Certified Registered Nurse Anesthetist

## 2021-04-07 ENCOUNTER — Encounter (HOSPITAL_COMMUNITY): Payer: Self-pay | Admitting: Ophthalmology

## 2021-04-07 ENCOUNTER — Ambulatory Visit (HOSPITAL_COMMUNITY)
Admission: RE | Admit: 2021-04-07 | Discharge: 2021-04-07 | Disposition: A | Discharge status: Home / Self Care | Payer: Medicare HMO | Attending: Ophthalmology | Admitting: Ophthalmology

## 2021-04-07 DIAGNOSIS — H11153 Pinguecula, bilateral: Secondary | ICD-10-CM | POA: Diagnosis not present

## 2021-04-07 DIAGNOSIS — E1122 Type 2 diabetes mellitus with diabetic chronic kidney disease: Secondary | ICD-10-CM | POA: Insufficient documentation

## 2021-04-07 DIAGNOSIS — H0100B Unspecified blepharitis left eye, upper and lower eyelids: Secondary | ICD-10-CM | POA: Insufficient documentation

## 2021-04-07 DIAGNOSIS — K219 Gastro-esophageal reflux disease without esophagitis: Secondary | ICD-10-CM | POA: Diagnosis not present

## 2021-04-07 DIAGNOSIS — Z950 Presence of cardiac pacemaker: Secondary | ICD-10-CM | POA: Diagnosis not present

## 2021-04-07 DIAGNOSIS — H0100A Unspecified blepharitis right eye, upper and lower eyelids: Secondary | ICD-10-CM | POA: Insufficient documentation

## 2021-04-07 DIAGNOSIS — H25812 Combined forms of age-related cataract, left eye: Secondary | ICD-10-CM | POA: Insufficient documentation

## 2021-04-07 DIAGNOSIS — Z8673 Personal history of transient ischemic attack (TIA), and cerebral infarction without residual deficits: Secondary | ICD-10-CM | POA: Diagnosis not present

## 2021-04-07 DIAGNOSIS — N189 Chronic kidney disease, unspecified: Secondary | ICD-10-CM | POA: Diagnosis not present

## 2021-04-07 DIAGNOSIS — H11823 Conjunctivochalasis, bilateral: Secondary | ICD-10-CM | POA: Diagnosis not present

## 2021-04-07 DIAGNOSIS — E039 Hypothyroidism, unspecified: Secondary | ICD-10-CM | POA: Insufficient documentation

## 2021-04-07 DIAGNOSIS — Z79899 Other long term (current) drug therapy: Secondary | ICD-10-CM | POA: Insufficient documentation

## 2021-04-07 DIAGNOSIS — E1136 Type 2 diabetes mellitus with diabetic cataract: Secondary | ICD-10-CM | POA: Insufficient documentation

## 2021-04-07 DIAGNOSIS — J219 Acute bronchiolitis, unspecified: Secondary | ICD-10-CM | POA: Diagnosis not present

## 2021-04-07 DIAGNOSIS — I129 Hypertensive chronic kidney disease with stage 1 through stage 4 chronic kidney disease, or unspecified chronic kidney disease: Secondary | ICD-10-CM | POA: Diagnosis not present

## 2021-04-07 HISTORY — PX: CATARACT EXTRACTION W/PHACO: SHX586

## 2021-04-07 LAB — GLUCOSE, CAPILLARY: Glucose-Capillary: 99 mg/dL (ref 70–99)

## 2021-04-07 SURGERY — PHACOEMULSIFICATION, CATARACT, WITH IOL INSERTION
Anesthesia: Monitor Anesthesia Care | Site: Eye | Laterality: Left

## 2021-04-07 MED ORDER — LIDOCAINE HCL (PF) 1 % IJ SOLN
INTRAOCULAR | Status: DC | PRN
  Administered 2021-04-07: 1 mL via OPHTHALMIC

## 2021-04-07 MED ORDER — EPINEPHRINE PF 1 MG/ML IJ SOLN
INTRAOCULAR | Status: DC | PRN
  Administered 2021-04-07: 500 mL

## 2021-04-07 MED ORDER — EPINEPHRINE PF 1 MG/ML IJ SOLN
INTRAMUSCULAR | Status: AC
  Filled 2021-04-07: qty 2

## 2021-04-07 MED ORDER — LIDOCAINE HCL 3.5 % OP GEL
1.0000 "application " | Freq: Once | OPHTHALMIC | Status: AC
  Administered 2021-04-07: 1 via OPHTHALMIC

## 2021-04-07 MED ORDER — BSS IO SOLN
INTRAOCULAR | Status: DC | PRN
  Administered 2021-04-07: 15 mL

## 2021-04-07 MED ORDER — PHENYLEPHRINE HCL 2.5 % OP SOLN
1.0000 [drp] | OPHTHALMIC | Status: AC | PRN
  Administered 2021-04-07 (×3): 1 [drp] via OPHTHALMIC

## 2021-04-07 MED ORDER — NEOMYCIN-POLYMYXIN-DEXAMETH 3.5-10000-0.1 OP SUSP
OPHTHALMIC | Status: DC | PRN
  Administered 2021-04-07: 2 [drp] via OPHTHALMIC

## 2021-04-07 MED ORDER — TETRACAINE HCL 0.5 % OP SOLN
1.0000 [drp] | OPHTHALMIC | Status: AC | PRN
  Administered 2021-04-07 (×3): 1 [drp] via OPHTHALMIC

## 2021-04-07 MED ORDER — TROPICAMIDE 1 % OP SOLN
1.0000 [drp] | OPHTHALMIC | Status: AC | PRN
  Administered 2021-04-07 (×3): 1 [drp] via OPHTHALMIC
  Filled 2021-04-07: qty 2

## 2021-04-07 MED ORDER — STERILE WATER FOR IRRIGATION IR SOLN
Status: DC | PRN
  Administered 2021-04-07: 250 mL

## 2021-04-07 MED ORDER — POVIDONE-IODINE 5 % OP SOLN
OPHTHALMIC | Status: DC | PRN
  Administered 2021-04-07: 1 via OPHTHALMIC

## 2021-04-07 MED ORDER — SODIUM HYALURONATE 23MG/ML IO SOSY
PREFILLED_SYRINGE | INTRAOCULAR | Status: DC | PRN
  Administered 2021-04-07: 0.6 mL via INTRAOCULAR

## 2021-04-07 MED ORDER — SODIUM HYALURONATE 10 MG/ML IO SOLUTION
PREFILLED_SYRINGE | INTRAOCULAR | Status: DC | PRN
  Administered 2021-04-07: 0.85 mL via INTRAOCULAR

## 2021-04-07 SURGICAL SUPPLY — 11 items
CLOTH BEACON ORANGE TIMEOUT ST (SAFETY) ×1 IMPLANT
EYE SHIELD UNIVERSAL CLEAR (GAUZE/BANDAGES/DRESSINGS) ×1 IMPLANT
GLOVE SURG UNDER POLY LF SZ7 (GLOVE) ×2 IMPLANT
NDL HYPO 18GX1.5 BLUNT FILL (NEEDLE) IMPLANT
NEEDLE HYPO 18GX1.5 BLUNT FILL (NEEDLE) ×2 IMPLANT
PAD ARMBOARD 7.5X6 YLW CONV (MISCELLANEOUS) ×1 IMPLANT
RayOne EMV US (Intraocular Lens) ×1 IMPLANT
SYR TB 1ML LL NO SAFETY (SYRINGE) ×1 IMPLANT
TAPE SURG TRANSPORE 1 IN (GAUZE/BANDAGES/DRESSINGS) IMPLANT
TAPE SURGICAL TRANSPORE 1 IN (GAUZE/BANDAGES/DRESSINGS) ×2
WATER STERILE IRR 250ML POUR (IV SOLUTION) ×1 IMPLANT

## 2021-04-07 NOTE — Interval H&P Note (Signed)
History and Physical Interval Note:  04/07/2021 11:21 AM  [name]  has presented today for surgery, with the diagnosis of nuclear cataract left eye.  The various methods of treatment have been discussed with the patient and family. After consideration of risks, benefits and other options for treatment, the patient has consented to  Procedure(s) with comments: CATARACT EXTRACTION PHACO AND INTRAOCULAR LENS PLACEMENT LEFT EYE (Left) - left as a surgical intervention.  The patient's history has been reviewed, patient examined, no change in status, stable for surgery.  I have reviewed the patient's chart and labs.  Questions were answered to the patient's satisfaction.     Baruch Goldmann

## 2021-04-07 NOTE — Discharge Instructions (Signed)
Please discharge patient when stable, will follow up today with Dr. Javana Schey at the [facility] [place] office immediately following discharge.  Leave shield in place until visit.  All paperwork with discharge instructions will be given at the office.  [facility] Address:  [address]  , [place] [address]  

## 2021-04-07 NOTE — Transfer of Care (Signed)
Immediate Anesthesia Transfer of Care Note  Patient: [name]  Procedure(s) Performed: CATARACT EXTRACTION PHACO AND INTRAOCULAR LENS PLACEMENT LEFT EYE (Left: Eye)  Patient Location: Short Stay  Anesthesia Type:MAC  Level of Consciousness: awake, alert  and oriented  Airway & Oxygen Therapy: Patient Spontanous Breathing  Post-op Assessment: Report given to RN and Post -op Vital signs reviewed and stable  Post vital signs: Reviewed and stable  Last Vitals:  Vitals Value Taken Time  BP    Temp    Pulse    Resp    SpO2      Last Pain:  Vitals:   04/07/21 1052  TempSrc: Oral  PainSc: 0-No pain      Patients Stated Pain Goal: 5 (68/08/81 1031)  Complications: No notable events documented.

## 2021-04-07 NOTE — Anesthesia Postprocedure Evaluation (Signed)
Anesthesia Post Note  Patient: [name]  Procedure(s) Performed: CATARACT EXTRACTION PHACO AND INTRAOCULAR LENS PLACEMENT LEFT EYE (Left: Eye)  Patient location during evaluation: Phase II Anesthesia Type: MAC Level of consciousness: awake Pain management: pain level controlled Vital Signs Assessment: post-procedure vital signs reviewed and stable Respiratory status: spontaneous breathing and respiratory function stable Cardiovascular status: blood pressure returned to baseline and stable Postop Assessment: no headache and no apparent nausea or vomiting Anesthetic complications: no Comments: Late entry   No notable events documented.   Last Vitals:  Vitals:   04/07/21 1052 04/07/21 1148  BP: (!) 151/61 100/77  Pulse: 60 62  Resp: 15   Temp: 36.7 C 36.5 C  SpO2: 100% 98%    Last Pain:  Vitals:   04/07/21 1148  TempSrc: Axillary  PainSc: [place]

## 2021-04-07 NOTE — Anesthesia Preprocedure Evaluation (Signed)
Anesthesia Evaluation  Patient identified by MRN, date of birth, ID band Patient awake    Reviewed: Allergy & Precautions, H&P , NPO status , Patient's Chart, lab work & pertinent test results, reviewed documented beta blocker date and time   Airway Mallampati: II  TM Distance: >3 FB Neck ROM: full    Dental no notable dental hx.    Pulmonary neg pulmonary ROS,    Pulmonary exam normal breath sounds clear to auscultation       Cardiovascular Exercise Tolerance: Good hypertension, + pacemaker  Rhythm:regular Rate:Normal     Neuro/Psych PSYCHIATRIC DISORDERS Depression CVA, Residual Symptoms    GI/Hepatic Neg liver ROS, GERD  Medicated,  Endo/Other  diabetesHypothyroidism   Renal/GU CRFRenal disease  negative genitourinary   Musculoskeletal   Abdominal   Peds  Hematology negative hematology ROS (+)   Anesthesia Other Findings   Reproductive/Obstetrics negative OB ROS                             Anesthesia Physical  Anesthesia Plan  ASA: 3  Anesthesia Plan: MAC   Post-op Pain Management:    Induction:   PONV Risk Score and Plan:   Airway Management Planned:   Additional Equipment:   Intra-op Plan:   Post-operative Plan:   Informed Consent: I have reviewed the patients History and Physical, chart, labs and discussed the procedure including the risks, benefits and alternatives for the proposed anesthesia with the patient or authorized representative who has indicated his/her understanding and acceptance.     Dental Advisory Given  Plan Discussed with: CRNA  Anesthesia Plan Comments:         Anesthesia Quick Evaluation  

## 2021-04-07 NOTE — Op Note (Signed)
Date of procedure: 04/07/21  Pre-operative diagnosis: Visually significant age-related combined cataract, Left Eye (H25.812)  Post-operative diagnosis: Visually significant age-related combined cataract, Left Eye (H25.812)  Procedure: Removal of cataract via phacoemulsification and insertion of intra-ocular lens Rayner [id number] +21.0D into the capsular bag of the Left Eye  Attending surgeon: Gerda Diss. Sterling Mondo, MD, MA  Anesthesia: MAC, Topical Akten  Complications: None  Estimated Blood Loss: <75m (minimal)  Specimens: None  Implants: As above  Indications:  Visually significant age-related cataract, Left Eye  Procedure:  The patient was seen and identified in the pre-operative area. The operative eye was identified and dilated.  The operative eye was marked.  Topical anesthesia was administered to the operative eye.     The patient was then to the operative suite and placed in the supine position.  A timeout was performed confirming the patient, procedure to be performed, and all other relevant information.   The patient's face was prepped and draped in the usual fashion for intra-ocular surgery.  A lid speculum was placed into the operative eye and the surgical microscope moved into place and focused.  An inferotemporal paracentesis was created using a 20 gauge paracentesis blade.  Shugarcaine was injected into the anterior chamber.  Viscoelastic was injected into the anterior chamber.  A temporal clear-corneal main wound incision was created using a 2.421mmicrokeratome.  A continuous curvilinear capsulorrhexis was initiated using an irrigating cystitome and completed using capsulorrhexis forceps.  Hydrodissection and hydrodeliniation were performed.  Viscoelastic was injected into the anterior chamber.  A phacoemulsification handpiece and a chopper as a second instrument were used to remove the nucleus and epinucleus. The irrigation/aspiration handpiece was used to remove any remaining  cortical material.   The capsular bag was reinflated with viscoelastic, checked, and found to be intact.  The intraocular lens was inserted into the capsular bag.  The irrigation/aspiration handpiece was used to remove any remaining viscoelastic.  The clear corneal wound and paracentesis wounds were then hydrated and checked with Weck-Cels to be watertight.  The lid-speculum was removed.  The drape was removed.  The patient's face was cleaned with a wet and dry 4x4.   Maxitrol was instilled in the eye. A clear shield was taped over the eye. The patient was taken to the post-operative care unit in good condition, having tolerated the procedure well.  Post-Op Instructions: [facility] and will receive all other orders and instructions.

## 2021-04-08 ENCOUNTER — Encounter (HOSPITAL_COMMUNITY): Payer: Self-pay | Admitting: Ophthalmology

## 2021-04-14 ENCOUNTER — Other Ambulatory Visit: Payer: Self-pay

## 2021-04-14 ENCOUNTER — Encounter (HOSPITAL_COMMUNITY)
Admission: RE | Admit: 2021-04-14 | Discharge: 2021-04-14 | Disposition: A | Discharge status: Home / Self Care | Payer: Medicare HMO | Source: Ambulatory Visit | Attending: Ophthalmology | Admitting: Ophthalmology

## 2021-04-14 DIAGNOSIS — H25811 Combined forms of age-related cataract, right eye: Secondary | ICD-10-CM | POA: Diagnosis not present

## 2021-04-15 NOTE — H&P (Signed)
Surgical History & Physical  Patient Name: [name] DOB: 03-Jul-1940  Surgery: Cataract extraction with intraocular lens implant phacoemulsification; Right Eye  Surgeon: Baruch Goldmann MD Surgery Date:  04-21-21 Pre-Op Date:  04-14-21  HPI: A 24 Yr. old female patient 1. The patient is returning after cataract post-op. The left eye is affected. Status post cataract post-op, which began 1 weeks ago: Since the last visit, the affected area is doing well. The patient's vision is improved. Patient is following medication instructions, using the post op drop TID OS. The patient denies any eye pain per say, but states that the eye socket hurts from a fall that happened prior to the surgery. The patient experiences no flashes, floater, shadow, curtain or veil. The patient also presents for pre op OD, and complaints of her vision still negatively affecting her quality of life. The patient states she still has difficulty with all daily activities, such as reading small print on labels, or instruction, or anything she has to fill out. The patient also has difficulty outside seeing street signs, and glare from the sun makes it worse. HPI Completed by Dr. Baruch Goldmann  Medical History: Cataracts Hypertensive Retinopathy, OU: Keratoconjunctivitis sicca, TF:[id number] Anxiety disorder, Depressive disorder, End-stage r... Diabetes Heart Problem High Blood Pressure Thyroid Problems  Review of Systems Negative Allergic/Immunologic Negative Cardiovascular Negative Constitutional Negative Ear, Nose, Mouth & Throat Negative Endocrine Negative Eyes Negative Gastrointestinal Negative Genitourinary Negative Hemotologic/Lymphatic Negative Integumentary Negative Musculoskeletal Negative Neurological Negative Psychiatry Negative Respiratory  Social   Never smoked   Medication Prednisolone-Moxifloxacin-Bromfenac, Mupirocin, Albuterol, Inhaler, Allopurinol, Atorvastatin, Citalopram, Flecainide,  Furosemide, Gabapentin, Glipizide, Hydrocodone, Levothyroxine Sodium, Pantoprazole, Warfarin,   Sx/Procedures Phaco c IOL OS, Cardiac pacemaker, Excision of melanoma, Cholecystectomy, Gallbladder, Shoulder,   Drug Allergies  Pencilin, Allopurinol, Tetanus, Florinal,   History & Physical: Heent: Cataract, Right Eye NECK: supple without bruits LUNGS: lungs clear to auscultation CV: regular rate and rhythm Abdomen: soft and non-tender  Impression & Plan: Assessment: 1.  CATARACT EXTRACTION STATUS; Left Eye (Z98.42) 2.  COMBINED FORMS AGE RELATED CATARACT; Right Eye (H25.811)  Plan: 1.  1 week after cataract surgery. Doing well with improved vision and normal eye pressure. Call with any problems or concerns. Continue Pred-Moxi-Brom 2x/day for 3 more weeks.  2.  Cataract accounts for the patient's decreased vision. This visual impairment is not correctable with a tolerable change in glasses or contact lenses. Cataract surgery with an implantation of a new lens should significantly improve the visual and functional status of the patient. Discussed all risks, benefits, alternatives, and potential complications. Discussed the procedures and recovery. Patient desires to have surgery. A-scan ordered and performed today for intra-ocular lens calculations. The surgery will be performed in order to improve vision for driving, reading, and for eye examinations. Recommend phacoemulsification with intra-ocular lens. Recommend Dextenza for post-operative pain and inflammation. Right Eye. Surgery required to correct imbalance of vision. Dilates well - shugarcaine by protocol.

## 2021-04-21 ENCOUNTER — Ambulatory Visit (HOSPITAL_COMMUNITY): Payer: Medicare HMO | Admitting: Certified Registered"

## 2021-04-21 ENCOUNTER — Encounter (HOSPITAL_COMMUNITY)
Admission: RE | Disposition: A | Discharge status: Home / Self Care | Payer: Self-pay | Source: Home / Self Care | Attending: Ophthalmology

## 2021-04-21 ENCOUNTER — Encounter (HOSPITAL_COMMUNITY): Payer: Self-pay | Admitting: Ophthalmology

## 2021-04-21 ENCOUNTER — Ambulatory Visit (HOSPITAL_COMMUNITY)
Admission: RE | Admit: 2021-04-21 | Discharge: 2021-04-21 | Disposition: A | Discharge status: Home / Self Care | Payer: Medicare HMO | Attending: Ophthalmology | Admitting: Ophthalmology

## 2021-04-21 ENCOUNTER — Other Ambulatory Visit: Payer: Self-pay

## 2021-04-21 DIAGNOSIS — K219 Gastro-esophageal reflux disease without esophagitis: Secondary | ICD-10-CM | POA: Insufficient documentation

## 2021-04-21 DIAGNOSIS — E039 Hypothyroidism, unspecified: Secondary | ICD-10-CM | POA: Diagnosis not present

## 2021-04-21 DIAGNOSIS — E1136 Type 2 diabetes mellitus with diabetic cataract: Secondary | ICD-10-CM | POA: Insufficient documentation

## 2021-04-21 DIAGNOSIS — Z8673 Personal history of transient ischemic attack (TIA), and cerebral infarction without residual deficits: Secondary | ICD-10-CM | POA: Insufficient documentation

## 2021-04-21 DIAGNOSIS — H25811 Combined forms of age-related cataract, right eye: Secondary | ICD-10-CM | POA: Insufficient documentation

## 2021-04-21 DIAGNOSIS — R69 Illness, unspecified: Secondary | ICD-10-CM | POA: Diagnosis not present

## 2021-04-21 DIAGNOSIS — F32A Depression, unspecified: Secondary | ICD-10-CM | POA: Diagnosis not present

## 2021-04-21 DIAGNOSIS — Z950 Presence of cardiac pacemaker: Secondary | ICD-10-CM | POA: Insufficient documentation

## 2021-04-21 DIAGNOSIS — I10 Essential (primary) hypertension: Secondary | ICD-10-CM | POA: Diagnosis not present

## 2021-04-21 HISTORY — PX: CATARACT EXTRACTION W/PHACO: SHX586

## 2021-04-21 LAB — GLUCOSE, CAPILLARY: Glucose-Capillary: 87 mg/dL (ref 70–99)

## 2021-04-21 SURGERY — PHACOEMULSIFICATION, CATARACT, WITH IOL INSERTION
Anesthesia: Monitor Anesthesia Care | Site: Eye | Laterality: Right

## 2021-04-21 MED ORDER — NEOMYCIN-POLYMYXIN-DEXAMETH 3.5-10000-0.1 OP SUSP
OPHTHALMIC | Status: DC | PRN
  Administered 2021-04-21: 2 [drp] via OPHTHALMIC

## 2021-04-21 MED ORDER — SODIUM HYALURONATE 10 MG/ML IO SOLUTION
PREFILLED_SYRINGE | INTRAOCULAR | Status: DC | PRN
  Administered 2021-04-21: 0.85 mL via INTRAOCULAR

## 2021-04-21 MED ORDER — EPINEPHRINE PF 1 MG/ML IJ SOLN
INTRAOCULAR | Status: DC | PRN
  Administered 2021-04-21: 08:00:00 500 mL

## 2021-04-21 MED ORDER — POVIDONE-IODINE 5 % OP SOLN
OPHTHALMIC | Status: DC | PRN
  Administered 2021-04-21: 1 via OPHTHALMIC

## 2021-04-21 MED ORDER — STERILE WATER FOR IRRIGATION IR SOLN
Status: DC | PRN
  Administered 2021-04-21: 250 mL

## 2021-04-21 MED ORDER — BSS IO SOLN
INTRAOCULAR | Status: DC | PRN
  Administered 2021-04-21: 15 mL via INTRAOCULAR

## 2021-04-21 MED ORDER — PHENYLEPHRINE HCL 2.5 % OP SOLN
1.0000 [drp] | OPHTHALMIC | Status: AC | PRN
  Administered 2021-04-21 (×3): 1 [drp] via OPHTHALMIC

## 2021-04-21 MED ORDER — EPINEPHRINE PF 1 MG/ML IJ SOLN
INTRAMUSCULAR | Status: AC
  Filled 2021-04-21: qty 2

## 2021-04-21 MED ORDER — LIDOCAINE HCL 3.5 % OP GEL: 1.0000 "application " | Freq: Once | OPHTHALMIC | Status: DC

## 2021-04-21 MED ORDER — SODIUM HYALURONATE 23MG/ML IO SOSY
PREFILLED_SYRINGE | INTRAOCULAR | Status: DC | PRN
  Administered 2021-04-21: 0.6 mL via INTRAOCULAR

## 2021-04-21 MED ORDER — LIDOCAINE HCL (PF) 1 % IJ SOLN
INTRAOCULAR | Status: DC | PRN
  Administered 2021-04-21: 08:00:00 1 mL via OPHTHALMIC

## 2021-04-21 MED ORDER — TETRACAINE HCL 0.5 % OP SOLN
1.0000 [drp] | OPHTHALMIC | Status: AC | PRN
  Administered 2021-04-21 (×3): 1 [drp] via OPHTHALMIC

## 2021-04-21 MED ORDER — TROPICAMIDE 1 % OP SOLN
1.0000 [drp] | OPHTHALMIC | Status: AC | PRN
  Administered 2021-04-21 (×3): 1 [drp] via OPHTHALMIC
  Filled 2021-04-21: qty 2

## 2021-04-21 SURGICAL SUPPLY — 12 items
CLOTH BEACON ORANGE TIMEOUT ST (SAFETY) ×1 IMPLANT
EYE SHIELD UNIVERSAL CLEAR (GAUZE/BANDAGES/DRESSINGS) ×1 IMPLANT
GLOVE SURG UNDER POLY LF SZ6.5 (GLOVE) ×1 IMPLANT
GLOVE SURG UNDER POLY LF SZ7 (GLOVE) ×1 IMPLANT
NDL HYPO 18GX1.5 BLUNT FILL (NEEDLE) IMPLANT
NEEDLE HYPO 18GX1.5 BLUNT FILL (NEEDLE) ×2 IMPLANT
PAD ARMBOARD 7.5X6 YLW CONV (MISCELLANEOUS) ×1 IMPLANT
RAYONE EMV US (Intraocular Lens) ×1 IMPLANT
SYR TB 1ML LL NO SAFETY (SYRINGE) ×1 IMPLANT
TAPE SURG TRANSPORE 1 IN (GAUZE/BANDAGES/DRESSINGS) IMPLANT
TAPE SURGICAL TRANSPORE 1 IN (GAUZE/BANDAGES/DRESSINGS) ×2
WATER STERILE IRR 250ML POUR (IV SOLUTION) ×1 IMPLANT

## 2021-04-21 NOTE — Op Note (Signed)
Date of procedure: 04/21/21  Pre-operative diagnosis:  Visually significant combined form age-related cataract, Right Eye (H25.811)  Post-operative diagnosis:  Visually significant combined form age-related cataract, Right Eye (H25.811)  Procedure: Removal of cataract via phacoemulsification and insertion of intra-ocular lens Rayner [id number] +21.0D into the capsular bag of the Right Eye  Attending surgeon: Gerda Diss. Shirla Hodgkiss, MD, MA  Anesthesia: MAC, Topical Akten  Complications: None  Estimated Blood Loss: <94m (minimal)  Specimens: None  Implants: As above  Indications:  Visually significant age-related cataract, Right Eye  Procedure:  The patient was seen and identified in the pre-operative area. The operative eye was identified and dilated.  The operative eye was marked.  Topical anesthesia was administered to the operative eye.     The patient was then to the operative suite and placed in the supine position.  A timeout was performed confirming the patient, procedure to be performed, and all other relevant information.   The patient's face was prepped and draped in the usual fashion for intra-ocular surgery.  A lid speculum was placed into the operative eye and the surgical microscope moved into place and focused.  A superotemporal paracentesis was created using a 20 gauge paracentesis blade.  Shugarcaine was injected into the anterior chamber.  Viscoelastic was injected into the anterior chamber.  A temporal clear-corneal main wound incision was created using a 2.420mmicrokeratome.  A continuous curvilinear capsulorrhexis was initiated using an irrigating cystitome and completed using capsulorrhexis forceps.  Hydrodissection and hydrodeliniation were performed.  Viscoelastic was injected into the anterior chamber.  A phacoemulsification handpiece and a chopper as a second instrument were used to remove the nucleus and epinucleus. The irrigation/aspiration handpiece was used to remove any  remaining cortical material.   The capsular bag was reinflated with viscoelastic, checked, and found to be intact.  The intraocular lens was inserted into the capsular bag.  The irrigation/aspiration handpiece was used to remove any remaining viscoelastic.  The clear corneal wound and paracentesis wounds were then hydrated and checked with Weck-Cels to be watertight.  The lid-speculum was removed.  The drape was removed.  The patient's face was cleaned with a wet and dry 4x4.   Maxitrol was instilled in the eye. A clear shield was taped over the eye. The patient was taken to the post-operative care unit in good condition, having tolerated the procedure well.  Post-Op Instructions: [facility] and will receive all other orders and instructions.

## 2021-04-21 NOTE — Anesthesia Preprocedure Evaluation (Signed)
Anesthesia Evaluation  Patient identified by MRN, date of birth, ID band Patient awake    Reviewed: Allergy & Precautions, H&P , NPO status , Patient's Chart, lab work & pertinent test results, reviewed documented beta blocker date and time   Airway Mallampati: II  TM Distance: >3 FB Neck ROM: full    Dental no notable dental hx.    Pulmonary neg pulmonary ROS,    Pulmonary exam normal breath sounds clear to auscultation       Cardiovascular Exercise Tolerance: Good hypertension, + pacemaker  Rhythm:regular Rate:Normal     Neuro/Psych PSYCHIATRIC DISORDERS Depression CVA, Residual Symptoms    GI/Hepatic Neg liver ROS, GERD  Medicated,  Endo/Other  diabetesHypothyroidism   Renal/GU CRFRenal disease  negative genitourinary   Musculoskeletal   Abdominal   Peds  Hematology negative hematology ROS (+)   Anesthesia Other Findings   Reproductive/Obstetrics negative OB ROS                             Anesthesia Physical  Anesthesia Plan  ASA: 3  Anesthesia Plan: MAC   Post-op Pain Management:    Induction:   PONV Risk Score and Plan:   Airway Management Planned:   Additional Equipment:   Intra-op Plan:   Post-operative Plan:   Informed Consent: I have reviewed the patients History and Physical, chart, labs and discussed the procedure including the risks, benefits and alternatives for the proposed anesthesia with the patient or authorized representative who has indicated his/her understanding and acceptance.     Dental Advisory Given  Plan Discussed with: CRNA  Anesthesia Plan Comments:         Anesthesia Quick Evaluation

## 2021-04-21 NOTE — Anesthesia Postprocedure Evaluation (Signed)
Anesthesia Post Note  Patient: DAJA SHUPING  Procedure(s) Performed: CATARACT EXTRACTION PHACO AND INTRAOCULAR LENS PLACEMENT RIGHT EYE (Right: Eye)  Patient location during evaluation: Phase II Anesthesia Type: MAC Level of consciousness: awake Pain management: pain level controlled Vital Signs Assessment: post-procedure vital signs reviewed and stable Respiratory status: spontaneous breathing and respiratory function stable Cardiovascular status: blood pressure returned to baseline and stable Postop Assessment: no headache and no apparent nausea or vomiting Anesthetic complications: no Comments: Late entry   No notable events documented.   Last Vitals:  Vitals:   04/21/21 0702 04/21/21 0818  BP: (!) 163/55 (!) 148/62  Pulse: 63 65  Resp: 17 16  Temp: 36.6 C 36.7 C  SpO2: 99% 100%    Last Pain:  Vitals:   04/21/21 0818  TempSrc: Oral  PainSc: 0-No pain                 Louann Sjogren

## 2021-04-21 NOTE — Discharge Instructions (Signed)
Please discharge patient when stable, will follow up today with Dr. Ravinder Hofland at the [facility] [place] office immediately following discharge.  Leave shield in place until visit.  All paperwork with discharge instructions will be given at the office.  [facility] Address:  [address]  , [place] [address]  

## 2021-04-21 NOTE — Interval H&P Note (Signed)
History and Physical Interval Note:  04/21/2021 7:56 AM  [name]  has presented today for surgery, with the diagnosis of nuclear cataract right eye.  The various methods of treatment have been discussed with the patient and family. After consideration of risks, benefits and other options for treatment, the patient has consented to  Procedure(s) with comments: CATARACT EXTRACTION PHACO AND INTRAOCULAR LENS PLACEMENT RIGHT EYE (Right) - right as a surgical intervention.  The patient's history has been reviewed, patient examined, no change in status, stable for surgery.  I have reviewed the patient's chart and labs.  Questions were answered to the patient's satisfaction.     Baruch Goldmann

## 2021-04-21 NOTE — Transfer of Care (Signed)
Immediate Anesthesia Transfer of Care Note  Patient: [name]  Procedure(s) Performed: CATARACT EXTRACTION PHACO AND INTRAOCULAR LENS PLACEMENT RIGHT EYE (Right: Eye)  Patient Location: Short Stay  Anesthesia Type:MAC  Level of Consciousness: awake, alert  and oriented  Airway & Oxygen Therapy: Patient Spontanous Breathing  Post-op Assessment: Report given to RN and Post -op Vital signs reviewed and stable  Post vital signs: Reviewed and stable  Last Vitals:  Vitals Value Taken Time  BP    Temp    Pulse    Resp    SpO2      Last Pain:  Vitals:   04/21/21 0702  TempSrc: Oral  PainSc: 5          Complications: No notable events documented.

## 2021-04-21 NOTE — Anesthesia Procedure Notes (Signed)
Procedure Name: MAC Date/Time: 04/21/2021 8:02 AM Performed by: Orlie Dakin, CRNA Pre-anesthesia Checklist: Patient identified, Emergency Drugs available, Suction available and Patient being monitored Patient Re-evaluated:Patient Re-evaluated prior to induction Oxygen Delivery Method: Nasal cannula Placement Confirmation: positive ETCO2

## 2021-04-22 ENCOUNTER — Encounter (HOSPITAL_COMMUNITY): Payer: Self-pay | Admitting: Ophthalmology

## 2021-04-23 DIAGNOSIS — M1990 Unspecified osteoarthritis, unspecified site: Secondary | ICD-10-CM | POA: Diagnosis not present

## 2021-04-23 DIAGNOSIS — E1165 Type 2 diabetes mellitus with hyperglycemia: Secondary | ICD-10-CM | POA: Diagnosis not present

## 2021-04-24 ENCOUNTER — Other Ambulatory Visit (HOSPITAL_COMMUNITY): Payer: Self-pay | Admitting: Internal Medicine

## 2021-04-24 DIAGNOSIS — Z1231 Encounter for screening mammogram for malignant neoplasm of breast: Secondary | ICD-10-CM

## 2021-05-02 DIAGNOSIS — I4821 Permanent atrial fibrillation: Secondary | ICD-10-CM | POA: Diagnosis not present

## 2021-05-14 ENCOUNTER — Ambulatory Visit (HOSPITAL_COMMUNITY): Payer: Medicare HMO

## 2021-05-23 DIAGNOSIS — E1165 Type 2 diabetes mellitus with hyperglycemia: Secondary | ICD-10-CM | POA: Diagnosis not present

## 2021-05-23 DIAGNOSIS — M1990 Unspecified osteoarthritis, unspecified site: Secondary | ICD-10-CM | POA: Diagnosis not present

## 2021-05-30 DIAGNOSIS — E1122 Type 2 diabetes mellitus with diabetic chronic kidney disease: Secondary | ICD-10-CM | POA: Diagnosis not present

## 2021-06-03 DIAGNOSIS — I4821 Permanent atrial fibrillation: Secondary | ICD-10-CM | POA: Diagnosis not present

## 2021-06-10 DIAGNOSIS — E782 Mixed hyperlipidemia: Secondary | ICD-10-CM | POA: Diagnosis not present

## 2021-06-10 DIAGNOSIS — E039 Hypothyroidism, unspecified: Secondary | ICD-10-CM | POA: Diagnosis not present

## 2021-06-10 DIAGNOSIS — E1122 Type 2 diabetes mellitus with diabetic chronic kidney disease: Secondary | ICD-10-CM | POA: Diagnosis not present

## 2021-06-12 DIAGNOSIS — N1831 Chronic kidney disease, stage 3a: Secondary | ICD-10-CM | POA: Diagnosis not present

## 2021-06-12 DIAGNOSIS — N2581 Secondary hyperparathyroidism of renal origin: Secondary | ICD-10-CM | POA: Diagnosis not present

## 2021-06-12 DIAGNOSIS — I10 Essential (primary) hypertension: Secondary | ICD-10-CM | POA: Diagnosis not present

## 2021-06-12 DIAGNOSIS — M542 Cervicalgia: Secondary | ICD-10-CM | POA: Insufficient documentation

## 2021-06-12 DIAGNOSIS — E1122 Type 2 diabetes mellitus with diabetic chronic kidney disease: Secondary | ICD-10-CM | POA: Diagnosis not present

## 2021-06-12 DIAGNOSIS — E039 Hypothyroidism, unspecified: Secondary | ICD-10-CM | POA: Diagnosis not present

## 2021-06-12 DIAGNOSIS — I4891 Unspecified atrial fibrillation: Secondary | ICD-10-CM | POA: Diagnosis not present

## 2021-06-12 DIAGNOSIS — Z0001 Encounter for general adult medical examination with abnormal findings: Secondary | ICD-10-CM | POA: Diagnosis not present

## 2021-06-12 DIAGNOSIS — E782 Mixed hyperlipidemia: Secondary | ICD-10-CM | POA: Diagnosis not present

## 2021-06-12 DIAGNOSIS — E876 Hypokalemia: Secondary | ICD-10-CM | POA: Diagnosis not present

## 2021-06-17 ENCOUNTER — Ambulatory Visit (INDEPENDENT_AMBULATORY_CARE_PROVIDER_SITE_OTHER): Payer: Medicare HMO

## 2021-06-17 DIAGNOSIS — I495 Sick sinus syndrome: Secondary | ICD-10-CM

## 2021-06-17 LAB — CUP PACEART REMOTE DEVICE CHECK
Battery Remaining Longevity: 8 mo
Battery Remaining Percentage: 7 %
Battery Voltage: 2.74 V
Brady Statistic AP VP Percent: 36 %
Brady Statistic AP VS Percent: 55 %
Brady Statistic AS VP Percent: 1 %
Brady Statistic AS VS Percent: 8.9 %
Brady Statistic RA Percent Paced: 91 %
Brady Statistic RV Percent Paced: 36 %
Date Time Interrogation Session: 20230124035402
Implantable Lead Implant Date: 20130913
Implantable Lead Implant Date: 20130913
Implantable Lead Location: 753859
Implantable Lead Location: 753860
Implantable Lead Model: 1948
Implantable Pulse Generator Implant Date: 20130913
Lead Channel Impedance Value: 1425 Ohm
Lead Channel Impedance Value: 510 Ohm
Lead Channel Pacing Threshold Amplitude: 0.75 V
Lead Channel Pacing Threshold Amplitude: 2 V
Lead Channel Pacing Threshold Pulse Width: 0.5 ms
Lead Channel Pacing Threshold Pulse Width: 0.8 ms
Lead Channel Sensing Intrinsic Amplitude: 12 mV
Lead Channel Sensing Intrinsic Amplitude: 2 mV
Lead Channel Setting Pacing Amplitude: 2 V
Lead Channel Setting Pacing Amplitude: 2.75 V
Lead Channel Setting Pacing Pulse Width: 0.8 ms
Lead Channel Setting Sensing Sensitivity: 2 mV
Pulse Gen Model: 2210
Pulse Gen Serial Number: 7382865

## 2021-06-24 DIAGNOSIS — M1990 Unspecified osteoarthritis, unspecified site: Secondary | ICD-10-CM | POA: Diagnosis not present

## 2021-06-24 DIAGNOSIS — E1165 Type 2 diabetes mellitus with hyperglycemia: Secondary | ICD-10-CM | POA: Diagnosis not present

## 2021-06-27 NOTE — Progress Notes (Signed)
Remote pacemaker transmission.   

## 2021-07-14 ENCOUNTER — Other Ambulatory Visit: Payer: Self-pay | Admitting: Internal Medicine

## 2021-07-14 DIAGNOSIS — Z1231 Encounter for screening mammogram for malignant neoplasm of breast: Secondary | ICD-10-CM

## 2021-07-14 DIAGNOSIS — I4821 Permanent atrial fibrillation: Secondary | ICD-10-CM | POA: Diagnosis not present

## 2021-07-17 ENCOUNTER — Ambulatory Visit
Admission: RE | Admit: 2021-07-17 | Discharge: 2021-07-17 | Disposition: A | Discharge status: Home / Self Care | Payer: Medicare HMO | Source: Ambulatory Visit | Attending: Internal Medicine | Admitting: Internal Medicine

## 2021-07-17 DIAGNOSIS — Z1231 Encounter for screening mammogram for malignant neoplasm of breast: Secondary | ICD-10-CM

## 2021-07-18 ENCOUNTER — Ambulatory Visit (INDEPENDENT_AMBULATORY_CARE_PROVIDER_SITE_OTHER): Payer: Medicare HMO

## 2021-07-18 DIAGNOSIS — I495 Sick sinus syndrome: Secondary | ICD-10-CM

## 2021-07-18 LAB — CUP PACEART REMOTE DEVICE CHECK
Battery Remaining Longevity: 7 mo
Battery Remaining Percentage: 6 %
Battery Voltage: 2.74 V
Brady Statistic AP VP Percent: 38 %
Brady Statistic AP VS Percent: 53 %
Brady Statistic AS VP Percent: 1 %
Brady Statistic AS VS Percent: 8.4 %
Brady Statistic RA Percent Paced: 91 %
Brady Statistic RV Percent Paced: 38 %
Date Time Interrogation Session: 20230224020517
Implantable Lead Implant Date: 20130913
Implantable Lead Implant Date: 20130913
Implantable Lead Location: 753859
Implantable Lead Location: 753860
Implantable Lead Model: 1948
Implantable Pulse Generator Implant Date: 20130913
Lead Channel Impedance Value: 1375 Ohm
Lead Channel Impedance Value: 460 Ohm
Lead Channel Pacing Threshold Amplitude: 0.75 V
Lead Channel Pacing Threshold Amplitude: 2 V
Lead Channel Pacing Threshold Pulse Width: 0.5 ms
Lead Channel Pacing Threshold Pulse Width: 0.8 ms
Lead Channel Sensing Intrinsic Amplitude: 1.4 mV
Lead Channel Sensing Intrinsic Amplitude: 12 mV
Lead Channel Setting Pacing Amplitude: 2 V
Lead Channel Setting Pacing Amplitude: 2.75 V
Lead Channel Setting Pacing Pulse Width: 0.8 ms
Lead Channel Setting Sensing Sensitivity: 2 mV
Pulse Gen Model: 2210
Pulse Gen Serial Number: 7382865

## 2021-07-22 DIAGNOSIS — E1165 Type 2 diabetes mellitus with hyperglycemia: Secondary | ICD-10-CM | POA: Diagnosis not present

## 2021-07-22 DIAGNOSIS — M1990 Unspecified osteoarthritis, unspecified site: Secondary | ICD-10-CM | POA: Diagnosis not present

## 2021-07-22 NOTE — Progress Notes (Signed)
Remote pacemaker transmission.   

## 2021-08-15 DIAGNOSIS — I4821 Permanent atrial fibrillation: Secondary | ICD-10-CM | POA: Diagnosis not present

## 2021-08-20 ENCOUNTER — Ambulatory Visit (INDEPENDENT_AMBULATORY_CARE_PROVIDER_SITE_OTHER): Payer: Medicare HMO

## 2021-08-20 DIAGNOSIS — I495 Sick sinus syndrome: Secondary | ICD-10-CM

## 2021-08-20 LAB — CUP PACEART REMOTE DEVICE CHECK
Battery Remaining Longevity: 6 mo
Battery Remaining Percentage: 5 %
Battery Voltage: 2.72 V
Brady Statistic AP VP Percent: 39 %
Brady Statistic AP VS Percent: 53 %
Brady Statistic AS VP Percent: 1 %
Brady Statistic AS VS Percent: 8 %
Brady Statistic RA Percent Paced: 92 %
Brady Statistic RV Percent Paced: 39 %
Date Time Interrogation Session: 20230329031717
Implantable Lead Implant Date: 20130913
Implantable Lead Implant Date: 20130913
Implantable Lead Location: 753859
Implantable Lead Location: 753860
Implantable Lead Model: 1948
Implantable Pulse Generator Implant Date: 20130913
Lead Channel Impedance Value: 1500 Ohm
Lead Channel Impedance Value: 490 Ohm
Lead Channel Pacing Threshold Amplitude: 0.75 V
Lead Channel Pacing Threshold Amplitude: 2 V
Lead Channel Pacing Threshold Pulse Width: 0.5 ms
Lead Channel Pacing Threshold Pulse Width: 0.8 ms
Lead Channel Sensing Intrinsic Amplitude: 1.4 mV
Lead Channel Sensing Intrinsic Amplitude: 12 mV
Lead Channel Setting Pacing Amplitude: 2 V
Lead Channel Setting Pacing Amplitude: 2.75 V
Lead Channel Setting Pacing Pulse Width: 0.8 ms
Lead Channel Setting Sensing Sensitivity: 2 mV
Pulse Gen Model: 2210
Pulse Gen Serial Number: 7382865

## 2021-08-22 DIAGNOSIS — E1165 Type 2 diabetes mellitus with hyperglycemia: Secondary | ICD-10-CM | POA: Diagnosis not present

## 2021-08-22 DIAGNOSIS — M1990 Unspecified osteoarthritis, unspecified site: Secondary | ICD-10-CM | POA: Diagnosis not present

## 2021-08-26 DIAGNOSIS — R1111 Vomiting without nausea: Secondary | ICD-10-CM | POA: Diagnosis not present

## 2021-08-26 DIAGNOSIS — R112 Nausea with vomiting, unspecified: Secondary | ICD-10-CM | POA: Diagnosis not present

## 2021-09-02 NOTE — Progress Notes (Signed)
Remote pacemaker transmission.   

## 2021-09-11 DIAGNOSIS — I4821 Permanent atrial fibrillation: Secondary | ICD-10-CM | POA: Diagnosis not present

## 2021-09-16 ENCOUNTER — Ambulatory Visit (INDEPENDENT_AMBULATORY_CARE_PROVIDER_SITE_OTHER): Payer: Medicare HMO

## 2021-09-16 DIAGNOSIS — I495 Sick sinus syndrome: Secondary | ICD-10-CM | POA: Diagnosis not present

## 2021-09-17 LAB — CUP PACEART REMOTE DEVICE CHECK
Battery Remaining Longevity: 5 mo
Battery Remaining Percentage: 4 %
Battery Voltage: 2.71 V
Brady Statistic AP VP Percent: 41 %
Brady Statistic AP VS Percent: 51 %
Brady Statistic AS VP Percent: 1 %
Brady Statistic AS VS Percent: 7.8 %
Brady Statistic RA Percent Paced: 92 %
Brady Statistic RV Percent Paced: 41 %
Date Time Interrogation Session: 20230426092355
Implantable Lead Implant Date: 20130913
Implantable Lead Implant Date: 20130913
Implantable Lead Location: 753859
Implantable Lead Location: 753860
Implantable Lead Model: 1948
Implantable Pulse Generator Implant Date: 20130913
Lead Channel Impedance Value: 1375 Ohm
Lead Channel Impedance Value: 480 Ohm
Lead Channel Pacing Threshold Amplitude: 0.75 V
Lead Channel Pacing Threshold Amplitude: 2 V
Lead Channel Pacing Threshold Pulse Width: 0.5 ms
Lead Channel Pacing Threshold Pulse Width: 0.8 ms
Lead Channel Sensing Intrinsic Amplitude: 1 mV
Lead Channel Sensing Intrinsic Amplitude: 12 mV
Lead Channel Setting Pacing Amplitude: 2 V
Lead Channel Setting Pacing Amplitude: 2.75 V
Lead Channel Setting Pacing Pulse Width: 0.8 ms
Lead Channel Setting Sensing Sensitivity: 2 mV
Pulse Gen Model: 2210
Pulse Gen Serial Number: 7382865

## 2021-09-18 DIAGNOSIS — E1122 Type 2 diabetes mellitus with diabetic chronic kidney disease: Secondary | ICD-10-CM | POA: Diagnosis not present

## 2021-09-18 DIAGNOSIS — E782 Mixed hyperlipidemia: Secondary | ICD-10-CM | POA: Diagnosis not present

## 2021-09-18 DIAGNOSIS — N2581 Secondary hyperparathyroidism of renal origin: Secondary | ICD-10-CM | POA: Diagnosis not present

## 2021-09-19 ENCOUNTER — Inpatient Hospital Stay (HOSPITAL_COMMUNITY)
Admission: EM | Admit: 2021-09-19 | Discharge: 2021-09-23 | DRG: 482 | Disposition: A | Discharge status: Skilled Nursing Facility | Payer: Medicare HMO | Attending: Family Medicine | Admitting: Family Medicine

## 2021-09-19 ENCOUNTER — Other Ambulatory Visit: Payer: Self-pay

## 2021-09-19 ENCOUNTER — Emergency Department (HOSPITAL_COMMUNITY): Payer: Medicare HMO

## 2021-09-19 ENCOUNTER — Encounter (HOSPITAL_COMMUNITY): Payer: Self-pay

## 2021-09-19 DIAGNOSIS — N1831 Chronic kidney disease, stage 3a: Secondary | ICD-10-CM | POA: Diagnosis not present

## 2021-09-19 DIAGNOSIS — K219 Gastro-esophageal reflux disease without esophagitis: Secondary | ICD-10-CM | POA: Diagnosis not present

## 2021-09-19 DIAGNOSIS — S72001A Fracture of unspecified part of neck of right femur, initial encounter for closed fracture: Secondary | ICD-10-CM | POA: Diagnosis not present

## 2021-09-19 DIAGNOSIS — E782 Mixed hyperlipidemia: Secondary | ICD-10-CM

## 2021-09-19 DIAGNOSIS — Z887 Allergy status to serum and vaccine status: Secondary | ICD-10-CM

## 2021-09-19 DIAGNOSIS — M479 Spondylosis, unspecified: Secondary | ICD-10-CM | POA: Diagnosis present

## 2021-09-19 DIAGNOSIS — I4891 Unspecified atrial fibrillation: Secondary | ICD-10-CM | POA: Diagnosis not present

## 2021-09-19 DIAGNOSIS — I480 Paroxysmal atrial fibrillation: Secondary | ICD-10-CM | POA: Diagnosis present

## 2021-09-19 DIAGNOSIS — Z809 Family history of malignant neoplasm, unspecified: Secondary | ICD-10-CM | POA: Diagnosis not present

## 2021-09-19 DIAGNOSIS — Z79890 Hormone replacement therapy (postmenopausal): Secondary | ICD-10-CM | POA: Diagnosis not present

## 2021-09-19 DIAGNOSIS — E1122 Type 2 diabetes mellitus with diabetic chronic kidney disease: Secondary | ICD-10-CM | POA: Diagnosis present

## 2021-09-19 DIAGNOSIS — R52 Pain, unspecified: Secondary | ICD-10-CM | POA: Diagnosis not present

## 2021-09-19 DIAGNOSIS — N1832 Chronic kidney disease, stage 3b: Secondary | ICD-10-CM | POA: Diagnosis not present

## 2021-09-19 DIAGNOSIS — Z96612 Presence of left artificial shoulder joint: Secondary | ICD-10-CM | POA: Diagnosis present

## 2021-09-19 DIAGNOSIS — Z7901 Long term (current) use of anticoagulants: Secondary | ICD-10-CM

## 2021-09-19 DIAGNOSIS — R079 Chest pain, unspecified: Secondary | ICD-10-CM | POA: Diagnosis not present

## 2021-09-19 DIAGNOSIS — I129 Hypertensive chronic kidney disease with stage 1 through stage 4 chronic kidney disease, or unspecified chronic kidney disease: Secondary | ICD-10-CM | POA: Diagnosis present

## 2021-09-19 DIAGNOSIS — S0003XA Contusion of scalp, initial encounter: Secondary | ICD-10-CM | POA: Diagnosis not present

## 2021-09-19 DIAGNOSIS — R4020 Unspecified coma: Secondary | ICD-10-CM | POA: Diagnosis not present

## 2021-09-19 DIAGNOSIS — Z79899 Other long term (current) drug therapy: Secondary | ICD-10-CM

## 2021-09-19 DIAGNOSIS — Z743 Need for continuous supervision: Secondary | ICD-10-CM | POA: Diagnosis not present

## 2021-09-19 DIAGNOSIS — M1611 Unilateral primary osteoarthritis, right hip: Secondary | ICD-10-CM | POA: Diagnosis not present

## 2021-09-19 DIAGNOSIS — E785 Hyperlipidemia, unspecified: Secondary | ICD-10-CM | POA: Diagnosis present

## 2021-09-19 DIAGNOSIS — Z950 Presence of cardiac pacemaker: Secondary | ICD-10-CM

## 2021-09-19 DIAGNOSIS — Z880 Allergy status to penicillin: Secondary | ICD-10-CM

## 2021-09-19 DIAGNOSIS — Z888 Allergy status to other drugs, medicaments and biological substances status: Secondary | ICD-10-CM

## 2021-09-19 DIAGNOSIS — E119 Type 2 diabetes mellitus without complications: Secondary | ICD-10-CM

## 2021-09-19 DIAGNOSIS — Z7984 Long term (current) use of oral hypoglycemic drugs: Secondary | ICD-10-CM

## 2021-09-19 DIAGNOSIS — E876 Hypokalemia: Secondary | ICD-10-CM | POA: Diagnosis not present

## 2021-09-19 DIAGNOSIS — T501X5A Adverse effect of loop [high-ceiling] diuretics, initial encounter: Secondary | ICD-10-CM | POA: Diagnosis not present

## 2021-09-19 DIAGNOSIS — Z8249 Family history of ischemic heart disease and other diseases of the circulatory system: Secondary | ICD-10-CM | POA: Diagnosis not present

## 2021-09-19 DIAGNOSIS — E039 Hypothyroidism, unspecified: Secondary | ICD-10-CM | POA: Diagnosis present

## 2021-09-19 DIAGNOSIS — Z8673 Personal history of transient ischemic attack (TIA), and cerebral infarction without residual deficits: Secondary | ICD-10-CM | POA: Diagnosis not present

## 2021-09-19 DIAGNOSIS — S0990XA Unspecified injury of head, initial encounter: Secondary | ICD-10-CM | POA: Diagnosis not present

## 2021-09-19 DIAGNOSIS — W010XXA Fall on same level from slipping, tripping and stumbling without subsequent striking against object, initial encounter: Secondary | ICD-10-CM | POA: Diagnosis present

## 2021-09-19 DIAGNOSIS — J189 Pneumonia, unspecified organism: Secondary | ICD-10-CM | POA: Diagnosis not present

## 2021-09-19 LAB — BASIC METABOLIC PANEL
Anion gap: 10 (ref 5–15)
BUN: 25 mg/dL — ABNORMAL HIGH (ref 8–23)
CO2: 32 mmol/L (ref 22–32)
Calcium: 9 mg/dL (ref 8.9–10.3)
Chloride: 98 mmol/L (ref 98–111)
Creatinine, Ser: 1.3 mg/dL — ABNORMAL HIGH (ref 0.44–1.00)
GFR, Estimated: 42 mL/min — ABNORMAL LOW (ref >60)
Glucose, Bld: 134 mg/dL — ABNORMAL HIGH (ref 70–99)
Potassium: 2.8 mmol/L — ABNORMAL LOW (ref 3.5–5.1)
Sodium: 140 mmol/L (ref 135–145)

## 2021-09-19 LAB — CBC WITH DIFFERENTIAL/PLATELET
Abs Immature Granulocytes: 0.02 10*3/uL (ref 0.00–0.07)
Basophils Absolute: 0 10*3/uL (ref 0.0–0.1)
Basophils Relative: 0 %
Eosinophils Absolute: 0.2 10*3/uL (ref 0.0–0.5)
Eosinophils Relative: 4 %
HCT: 36.5 % (ref 36.0–46.0)
Hemoglobin: 11.9 g/dL — ABNORMAL LOW (ref 12.0–15.0)
Immature Granulocytes: 0 %
Lymphocytes Relative: 40 %
Lymphs Abs: 2.3 10*3/uL (ref 0.7–4.0)
MCH: 32.1 pg (ref 26.0–34.0)
MCHC: 32.6 g/dL (ref 30.0–36.0)
MCV: 98.4 fL (ref 80.0–100.0)
Monocytes Absolute: 0.3 10*3/uL (ref 0.1–1.0)
Monocytes Relative: 6 %
Neutro Abs: 2.9 10*3/uL (ref 1.7–7.7)
Neutrophils Relative %: 50 %
Platelets: 131 10*3/uL — ABNORMAL LOW (ref 150–400)
RBC: 3.71 MIL/uL — ABNORMAL LOW (ref 3.87–5.11)
RDW: 14.4 % (ref 11.5–15.5)
WBC: 5.8 10*3/uL (ref 4.0–10.5)
nRBC: 0 % (ref 0.0–0.2)

## 2021-09-19 LAB — GLUCOSE, CAPILLARY: Glucose-Capillary: 88 mg/dL (ref 70–99)

## 2021-09-19 LAB — TYPE AND SCREEN
ABO/RH(D): O POS
Antibody Screen: NEGATIVE

## 2021-09-19 LAB — PROTIME-INR
INR: 2.5 — ABNORMAL HIGH (ref 0.8–1.2)
Prothrombin Time: 26.9 seconds — ABNORMAL HIGH (ref 11.4–15.2)

## 2021-09-19 MED ORDER — HYDROCODONE-ACETAMINOPHEN 7.5-325 MG PO TABS
1.0000 | ORAL_TABLET | Freq: Three times a day (TID) | ORAL | Status: DC | PRN
  Administered 2021-09-19: 1 via ORAL
  Filled 2021-09-19: qty 1

## 2021-09-19 MED ORDER — ONDANSETRON HCL 4 MG PO TABS: 4.0000 mg | ORAL_TABLET | Freq: Four times a day (QID) | ORAL | Status: DC | PRN

## 2021-09-19 MED ORDER — ATORVASTATIN CALCIUM 10 MG PO TABS
20.0000 mg | ORAL_TABLET | Freq: Every day | ORAL | Status: DC
  Administered 2021-09-21 – 2021-09-23 (×3): 20 mg via ORAL
  Filled 2021-09-19 (×3): qty 2

## 2021-09-19 MED ORDER — VITAMIN K1 10 MG/ML IJ SOLN
INTRAMUSCULAR | Status: AC
Start: 2021-09-19 — End: ?
  Filled 2021-09-19: qty 1

## 2021-09-19 MED ORDER — FENTANYL CITRATE PF 50 MCG/ML IJ SOSY
50.0000 ug | PREFILLED_SYRINGE | Freq: Once | INTRAMUSCULAR | Status: AC
  Administered 2021-09-19: 50 ug via INTRAVENOUS
  Filled 2021-09-19: qty 1

## 2021-09-19 MED ORDER — MORPHINE SULFATE (PF) 2 MG/ML IV SOLN
2.0000 mg | INTRAVENOUS | Status: DC | PRN
  Administered 2021-09-19 – 2021-09-20 (×2): 2 mg via INTRAVENOUS
  Filled 2021-09-19 (×2): qty 1

## 2021-09-19 MED ORDER — FUROSEMIDE 40 MG PO TABS
160.0000 mg | ORAL_TABLET | Freq: Two times a day (BID) | ORAL | Status: DC
  Administered 2021-09-20 – 2021-09-23 (×6): 160 mg via ORAL
  Filled 2021-09-19 (×6): qty 4

## 2021-09-19 MED ORDER — POLYETHYLENE GLYCOL 3350 17 G PO PACK: 17.0000 g | PACK | Freq: Every day | ORAL | Status: DC | PRN

## 2021-09-19 MED ORDER — GABAPENTIN 100 MG PO CAPS
100.0000 mg | ORAL_CAPSULE | Freq: Three times a day (TID) | ORAL | Status: DC
  Administered 2021-09-19 – 2021-09-23 (×10): 100 mg via ORAL
  Filled 2021-09-19 (×10): qty 1

## 2021-09-19 MED ORDER — ALBUTEROL SULFATE (2.5 MG/3ML) 0.083% IN NEBU: 2.5000 mg | INHALATION_SOLUTION | RESPIRATORY_TRACT | Status: DC | PRN

## 2021-09-19 MED ORDER — CALCITRIOL 0.25 MCG PO CAPS: 0.2500 ug | ORAL_CAPSULE | ORAL | Status: DC

## 2021-09-19 MED ORDER — CALCITRIOL 0.25 MCG PO CAPS
0.5000 ug | ORAL_CAPSULE | ORAL | Status: DC
  Filled 2021-09-19 (×2): qty 2

## 2021-09-19 MED ORDER — VITAMIN B-12 1000 MCG PO TABS
5000.0000 ug | ORAL_TABLET | Freq: Every day | ORAL | Status: DC
  Administered 2021-09-21 – 2021-09-23 (×3): 5000 ug via ORAL
  Filled 2021-09-19 (×3): qty 5

## 2021-09-19 MED ORDER — POTASSIUM CHLORIDE 20 MEQ PO PACK
40.0000 meq | PACK | Freq: Once | ORAL | Status: AC
  Administered 2021-09-19: 40 meq via ORAL
  Filled 2021-09-19: qty 2

## 2021-09-19 MED ORDER — VITAMIN K1 10 MG/ML IJ SOLN
5.0000 mg | Freq: Once | INTRAVENOUS | Status: AC
  Administered 2021-09-19: 5 mg via INTRAVENOUS
  Filled 2021-09-19: qty 0.5

## 2021-09-19 MED ORDER — FLECAINIDE ACETATE 50 MG PO TABS
100.0000 mg | ORAL_TABLET | Freq: Two times a day (BID) | ORAL | Status: DC
  Administered 2021-09-19 – 2021-09-23 (×7): 100 mg via ORAL
  Filled 2021-09-19 (×7): qty 2

## 2021-09-19 MED ORDER — ONDANSETRON HCL 4 MG/2ML IJ SOLN
4.0000 mg | Freq: Four times a day (QID) | INTRAMUSCULAR | Status: DC | PRN
  Administered 2021-09-19: 4 mg via INTRAVENOUS
  Filled 2021-09-19: qty 2

## 2021-09-19 MED ORDER — ACETAMINOPHEN 650 MG RE SUPP: 650.0000 mg | Freq: Four times a day (QID) | RECTAL | Status: DC | PRN

## 2021-09-19 MED ORDER — ACETAMINOPHEN 325 MG PO TABS: 650.0000 mg | ORAL_TABLET | Freq: Four times a day (QID) | ORAL | Status: DC | PRN

## 2021-09-19 MED ORDER — CALCITRIOL 0.25 MCG PO CAPS
0.2500 ug | ORAL_CAPSULE | ORAL | Status: DC
  Administered 2021-09-21 – 2021-09-23 (×2): 0.25 ug via ORAL
  Filled 2021-09-19 (×3): qty 1

## 2021-09-19 MED ORDER — CITALOPRAM HYDROBROMIDE 20 MG PO TABS
20.0000 mg | ORAL_TABLET | Freq: Every day | ORAL | Status: DC
  Administered 2021-09-21 – 2021-09-23 (×3): 20 mg via ORAL
  Filled 2021-09-19 (×3): qty 1

## 2021-09-19 MED ORDER — LEVOTHYROXINE SODIUM 75 MCG PO TABS
75.0000 ug | ORAL_TABLET | Freq: Every day | ORAL | Status: DC
  Administered 2021-09-20 – 2021-09-23 (×4): 75 ug via ORAL
  Filled 2021-09-19 (×4): qty 1

## 2021-09-19 MED ORDER — INSULIN ASPART 100 UNIT/ML IJ SOLN
0.0000 [IU] | Freq: Three times a day (TID) | INTRAMUSCULAR | Status: DC
  Administered 2021-09-20 – 2021-09-21 (×3): 3 [IU] via SUBCUTANEOUS
  Administered 2021-09-21: 8 [IU] via SUBCUTANEOUS
  Administered 2021-09-22: 3 [IU] via SUBCUTANEOUS
  Administered 2021-09-22: 5 [IU] via SUBCUTANEOUS
  Administered 2021-09-22 – 2021-09-23 (×3): 2 [IU] via SUBCUTANEOUS

## 2021-09-19 MED ORDER — POTASSIUM CHLORIDE 10 MEQ/100ML IV SOLN
10.0000 meq | Freq: Once | INTRAVENOUS | Status: AC
Start: 2021-09-19 — End: 2021-09-20
  Administered 2021-09-19: 10 meq via INTRAVENOUS
  Filled 2021-09-19: qty 100

## 2021-09-19 NOTE — ED Provider Notes (Signed)
?[facility] ?Provider Note ? ? ?CSN: [id number] ?Arrival date & time: 09/19/21  1812 ? ?  ? ?History ? ?Chief Complaint  ?Patient presents with  ? Fall  ? ? ?[name] is a 81 y.o. female. ? ? ?Fall ? ? ?This patient is a very pleasant 81 year old female who is on warfarin due to a history of atrial fibrillation and a prior stroke.  She also takes flecainide, furosemide, gabapentin as well as levothyroxine.  She presents to the hospital from home by EMS after she had a trip and fall while she was on the sidewalk outside of her work.  She had no loss of consciousness though she did hit the right side of her head.  She was unable to get up from the ground because of right hip pain that occurred after she struck her hip on the ground.  Paramedics arrived and found the patient to be nonambulatory helped her onto the stretcher, they did not note any significant abnormal vital signs or mental status, the patient is able to talk in full sentences and remembers everything.  This occurred just prior to arrival. ? ?Home Medications ?Prior to Admission medications   ?Medication Sig Start Date End Date Taking? Authorizing Provider  ?albuterol (PROVENTIL) (2.5 MG/3ML) 0.083% nebulizer solution Take 2.5 mg by nebulization every 4 (four) hours as needed for wheezing or shortness of breath. Every 4-6 hours as needed 04/26/20  Yes [provider]  ?albuterol (VENTOLIN HFA) 108 (90 Base) MCG/ACT inhaler Inhale 2 puffs into the lungs every 6 (six) hours as needed for wheezing or shortness of breath. 04/16/20  Yes [provider]  ?allopurinol (ZYLOPRIM) 100 MG tablet Take 200 mg by mouth daily. 03/15/17  Yes [provider]  ?atorvastatin (LIPITOR) 20 MG tablet Take 1 tablet by mouth daily. 08/29/20  Yes [provider]  ?calcitRIOL (ROCALTROL) 0.25 MCG capsule Take 0.25-0.5 mcg by mouth See admin instructions. Take 2 tablets by mouth every other day and take 1 tablet daily on  the other days   Yes [provider]  ?cholecalciferol (VITAMIN D3) 25 MCG (1000 UNIT) tablet Take 2,000 Units by mouth daily.   Yes [provider]  ?citalopram (CELEXA) 20 MG tablet Take 20 mg by mouth daily.   Yes [provider]  ?DHA-EPA-Flaxseed Oil-Vitamin E CAPS Take 1 capsule by mouth daily.   Yes [provider]  ?flecainide (TAMBOCOR) 100 MG tablet Take 1 tablet (100 mg total) by mouth 2 (two) times daily. 05/02/19  Yes Allred, Jeneen Rinks, MD  ?furosemide (LASIX) 80 MG tablet Take 2 tablets (160 mg total) by mouth 2 (two) times daily. 05/03/20  Yes Barton Dubois, MD  ?gabapentin (NEURONTIN) 100 MG capsule Take 1 capsule (100 mg total) by mouth 3 (three) times daily. 03/25/15  Yes Carole Civil, MD  ?glipiZIDE (GLUCOTROL XL) 10 MG 24 hr tablet Take 10 mg by mouth daily.   Yes [provider]  ?HYDROcodone-acetaminophen (NORCO) 7.5-325 MG tablet Take 1 tablet by mouth 3 (three) times daily as needed for severe pain. 03/26/20  Yes [provider]  ?levothyroxine (SYNTHROID, LEVOTHROID) 75 MCG tablet Take 75 mcg by mouth daily before breakfast.   Yes [provider]  ?vitamin B-12 (CYANOCOBALAMIN) 1000 MCG tablet Take 5,000 mcg by mouth daily.   Yes [provider]  ?warfarin (COUMADIN) 5 MG tablet Take 2.5-5 mg by mouth See admin instructions. '5mg'$  Mon, Wed, Fri, Sat; 2.'5mg'$  other days 02/06/12  Yes Hope,  Leona Carry, PA-C  ?   ? ?Allergies    ?Fiorinal [butalbital-aspirin-caffeine], Penicillins, and Tetanus toxoids   ? ?Review of Systems   ?Review of Systems  ?All other systems reviewed and are negative. ? ?Physical Exam ?Updated Vital Signs ?BP (!) 158/60   Pulse 61   Temp 98.1 ?F (36.7 ?C) (Oral)   Resp 11   Ht 1.651 m ('5\' 5"'$ )   Wt 70.3 kg   SpO2 96%   BMI 25.79 kg/m?  ?Physical Exam ?Vitals and nursing note reviewed.  ?Constitutional:   ?   General: She is not in acute distress. ?   Appearance: She is well-developed.  ?HENT:  ?    Head: Normocephalic and atraumatic.  ?   Comments: No obvious abnormalities palpated on the scalp however she is tenderness on the right parietal scalp ?   Mouth/Throat:  ?   Pharynx: No oropharyngeal exudate.  ?Eyes:  ?   General: No scleral icterus.    ?   Right eye: No discharge.     ?   Left eye: No discharge.  ?   Conjunctiva/sclera: Conjunctivae normal.  ?   Pupils: Pupils are equal, round, and reactive to light.  ?Neck:  ?   Thyroid: No thyromegaly.  ?   Vascular: No JVD.  ?Cardiovascular:  ?   Rate and Rhythm: Normal rate. Rhythm irregular.  ?   Heart sounds: Normal heart sounds. No murmur heard. ?  No friction rub. No gallop.  ?Pulmonary:  ?   Effort: Pulmonary effort is normal. No respiratory distress.  ?   Breath sounds: Normal breath sounds. No wheezing or rales.  ?Abdominal:  ?   General: Bowel sounds are normal. There is no distension.  ?   Palpations: Abdomen is soft. There is no mass.  ?   Tenderness: There is no abdominal tenderness.  ?Musculoskeletal:     ?   General: Tenderness present. No swelling.  ?   Cervical back: Normal range of motion and neck supple.  ?   Right lower leg: Edema present.  ?   Left lower leg: Edema present.  ?   Comments: Decreased range of motion of the right hip but has totally normal range of motion of all other extremities.  She has no leg length discrepancies, compartments are very soft  ?Lymphadenopathy:  ?   Cervical: No cervical adenopathy.  ?Skin: ?   General: Skin is warm and dry.  ?   Findings: No erythema or rash.  ?Neurological:  ?   Mental Status: She is alert.  ?   Coordination: Coordination normal.  ?   Comments: Normal sensation to the right lower extremity  ?Psychiatric:     ?   Behavior: Behavior normal.  ? ? ?ED Results / Procedures / Treatments   ?Labs ?(all labs ordered are listed, but only abnormal results are displayed) ?Labs Reviewed  ?BASIC METABOLIC PANEL - Abnormal; Notable for the following components:  ?    Result Value  ? Potassium 2.8 (*)   ?  Glucose, Bld 134 (*)   ? BUN 25 (*)   ? Creatinine, Ser 1.30 (*)   ? GFR, Estimated 42 (*)   ? All other components within normal limits  ?CBC WITH DIFFERENTIAL/PLATELET - Abnormal; Notable for the following components:  ? RBC 3.71 (*)   ? Hemoglobin 11.9 (*)   ? Platelets 131 (*)   ? All other components within normal limits  ?PROTIME-INR - Abnormal; Notable for the  following components:  ? Prothrombin Time 26.9 (*)   ? INR 2.5 (*)   ? All other components within normal limits  ?TYPE AND SCREEN  ? ? ?EKG ?None ? ?Radiology ?CT Head Wo Contrast ? ?Result Date: 09/19/2021 ?CLINICAL DATA:  Head trauma, GCS=15, scalp hematoma (Ped 0-1y) on coumadin EXAM: CT HEAD WITHOUT CONTRAST TECHNIQUE: Contiguous axial images were obtained from the base of the skull through the vertex without intravenous contrast. RADIATION DOSE REDUCTION: This exam was performed according to the departmental dose-optimization program which includes automated exposure control, adjustment of the mA and/or kV according to patient size and/or use of iterative reconstruction technique. COMPARISON:  08/27/2016 FINDINGS: Brain: No evidence of acute infarction, hemorrhage, hydrocephalus, extra-axial collection or mass lesion/mass effect. Remote right MCA territory infarction. Patchy low-density changes within the periventricular and subcortical white matter compatible with chronic microvascular ischemic change. Mild diffuse cerebral volume loss. Vascular: Atherosclerotic calcifications involving the large vessels of the skull base. No unexpected hyperdense vessel. Skull: Normal. Negative for fracture or focal lesion. Sinuses/Orbits: No acute finding. Other: None. IMPRESSION: 1. No acute intracranial abnormality. 2. Remote right MCA territory infarction. Electronically Signed   By: Davina Poke D.O.   On: 09/19/2021 19:22  ? ?DG Chest Port 1 View ? ?Result Date: 09/19/2021 ?CLINICAL DATA:  Fall, trauma, pain. EXAM: PORTABLE CHEST 1 VIEW COMPARISON:   04/28/2020 FINDINGS: Dual lead left-sided pacemaker in place. Chronic elevation of left hemidiaphragm. Stable heart size and mediastinal contours. Aortic atherosclerosis. Incidental azygous fissure. Previous COV

## 2021-09-19 NOTE — Consult Note (Signed)
Reason for Consult:right hip fracture  ?Referring Physician: Dr Noemi Chapel  ? ?[name] is an 81 y.o. female.  ?HPI: 81 yo female w/ hypothyroid, DM, HTN, Afib, therapeutic on warfarin, dual chamber pacemaker fell from mechanical fall c/o right hip pain non radiating, severe, increased with movement  ? ?Past Medical History:  ?Diagnosis Date  ? A-fib ([place])   ? 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 Chronic kidney disease   ? Colon polyps   ? CVA (cerebral vascular accident) [facility])   ? left sided weakness. Has 40% weakness of left arm  ? Depression   ? DM (diabetes mellitus) ([place])   ? GERD (gastroesophageal reflux disease)   ? HTN (hypertension)   ? Hypercholesterolemia   ? Hypothyroidism   ? Pancreatitis   ? biliary  ? S/P colonoscopy 07/2004  ? Dr. Olevia Perches: per e-chart report: diverticulosis, path adenomatous polyps  ? S/P endoscopy 07/2007  ? multiple 3-4 mmsessile gastric polyps, benign, singl antral erosions, negative H.pylori, reactive gastropathy ? NSAIDs  ? Symptomatic bradycardia   ? 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 ? ?Past Surgical History:  ?Procedure Laterality Date  ? CATARACT EXTRACTION W/PHACO Left 04/07/2021  ? Procedure: CATARACT EXTRACTION PHACO AND INTRAOCULAR LENS PLACEMENT LEFT EYE;  Surgeon: Baruch Goldmann, MD;  Location: [facility] OR[facility];  Service: Ophthalmology;  Laterality: Left;  left ?CDE=11.11  ? CATARACT EXTRACTION W/PHACO Right 04/21/2021  ? Procedure: CATARACT EXTRACTION PHACO AND INTRAOCULAR LENS PLACEMENT RIGHT EYE;  Surgeon: Baruch Goldmann, MD;  Location: [facility];  Service: Ophthalmology;  Laterality: Right;  right ?CDE=10.12  ? CHOLECYSTECTOMY    ? COLONOSCOPY  03/10/2011  ? Procedure: COLONOSCOPY;  Surgeon: Daneil Dolin, MD;  Location: [facility] END[facility];  Service: Endoscopy;  Laterality: N/A;  12:45  ? COLONOSCOPY WITH PROPOFOL N/A 07/22/2020  ? Procedure: COLONOSCOPY WITH PROPOFOL;  Surgeon: Daneil Dolin, MD;  Location: [facility];  Service: Endoscopy;  Laterality: N/A;  10:30am  ?  PACEMAKER INSERTION  02/05/2012  ? SJM Accent DR RF implanted by Dr [name] for symptomatic bradycardia  ? PERMANENT PACEMAKER INSERTION N/A 02/05/2012  ? Procedure: PERMANENT PACEMAKER INSERTION;  Surgeon: Thompson Grayer, MD;  Location: [facility] CATH LAB;  Service: Cardiovascular;  Laterality: N/A;  ? POLYPECTOMY  07/22/2020  ? Procedure: POLYPECTOMY;  Surgeon: Daneil Dolin, MD;  Location: [facility];  Service: Endoscopy;;  ? SHOULDER SURGERY  2008  ? left shoulder replacement due to fracture   ? SKIN CANCER EXCISION Left   ? Skin cancer on left side of face.  ? ? ?Family History  ?Problem Relation Age of Onset  ? Heart disease Other   ? Arthritis Other   ? Lung disease Other   ? Diabetes Other   ? Kidney disease Other   ? Cancer Mother   ? Heart disease Father   ? Colon cancer Neg Hx   ? ? ?Social History:  reports that she has never smoked. She has never used smokeless tobacco. She reports that she does not drink alcohol and does not use drugs. ? ?Allergies:  ?Allergies  ?Allergen Reactions  ? Fiorinal [Butalbital-Aspirin-Caffeine] Hives  ? Penicillins Rash  ? Tetanus Toxoids Rash  ? ? ?Medications:  ?Current Facility-Administered Medications:  ?  phytonadione (VITAMIN K) 5 mg in dextrose 5 % 50 mL IVPB, 5 mg, Intravenous, Once, Noemi Chapel, MD ?  potassium chloride (KLOR-CON) packet 40 mEq, 40 mEq, Oral, Once, Zierle-Ghosh, Asia B, DO ?  potassium chloride 10 mEq in 100 mL IVPB, 10 mEq, Intravenous, Once, Noemi Chapel, MD, Last Rate: 100 mL/hr at 09/19/21 2036, 10 mEq at 09/19/21 2036 ? ?Current Outpatient Medications:  ?  albuterol (PROVENTIL) (2.5 MG/3ML) 0.083% nebulizer solution, Take 2.5 mg by nebulization every 4 (four) hours as needed for wheezing or shortness of breath. Every 4-6 hours as needed, Disp: , Rfl:  ?  albuterol (VENTOLIN HFA) 108 (90 Base) MCG/ACT inhaler, Inhale 2 puffs into the lungs every 6 (six) hours as needed for wheezing or shortness of breath., Disp: , Rfl:  ?  allopurinol (ZYLOPRIM)  100 MG tablet, Take 200 mg by mouth daily., Disp: , Rfl:  ?  atorvastatin (LIPITOR) 20 MG tablet, Take 1 tablet by mouth daily., Disp: , Rfl:  ?  calcitRIOL (ROCALTROL) 0.25 MCG capsule, Take 0.25-0.5 mcg by mouth See admin instructions. Take 2 tablets by mouth every other day and take 1 tablet daily on the other days, Disp: , Rfl:  ?  cholecalciferol (VITAMIN D3) 25 MCG (1000 UNIT) tablet, Take 2,000 Units by mouth daily., Disp: , Rfl:  ?  citalopram (CELEXA) 20 MG tablet, Take 20 mg by mouth daily., Disp: , Rfl:  ?  DHA-EPA-Flaxseed Oil-Vitamin E CAPS, Take 1 capsule by mouth daily., Disp: , Rfl:  ?  flecainide (TAMBOCOR) 100 MG tablet, Take 1 tablet (100 mg total) by mouth 2 (two) times daily., Disp: 180 tablet, Rfl: 3 ?  furosemide (LASIX) 80 MG tablet, Take 2 tablets (160 mg total) by mouth 2 (two) times daily., Disp: , Rfl:  ?  gabapentin (NEURONTIN) 100 MG capsule, Take 1 capsule (100 mg total) by mouth 3 (three) times daily., Disp: 90 capsule, Rfl: 5 ?  glipiZIDE (GLUCOTROL XL) 10 MG 24 hr tablet, Take 10 mg by mouth daily., Disp: , Rfl:  ?  HYDROcodone-acetaminophen (NORCO) 7.5-325 MG tablet, Take 1 tablet by mouth 3 (three) times daily as needed for severe pain., Disp: , Rfl:  ?  levothyroxine (SYNTHROID, LEVOTHROID) 75 MCG tablet, Take 75 mcg by mouth daily before breakfast., Disp: , Rfl:  ?  vitamin B-12 (CYANOCOBALAMIN) 1000 MCG tablet, Take 5,000 mcg by mouth daily., Disp: , Rfl:  ?  warfarin (COUMADIN) 5 MG tablet, Take 2.5-5 mg by mouth See admin instructions. '5mg'$  Mon, Wed, Fri, Sat; 2.'5mg'$  other days, Disp: , Rfl:  ? ? ?Results for orders placed or performed during the hospital encounter of 09/19/21 (from the past 48 hour(s))  ?Basic metabolic panel     Status: Abnormal  ? Collection Time: 09/19/21  6:40 PM  ?Result Value Ref Range  ? Sodium 140 135 - 145 mmol/L  ? Potassium 2.8 (L) 3.5 - 5.1 mmol/L  ? Chloride 98 98 - 111 mmol/L  ? CO2 32 22 - 32 mmol/L  ? Glucose, Bld 134 (H) 70 - 99 mg/dL  ?   Comment: Glucose reference range applies only to samples taken after fasting for at least 8 hours.  ? BUN 25 (H) 8 - 23 mg/dL  ? Creatinine, Ser 1.30 (H) 0.44 - 1.00 mg/dL  ? Calcium 9.0 8.9 - 10.3 mg/dL  ? GFR, Estimated 42 (L) >60 mL/min  ?  Comment: (NOTE) ?Calculated using the CKD-EPI Creatinine Equation (2021) ?  ? Anion gap 10 5 - 15  ?  Comment: Performed at [facility], [address], [place], [place] [address]  ?CBC with Differential     Status: Abnormal  ? Collection Time: 09/19/21  6:40 PM  ?Result  Value Ref Range  ? WBC 5.8 4.0 - 10.5 K/uL  ? RBC 3.71 (L) 3.87 - 5.11 MIL/uL  ? Hemoglobin 11.9 (L) 12.0 - 15.0 g/dL  ? HCT 36.5 36.0 - 46.0 %  ? MCV 98.4 80.0 - 100.0 fL  ? MCH 32.1 26.0 - 34.0 pg  ? MCHC 32.6 30.0 - 36.0 g/dL  ? RDW 14.4 11.5 - 15.5 %  ? Platelets 131 (L) 150 - 400 K/uL  ? nRBC 0.0 0.0 - 0.2 %  ? Neutrophils Relative % 50 %  ? Neutro Abs 2.9 1.7 - 7.7 K/uL  ? Lymphocytes Relative 40 %  ? Lymphs Abs 2.3 0.7 - 4.0 K/uL  ? Monocytes Relative 6 %  ? Monocytes Absolute 0.3 0.1 - 1.0 K/uL  ? Eosinophils Relative 4 %  ? Eosinophils Absolute 0.2 0.0 - 0.5 K/uL  ? Basophils Relative 0 %  ? Basophils Absolute 0.0 0.0 - 0.1 K/uL  ? Immature Granulocytes 0 %  ? Abs Immature Granulocytes 0.02 0.00 - 0.07 K/uL  ?  Comment: Performed at [facility], [address] [place], [place] [address]  ?Protime-INR     Status: Abnormal  ? Collection Time: 09/19/21  6:40 PM  ?Result Value Ref Range  ? Prothrombin Time 26.9 (H) 11.4 - 15.2 seconds  ? INR 2.5 (H) 0.8 - 1.2  ?  Comment: (NOTE) ?INR goal varies based on device and disease states. ?Performed at [facility], [address], [place], [place] [address] ?  ?Type and screen [facility]     Status: None (Preliminary result)  ? Collection Time: 09/19/21  6:40 PM  ?Result Value Ref Range  ? ABO/RH(D) O POS   ? Antibody Screen PENDING   ? Sample Expiration    ?  09/22/2021,2359 ?Performed at [facility], [address] [place], [place] [address] ?   ? ? ?CT Head Wo Contrast ? ?Result Date: 09/19/2021 ?CLINICAL DATA:  Head trauma, GCS=15, scalp hematoma (Ped 0-1y) on coumadin EXAM: CT HEAD WITHOUT CONTRAST TECHNIQUE: Contiguous axial images were obtained from th

## 2021-09-19 NOTE — Assessment & Plan Note (Signed)
Continue statin. 

## 2021-09-19 NOTE — ED Notes (Signed)
Linens and purwick changed and pt repositioned.  ?

## 2021-09-19 NOTE — Consult Note (Signed)
?  09/19/2021 ?8:35 PM ? ?The emergency room is called me to consult on 81 year old female with history of atrial fibrillation and looks like she has a pacemaker she has a right hip fracture.  The fracture looks like it can be pinned however, that depends on the appearance on the fracture table and an intraoperative decision will have to be made regarding pinning versus bipolar replacement ? ?The patient will have to have her pacemaker interrogated and prepared for surgery therefore cardiology consult will be needed ? ?The patient also has a INR 2.5 which will require correction with vitamin K ? ?Surgery will be performed when the INR has normalized and the pacemaker has been interrogated and prepared for surgery ? ? ?

## 2021-09-19 NOTE — Assessment & Plan Note (Signed)
Continue Synthroid °

## 2021-09-19 NOTE — ED Triage Notes (Signed)
Pt presents to ED via [facility] for fall at home from standing position walking home from work, no LOC, hit back of head on concrete, right hip pain. Pt takes coumadin.  ?

## 2021-09-19 NOTE — Assessment & Plan Note (Addendum)
Potassium was  2.8 ?Status follow-up with CMP, and replacement with p.o. KCl, ?

## 2021-09-19 NOTE — Assessment & Plan Note (Addendum)
-   Secondary to mechanical fall ?-Status post ORIF, postop day #2 ?-UA for preop assessment-clear ?-EKG shows paced rhythm ?-Pain control with Tylenol, Norco, morphine based on pain scale ? ?

## 2021-09-19 NOTE — Assessment & Plan Note (Addendum)
Resume glipizide ?-Check CBG QA CHS as needed, with SSI coverage ?-Diabetic diet ?

## 2021-09-19 NOTE — Assessment & Plan Note (Addendum)
-   Resume flecainide ?-On warfarin with therapeutic INR ?-Reversing warfarin with vitamin K for surgery  ?Resume Coumadin now with INR goal 2-3 ? ?

## 2021-09-19 NOTE — H&P (Signed)
?History and Physical  ? ? ?Patient: [name] VXB:[id number] DOB: [birth year] ?DOA: 09/19/2021 ?DOS: the patient was seen and examined on 09/19/2021 ?PCP: Celene Squibb, MD  ?Patient coming from: Home ? ?Chief Complaint:  ?Chief Complaint  ?Patient presents with  ? Fall  ? ?HPI: [name] is a 81 y.o. female with medical history significant of atrial fibrillation, chronic kidney disease, CVA several decades ago, diabetes mellitus type 2, GERD, hypertension, hyperlipidemia, hypothyroidism, and more presents ED with a chief complaint of fall.  Patient reports that she had 2 falls this week.  1 was after church on Sunday, she was walking in a straight line, turned her head to talk to somebody and ended up twirling until she almost fell down.  She does not describe it as being dizzy.  She describes it as when she turns her head she just keeps turning.  She does not feel lightheaded when this happens either.  Encouraged somebody caught her.  Today, she was walking down [address] and turned her head to talk to somebody and the same thing happened.  She felt like she was twirling in a circle and she fell down landing directly on her right hip.  Patient reports she also hit her head on the right side.  She has no tenderness over her skull, she did not blackout, she has no change in vision, no change in hearing.  Patient reports that her pain is 10 out of 10 when she tries to move her right leg.  It is a sharp grabbing pain.  At rest her pain is a 5 out of 10, and more like a dull ache.  The pain radiates toward her groin and the inside of her leg.  She has no numbness. ? ?On review of systems patient does report that she had a stomach virus 2 weeks ago.  She had nausea, vomiting, diarrhea.  Since then she has had a decreased appetite.  Her potassium is low today at, which is likely partially due to the stomach virus, partially due to low appetite, partially due to Lasix daily. ? ?Patient has no other complaints  at this time. ? ?Patient does not smoke, does not drink, does not use illicit drugs.  She is vaccinated for COVID.  Patient is full code. ?Review of Systems: As mentioned in the history of present illness. All other systems reviewed and are negative. ?Past Medical History:  ?Diagnosis Date  ? A-fib ([place])   ? 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 Chronic kidney disease   ? Colon polyps   ? CVA (cerebral vascular accident) [facility])   ? left sided weakness. Has 40% weakness of left arm  ? Depression   ? DM (diabetes mellitus) ([place])   ? GERD (gastroesophageal reflux disease)   ? HTN (hypertension)   ? Hypercholesterolemia   ? Hypothyroidism   ? Pancreatitis   ? biliary  ? S/P colonoscopy 07/2004  ? Dr. Olevia Perches: per e-chart report: diverticulosis, path adenomatous polyps  ? S/P endoscopy 07/2007  ? multiple 3-4 mmsessile gastric polyps, benign, singl antral erosions, negative H.pylori, reactive gastropathy ? NSAIDs  ? Symptomatic bradycardia   ? 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 ?Past Surgical History:  ?Procedure Laterality Date  ? CATARACT EXTRACTION W/PHACO Left 04/07/2021  ? Procedure: CATARACT EXTRACTION PHACO AND INTRAOCULAR LENS PLACEMENT LEFT EYE;  Surgeon: Baruch Goldmann, MD;  Location: [facility];  Service: Ophthalmology;  Laterality: Left;  left ?CDE=11.11  ?  CATARACT EXTRACTION W/PHACO Right 04/21/2021  ? Procedure: CATARACT EXTRACTION PHACO AND INTRAOCULAR LENS PLACEMENT RIGHT EYE;  Surgeon: Baruch Goldmann, MD;  Location: [facility];  Service: Ophthalmology;  Laterality: Right;  right ?CDE=10.12  ? CHOLECYSTECTOMY    ? COLONOSCOPY  03/10/2011  ? Procedure: COLONOSCOPY;  Surgeon: Daneil Dolin, MD;  [facility];  Service: Endoscopy;  Laterality: N/A;  12:45  ? COLONOSCOPY WITH PROPOFOL N/A 07/22/2020  ? Procedure: COLONOSCOPY WITH PROPOFOL;  Surgeon: Daneil Dolin, MD;  [facility];  Service: Endoscopy;  Laterality: N/A;  10:30am  ? PACEMAKER INSERTION  02/05/2012  ? SJM Accent DR RF implanted by Dr  [name] for symptomatic bradycardia  ? PERMANENT PACEMAKER INSERTION N/A 02/05/2012  ? Procedure: PERMANENT PACEMAKER INSERTION;  Surgeon: [place] Grayer, MD;  Location: [facility] CATH LAB;  Service: Cardiovascular;  Laterality: N/A;  ? POLYPECTOMY  07/22/2020  ? Procedure: POLYPECTOMY;  Surgeon: Daneil Dolin, MD;  [facility];  Service: Endoscopy;;  ? SHOULDER SURGERY  2008  ? left shoulder replacement due to fracture   ? SKIN CANCER EXCISION Left   ? Skin cancer on left side of face.  ? ?Social History:  reports that she has never smoked. She has never used smokeless tobacco. She reports that she does not drink alcohol and does not use drugs. ? ?Allergies  ?Allergen Reactions  ? Fiorinal [Butalbital-Aspirin-Caffeine] Hives  ? Penicillins Rash  ? Tetanus Toxoids Rash  ? ? ?Family History  ?Problem Relation Age of Onset  ? Heart disease Other   ? Arthritis Other   ? Lung disease Other   ? Diabetes Other   ? Kidney disease Other   ? Cancer Mother   ? Heart disease Father   ? Colon cancer Neg Hx   ? ? ?Prior to Admission medications   ?Medication Sig Start Date End Date Taking? Authorizing Provider  ?albuterol (PROVENTIL) (2.5 MG/3ML) 0.083% nebulizer solution Take 2.5 mg by nebulization every 4 (four) hours as needed for wheezing or shortness of breath. Every 4-6 hours as needed 04/26/20  Yes [provider]  ?albuterol (VENTOLIN HFA) 108 (90 Base) MCG/ACT inhaler Inhale 2 puffs into the lungs every 6 (six) hours as needed for wheezing or shortness of breath. 04/16/20  Yes [provider]  ?allopurinol (ZYLOPRIM) 100 MG tablet Take 200 mg by mouth daily. 03/15/17  Yes [provider]  ?atorvastatin (LIPITOR) 20 MG tablet Take 1 tablet by mouth daily. 08/29/20  Yes [provider]  ?calcitRIOL (ROCALTROL) 0.25 MCG capsule Take 0.25-0.5 mcg by mouth See admin instructions. Take 2 tablets by mouth every other day and take 1 tablet daily on the other days   Yes [provider]  ?cholecalciferol (VITAMIN D3) 25 MCG (1000 UNIT) tablet Take 2,000 Units by mouth daily.   Yes [provider]  ?citalopram (CELEXA) 20 MG tablet Take 20 mg by mouth daily.   Yes [provider]  ?DHA-EPA-Flaxseed Oil-Vitamin E CAPS Take 1 capsule by mouth daily.   Yes [provider]  ?flecainide (TAMBOCOR) 100 MG tablet Take 1 tablet (100 mg total) by mouth 2 (two) times daily. 05/02/19  Yes Allred, Jeneen Rinks, MD  ?furosemide (LASIX) 80 MG tablet Take 2 tablets (160 mg total) by mouth 2 (two) times daily. 05/03/20  Yes Barton Dubois, MD  ?gabapentin (NEURONTIN) 100 MG capsule Take 1 capsule (100 mg total) by mouth 3 (three) times daily. 03/25/15  Yes Carole Civil, MD  ?  glipiZIDE (GLUCOTROL XL) 10 MG 24 hr tablet Take 10 mg by mouth daily.   Yes [provider]  ?HYDROcodone-acetaminophen (NORCO) 7.5-325 MG tablet Take 1 tablet by mouth 3 (three) times daily as needed for severe pain. 03/26/20  Yes [provider]  ?levothyroxine (SYNTHROID, LEVOTHROID) 75 MCG tablet Take 75 mcg by mouth daily before breakfast.   Yes [provider]  ?vitamin B-12 (CYANOCOBALAMIN) 1000 MCG tablet Take 5,000 mcg by mouth daily.   Yes [provider]  ?warfarin (COUMADIN) 5 MG tablet Take 2.5-5 mg by mouth See admin instructions. '5mg'$  Mon, Wed, Fri, Sat; 2.'5mg'$  other days 02/06/12  Yes Hope, Leona Carry, PA-C  ? ? ?Physical Exam: ?Vitals:  ? 09/19/21 1826 09/19/21 1830 09/19/21 1831 09/19/21 1900  ?BP:  (!) 158/60  (!) 170/58  ?Pulse:  61  62  ?Resp:  11  18  ?Temp:   98.1 ?F (36.7 ?C)   ?TempSrc:   Oral   ?SpO2:  96%  99%  ?Weight: 70.3 kg     ?Height: '5\' 5"'$  (1.651 m)     ? ?1.  General: ?Patient lying supine in bed,  no acute distress ?  ?2. Psychiatric: ?Alert and oriented x 3, mood and behavior normal for situation, pleasant and cooperative with exam ?  ?3. Neurologic: ?Speech and language are normal, face is symmetric, moves all 4 extremities voluntarily, at  baseline without acute deficits on limited exam ?  ?4. HEENMT:  ?Head is atraumatic, normocephalic, pupils reactive to light, neck is supple, trachea is midline, mucous membranes are moist ?  ?5. Respiratory

## 2021-09-20 ENCOUNTER — Encounter (HOSPITAL_COMMUNITY)
Admission: EM | Disposition: A | Discharge status: Skilled Nursing Facility | Payer: Self-pay | Source: Home / Self Care | Attending: Family Medicine

## 2021-09-20 ENCOUNTER — Other Ambulatory Visit: Payer: Self-pay

## 2021-09-20 ENCOUNTER — Inpatient Hospital Stay (HOSPITAL_COMMUNITY): Payer: Medicare HMO

## 2021-09-20 ENCOUNTER — Inpatient Hospital Stay (HOSPITAL_COMMUNITY): Payer: Medicare HMO | Admitting: Anesthesiology

## 2021-09-20 DIAGNOSIS — S72001A Fracture of unspecified part of neck of right femur, initial encounter for closed fracture: Secondary | ICD-10-CM | POA: Diagnosis not present

## 2021-09-20 HISTORY — PX: HIP PINNING,CANNULATED: SHX1758

## 2021-09-20 LAB — CBC WITH DIFFERENTIAL/PLATELET
Abs Immature Granulocytes: 0.03 10*3/uL (ref 0.00–0.07)
Basophils Absolute: 0 10*3/uL (ref 0.0–0.1)
Basophils Relative: 0 %
Eosinophils Absolute: 0.3 10*3/uL (ref 0.0–0.5)
Eosinophils Relative: 4 %
HCT: 34.9 % — ABNORMAL LOW (ref 36.0–46.0)
Hemoglobin: 11.5 g/dL — ABNORMAL LOW (ref 12.0–15.0)
Immature Granulocytes: 0 %
Lymphocytes Relative: 16 %
Lymphs Abs: 1.3 10*3/uL (ref 0.7–4.0)
MCH: 32.5 pg (ref 26.0–34.0)
MCHC: 33 g/dL (ref 30.0–36.0)
MCV: 98.6 fL (ref 80.0–100.0)
Monocytes Absolute: 0.4 10*3/uL (ref 0.1–1.0)
Monocytes Relative: 4 %
Neutro Abs: 6.5 10*3/uL (ref 1.7–7.7)
Neutrophils Relative %: 76 %
Platelets: 114 10*3/uL — ABNORMAL LOW (ref 150–400)
RBC: 3.54 MIL/uL — ABNORMAL LOW (ref 3.87–5.11)
RDW: 14.5 % (ref 11.5–15.5)
WBC: 8.5 10*3/uL (ref 4.0–10.5)
nRBC: 0 % (ref 0.0–0.2)

## 2021-09-20 LAB — COMPREHENSIVE METABOLIC PANEL
ALT: 22 U/L (ref 0–44)
AST: 34 U/L (ref 15–41)
Albumin: 3.5 g/dL (ref 3.5–5.0)
Alkaline Phosphatase: 63 U/L (ref 38–126)
Anion gap: 7 (ref 5–15)
BUN: 24 mg/dL — ABNORMAL HIGH (ref 8–23)
CO2: 34 mmol/L — ABNORMAL HIGH (ref 22–32)
Calcium: 8.9 mg/dL (ref 8.9–10.3)
Chloride: 102 mmol/L (ref 98–111)
Creatinine, Ser: 1.27 mg/dL — ABNORMAL HIGH (ref 0.44–1.00)
GFR, Estimated: 43 mL/min — ABNORMAL LOW (ref >60)
Glucose, Bld: 164 mg/dL — ABNORMAL HIGH (ref 70–99)
Potassium: 4.1 mmol/L (ref 3.5–5.1)
Sodium: 143 mmol/L (ref 135–145)
Total Bilirubin: 0.9 mg/dL (ref 0.3–1.2)
Total Protein: 6.1 g/dL — ABNORMAL LOW (ref 6.5–8.1)

## 2021-09-20 LAB — URINALYSIS, ROUTINE W REFLEX MICROSCOPIC
Bilirubin Urine: NEGATIVE
Glucose, UA: NEGATIVE mg/dL
Hgb urine dipstick: NEGATIVE
Ketones, ur: NEGATIVE mg/dL
Nitrite: NEGATIVE
Protein, ur: NEGATIVE mg/dL
Specific Gravity, Urine: 1.01 (ref 1.005–1.030)
pH: 7 (ref 5.0–8.0)

## 2021-09-20 LAB — MAGNESIUM: Magnesium: 1.8 mg/dL (ref 1.7–2.4)

## 2021-09-20 LAB — PROTIME-INR
INR: 1.3 — ABNORMAL HIGH (ref 0.8–1.2)
Prothrombin Time: 16.4 seconds — ABNORMAL HIGH (ref 11.4–15.2)

## 2021-09-20 LAB — HEMOGLOBIN A1C
Hgb A1c MFr Bld: 6.3 % — ABNORMAL HIGH (ref 4.8–5.6)
Mean Plasma Glucose: 134.11 mg/dL

## 2021-09-20 LAB — GLUCOSE, CAPILLARY
Glucose-Capillary: 135 mg/dL — ABNORMAL HIGH (ref 70–99)
Glucose-Capillary: 94 mg/dL (ref 70–99)
Glucose-Capillary: 187 mg/dL — ABNORMAL HIGH (ref 70–99)
Glucose-Capillary: 249 mg/dL — ABNORMAL HIGH (ref 70–99)

## 2021-09-20 LAB — SURGICAL PCR SCREEN
MRSA, PCR: NEGATIVE
Staphylococcus aureus: NEGATIVE

## 2021-09-20 SURGERY — FIXATION, FEMUR, NECK, PERCUTANEOUS, USING SCREW
Anesthesia: General | Site: Hip | Laterality: Right

## 2021-09-20 MED ORDER — ACETAMINOPHEN 325 MG PO TABS: 325.0000 mg | ORAL_TABLET | Freq: Four times a day (QID) | ORAL | Status: DC | PRN

## 2021-09-20 MED ORDER — PROPOFOL 10 MG/ML IV BOLUS
INTRAVENOUS | Status: AC
  Filled 2021-09-20: qty 20

## 2021-09-20 MED ORDER — ROCURONIUM BROMIDE 10 MG/ML (PF) SYRINGE
PREFILLED_SYRINGE | INTRAVENOUS | Status: DC | PRN
  Administered 2021-09-20: 10 mg via INTRAVENOUS

## 2021-09-20 MED ORDER — LIDOCAINE HCL (PF) 2 % IJ SOLN
INTRAMUSCULAR | Status: AC
  Filled 2021-09-20: qty 5

## 2021-09-20 MED ORDER — SODIUM CHLORIDE 0.9 % IV SOLN: INTRAVENOUS | Status: DC

## 2021-09-20 MED ORDER — METOCLOPRAMIDE HCL 10 MG PO TABS: 5.0000 mg | ORAL_TABLET | Freq: Three times a day (TID) | ORAL | Status: DC | PRN

## 2021-09-20 MED ORDER — ONDANSETRON HCL 4 MG/2ML IJ SOLN: 4.0000 mg | Freq: Four times a day (QID) | INTRAMUSCULAR | Status: DC | PRN

## 2021-09-20 MED ORDER — POVIDONE-IODINE 10 % EX SWAB: 2.0000 "application " | Freq: Once | CUTANEOUS | Status: DC

## 2021-09-20 MED ORDER — VANCOMYCIN HCL 1000 MG IV SOLR
INTRAVENOUS | Status: DC | PRN
  Administered 2021-09-20: 1000 mg via INTRAVENOUS

## 2021-09-20 MED ORDER — SUGAMMADEX SODIUM 200 MG/2ML IV SOLN
INTRAVENOUS | Status: DC | PRN
  Administered 2021-09-20: 300 mg via INTRAVENOUS

## 2021-09-20 MED ORDER — METHOCARBAMOL 500 MG PO TABS
500.0000 mg | ORAL_TABLET | Freq: Four times a day (QID) | ORAL | Status: DC | PRN
Start: 2021-09-20 — End: 2021-09-23

## 2021-09-20 MED ORDER — HYDROCODONE-ACETAMINOPHEN 7.5-325 MG PO TABS: 1.0000 | ORAL_TABLET | ORAL | Status: DC | PRN

## 2021-09-20 MED ORDER — LIDOCAINE HCL (CARDIAC) PF 100 MG/5ML IV SOSY
PREFILLED_SYRINGE | INTRAVENOUS | Status: DC | PRN
  Administered 2021-09-20: 50 mg via INTRATRACHEAL

## 2021-09-20 MED ORDER — VANCOMYCIN HCL IN DEXTROSE 1-5 GM/200ML-% IV SOLN
1000.0000 mg | Freq: Two times a day (BID) | INTRAVENOUS | Status: AC
  Administered 2021-09-21: 1000 mg via INTRAVENOUS
  Filled 2021-09-20: qty 200

## 2021-09-20 MED ORDER — 0.9 % SODIUM CHLORIDE (POUR BTL) OPTIME
TOPICAL | Status: DC | PRN
  Administered 2021-09-20: 1000 mL

## 2021-09-20 MED ORDER — METOCLOPRAMIDE HCL 5 MG/ML IJ SOLN: 5.0000 mg | Freq: Three times a day (TID) | INTRAMUSCULAR | Status: DC | PRN

## 2021-09-20 MED ORDER — ONDANSETRON HCL 4 MG/2ML IJ SOLN
INTRAMUSCULAR | Status: AC
  Filled 2021-09-20: qty 2

## 2021-09-20 MED ORDER — BUPIVACAINE-EPINEPHRINE (PF) 0.5% -1:200000 IJ SOLN
INTRAMUSCULAR | Status: AC
  Filled 2021-09-20: qty 30

## 2021-09-20 MED ORDER — LACTATED RINGERS IV SOLN: INTRAVENOUS | Status: DC | PRN

## 2021-09-20 MED ORDER — MORPHINE SULFATE (PF) 2 MG/ML IV SOLN: 0.5000 mg | INTRAVENOUS | Status: DC | PRN

## 2021-09-20 MED ORDER — HYDROMORPHONE HCL 1 MG/ML IJ SOLN
0.2500 mg | INTRAMUSCULAR | Status: DC | PRN
  Administered 2021-09-20: 0.5 mg via INTRAVENOUS
  Filled 2021-09-20: qty 0.5

## 2021-09-20 MED ORDER — ENSURE ENLIVE PO LIQD
237.0000 mL | Freq: Two times a day (BID) | ORAL | Status: DC
  Administered 2021-09-21 – 2021-09-23 (×4): 237 mL via ORAL

## 2021-09-20 MED ORDER — DOCUSATE SODIUM 100 MG PO CAPS
100.0000 mg | ORAL_CAPSULE | Freq: Two times a day (BID) | ORAL | Status: DC
  Administered 2021-09-20 – 2021-09-23 (×6): 100 mg via ORAL
  Filled 2021-09-20 (×6): qty 1

## 2021-09-20 MED ORDER — METHOCARBAMOL 1000 MG/10ML IJ SOLN
500.0000 mg | Freq: Four times a day (QID) | INTRAVENOUS | Status: DC | PRN
  Filled 2021-09-20: qty 5

## 2021-09-20 MED ORDER — MENTHOL 3 MG MT LOZG: 1.0000 | LOZENGE | OROMUCOSAL | Status: DC | PRN

## 2021-09-20 MED ORDER — TRANEXAMIC ACID-NACL 1000-0.7 MG/100ML-% IV SOLN
1000.0000 mg | Freq: Once | INTRAVENOUS | Status: AC
  Administered 2021-09-20: 1000 mg via INTRAVENOUS
  Filled 2021-09-20: qty 100

## 2021-09-20 MED ORDER — PHENYLEPHRINE HCL-NACL 20-0.9 MG/250ML-% IV SOLN: INTRAVENOUS | Status: DC | PRN

## 2021-09-20 MED ORDER — FENTANYL CITRATE (PF) 100 MCG/2ML IJ SOLN
INTRAMUSCULAR | Status: AC
Start: 2021-09-20 — End: ?
  Filled 2021-09-20: qty 2

## 2021-09-20 MED ORDER — PROPOFOL 10 MG/ML IV BOLUS
INTRAVENOUS | Status: DC | PRN
Start: 2021-09-20 — End: 2021-09-20
  Administered 2021-09-20: 100 mg via INTRAVENOUS

## 2021-09-20 MED ORDER — MUPIROCIN 2 % EX OINT
1.0000 "application " | TOPICAL_OINTMENT | Freq: Two times a day (BID) | CUTANEOUS | Status: DC
  Administered 2021-09-20 – 2021-09-23 (×7): 1 via NASAL
  Filled 2021-09-20 (×2): qty 22

## 2021-09-20 MED ORDER — TRAMADOL HCL 50 MG PO TABS
50.0000 mg | ORAL_TABLET | Freq: Four times a day (QID) | ORAL | Status: DC
  Administered 2021-09-20 – 2021-09-23 (×12): 50 mg via ORAL
  Filled 2021-09-20 (×12): qty 1

## 2021-09-20 MED ORDER — DEXAMETHASONE SODIUM PHOSPHATE 10 MG/ML IJ SOLN
INTRAMUSCULAR | Status: AC
  Filled 2021-09-20: qty 1

## 2021-09-20 MED ORDER — ACETAMINOPHEN 500 MG PO TABS
500.0000 mg | ORAL_TABLET | Freq: Once | ORAL | Status: AC
  Administered 2021-09-20: 500 mg via ORAL
  Filled 2021-09-20: qty 1

## 2021-09-20 MED ORDER — ROCURONIUM BROMIDE 100 MG/10ML IV SOLN
INTRAVENOUS | Status: DC | PRN
  Administered 2021-09-20: 50 mg via INTRAVENOUS

## 2021-09-20 MED ORDER — TRANEXAMIC ACID-NACL 1000-0.7 MG/100ML-% IV SOLN: 1000.0000 mg | INTRAVENOUS | Status: DC

## 2021-09-20 MED ORDER — ONDANSETRON HCL 4 MG PO TABS: 4.0000 mg | ORAL_TABLET | Freq: Four times a day (QID) | ORAL | Status: DC | PRN

## 2021-09-20 MED ORDER — VANCOMYCIN HCL IN DEXTROSE 1-5 GM/200ML-% IV SOLN
INTRAVENOUS | Status: AC
  Filled 2021-09-20: qty 200

## 2021-09-20 MED ORDER — CHLORHEXIDINE GLUCONATE 4 % EX LIQD: 60.0000 mL | Freq: Once | CUTANEOUS | Status: DC

## 2021-09-20 MED ORDER — FENTANYL CITRATE (PF) 100 MCG/2ML IJ SOLN
INTRAMUSCULAR | Status: AC
  Filled 2021-09-20: qty 2

## 2021-09-20 MED ORDER — TRAMADOL HCL 50 MG PO TABS: 50.0000 mg | ORAL_TABLET | Freq: Once | ORAL | Status: DC

## 2021-09-20 MED ORDER — BUPIVACAINE-EPINEPHRINE (PF) 0.5% -1:200000 IJ SOLN
INTRAMUSCULAR | Status: DC | PRN
  Administered 2021-09-20: 30 mL via PERINEURAL

## 2021-09-20 MED ORDER — PHENYLEPHRINE HCL-NACL 20-0.9 MG/250ML-% IV SOLN
INTRAVENOUS | Status: DC | PRN
  Administered 2021-09-20: 20 ug/min via INTRAVENOUS

## 2021-09-20 MED ORDER — PHENYLEPHRINE HCL (PRESSORS) 10 MG/ML IV SOLN
INTRAVENOUS | Status: AC
  Filled 2021-09-20: qty 1

## 2021-09-20 MED ORDER — HYDROCODONE-ACETAMINOPHEN 5-325 MG PO TABS
1.0000 | ORAL_TABLET | ORAL | Status: DC | PRN
  Administered 2021-09-20 – 2021-09-21 (×2): 1 via ORAL
  Administered 2021-09-21: 2 via ORAL
  Administered 2021-09-21: 1 via ORAL
  Administered 2021-09-22 (×2): 2 via ORAL
  Administered 2021-09-22: 1 via ORAL
  Filled 2021-09-20 (×2): qty 2
  Filled 2021-09-20 (×3): qty 1
  Filled 2021-09-20: qty 2
  Filled 2021-09-20: qty 1

## 2021-09-20 MED ORDER — ROCURONIUM BROMIDE 10 MG/ML (PF) SYRINGE
PREFILLED_SYRINGE | INTRAVENOUS | Status: AC
  Filled 2021-09-20: qty 10

## 2021-09-20 MED ORDER — PHENOL 1.4 % MT LIQD: 1.0000 | OROMUCOSAL | Status: DC | PRN

## 2021-09-20 MED ORDER — FENTANYL CITRATE (PF) 100 MCG/2ML IJ SOLN
INTRAMUSCULAR | Status: DC | PRN
  Administered 2021-09-20: 100 ug via INTRAVENOUS
  Administered 2021-09-20 (×2): 50 ug via INTRAVENOUS

## 2021-09-20 MED ORDER — VANCOMYCIN HCL IN DEXTROSE 1-5 GM/200ML-% IV SOLN: 1000.0000 mg | INTRAVENOUS | Status: DC

## 2021-09-20 MED ORDER — DEXAMETHASONE SODIUM PHOSPHATE 10 MG/ML IJ SOLN
INTRAMUSCULAR | Status: DC | PRN
  Administered 2021-09-20: 10 mg via INTRAVENOUS

## 2021-09-20 SURGICAL SUPPLY — 53 items
APL PRP STRL LF DISP 70% ISPRP (MISCELLANEOUS) ×1
BAG HAMPER (MISCELLANEOUS) ×3 IMPLANT
BIT DRILL 4.8X300 (BIT) ×3 IMPLANT
BLADE SURG SZ10 CARB STEEL (BLADE) ×6 IMPLANT
CHLORAPREP W/TINT 26 (MISCELLANEOUS) ×3 IMPLANT
CLOTH BEACON ORANGE TIMEOUT ST (SAFETY) ×3 IMPLANT
COVER LIGHT HANDLE STERIS (MISCELLANEOUS) ×6 IMPLANT
COVER PERINEAL POST (MISCELLANEOUS) ×3 IMPLANT
DECANTER SPIKE VIAL GLASS SM (MISCELLANEOUS) ×5 IMPLANT
DRAPE STERI IOBAN 125X83 (DRAPES) ×3 IMPLANT
DRESSING MEPILEX BORDER 6X8 (GAUZE/BANDAGES/DRESSINGS) ×2 IMPLANT
DRSG MEPILEX BORDER 4X12 (GAUZE/BANDAGES/DRESSINGS) ×3 IMPLANT
DRSG MEPILEX BORDER 6X8 (GAUZE/BANDAGES/DRESSINGS) ×2
DRSG MEPILEX SACRM 8.7X9.8 (GAUZE/BANDAGES/DRESSINGS) ×3 IMPLANT
ELECT REM PT RETURN 9FT ADLT (ELECTROSURGICAL) ×2
ELECTRODE REM PT RTRN 9FT ADLT (ELECTROSURGICAL) ×2 IMPLANT
GLOVE BIOGEL PI IND STRL 7.0 (GLOVE) ×4 IMPLANT
GLOVE BIOGEL PI INDICATOR 7.0 (GLOVE) ×1
GLOVE SS N UNI LF 8.5 STRL (GLOVE) ×3 IMPLANT
GOWN STRL REUS W/TWL LRG LVL3 (GOWN DISPOSABLE) ×6 IMPLANT
GOWN STRL REUS W/TWL XL LVL3 (GOWN DISPOSABLE) ×3 IMPLANT
INST SET MAJOR BONE (KITS) ×3 IMPLANT
KIT BLADEGUARD II DBL (SET/KITS/TRAYS/PACK) ×3 IMPLANT
KIT TURNOVER CYSTO (KITS) ×3 IMPLANT
MANIFOLD NEPTUNE II (INSTRUMENTS) ×3 IMPLANT
MARKER SKIN DUAL TIP RULER LAB (MISCELLANEOUS) ×3 IMPLANT
NDL HYPO 21X1.5 SAFETY (NEEDLE) ×2 IMPLANT
NDL SPNL 18GX3.5 QUINCKE PK (NEEDLE) ×2 IMPLANT
NEEDLE HYPO 21X1.5 SAFETY (NEEDLE) ×2 IMPLANT
NEEDLE SPNL 18GX3.5 QUINCKE PK (NEEDLE) ×2 IMPLANT
NS IRRIG 1000ML POUR BTL (IV SOLUTION) ×3 IMPLANT
PACK BASIC III (CUSTOM PROCEDURE TRAY) ×2
PACK SRG BSC III STRL LF ECLPS (CUSTOM PROCEDURE TRAY) ×2 IMPLANT
PAD ABD 5X9 TENDERSORB (GAUZE/BANDAGES/DRESSINGS) ×3 IMPLANT
PAD ARMBOARD 7.5X6 YLW CONV (MISCELLANEOUS) ×3 IMPLANT
PENCIL SMOKE EVACUATOR COATED (MISCELLANEOUS) ×3 IMPLANT
PIN GUIDE DRILL TIP 2.8X300 (DRILL) ×9 IMPLANT
SCREW CANN 6.5 80MM (Screw) ×2 IMPLANT
SCREW CANN 6.5 85MM (Screw) ×2 IMPLANT
SCREW CANN 6.5 90MM (Screw) ×2 IMPLANT
SCREW CANN LG 6.5 FLT 80X22 (Screw) IMPLANT
SCREW CANN LG 6.5 FLT 85X22 (Screw) IMPLANT
SCREW CANN LG 6.5 FLT 90X22 (Screw) IMPLANT
SET BASIN LINEN APH (SET/KITS/TRAYS/PACK) ×3 IMPLANT
SPONGE T-LAP 18X18 ~~LOC~~+RFID (SPONGE) ×5 IMPLANT
STAPLER VISISTAT 35W (STAPLE) ×3 IMPLANT
SUT BRALON NAB BRD #1 30IN (SUTURE) ×4 IMPLANT
SUT MNCRL 0 VIOLET CTX 36 (SUTURE) ×2 IMPLANT
SUT MON AB 2-0 CT1 36 (SUTURE) ×4 IMPLANT
SUT MONOCRYL 0 CTX 36 (SUTURE) ×4
SYR 20ML LL LF (SYRINGE) ×8 IMPLANT
SYR BULB IRRIG 60ML STRL (SYRINGE) ×6 IMPLANT
YANKAUER SUCT BULB TIP 10FT TU (MISCELLANEOUS) ×3 IMPLANT

## 2021-09-20 NOTE — Progress Notes (Signed)
Spoke with [employer], formally [place] in regards to pacer interrogation. Local rep has been paged out. ?Pacer model [id number] ?Pacer serial [id number] ?Dr. [name] overseeing. #on the the card no longer in service, new number is [phone] ?

## 2021-09-20 NOTE — Progress Notes (Signed)
Patient ID: [name], female   DOB: 1940/06/10, 81 y.o.   MRN: [id number] ? ?BP (!) 120/59   Pulse 64   Temp 98.4 ?F (36.9 ?C) (Oral)   Resp 18   Ht '5\' 5"'$  (1.651 m)   Wt 70.2 kg   SpO2 98%   BMI 25.75 kg/m?  ? ?The INR has corrected to 1.3 the patient is ready for surgery.  The pacemaker interrogation has recommended that a magnet be used during surgery ? ?I will alert the anesthesia to this and make arrangements with them to provide the magnet as requested. ? ? ?

## 2021-09-20 NOTE — Anesthesia Procedure Notes (Signed)
Procedure Name: Intubation ?Date/Time: 09/20/2021 1:10 PM ?Performed by: Denese Killings, MD ?Pre-anesthesia Checklist: Patient identified, Emergency Drugs available, Suction available and Patient being monitored ?Patient Re-evaluated:Patient Re-evaluated prior to induction ?Oxygen Delivery Method: Circle system utilized ?Preoxygenation: Pre-oxygenation with 100% oxygen ?Induction Type: IV induction ?Ventilation: Mask ventilation without difficulty ?Laryngoscope Size: Mac and 3 ?Grade View: Grade I ?Tube type: Oral ?Tube size: 7.0 mm ?Number of attempts: 1 ?Airway Equipment and Method: Stylet ?Placement Confirmation: ETT inserted through vocal cords under direct vision, positive ETCO2 and breath sounds checked- equal and bilateral ?Secured at: 20 cm ?Tube secured with: Tape ?Dental Injury: Teeth and Oropharynx as per pre-operative assessment  ? ? ? ? ?

## 2021-09-20 NOTE — Transfer of Care (Signed)
Immediate Anesthesia Transfer of Care Note ? ?Patient: [name] ? ?Procedure(s) Performed: CANNULATED HIP PINNING (Right: Hip) ? ?Patient Location: PACU ? ?Anesthesia Type:General ? ?Level of Consciousness: awake, alert , drowsy and patient cooperative ? ?Airway & Oxygen Therapy: Patient Spontanous Breathing and Patient connected to nasal cannula oxygen ? ?Post-op Assessment: Report given to RN and Post -op Vital signs reviewed and stable ? ?Post vital signs: Reviewed and stable ? ?Last Vitals:  ?Vitals Value Taken Time  ?BP 156/60 09/20/21 1459  ?Temp 98.5 09/20/21 1459  ?Pulse 63 09/20/21 1459  ?Resp 15 09/20/21 1459  ?SpO2 97 % 09/20/21 1459  ?Vitals shown include unvalidated device data. ? ?Last Pain:  ?Vitals:  ? 09/20/21 1015  ?TempSrc:   ?PainSc: 2   ?   ? ?  ? ?Complications: No notable events documented. ?

## 2021-09-20 NOTE — Anesthesia Preprocedure Evaluation (Addendum)
Anesthesia Evaluation  ?Patient identified by MRN, date of birth, ID band ?Patient awake ? ? ? ?Reviewed: ?Allergy & Precautions, NPO status , Patient's Chart, lab work & pertinent test results ? ?Airway ?Mallampati: II ? ?TM Distance: >3 FB ?Neck ROM: Full ? ? ?Comment: Neck pain Dental ? ?(+) Upper Dentures, Lower Dentures ?  ?Pulmonary ?pneumonia,  ?  ?Pulmonary exam normal ?breath sounds clear to auscultation ? ? ? ? ? ? Cardiovascular ?Exercise Tolerance: Good ?hypertension, Pt. on medications ?+ dysrhythmias Atrial Fibrillation + pacemaker  ?Rhythm:Regular Rate:Normal ?+ Systolic murmurs ? ?  ?Neuro/Psych ?PSYCHIATRIC DISORDERS Depression CVA (left sided weakness), Residual Symptoms   ? GI/Hepatic ?Neg liver ROS, GERD  ,  ?Endo/Other  ?diabetes, Well Controlled, Type 2, Oral Hypoglycemic AgentsHypothyroidism  ? Renal/GU ?Renal InsufficiencyRenal disease  ?negative genitourinary ?  ?Musculoskeletal ? ?(+) Arthritis , Osteoarthritis,   ? Abdominal ?  ?Peds ?negative pediatric ROS ?(+)  Hematology ?negative hematology ROS ?(+)   ?Anesthesia Other Findings ?Back pain, spinal stenosis ? Reproductive/Obstetrics ?negative OB ROS ? ?  ? ? ? ? ? ? ? ? ? ? ? ? ? ?  ?  ? ? ? ? ? ? ?Anesthesia Physical ?Anesthesia Plan ? ?ASA: 3 ? ?Anesthesia Plan: General and General/Spinal  ? ?Post-op Pain Management: Dilaudid IV  ? ?Induction: Intravenous ? ?PONV Risk Score and Plan: 3 and Ondansetron and Dexamethasone ? ?Airway Management Planned: Oral ETT ? ?Additional Equipment:  ? ?Intra-op Plan:  ? ?Post-operative Plan: Extubation in OR ? ?Informed Consent: I have reviewed the patients History and Physical, chart, labs and discussed the procedure including the risks, benefits and alternatives for the proposed anesthesia with the patient or authorized representative who has indicated his/her understanding and acceptance.  ? ? ? ?Dental advisory given ? ?Plan Discussed with: Surgeon ? ?Anesthesia  Plan Comments: (She has chronic  back pain and spinal stenosis, risks and benefits including postop memory issues with GA was explained, patient agreed to get GA.)  ? ? ? ? ?Anesthesia Quick Evaluation ? ?

## 2021-09-20 NOTE — Op Note (Signed)
09/20/2021 ? ?2:55 PM ? ?PATIENT:  [name]  81 y.o. female ? ?This is a 81 year old female had a mechanical fall use sustained a right femoral neck fracture presented to the ER and right hip pain with no ability to ambulate ? ?She has a pacemaker she also has atrial fibrillation she has hypertension among her medical problems ? ?The preop x-rays were inadequate to determine definitively which fixation would be performed so we prepared for a hip pinning and a bipolar replacement ? ?PRE-OPERATIVE DIAGNOSIS:  fracture right hip, valgus impacted femoral neck ? ?POST-OPERATIVE DIAGNOSIS:  fracture right hip, valgus impacted femoral neck ? ?PROCEDURE:  Procedure(s) with comments: ?CANNULATED HIP PINNING (Right) - ? ?SURGEON:  Surgeon(s) and Role: ?   Carole Civil, MD - Primary ? ?Implants 3 fully threaded 6.5 cannulated titanium screws from [employer] with washers ?Size 80, 85 and 90 mm ? ?PHYSICIAN ASSISTANT:  ? ?ASSISTANTS: none  ? ?ANESTHESIA:   general ? ?EBL:  150 mL  ? ?BLOOD ADMINISTERED:none ? ?DRAINS: none  ? ?LOCAL MEDICATIONS USED:  MARCAINE    ? ?SPECIMEN:  No Specimen ? ?DISPOSITION OF SPECIMEN:  N/A ? ?COUNTS:  YES ? ?TOURNIQUET:  * No tourniquets in log * ? ?DICTATION: .Dragon Dictation ? ?PLAN OF CARE: Admit to inpatient  ? ?PATIENT DISPOSITION:  PACU - hemodynamically stable. ?  ?Delay start of Pharmacological VTE agent (>24hrs) due to surgical blood loss or risk of bleeding: no ? ?The patient was seen first this morning after site confirmation the right hip was marked the consent was signed the x-rays were reviewed again.  The patient was cleared for surgery taken to the operating room for general anesthesia after which time she was placed on the fracture table.  There was a padded perineal post the left leg was placed in abduction and a traction device. ? ?The right leg was placed in traction and gentle traction and internal rotation were performed to reduce the fracture ? ?The C-arm was  brought in to confirm the fracture reduction and once we could get a good lateral it was clear that the fracture was amenable to pinning ? ?The right leg was then prepped and draped sterilely followed by timeout ? ?The incision was made at the lateral edge of the trochanter extended down to fascia.  The fascia was split in line with the skin incision.  The vastus lateralis was split with muscle-splitting technique.  Although there was some bleeding it was easily controlled with electrocautery ? ?A periosteal elevator was used to expose the femur and then a drill pin was used to place an inferior pin.  This was confirmed to be inferior on the AP and center on the lateral ? ?We then used a pin guide to place 2 additional pins superiorly in parallel fashion.  Once the pins were placed and confirmed to be in good position each was measured.  We then drilled cortex only and then placed a 90 screw inferiorly and an 80 and 85 screw superiorly with washers ? ?I used cannulated screws to prevent any collapse or shortening of the fracture ? ?The wound was irrigated bleeding was coagulated the closure was performed with 0 Monocryl in 2 layers followed by 2-0 Monocryl and staples.  We injected 30 cc of Marcaine with epinephrine in the subcu muscular tissue ? ?A sterile bandage was applied ? ?Postop plan ?Weightbearing status as tolerated ?Staples to come out postop day 14 ?Images can be performed at 2  weeks, 6 weeks and 12 weeks ?The patient can resume her warfarin as DVT prophylaxis ? ?First postop visit postop day 13 ?

## 2021-09-20 NOTE — Progress Notes (Signed)
Patient has been cleansed with CHG wipes. ?

## 2021-09-20 NOTE — Progress Notes (Signed)
? ?Patient: [name] YDX:[id number] DOB: 09/19/1940 ?DOA: 09/19/2021 ?DOS: the patient was seen and examined on 09/20/2021 ?PCP: Celene Squibb, MD  ?Patient coming from: Home ? ?Chief Complaint:  ?Chief Complaint  ?Patient presents with  ? Fall  ? ?HPI: [name] is a 80 y.o. female with medical history significant of atrial fibrillation, chronic kidney disease, CVA several decades ago, diabetes mellitus type 2, GERD, hypertension, hyperlipidemia, hypothyroidism, and more presents ED with a chief complaint of fall.  Patient reports that she had 2 falls this week.  1 was after church on Sunday, she was walking in a straight line, turned her head to talk to somebody and ended up twirling until she almost fell down.  She does not describe it as being dizzy.  She describes it as when she turns her head she just keeps turning.  She does not feel lightheaded when this happens either.  Encouraged somebody caught her.  Today, she was walking down [address] and turned her head to talk to somebody and the same thing happened.  She felt like she was twirling in a circle and she fell down landing directly on her right hip.  Patient reports she also hit her head on the right side.  She has no tenderness over her skull, she did not blackout, she has no change in vision, no change in hearing.  Patient reports that her pain is 10 out of 10 when she tries to move her right leg.  It is a sharp grabbing pain.  At rest her pain is a 5 out of 10, and more like a dull ache.  The pain radiates toward her groin and the inside of her leg.  She has no numbness. ? ?On review of systems patient does report that she had a stomach virus 2 weeks ago.  She had nausea, vomiting, diarrhea.  Since then she has had a decreased appetite.  Her potassium is low today at, which is likely partially due to the stomach virus, partially due to low appetite, partially due to Lasix daily. ? ?Patient has no other complaints at this time. ? ?Patient  does not smoke, does not drink, does not use illicit drugs.  She is vaccinated for COVID.  Patient is full code. ? ?Subjective ?Patient would like to stay here at [facility] for her surgery.  Discussed the risks and benefits of not having cardiology here over the weekend.  She understands and wishes to proceed here at [facility] to have surgery with her primary orthopedic surgeon Dr. Aline Brochure.  Pacemaker interrogation has been done. ? ?Physical Exam: ?Vitals:  ? 09/19/21 1900 09/19/21 2249 09/20/21 0320 09/20/21 0604  ?BP: (!) 170/58 (!) 147/57 107/60 (!) 120/59  ?Pulse: 62 67 60 64  ?Resp: 18 18    ?Temp:  98.3 ?F (36.8 ?C) 99.6 ?F (37.6 ?C) 98.4 ?F (36.9 ?C)  ?TempSrc:  Oral  Oral  ?SpO2: 99% 96% 96% 98%  ?Weight:  70.2 kg    ?Height:  '5\' 5"'$  (1.651 m)    ? ?1.  General: ?Patient lying supine in bed,  no acute distress ?  ?2. Psychiatric: ?Alert and oriented x 3, mood and behavior normal for situation, pleasant and cooperative with exam ?  ?3. Neurologic: ?Speech and language are normal, face is symmetric, moves all 4 extremities voluntarily, at baseline without acute deficits on limited exam ?  ?4. HEENMT:  ?Head is atraumatic, normocephalic, pupils reactive to light, neck is supple, trachea  is midline, mucous membranes are moist ?  ?5. Respiratory : ?Lungs are clear to auscultation bilaterally without wheezing, rhonchi, rales, no cyanosis, no increase in work of breathing or accessory muscle use ?  ?6. Cardiovascular : ?Heart rate normal, rhythm is regular, no murmurs, rubs or gallops, no peripheral edema, peripheral pulses palpated ?  ?7. Gastrointestinal:  ?Abdomen is soft, nondistended, nontender to palpation bowel sounds active, no masses or organomegaly palpated ?  ?8. Skin:  ?Skin is warm, dry and intact without rashes, acute lesions, or ulcers on limited exam ?  ?9.Musculoskeletal:  ?Tenderness over right hip, right leg slightly shorter than left. ? ?Data Reviewed: ?In the ED ?Temp 98.1, heart rate 61,  respiratory rate 11, blood pressure 158/60, satting 96% ?CT head shows no acute findings ?Chest x-ray shows no acute findings ?X-ray right hip shows impacted right femoral neck fracture ?No leukocytosis, hemoglobin stable 11.9, hypokalemic at 2.8, creatinine very close to baseline at 1.30 ?Patient was given fentanyl, and 10 mEq of potassium ?Ortho was consulted and plans for surgery tomorrow they requested vitamin K to reverse warfarin, n.p.o. after midnight. ?Assessment and Plan: ?* Closed right hip fracture ([place]) ?- Secondary to mechanical fall ?-N.p.o. after midnight ?-UA for preop assessment ?-Patient is not typed and screened in the ED ?-EKG shows paced rhythm ?-Pain control with Tylenol, Norco, morphine based on pain scale ?-Plan for repair at noon today ? ? ?Hypokalemia ?Potassium 2.8 ?Recently had a stomach virus and since then has had poor p.o. intake ?Patient is also on Lasix at home ?10 mEq potassium given in the ED ?Give 40 more equivalents p.o. ? ?Adult hypothyroidism ?- Continue Synthroid ? ?Type 2 diabetes mellitus ([place]) ?Hold glipizide ?Sliding scale coverage ?Glucose well controlled in the ED 134 ? ?HLD (hyperlipidemia) ?Continue statin ? ?Atrial fibrillation ([place]) ?- Continue flecainide ?-On warfarin with therapeutic INR repeat INR 1.3 today ?-Reversing warfarin with vitamin K for surgery tomorrow per Ortho request ?-Continue to monitor ?-Have spoken to the rep for her pacemaker.  Interrogation done.  40% ventricular paced functioning well.  Recommending magnet during surgery.  Updated Dr. Aline Brochure. ? ?Further recommendations pending on overall hospital course. ? ? Advance Care Planning:   Code Status: Full Code full ? ?Consults: Ortho ? ?Author: ?Delaine Hernandez A, MD ?09/20/2021 10:06 AM ? ?

## 2021-09-20 NOTE — Progress Notes (Signed)
Patient arrived back to the unit post surgery with stable vitals. Patient is alert and oriented. Her dressing to r hip incision is clean dry intact. A mepilex was applied to her sacrum for prevention. SCDS in place. Patient offered ice chips. Tolerated no n/v. Purewick in place. IV fluids infusing. Pain is tolerable at the moment, states she doesn't want any meds at this time. Lights dimmed and call bell within reach. Incentive spirometry education completed. ?

## 2021-09-20 NOTE — Progress Notes (Signed)
ANTICOAGULATION CONSULT NOTE - Initial Consult ? ?Pharmacy Consult for coumadin ?Indication: atrial fibrillation ? ?Allergies  ?Allergen Reactions  ? Fiorinal [Butalbital-Aspirin-Caffeine] Hives  ? Penicillins Rash  ? Tetanus Toxoids Rash  ? ? ?Patient Measurements: ?Height: '5\' 5"'$  (165.1 cm) ?Weight: 70.2 kg (154 lb 12.2 oz) ?IBW/kg (Calculated) : 57 ? ?Vital Signs: ?Temp: 99.8 ?F (37.7 ?C) (04/29 1553) ?Temp Source: Oral (04/29 1553) ?BP: 126/49 (04/29 1553) ?Pulse Rate: 64 (04/29 1553) ? ?Labs: ?Recent Labs  ?  09/19/21 ?1840 09/20/21 ?0636 09/20/21 ?1443  ?HGB 11.9* 11.5*  --   ?HCT 36.5 34.9*  --   ?PLT 131* 114*  --   ?LABPROT 26.9*  --  16.4*  ?INR 2.5*  --  1.3*  ?CREATININE 1.30* 1.27*  --   ? ? ?Estimated Creatinine Clearance: 35.3 mL/min (A) (by C-G formula based on SCr of 1.27 mg/dL (H)). ? ? ?Medical History: ?Past Medical History:  ?Diagnosis Date  ? A-fib ([place])   ? 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 Chronic kidney disease   ? Colon polyps   ? CVA (cerebral vascular accident) [facility])   ? left sided weakness. Has 40% weakness of left arm  ? Depression   ? DM (diabetes mellitus) ([place])   ? GERD (gastroesophageal reflux disease)   ? HTN (hypertension)   ? Hypercholesterolemia   ? Hypothyroidism   ? Pancreatitis   ? biliary  ? S/P colonoscopy 07/2004  ? Dr. Olevia Perches: per e-chart report: diverticulosis, path adenomatous polyps  ? S/P endoscopy 07/2007  ? multiple 3-4 mmsessile gastric polyps, benign, singl antral erosions, negative H.pylori, reactive gastropathy ? NSAIDs  ? Symptomatic bradycardia   ? 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 ? ?Medications:  ?Medications Prior to Admission  ?Medication Sig Dispense Refill Last Dose  ? albuterol (PROVENTIL) (2.5 MG/3ML) 0.083% nebulizer solution Take 2.5 mg by nebulization every 4 (four) hours as needed for wheezing or shortness of breath. Every 4-6 hours as needed   unknown  ? albuterol (VENTOLIN HFA) 108 (90 Base) MCG/ACT inhaler Inhale 2 puffs into the lungs every 6 (six)  hours as needed for wheezing or shortness of breath.   unknown  ? allopurinol (ZYLOPRIM) 100 MG tablet Take 200 mg by mouth daily.   09/19/2021  ? atorvastatin (LIPITOR) 20 MG tablet Take 1 tablet by mouth daily.   09/18/2021  ? calcitRIOL (ROCALTROL) 0.25 MCG capsule Take 0.25-0.5 mcg by mouth See admin instructions. Take 2 tablets by mouth every other day and take 1 tablet daily on the other days   09/19/2021  ? cholecalciferol (VITAMIN D3) 25 MCG (1000 UNIT) tablet Take 2,000 Units by mouth daily.   Past Week  ? citalopram (CELEXA) 20 MG tablet Take 20 mg by mouth daily.   09/19/2021  ? DHA-EPA-Flaxseed Oil-Vitamin E CAPS Take 1 capsule by mouth daily.   09/19/2021  ? flecainide (TAMBOCOR) 100 MG tablet Take 1 tablet (100 mg total) by mouth 2 (two) times daily. 180 tablet 3 09/19/2021  ? furosemide (LASIX) 80 MG tablet Take 2 tablets (160 mg total) by mouth 2 (two) times daily.   09/19/2021  ? gabapentin (NEURONTIN) 100 MG capsule Take 1 capsule (100 mg total) by mouth 3 (three) times daily. 90 capsule 5 09/19/2021  ? glipiZIDE (GLUCOTROL XL) 10 MG 24 hr tablet Take 10 mg by mouth daily.   09/19/2021  ? HYDROcodone-acetaminophen (NORCO) 7.5-325 MG tablet Take 1 tablet by mouth 3 (three) times daily as needed for  severe pain.   09/19/2021  ? levothyroxine (SYNTHROID, LEVOTHROID) 75 MCG tablet Take 75 mcg by mouth daily before breakfast.   09/19/2021  ? vitamin B-12 (CYANOCOBALAMIN) 1000 MCG tablet Take 5,000 mcg by mouth daily.   09/19/2021  ? warfarin (COUMADIN) 5 MG tablet Take 2.5-5 mg by mouth See admin instructions. '5mg'$  Mon, Wed, Fri, Sat; 2.'5mg'$  other days   09/18/2021 at 2330  ? ? ?Assessment: ?81 year old female had a mechanical fall use sustained a right femoral neck fracture presented to the ER and right hip pain with no ability to ambulate. Repair with hip pinning 09/20/21. Pharmacy asked to manage coumadin for patient who is chronically anticoagulated for afib. Coumadin is to restart 09/21/21. Patient did receive Vit.  K '5mg'$  IV 09/19/21 at 2115. INR today prior to surgery was 1.3.  ?Home dose is '5mg'$  on M,W, F , Saturday and 2.'5mg'$  T, Th, Sunday. ? ?Goal of Therapy:  ?INR 2-3 ?Monitor platelets by anticoagulation protocol: Yes ?  ?Plan:  ?Restart Coumadin 4/30 ?PT-INR daily ?CBC 3 times weekly ?Monitor for bleeding ? ?Isac Sarna, BS Pharm D, BCPS ?Clinical Pharmacist ?09/20/2021,4:58 PM ? ? ?

## 2021-09-20 NOTE — Anesthesia Postprocedure Evaluation (Signed)
Anesthesia Post Note ? ?Patient: [name] ? ?Procedure(s) Performed: CANNULATED HIP PINNING (Right: Hip) ? ?Patient location during evaluation: PACU ?Anesthesia Type: Combined General/Spinal ?Level of consciousness: awake and alert and oriented ?Pain management: pain level controlled ?Vital Signs Assessment: post-procedure vital signs reviewed and stable ?Respiratory status: spontaneous breathing, nonlabored ventilation and respiratory function stable ?Cardiovascular status: blood pressure returned to baseline and stable ?Postop Assessment: no apparent nausea or vomiting ?Anesthetic complications: no ? ? ?No notable events documented. ? ? ?Last Vitals:  ?Vitals:  ? 09/20/21 1527 09/20/21 1553  ?BP: (!) 132/52 (!) 126/49  ?Pulse: 68 64  ?Resp: 12   ?Temp: 36.9 ?C 37.7 ?C  ?SpO2: 96% (!) 83%  ?  ?Last Pain:  ?Vitals:  ? 09/20/21 1553  ?TempSrc: Oral  ?PainSc:   ? ? ?  ?  ?  ?  ?  ?  ? ?Jailen Lung C Resa Rinks ? ? ? ? ?

## 2021-09-20 NOTE — Brief Op Note (Signed)
09/19/2021 - 09/20/2021 ? ?2:55 PM ? ?PATIENT:  [name]  81 y.o. female ? ?PRE-OPERATIVE DIAGNOSIS:  fracture right hip, valgus impacted femoral neck ? ?POST-OPERATIVE DIAGNOSIS:  fracture right hip, valgus impacted femoral neck ? ?PROCEDURE:  Procedure(s) with comments: ?CANNULATED HIP PINNING (Right) - ? ?SURGEON:  Surgeon(s) and Role: ?   Carole Civil, MD - Primary ? ?Implants 3 fully threaded 6.5 cannulated titanium screws from [employer] with washers ?Size 80, 85 and 90 mm ? ?PHYSICIAN ASSISTANT:  ? ?ASSISTANTS: none  ? ?ANESTHESIA:   general ? ?EBL:  150 mL  ? ?BLOOD ADMINISTERED:none ? ?DRAINS: none  ? ?LOCAL MEDICATIONS USED:  MARCAINE    ? ?SPECIMEN:  No Specimen ? ?DISPOSITION OF SPECIMEN:  N/A ? ?COUNTS:  YES ? ?TOURNIQUET:  * No tourniquets in log * ? ?DICTATION: .Dragon Dictation ? ?PLAN OF CARE: Admit to inpatient  ? ?PATIENT DISPOSITION:  PACU - hemodynamically stable. ?  ?Delay start of Pharmacological VTE agent (>24hrs) due to surgical blood loss or risk of bleeding: no ? ?

## 2021-09-21 DIAGNOSIS — E1122 Type 2 diabetes mellitus with diabetic chronic kidney disease: Secondary | ICD-10-CM | POA: Diagnosis not present

## 2021-09-21 DIAGNOSIS — N1831 Chronic kidney disease, stage 3a: Secondary | ICD-10-CM | POA: Diagnosis not present

## 2021-09-21 DIAGNOSIS — I129 Hypertensive chronic kidney disease with stage 1 through stage 4 chronic kidney disease, or unspecified chronic kidney disease: Secondary | ICD-10-CM | POA: Diagnosis not present

## 2021-09-21 DIAGNOSIS — S72001A Fracture of unspecified part of neck of right femur, initial encounter for closed fracture: Secondary | ICD-10-CM | POA: Diagnosis not present

## 2021-09-21 DIAGNOSIS — E782 Mixed hyperlipidemia: Secondary | ICD-10-CM | POA: Diagnosis not present

## 2021-09-21 LAB — CBC
HCT: 36.2 % (ref 36.0–46.0)
Hemoglobin: 11.6 g/dL — ABNORMAL LOW (ref 12.0–15.0)
MCH: 32.5 pg (ref 26.0–34.0)
MCHC: 32 g/dL (ref 30.0–36.0)
MCV: 101.4 fL — ABNORMAL HIGH (ref 80.0–100.0)
Platelets: 89 10*3/uL — ABNORMAL LOW (ref 150–400)
RBC: 3.57 MIL/uL — ABNORMAL LOW (ref 3.87–5.11)
RDW: 14.4 % (ref 11.5–15.5)
WBC: 7.8 10*3/uL (ref 4.0–10.5)
nRBC: 0 % (ref 0.0–0.2)

## 2021-09-21 LAB — BASIC METABOLIC PANEL
Anion gap: 8 (ref 5–15)
BUN: 26 mg/dL — ABNORMAL HIGH (ref 8–23)
CO2: 30 mmol/L (ref 22–32)
Calcium: 8.4 mg/dL — ABNORMAL LOW (ref 8.9–10.3)
Chloride: 102 mmol/L (ref 98–111)
Creatinine, Ser: 1.24 mg/dL — ABNORMAL HIGH (ref 0.44–1.00)
GFR, Estimated: 44 mL/min — ABNORMAL LOW (ref >60)
Glucose, Bld: 200 mg/dL — ABNORMAL HIGH (ref 70–99)
Potassium: 3.5 mmol/L (ref 3.5–5.1)
Sodium: 140 mmol/L (ref 135–145)

## 2021-09-21 LAB — GLUCOSE, CAPILLARY
Glucose-Capillary: 187 mg/dL — ABNORMAL HIGH (ref 70–99)
Glucose-Capillary: 191 mg/dL — ABNORMAL HIGH (ref 70–99)
Glucose-Capillary: 200 mg/dL — ABNORMAL HIGH (ref 70–99)
Glucose-Capillary: 216 mg/dL — ABNORMAL HIGH (ref 70–99)
Glucose-Capillary: 251 mg/dL — ABNORMAL HIGH (ref 70–99)

## 2021-09-21 LAB — PROTIME-INR
INR: 1 (ref 0.8–1.2)
Prothrombin Time: 13.5 seconds (ref 11.4–15.2)

## 2021-09-21 MED ORDER — WARFARIN - PHARMACIST DOSING INPATIENT: Freq: Every day | Status: DC

## 2021-09-21 MED ORDER — ADULT MULTIVITAMIN W/MINERALS CH
1.0000 | ORAL_TABLET | Freq: Every day | ORAL | Status: DC
  Administered 2021-09-21 – 2021-09-23 (×3): 1 via ORAL
  Filled 2021-09-21 (×3): qty 1

## 2021-09-21 MED ORDER — WARFARIN SODIUM 5 MG PO TABS
5.0000 mg | ORAL_TABLET | Freq: Once | ORAL | Status: AC
  Administered 2021-09-21: 5 mg via ORAL
  Filled 2021-09-21: qty 1

## 2021-09-21 NOTE — Discharge Instructions (Signed)
[place] Hospital Stay ?Proper nutrition can help your body recover from illness and injury.   ?Foods and beverages high in protein, vitamins, and minerals help rebuild muscle loss, promote healing, & reduce fall risk.  ? ?In addition to eating healthy foods, a nutrition shake is an easy, delicious way to get the nutrition you need during and after your hospital stay ? ?It is recommended that you continue to drink 2 bottles per day of:       Ensure Plus or Glucerna for at least 1 month (30 days) after your hospital stay  ? ?Tips for adding a nutrition shake into your routine: ?As allowed, drink one with vitamins or medications instead of water or juice ?Enjoy one as a tasty mid-morning or afternoon snack ?Drink cold or make a milkshake out of it ?Drink one instead of milk with cereal or snacks ?Use as a coffee creamer ?  ?Available at the following grocery stores and pharmacies:           ?* [place]  ?* [facility]          * [place]  ?* [facility]      * [employer]  * [employer]   ?* [facility]  * [employer]   ?[facility] [phone]  ?          ?For COUPONS visit: [email or web address] or [email or web address]  ? ?Suggested Substitutions ?Ensure Plus = Boost Plus = Carnation Breakfast Essentials = Boost Compact ?Ensure Active Clear = Boost Breeze ?Glucerna Shake = Boost Glucose Control = Carnation Breakfast Essentials SUGAR FREE ? ?  ? ?

## 2021-09-21 NOTE — Evaluation (Signed)
Physical Therapy Evaluation Patient Details Name: [name] MRN: [id number] DOB: 21-Jan-1941 Today's Date: 09/21/2021  History of Present Illness  [name] is a 81 y.o. female with medical history significant of atrial fibrillation, chronic kidney disease, CVA several decades ago, diabetes mellitus type 2, GERD, hypertension, hyperlipidemia, hypothyroidism, and more presents ED with a chief complaint of fall.  Patient reports that she had 2 falls this week.  1 was after church on Sunday, she was walking in a straight line, turned her head to talk to somebody and ended up twirling until she almost fell down.  She does not describe it as being dizzy.  She describes it as when she turns her head she just keeps turning.  She does not feel lightheaded when this happens either.  Encouraged somebody caught her.  Today, she was walking down [address] and turned her head to talk to somebody and the same thing happened.  She felt like she was twirling in a circle and she fell down landing directly on her right hip.  Patient reports she also hit her head on the right side.  She has no tenderness over her skull, she did not blackout, she has no change in vision, no change in hearing.  Patient reports that her pain is 10 out of 10 when she tries to move her right leg.  It is a sharp grabbing pain.  At rest her pain is a 5 out of 10, and more like a dull ache.  The pain radiates toward her groin and the inside of her leg.  She has no numbness.     On review of systems patient does report that she had a stomach virus 2 weeks ago.  She had nausea, vomiting, diarrhea.  Since then she has had a decreased appetite.  Her potassium is low today at, which is likely partially due to the stomach virus, partially due to low appetite, partially due to Lasix daily.     Patient has no other complaints at this time.    Clinical Impression  Patient s/p Right hip hemiarthroplasty per Dr. Romeo Apple 09/21/21. She is WBAT.   Patient is sitting up in a chair on therapist arrival.  She is able to perform sit to stand to RW with moderate assistance for the initial portion of the transfer and then needs minimal assistance for standing balance and ambulation in the hallway x 40 ft with RW.  She demonstrates decreased stance phase Right lower extremity; antalgic gait and slower than normal gait speed.  Patient will benefit from skilled therapy interventions during her hospital stay and at the next recommended venue to address deficits and promote return to optimal functional mobility.    Recommendations for follow up therapy are one component of a multi-disciplinary discharge planning process, led by the attending physician.  Recommendations may be updated based on patient status, additional functional criteria and insurance authorization.  Follow Up Recommendations Skilled nursing-short term rehab (<3 hours/day)    Assistance Recommended at Discharge Frequent or constant Supervision/Assistance  Patient can return home with the following  A lot of help with walking and/or transfers;A lot of help with bathing/dressing/bathroom;Help with stairs or ramp for entrance    Equipment Recommendations Rolling walker (2 wheels)  Recommendations for Other Services       Functional Status Assessment Patient has had a recent decline in their functional status and demonstrates the ability to make significant improvements in function in a reasonable and predictable amount of time.  Precautions / Restrictions Precautions Precautions: Fall Restrictions Weight Bearing Restrictions: Yes RLE Weight Bearing: Weight bearing as tolerated      Mobility  Bed Mobility                 Patient Response: Cooperative  Transfers Overall transfer level: Needs assistance Equipment used: Rolling walker (2 wheels) Transfers: Sit to/from Stand Sit to Stand: Mod assist           General transfer comment: mod assist for the first  half of the sit to stand transfer    Ambulation/Gait Ambulation/Gait assistance: Min assist Gait Distance (Feet): 40 Feet Assistive device: Rolling walker (2 wheels) Gait Pattern/deviations: Decreased stance time - right, Antalgic          Stairs            Wheelchair Mobility    Modified Rankin (Stroke Patients Only)       Balance Overall balance assessment: Needs assistance Sitting-balance support: Feet supported, Bilateral upper extremity supported Sitting balance-Leahy Scale: Good     Standing balance support: During functional activity, Reliant on assistive device for balance, Bilateral upper extremity supported Standing balance-Leahy Scale: Good Standing balance comment: good standing balance with therapist CGA to min A and RW                             Pertinent Vitals/Pain Pain Assessment Pain Assessment: 0-10 Pain Score: 6  Pain Location: Right hip/ groin area Pain Descriptors / Indicators: Sore, Tender Pain Intervention(s): Limited activity within patient's tolerance, Premedicated before session    Home Living Family/patient expects to be discharged to:: Private residence Living Arrangements: Alone Available Help at Discharge: Family;Friend(s);Available PRN/intermittently Type of Home: Mobile home Home Access: Stairs to enter Entrance Stairs-Rails: Can reach both;Left;Right Entrance Stairs-Number of Steps: 5   Home Layout: One level Home Equipment: Toilet riser;Cane - single point;Shower seat Additional Comments: needs RW    Prior Function Prior Level of Function : Independent/Modified Independent                     Hand Dominance   Dominant Hand: Right    Extremity/Trunk Assessment   Upper Extremity Assessment Upper Extremity Assessment: Generalized weakness;LUE deficits/detail LUE Deficits / Details: hx of weakness due to old CVA    Lower Extremity Assessment Lower Extremity Assessment: RLE deficits/detail;LLE  deficits/detail RLE Deficits / Details: s/p R hip 09/20/21 LLE Deficits / Details: hx of weakness due to old CVA    Cervical / Trunk Assessment Cervical / Trunk Assessment: Normal  Communication   Communication: No difficulties  Cognition Arousal/Alertness: Awake/alert Behavior During Therapy: WFL for tasks assessed/performed Overall Cognitive Status: Within Functional Limits for tasks assessed                                          General Comments      Exercises     Assessment/Plan    PT Assessment Patient needs continued PT services  PT Problem List Decreased strength;Decreased range of motion;Decreased activity tolerance;Decreased safety awareness;Decreased balance;Decreased mobility;Pain       PT Treatment Interventions DME instruction;Balance training;Gait training;Neuromuscular re-education;Stair training;Functional mobility training;Patient/family education;Therapeutic activities;Therapeutic exercise    PT Goals (Current goals can be found in the Care Plan section)  Acute Rehab PT Goals Patient Stated Goal: return home PT Goal Formulation: With  patient Time For Goal Achievement: 09/29/21 Potential to Achieve Goals: Good    Frequency Min 1X/week     Co-evaluation               AM-PAC PT "6 Clicks" Mobility  Outcome Measure Help needed turning from your back to your side while in a flat bed without using bedrails?: A Little Help needed moving from lying on your back to sitting on the side of a flat bed without using bedrails?: A Little Help needed moving to and from a bed to a chair (including a wheelchair)?: A Lot Help needed standing up from a chair using your arms (e.g., wheelchair or bedside chair)?: A Lot Help needed to walk in hospital room?: A Little Help needed climbing 3-5 steps with a railing? : A Lot 6 Click Score: 15    End of Session Equipment Utilized During Treatment: Gait belt Activity Tolerance: Patient tolerated  treatment well Patient left: in chair;with call bell/phone within reach Nurse Communication: Mobility status PT Visit Diagnosis: Unsteadiness on feet (R26.81);Other abnormalities of gait and mobility (R26.89);History of falling (Z91.81);Muscle weakness (generalized) (M62.81)    Time: 1610-9604 PT Time Calculation (min) (ACUTE ONLY): 24 min   Charges:   PT Evaluation $PT Eval Low Complexity: 1 Low PT Treatments $Gait Training: 8-22 mins        10:15 AM, 09/21/21 Wynter Isaacs Small Brailyn Delman MPT [facility] [place] [phone] Ph:[phone]

## 2021-09-21 NOTE — Progress Notes (Addendum)
Initial Nutrition Assessment ? ?DOCUMENTATION CODES:  ? ?Not applicable ? ?INTERVENTION:  ? ?Ensure Enlive po BID, each supplement provides 350 kcal and 20 grams of protein. ? ?MVI with minerals daily. ? ?NUTRITION DIAGNOSIS:  ? ?Increased nutrient needs related to hip fracture as evidenced by estimated needs. ? ?GOAL:  ? ?Patient will meet greater than or equal to 90% of their needs ? ?MONITOR:  ? ?PO intake, Supplement acceptance, Labs ? ?REASON FOR ASSESSMENT:  ? ?Malnutrition Screening Tool ?  ? ?ASSESSMENT:  ? ?81 yo female admitted with R hip fracture s/p mechanical fall at home. PMH includes A fib, pacemaker, HTN, CKD, CVA, DM-2, GERD, HTN, HLD, hypothyroidism, biliary pancreatitis. ? ?4/29 - S/P surgical hip repair ?RD working remotely. ?Spoke with patient on the phone. ?Patient reports poor intake since having a stomach virus a few weeks ago. Her appetite has not returned to normal.   ? ?Weight history reviewed. ?Patient has had 9% weight loss within the past 6 months. Patient suspects most of this has occurred over the past few weeks. She agreed to try Ensure supplements to increase intake.  ? ?Currently on a heart healthy diet. ?Meal intakes: 50% x 1 meal 4/29. ? ?Labs reviewed. A1C 6.3 ?CBG: 200 this AM ? ?Medications reviewed and include calcitriol, colace, lasix, novolog, vitamin B-12. ?IVF: NS at 125 ml/h ? ?Patient is at increased nutrition risk given recent poor intake and weight loss with currently increased nutrient needs for healing.  ? ?NUTRITION - FOCUSED PHYSICAL EXAM: ? ?Unable to complete ? ?Diet Order:   ?Diet Order   ? ?       ?  Diet Heart Room service appropriate? Yes; Fluid consistency: Thin  Diet effective now       ?  ? ?  ?  ? ?  ? ? ?EDUCATION NEEDS:  ? ?Not appropriate for education at this time ? ?Skin:  Skin Assessment: Reviewed RN Assessment ? ?Last BM:  4/28 ? ?Height:  ? ?Ht Readings from Last 1 Encounters:  ?09/19/21 '5\' 5"'$  (1.651 m)  ? ? ?Weight:  ? ?Wt Readings from Last 1  Encounters:  ?09/19/21 70.2 kg  ? ? ? ?BMI:  Body mass index is 25.75 kg/m?. ? ?Estimated Nutritional Needs:  ? ?Kcal:  1800-2000 ? ?Protein:  90-100 gm ? ?Fluid:  1.8-2 L ? ? ? ?Lucas Mallow RD, LDN, CNSC ?Please refer to Amion for contact information.                                                       ? ?

## 2021-09-21 NOTE — Progress Notes (Signed)
Subjective: ?1 Day Post-Op Procedure(s) (LRB): ?CANNULATED HIP PINNING (Right) ?Patient reports pain as mild.   ? ?Objective: ?Vital signs in last 24 hours: ?Temp:  [97.6 ?F (36.4 ?C)-99.8 ?F (37.7 ?C)] 97.6 ?F (36.4 ?C) (04/30 0445) ?Pulse Rate:  [60-68] 60 (04/30 0445) ?Resp:  [12-17] 17 (04/30 0445) ?BP: (117-161)/(49-71) 150/71 (04/30 0445) ?SpO2:  [83 %-97 %] 96 % (04/30 0445) ? ?Intake/Output from previous day: ?04/29 0701 - 04/30 0700 ?In: 3067.9 [P.O.:480; I.V.:2137.9; IV Piggyback:450] ?Out: 750 [Urine:600; Blood:150] ?Intake/Output this shift: ?Total I/O ?In: 240 [P.O.:240] ?Out: -  ? ?Recent Labs  ?  09/19/21 ?1840 09/20/21 ?0636 09/21/21 ?0429  ?HGB 11.9* 11.5* 11.6*  ? ?Recent Labs  ?  09/20/21 ?0636 09/21/21 ?0429  ?WBC 8.5 7.8  ?RBC 3.54* 3.57*  ?HCT 34.9* 36.2  ?PLT 114* 89*  ? ?Recent Labs  ?  09/20/21 ?0636 09/21/21 ?5697  ?NA 143 140  ?K 4.1 3.5  ?CL 102 102  ?CO2 34* 30  ?BUN 24* 26*  ?CREATININE 1.27* 1.24*  ?GLUCOSE 164* 200*  ?CALCIUM 8.9 8.4*  ? ?Recent Labs  ?  09/20/21 ?0814 09/21/21 ?0429  ?INR 1.3* 1.0  ? ? ?Neurologically intact ?Neurovascular intact ?Sensation intact distally ?Intact pulses distally ?Dorsiflexion/Plantar flexion intact ? ? ?Assessment/Plan: ?1 Day Post-Op Procedure(s) (LRB): ?CANNULATED HIP PINNING (Right) ?Up with therapy ?D/C IV fluids ?Discharge to SNF ? ?Postop plan ?Weightbearing status as tolerated ?Staples to come out postop day 14 ?Images can be performed at 2 weeks, 6 weeks and 12 weeks ?The patient can resume her warfarin as DVT prophylaxis ? ?First postop visit postop day 13 ? ? ?Arther Abbott ?09/21/2021, 10:14 AM ? ?

## 2021-09-21 NOTE — Progress Notes (Signed)
ANTICOAGULATION CONSULT NOTE -  ? ?Pharmacy Consult for coumadin ?Indication: atrial fibrillation ? ?Allergies  ?Allergen Reactions  ? Fiorinal [Butalbital-Aspirin-Caffeine] Hives  ? Penicillins Rash  ? Tetanus Toxoids Rash  ? ? ?Patient Measurements: ?Height: '5\' 5"'$  (165.1 cm) ?Weight: 70.2 kg (154 lb 12.2 oz) ?IBW/kg (Calculated) : 57 ? ?Vital Signs: ?Temp: 97.6 ?F (36.4 ?C) (04/30 0445) ?BP: 150/71 (04/30 0445) ?Pulse Rate: 60 (04/30 0445) ? ?Labs: ?Recent Labs  ?  09/19/21 ?1840 09/20/21 ?0636 09/20/21 ?2878 09/21/21 ?6767 09/21/21 ?2094  ?HGB 11.9* 11.5*  --  11.6*  --   ?HCT 36.5 34.9*  --  36.2  --   ?PLT 131* 114*  --  89*  --   ?LABPROT 26.9*  --  16.4* 13.5  --   ?INR 2.5*  --  1.3* 1.0  --   ?CREATININE 1.30* 1.27*  --   --  1.24*  ? ? ? ?Estimated Creatinine Clearance: 36.2 mL/min (A) (by C-G formula based on SCr of 1.24 mg/dL (H)). ? ? ?Medical History: ?Past Medical History:  ?Diagnosis Date  ? A-fib ([place])   ? 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 Chronic kidney disease   ? Colon polyps   ? CVA (cerebral vascular accident) [facility])   ? left sided weakness. Has 40% weakness of left arm  ? Depression   ? DM (diabetes mellitus) ([place])   ? GERD (gastroesophageal reflux disease)   ? HTN (hypertension)   ? Hypercholesterolemia   ? Hypothyroidism   ? Pancreatitis   ? biliary  ? S/P colonoscopy 07/2004  ? Dr. Olevia Perches: per e-chart report: diverticulosis, path adenomatous polyps  ? S/P endoscopy 07/2007  ? multiple 3-4 mmsessile gastric polyps, benign, singl antral erosions, negative H.pylori, reactive gastropathy ? NSAIDs  ? Symptomatic bradycardia   ? 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 ? ?Medications:  ?Medications Prior to Admission  ?Medication Sig Dispense Refill Last Dose  ? albuterol (PROVENTIL) (2.5 MG/3ML) 0.083% nebulizer solution Take 2.5 mg by nebulization every 4 (four) hours as needed for wheezing or shortness of breath. Every 4-6 hours as needed   unknown  ? albuterol (VENTOLIN HFA) 108 (90 Base) MCG/ACT  inhaler Inhale 2 puffs into the lungs every 6 (six) hours as needed for wheezing or shortness of breath.   unknown  ? allopurinol (ZYLOPRIM) 100 MG tablet Take 200 mg by mouth daily.   09/19/2021  ? atorvastatin (LIPITOR) 20 MG tablet Take 1 tablet by mouth daily.   09/18/2021  ? calcitRIOL (ROCALTROL) 0.25 MCG capsule Take 0.25-0.5 mcg by mouth See admin instructions. Take 2 tablets by mouth every other day and take 1 tablet daily on the other days   09/19/2021  ? cholecalciferol (VITAMIN D3) 25 MCG (1000 UNIT) tablet Take 2,000 Units by mouth daily.   Past Week  ? citalopram (CELEXA) 20 MG tablet Take 20 mg by mouth daily.   09/19/2021  ? DHA-EPA-Flaxseed Oil-Vitamin E CAPS Take 1 capsule by mouth daily.   09/19/2021  ? flecainide (TAMBOCOR) 100 MG tablet Take 1 tablet (100 mg total) by mouth 2 (two) times daily. 180 tablet 3 09/19/2021  ? furosemide (LASIX) 80 MG tablet Take 2 tablets (160 mg total) by mouth 2 (two) times daily.   09/19/2021  ? gabapentin (NEURONTIN) 100 MG capsule Take 1 capsule (100 mg total) by mouth 3 (three) times daily. 90 capsule 5 09/19/2021  ? glipiZIDE (GLUCOTROL XL) 10 MG 24 hr tablet Take 10 mg by mouth  daily.   09/19/2021  ? HYDROcodone-acetaminophen (NORCO) 7.5-325 MG tablet Take 1 tablet by mouth 3 (three) times daily as needed for severe pain.   09/19/2021  ? levothyroxine (SYNTHROID, LEVOTHROID) 75 MCG tablet Take 75 mcg by mouth daily before breakfast.   09/19/2021  ? vitamin B-12 (CYANOCOBALAMIN) 1000 MCG tablet Take 5,000 mcg by mouth daily.   09/19/2021  ? warfarin (COUMADIN) 5 MG tablet Take 2.5-5 mg by mouth See admin instructions. '5mg'$  Mon, Wed, Fri, Sat; 2.'5mg'$  other days   09/18/2021 at 2330  ? ? ?Assessment: ?81 year old female had a mechanical fall use sustained a right femoral neck fracture presented to the ER and right hip pain with no ability to ambulate. Repair with hip pinning 09/20/21. Pharmacy asked to manage coumadin for patient who is chronically anticoagulated for afib.  Coumadin is to restart 09/21/21. Patient did receive Vit. K '5mg'$  IV 09/19/21 at 2115. INR today prior to surgery was 1.3.  ?Home dose is '5mg'$  on M,W, F , Saturday and 2.'5mg'$  T, Th, Sunday. ? ?INR 1, will restart Coumadin today ? ?Goal of Therapy:  ?INR 2-3 ?Monitor platelets by anticoagulation protocol: Yes ?  ?Plan:  ?Coumadin '5mg'$  po x 1 today ?PT-INR daily ?CBC 3 times weekly ?Monitor for bleeding ? ?Isac Sarna, BS Pharm D, BCPS ?Clinical Pharmacist ?09/21/2021,11:14 AM ? ? ?

## 2021-09-21 NOTE — Plan of Care (Signed)
?  Problem: Acute Rehab PT Goals(only PT should resolve) ?Goal: Pt Will Go Supine/Side To Sit ?Outcome: Progressing ?Flowsheets (Taken 09/21/2021 1015) ?Pt will go Supine/Side to Sit: with minimal assist ?Goal: Patient Will Transfer Sit To/From Stand ?Outcome: Progressing ?Flowsheets (Taken 09/21/2021 1015) ?Patient will transfer sit to/from stand: with minimal assist ?Goal: Pt Will Transfer Bed To Chair/Chair To Bed ?Outcome: Progressing ?Flowsheets (Taken 09/21/2021 1015) ?Pt will Transfer Bed to Chair/Chair to Bed: with min assist ?Goal: Pt Will Ambulate ?Outcome: Progressing ?Flowsheets (Taken 09/21/2021 1015) ?Pt will Ambulate: ? 50 feet ? with min guard assist ? with rolling walker ?Goal: Pt Will Go Up/Down Stairs ?Outcome: Progressing ?Flowsheets (Taken 09/21/2021 1015) ?Pt will Go Up / Down Stairs: ? 3-5 stairs ? with minimal assist ? with rail(s) ?  ?

## 2021-09-21 NOTE — Progress Notes (Signed)
? ?Patient: [name] TDV:[id number] DOB: [birth year] ?DOA: 09/19/2021 ?DOS: the patient was seen and examined on 09/21/2021 ?PCP: Celene Squibb, MD  ?Patient coming from: Home ? ?Chief Complaint:  ?Chief Complaint  ?Patient presents with  ? Fall  ? ?HPI: [name] is a 81 y.o. female with medical history significant of atrial fibrillation, chronic kidney disease, CVA several decades ago, diabetes mellitus type 2, GERD, hypertension, hyperlipidemia, hypothyroidism, and more presents ED with a chief complaint of fall.  Patient reports that she had 2 falls this week.  1 was after church on Sunday, she was walking in a straight line, turned her head to talk to somebody and ended up twirling until she almost fell down.  She does not describe it as being dizzy.  She describes it as when she turns her head she just keeps turning.  She does not feel lightheaded when this happens either.  Encouraged somebody caught her.  Today, she was walking down [address] and turned her head to talk to somebody and the same thing happened.  She felt like she was twirling in a circle and she fell down landing directly on her right hip.  Patient reports she also hit her head on the right side.  She has no tenderness over her skull, she did not blackout, she has no change in vision, no change in hearing.  Patient reports that her pain is 10 out of 10 when she tries to move her right leg.  It is a sharp grabbing pain.  At rest her pain is a 5 out of 10, and more like a dull ache.  The pain radiates toward her groin and the inside of her leg.  She has no numbness. ? ?On review of systems patient does report that she had a stomach virus 2 weeks ago.  She had nausea, vomiting, diarrhea.  Since then she has had a decreased appetite.  Her potassium is low today at, which is likely partially due to the stomach virus, partially due to low appetite, partially due to Lasix daily. ? ?Patient has no other complaints at this time. ? ?Patient  does not smoke, does not drink, does not use illicit drugs.  She is vaccinated for COVID.  Patient is full code. ? ?Subjective ?Up in chair doing good ? ?Physical Exam: ?Vitals:  ? 09/20/21 1553 09/20/21 1745 09/20/21 2206 09/21/21 0445  ?BP: (!) 126/49 (!) 121/54 (!) 117/57 (!) 150/71  ?Pulse: 64 62 63 60  ?Resp:   17 17  ?Temp: 99.8 ?F (37.7 ?C) 98.7 ?F (37.1 ?C) 98.1 ?F (36.7 ?C) 97.6 ?F (36.4 ?C)  ?TempSrc: Oral Oral    ?SpO2: (!) 83% 91% 94% 96%  ?Weight:      ?Height:      ? ?1.  General: ?Patient lying supine in bed,  no acute distress ?  ?2. Psychiatric: ?Alert and oriented x 3, mood and behavior normal for situation, pleasant and cooperative with exam ?  ?3. Neurologic: ?Speech and language are normal, face is symmetric, moves all 4 extremities voluntarily, at baseline without acute deficits on limited exam ?  ?4. HEENMT:  ?Head is atraumatic, normocephalic, pupils reactive to light, neck is supple, trachea is midline, mucous membranes are moist ?  ?5. Respiratory : ?Lungs are clear to auscultation bilaterally without wheezing, rhonchi, rales, no cyanosis, no increase in work of breathing or accessory muscle use ?  ?6. Cardiovascular : ?Heart rate normal, rhythm is regular, no murmurs, rubs  or gallops, no peripheral edema, peripheral pulses palpated ?  ?7. Gastrointestinal:  ?Abdomen is soft, nondistended, nontender to palpation bowel sounds active, no masses or organomegaly palpated ?  ?8. Skin:  ?Skin is warm, dry and intact without rashes, acute lesions, or ulcers on limited exam ?  ?9.Musculoskeletal:  ?Soreness to right hip as expected ? ?Data Reviewed: ?In the ED ?Temp 98.1, heart rate 61, respiratory rate 11, blood pressure 158/60, satting 96% ?CT head shows no acute findings ?Chest x-ray shows no acute findings ?X-ray right hip shows impacted right femoral neck fracture ?No leukocytosis, hemoglobin stable 11.9, hypokalemic at 2.8, creatinine very close to baseline at 1.30 ?Patient was given  fentanyl, and 10 mEq of potassium ?Ortho was consulted and plans for surgery tomorrow they requested vitamin K to reverse warfarin, n.p.o. after midnight. ?Assessment and Plan: ?* Closed right hip fracture ([place]) ?- Secondary to mechanical fall ?-Postop day 1 ?--EKG shows paced rhythm ?-Pain control with Tylenol, Norco, morphine based on pain scale ?-Physical therapy evaluation, will likely need short-term rehab ? ?Hypokalemia ?Potassium 2.8 ?Recently had a stomach virus and since then has had poor p.o. intake ?Patient is also on Lasix at home ?10 mEq potassium given in the ED ?Give 40 more equivalents p.o. ? ?Adult hypothyroidism ?- Continue Synthroid ? ?Type 2 diabetes mellitus ([place]) ?Hold glipizide ?Sliding scale coverage ?Glucose well controlled in the ED 134 ? ?HLD (hyperlipidemia) ?Continue statin ? ?Atrial fibrillation ([place]) ?- Continue flecainide ?-Status post reversal of INR with vitamin K ?-Have spoken to the rep for her pacemaker.  Interrogation done.  40% ventricular paced functioning well.  Recommending magnet during surgery.  Updated Dr. Aline Brochure. ?-Resume Coumadin 09/21/2021 ? ?Further recommendations pending on overall hospital course. ? ? Advance Care Planning:   Code Status: Full Code full ? ?Consults: Ortho ? ?Author: ?Phillips Grout, MD ?09/21/2021 9:19 AM ? ?

## 2021-09-22 DIAGNOSIS — E782 Mixed hyperlipidemia: Secondary | ICD-10-CM | POA: Diagnosis not present

## 2021-09-22 DIAGNOSIS — S72001A Fracture of unspecified part of neck of right femur, initial encounter for closed fracture: Secondary | ICD-10-CM | POA: Diagnosis not present

## 2021-09-22 DIAGNOSIS — E039 Hypothyroidism, unspecified: Secondary | ICD-10-CM | POA: Diagnosis not present

## 2021-09-22 DIAGNOSIS — I480 Paroxysmal atrial fibrillation: Secondary | ICD-10-CM | POA: Diagnosis not present

## 2021-09-22 LAB — CBC
HCT: 33 % — ABNORMAL LOW (ref 36.0–46.0)
Hemoglobin: 10.3 g/dL — ABNORMAL LOW (ref 12.0–15.0)
MCH: 31.2 pg (ref 26.0–34.0)
MCHC: 31.2 g/dL (ref 30.0–36.0)
MCV: 100 fL (ref 80.0–100.0)
Platelets: 98 10*3/uL — ABNORMAL LOW (ref 150–400)
RBC: 3.3 MIL/uL — ABNORMAL LOW (ref 3.87–5.11)
RDW: 14.5 % (ref 11.5–15.5)
WBC: 8.2 10*3/uL (ref 4.0–10.5)
nRBC: 0 % (ref 0.0–0.2)

## 2021-09-22 LAB — PROTIME-INR
INR: 1.1 (ref 0.8–1.2)
Prothrombin Time: 13.8 seconds (ref 11.4–15.2)

## 2021-09-22 LAB — GLUCOSE, CAPILLARY
Glucose-Capillary: 138 mg/dL — ABNORMAL HIGH (ref 70–99)
Glucose-Capillary: 235 mg/dL — ABNORMAL HIGH (ref 70–99)
Glucose-Capillary: 172 mg/dL — ABNORMAL HIGH (ref 70–99)
Glucose-Capillary: 181 mg/dL — ABNORMAL HIGH (ref 70–99)

## 2021-09-22 MED ORDER — ACETAMINOPHEN 325 MG PO TABS: 325.0000 mg | ORAL_TABLET | Freq: Four times a day (QID) | ORAL | 0 refills | Status: DC | PRN

## 2021-09-22 MED ORDER — MUPIROCIN 2 % EX OINT
1.0000 | TOPICAL_OINTMENT | Freq: Two times a day (BID) | CUTANEOUS | 0 refills | Status: DC
Start: 2021-09-22 — End: 2021-09-24

## 2021-09-22 MED ORDER — DOCUSATE SODIUM 100 MG PO CAPS: 100.0000 mg | ORAL_CAPSULE | Freq: Two times a day (BID) | ORAL | 0 refills | Status: DC

## 2021-09-22 MED ORDER — MENTHOL 3 MG MT LOZG: 1.0000 | LOZENGE | OROMUCOSAL | 12 refills | Status: DC | PRN

## 2021-09-22 MED ORDER — TRAMADOL HCL 50 MG PO TABS: 50.0000 mg | ORAL_TABLET | Freq: Four times a day (QID) | ORAL | 0 refills | Status: DC

## 2021-09-22 NOTE — Progress Notes (Signed)
Transition of Care (TOC) -30 day Note   ?  ?  ?Patient Details  ?Name: [name] ?Date of Birth: July 17, 1940 ?  ?Transition of Care (TOC) CM/SW Contact  ?Name: Shade Flood ?Phone Number: [phone] ?Date: 09/22/2021 ?Time: 1036 ? ?  ?To Whom it May Concern: ?  ?Please be advised that the above patient will require a short-term nursing home stay, anticipated 30 days or less rehabilitation and strengthening. The plan is for return home.  ? ?

## 2021-09-22 NOTE — Discharge Summary (Signed)
?Physician Discharge Summary ?  ?Patient: [name] MRN: [id number] DOB: 1941/03/20  ?Admit date:     09/19/2021  ?Discharge date: 09/22/21  ?Discharge Physician: Valeria Batman Loriana Samad  ? ?PCP: Celene Squibb, MD  ? ?Recommendations at discharge:  ? ?Follow-up with orthopedic Dr. Dorothyann Peng [place]:Weightbearing status as tolerated ?Staples to come out postop day 14 ?Images can be performed at 2 weeks, 6 weeks and 12 weeks ?The patient can resume her warfarin as DVT prophylaxis ?First postop visit postop day 13 ?Follow-up with PCP 2-3 weeks ? ?Discharge Diagnoses: ?Principal Problem: ?  Closed fracture of neck of right femur ([place]) ?Active Problems: ?  Atrial fibrillation ([place]) ?  HLD (hyperlipidemia) ?  Type 2 diabetes mellitus ([place]) ?  Adult hypothyroidism ?  Hypokalemia ? ?Resolved Problems: ?  * No resolved hospital problems. * ? ?Hospital Course: ?No notes on file ? ?Assessment and Plan: ?* Closed fracture of neck of right femur ([place]) ?- Secondary to mechanical fall ?-Status post ORIF, postop day #2 ?-UA for preop assessment-clear ?-EKG shows paced rhythm ?-Pain control with Tylenol, Norco, morphine based on pain scale ? ? ?Hypokalemia ?Potassium was  2.8 ?Status follow-up with CMP, and replacement with p.o. KCl, ? ?Adult hypothyroidism ?- Continue Synthroid ? ?Type 2 diabetes mellitus ([place]) ?Resume glipizide ?-Check CBG QA CHS as needed, with SSI coverage ?-Diabetic diet ? ?HLD (hyperlipidemia) ?Continue statin ? ?Atrial fibrillation ([place]) ?- Resume flecainide ?-On warfarin with therapeutic INR ?-Reversing warfarin with vitamin K for surgery  ?Resume Coumadin now with INR goal 2-3 ? ? ? ? ? ?  ? ?Pain control - [employer] Controlled Substance Reporting System database was reviewed. and patient was instructed, not to drive, operate heavy machinery, perform activities at heights, swimming or participation in water activities or provide baby-sitting services while on Pain, Sleep and Anxiety Medications; until  their outpatient Physician has advised to do so again. Also recommended to not to take more than prescribed Pain, Sleep and Anxiety Medications.  ?Consultants: Orthopedic team Dr. Aline Brochure ?Procedures performed: ORIF of right hip ?Disposition: Skilled nursing facility ?Diet recommendation:  ?Cardiac and Carb modified diet ?DISCHARGE MEDICATION: ?Allergies as of 09/22/2021   ? ?   Reactions  ? Fiorinal [butalbital-aspirin-caffeine] Hives  ? Penicillins Rash  ? Tetanus Toxoids Rash  ? ?  ? ?  ?Medication List  ?  ? ?TAKE these medications   ? ?acetaminophen 325 MG tablet ?Commonly known as: TYLENOL ?Take 1-2 tablets (325-650 mg total) by mouth every 6 (six) hours as needed for mild pain (pain score 1-3 or temp > 100.5). ?  ?albuterol 108 (90 Base) MCG/ACT inhaler ?Commonly known as: VENTOLIN HFA ?Inhale 2 puffs into the lungs every 6 (six) hours as needed for wheezing or shortness of breath. ?  ?albuterol (2.5 MG/3ML) 0.083% nebulizer solution ?Commonly known as: PROVENTIL ?Take 2.5 mg by nebulization every 4 (four) hours as needed for wheezing or shortness of breath. Every 4-6 hours as needed ?  ?allopurinol 100 MG tablet ?Commonly known as: ZYLOPRIM ?Take 200 mg by mouth daily. ?  ?atorvastatin 20 MG tablet ?Commonly known as: LIPITOR ?Take 1 tablet by mouth daily. ?  ?calcitRIOL 0.25 MCG capsule ?Commonly known as: ROCALTROL ?Take 0.25-0.5 mcg by mouth See admin instructions. Take 2 tablets by mouth every other day and take 1 tablet daily on the other days ?  ?cholecalciferol 25 MCG (1000 UNIT) tablet ?Commonly known as: VITAMIN D3 ?Take 2,000 Units by mouth daily. ?  ?citalopram 20 MG  tablet ?Commonly known as: CELEXA ?Take 20 mg by mouth daily. ?  ?DHA-EPA-Flaxseed Oil-Vitamin E Caps ?Take 1 capsule by mouth daily. ?  ?docusate sodium 100 MG capsule ?Commonly known as: COLACE ?Take 1 capsule (100 mg total) by mouth 2 (two) times daily. ?  ?flecainide 100 MG tablet ?Commonly known as: TAMBOCOR ?Take 1 tablet (100 mg  total) by mouth 2 (two) times daily. ?  ?furosemide 80 MG tablet ?Commonly known as: LASIX ?Take 2 tablets (160 mg total) by mouth 2 (two) times daily. ?  ?gabapentin 100 MG capsule ?Commonly known as: NEURONTIN ?Take 1 capsule (100 mg total) by mouth 3 (three) times daily. ?  ?glipiZIDE 10 MG 24 hr tablet ?Commonly known as: GLUCOTROL XL ?Take 10 mg by mouth daily. ?  ?HYDROcodone-acetaminophen 7.5-325 MG tablet ?Commonly known as: NORCO ?Take 1 tablet by mouth 3 (three) times daily as needed for severe pain. ?  ?levothyroxine 75 MCG tablet ?Commonly known as: SYNTHROID ?Take 75 mcg by mouth daily before breakfast. ?  ?menthol-cetylpyridinium 3 MG lozenge ?Commonly known as: CEPACOL ?Take 1 lozenge (3 mg total) by mouth as needed for sore throat. ?  ?mupirocin ointment 2 % ?Commonly known as: BACTROBAN ?Place 1 application. into the nose 2 (two) times daily. ?  ?traMADol 50 MG tablet ?Commonly known as: ULTRAM ?Take 1 tablet (50 mg total) by mouth every 6 (six) hours. ?  ?vitamin B-12 1000 MCG tablet ?Commonly known as: CYANOCOBALAMIN ?Take 5,000 mcg by mouth daily. ?  ?warfarin 5 MG tablet ?Commonly known as: COUMADIN ?Take 2.5-5 mg by mouth See admin instructions. '5mg'$  Mon, Wed, Fri, Sat; 2.'5mg'$  other days ?  ? ?  ? ? ?Discharge Exam: ?Filed Weights  ? 09/19/21 1826 09/19/21 2249  ?Weight: 70.3 kg 70.2 kg  ? ? ? ? ?Physical Exam: ?  ?General:  AAO x 3,  cooperative, no distress;   ?HEENT:  Normocephalic, PERRL, otherwise with in Normal limits   ?Neuro:  CNII-XII intact. , normal motor and sensation, reflexes intact   ?Lungs:   Clear to auscultation BL, Respirations unlabored,  ?No wheezes / crackles  ?Cardio:    S1/S2, RRR, No murmure, No Rubs or Gallops   ?Abdomen:  Soft, non-tender, bowel sounds active all four quadrants, ?no guarding or peritoneal signs.  ?Muscular  ?skeletal:  Limited exam -global generalized weaknesses ?Right hip surgical wound dressing in place, mild tenderness range of motion limited due to  pain ?- in bed, able to move all 4 extremities,   ?2+ pulses,  symmetric, No pitting edema  ?Skin:  Dry, warm to touch, negative for any Rashes,  ?Wounds: Please see nursing documentation ?   ? ? ?  ? ? ?Condition at discharge: stable ? ?The results of significant diagnostics from this hospitalization (including imaging, microbiology, ancillary and laboratory) are listed below for reference.  ? ?Imaging Studies: ?CT Head Wo Contrast ? ?Result Date: 09/19/2021 ?CLINICAL DATA:  Head trauma, GCS=15, scalp hematoma (Ped 0-1y) on coumadin EXAM: CT HEAD WITHOUT CONTRAST TECHNIQUE: Contiguous axial images were obtained from the base of the skull through the vertex without intravenous contrast. RADIATION DOSE REDUCTION: This exam was performed according to the departmental dose-optimization program which includes automated exposure control, adjustment of the mA and/or kV according to patient size and/or use of iterative reconstruction technique. COMPARISON:  08/27/2016 FINDINGS: Brain: No evidence of acute infarction, hemorrhage, hydrocephalus, extra-axial collection or mass lesion/mass effect. Remote right MCA territory infarction. Patchy low-density changes within the periventricular and subcortical  white matter compatible with chronic microvascular ischemic change. Mild diffuse cerebral volume loss. Vascular: Atherosclerotic calcifications involving the large vessels of the skull base. No unexpected hyperdense vessel. Skull: Normal. Negative for fracture or focal lesion. Sinuses/Orbits: No acute finding. Other: None. IMPRESSION: 1. No acute intracranial abnormality. 2. Remote right MCA territory infarction. Electronically Signed   By: Davina Poke D.O.   On: 09/19/2021 19:22  ? ?DG Chest Port 1 View ? ?Result Date: 09/19/2021 ?CLINICAL DATA:  Fall, trauma, pain. EXAM: PORTABLE CHEST 1 VIEW COMPARISON:  04/28/2020 FINDINGS: Dual lead left-sided pacemaker in place. Chronic elevation of left hemidiaphragm. Stable heart  size and mediastinal contours. Aortic atherosclerosis. Incidental azygous fissure. Previous COVID pneumonia has resolved with minimal residual scarring in the right upper lobe. There is no acute airspace

## 2021-09-22 NOTE — Plan of Care (Signed)

## 2021-09-22 NOTE — TOC Initial Note (Signed)
Transition of Care (TOC) - Initial/Assessment Note  ? ? ?Patient Details  ?Name: [name] ?MRN: [id number] ?Date of Birth: Dec 01, 1940 ? ?Transition of Care (TOC) CM/SW Contact:    ?Iona Beard, LCSWA ?Phone Number: ?09/22/2021, 10:07 AM ? ?Clinical Narrative:                 ?Per chart review TOC found that PT is recommending SNF for pt at D/C. CSW spoke with pt about this recommendation. Pt states that she is agreeable to SNF referral and would like it only sent to [facility]. CSW to complete referral and send out to facility. TOC to follow.  ? ?Expected Discharge Plan: [place] ?Barriers to Discharge: Continued Medical Work up ? ? ?Patient Goals and CMS Choice ?Patient states their goals for this hospitalization and ongoing recovery are:: Go to SNF ?CMS Medicare.gov Compare Post Acute Care list provided to:: Patient ?Choice offered to / list presented to : Patient ? ?Expected Discharge Plan and Services ?Expected Discharge Plan: [place] ?In-house Referral: Clinical Social Work ?  ?Post Acute Care Choice: [place] ?Living arrangements for the past 2 months: [place] ?                ?  ?  ?  ?  ?  ?  ?  ?  ?  ?  ? ?Prior Living Arrangements/Services ?Living arrangements for the past 2 months: [place] ?Lives with:: Self ?Patient language and need for interpreter reviewed:: Yes ?Do you feel safe going back to the place where you live?: Yes      ?Need for Family Participation in Patient Care: No (Comment) ?Care giver support system in place?: Yes (comment) ?  ?Criminal Activity/Legal Involvement Pertinent to Current Situation/Hospitalization: No - Comment as needed ? ?Activities of Daily Living ?Home Assistive Devices/Equipment: Eyeglasses, Hearing aid ?ADL Screening (condition at time of admission) ?Patient's cognitive ability adequate to safely complete daily activities?: Yes ?Is the patient deaf or have difficulty hearing?: Yes ?Does  the patient have difficulty seeing, even when wearing glasses/contacts?: Yes ?Does the patient have difficulty concentrating, remembering, or making decisions?: No ?Patient able to express need for assistance with ADLs?: No ?Does the patient have difficulty dressing or bathing?: No ?Independently performs ADLs?: Yes (appropriate for developmental age) ?Does the patient have difficulty walking or climbing stairs?: No ?Weakness of Legs: None ?Weakness of Arms/Hands: None ? ?Permission Sought/Granted ?  ?  ?   ?   ?   ?   ? ?Emotional Assessment ?Appearance:: Appears stated age ?Attitude/Demeanor/Rapport: Engaged ?Affect (typically observed): Accepting ?Orientation: : Oriented to Self, Oriented to Place, Oriented to  Time, Oriented to Situation ?Alcohol / Substance Use: Not Applicable ?Psych Involvement: No (comment) ? ?Admission diagnosis:  Hypokalemia [E87.6] ?Closed right hip fracture ([place]) [S72.001A] ?Closed fracture of neck of right femur, initial encounter ([place]) [S72.001A] ?Patient Active Problem List  ? Diagnosis Date Noted  ? Closed fracture of neck of right femur ([place]) 09/19/2021  ? Transaminasemia   ? Hypokalemia 04/29/2020  ? Hypoalbuminemia 04/29/2020  ? Acute respiratory failure with hypoxia ([place]) 04/29/2020  ? Pneumonia due to COVID-19 virus 04/28/2020  ? 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 Symptomatic bradycardia 02/04/2012  ? Atrial fibrillation ([place]) 02/04/2012  ? Medial meniscus, posterior horn derangement 11/16/2011  ? Osteoarthritis, knee 11/16/2011  ? Loose, body, joint, knee 11/16/2011  ? Osteoarthritis of left knee 11/04/2011  ? Medial meniscus tear 11/04/2011  ? Adenomatous polyps 02/19/2011  ?  Acute ill-defined cerebrovascular disease 04/25/2007  ? A-fib ([place]) 04/25/2007  ? Clinical depression 04/25/2007  ? HLD (hyperlipidemia) 04/25/2007  ? Type 2 diabetes mellitus ([place]) 04/25/2007  ? 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 Essential (primary) hypertension  04/25/2007  ? Adult hypothyroidism 04/25/2007  ? ?PCP:  Celene Squibb, MD ?Pharmacy:   ?[facility] - [place], [place] - [address] ?[address] ?[place] [address] ?Phone: [phone] Fax: [phone] ? ? ? ? ?Social Determinants of Health (SDOH) Interventions ?  ? ?Readmission Risk Interventions ?   ? View : No data to display.  ?  ?  ?  ? ? ? ?

## 2021-09-22 NOTE — NC FL2 (Signed)
?[place] MEDICAID FL2 LEVEL OF CARE SCREENING TOOL  ?  ? ?IDENTIFICATION  ?Patient Name: ?[name] Birthdate: July 23, 1940 Sex: female Admission Date (Current Location): ?09/19/2021  ?[place] and [place] Number: ? [place] and Address:  ?[facility] [address], [place] ?     Provider Number: ?[id number]  ?Attending Physician Name and Address:  ?Deatra James, MD ? Relative Name and Phone Number:  ?  ?   ?Current Level of Care: ?Hospital Recommended Level of Care: ?[place] Prior Approval Number: ?  ? ?Date Approved/Denied: ?  PASRR Number: ?  ? ?Discharge Plan: ?SNF ?  ? ?Current Diagnoses: ?Patient Active Problem List  ? Diagnosis Date Noted  ? Closed fracture of neck of right femur ([place]) 09/19/2021  ? Transaminasemia   ? Hypokalemia 04/29/2020  ? Hypoalbuminemia 04/29/2020  ? Acute respiratory failure with hypoxia ([place]) 04/29/2020  ? Pneumonia due to COVID-19 virus 04/28/2020  ? 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 Symptomatic bradycardia 02/04/2012  ? Atrial fibrillation ([place]) 02/04/2012  ? Medial meniscus, posterior horn derangement 11/16/2011  ? Osteoarthritis, knee 11/16/2011  ? Loose, body, joint, knee 11/16/2011  ? Osteoarthritis of left knee 11/04/2011  ? Medial meniscus tear 11/04/2011  ? Adenomatous polyps 02/19/2011  ? Acute ill-defined cerebrovascular disease 04/25/2007  ? A-fib ([place]) 04/25/2007  ? Clinical depression 04/25/2007  ? HLD (hyperlipidemia) 04/25/2007  ? Type 2 diabetes mellitus ([place]) 04/25/2007  ? 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 Essential (primary) hypertension 04/25/2007  ? Adult hypothyroidism 04/25/2007  ? ? ?Orientation RESPIRATION BLADDER Height & Weight   ?  ?Self, Time, Situation, Place ? Normal Continent Weight: 154 lb 12.2 oz (70.2 kg) ?Height:  '5\' 5"'$  (165.1 cm)  ?BEHAVIORAL SYMPTOMS/MOOD NEUROLOGICAL BOWEL NUTRITION STATUS  ?    Continent Diet (Heart Healthy)  ?AMBULATORY  STATUS COMMUNICATION OF NEEDS Skin   ?Extensive Assist Verbally Other (Comment) (Closed incision right hip) ?  ?  ?  ?    ?     ?     ? ? ?Personal Care Assistance Level of Assistance  ?Bathing, Feeding, Dressing Bathing Assistance: Limited assistance ?Feeding assistance: Independent ?Dressing Assistance: Limited assistance ?   ? ?Functional Limitations Info  ?Hearing, Sight, Speech Sight Info: Impaired ?Hearing Info: Impaired ?Speech Info: Adequate  ? ? ?SPECIAL CARE FACTORS FREQUENCY  ?PT (By licensed PT), OT (By licensed OT)   ?  ?PT Frequency: 5 times weekly ?OT Frequency: 5 times weekly ?  ?  ?  ?   ? ? ?Contractures Contractures Info: Not present  ? ? ?Additional Factors Info  ?Code Status, Allergies Code Status Info: FULL ?Allergies Info: Fiorinal (Butalbital-aspirin-caffeine), Penicillins, Tetanus Toxoids ?  ?  ?  ?   ? ?Current Medications (09/22/2021):  This is the current hospital active medication list ?Current Facility-Administered Medications  ?Medication Dose Route Frequency Provider Last Rate Last Admin  ? 0.9 %  sodium chloride infusion   Intravenous Continuous Carole Civil, MD   Stopped at 09/21/21 2351  ? acetaminophen (TYLENOL) tablet 650 mg  650 mg Oral Q6H PRN Carole Civil, MD      ? Or  ? acetaminophen (TYLENOL) suppository 650 mg  650 mg Rectal Q6H PRN Carole Civil, MD      ? acetaminophen (TYLENOL) tablet 325-650 mg  325-650 mg Oral Q6H PRN Carole Civil, MD      ? albuterol (PROVENTIL) (2.5 MG/3ML) 0.083% nebulizer solution  2.5 mg  2.5 mg Nebulization Q4H PRN Carole Civil, MD      ? atorvastatin (LIPITOR) tablet 20 mg  20 mg Oral Daily Carole Civil, MD   20 mg at 09/22/21 8250  ? calcitRIOL (ROCALTROL) capsule 0.25 mcg  0.25 mcg Oral Sandra Cockayne, MD   0.25 mcg at 09/21/21 5397  ? calcitRIOL (ROCALTROL) capsule 0.5 mcg  0.5 mcg Oral Sandra Cockayne, MD      ? citalopram (CELEXA) tablet 20 mg  20 mg Oral Daily Carole Civil,  MD   20 mg at 09/22/21 6734  ? docusate sodium (COLACE) capsule 100 mg  100 mg Oral BID Carole Civil, MD   100 mg at 09/22/21 1937  ? feeding supplement (ENSURE ENLIVE / ENSURE PLUS) liquid 237 mL  237 mL Oral BID BM Carole Civil, MD   237 mL at 09/22/21 0931  ? flecainide (TAMBOCOR) tablet 100 mg  100 mg Oral BID Carole Civil, MD   100 mg at 09/22/21 9024  ? furosemide (LASIX) tablet 160 mg  160 mg Oral BID Carole Civil, MD   160 mg at 09/22/21 0973  ? gabapentin (NEURONTIN) capsule 100 mg  100 mg Oral TID Carole Civil, MD   100 mg at 09/22/21 0843  ? HYDROcodone-acetaminophen (NORCO) 7.5-325 MG per tablet 1 tablet  1 tablet Oral TID PRN Carole Civil, MD   1 tablet at 09/19/21 2331  ? HYDROcodone-acetaminophen (NORCO) 7.5-325 MG per tablet 1-2 tablet  1-2 tablet Oral Q4H PRN Carole Civil, MD      ? HYDROcodone-acetaminophen (NORCO/VICODIN) 5-325 MG per tablet 1-2 tablet  1-2 tablet Oral Q4H PRN Carole Civil, MD   2 tablet at 09/22/21 [phone]  ? insulin aspart (novoLOG) injection 0-15 Units  0-15 Units Subcutaneous TID WC Carole Civil, MD   2 Units at 09/22/21 [phone]  ? levothyroxine (SYNTHROID) tablet 75 mcg  75 mcg Oral QAC breakfast Carole Civil, MD   75 mcg at 09/22/21 0531  ? menthol-cetylpyridinium (CEPACOL) lozenge 3 mg  1 lozenge Oral PRN Carole Civil, MD      ? Or  ? phenol (CHLORASEPTIC) mouth spray 1 spray  1 spray Mouth/Throat PRN Carole Civil, MD      ? methocarbamol (ROBAXIN) tablet 500 mg  500 mg Oral Q6H PRN Carole Civil, MD      ? Or  ? methocarbamol (ROBAXIN) 500 mg in dextrose 5 % 50 mL IVPB  500 mg Intravenous Q6H PRN Carole Civil, MD      ? metoCLOPramide (REGLAN) tablet 5-10 mg  5-10 mg Oral Q8H PRN Carole Civil, MD      ? Or  ? metoCLOPramide (REGLAN) injection 5-10 mg  5-10 mg Intravenous Q8H PRN Carole Civil, MD      ? morphine (PF) 2 MG/ML injection 0.5-1 mg  0.5-1 mg Intravenous Q2H  PRN Carole Civil, MD      ? morphine (PF) 2 MG/ML injection 2 mg  2 mg Intravenous Q2H PRN Carole Civil, MD   2 mg at 09/20/21 0945  ? multivitamin with minerals tablet 1 tablet  1 tablet Oral Daily Phillips Grout, MD   1 tablet at 09/22/21 6834  ? mupirocin ointment (BACTROBAN) 2 % 1 application.  1 application. Nasal BID Carole Civil, MD   1 application. at 09/21/21 2215  ? ondansetron (ZOFRAN) tablet 4  mg  4 mg Oral Q6H PRN Carole Civil, MD      ? Or  ? ondansetron [facility]) injection 4 mg  4 mg Intravenous Q6H PRN Carole Civil, MD   4 mg at 09/19/21 2210  ? polyethylene glycol (MIRALAX / GLYCOLAX) packet 17 g  17 g Oral Daily PRN Carole Civil, MD      ? traMADol Veatrice Bourbon) tablet 50 mg  50 mg Oral Q6H Carole Civil, MD   50 mg at 09/22/21 0531  ? vitamin B-12 (CYANOCOBALAMIN) tablet 5,000 mcg  5,000 mcg Oral Daily Carole Civil, MD   5,000 mcg at 09/22/21 2671  ? Warfarin - Pharmacist Dosing Inpatient   Does not apply q1600 Phillips Grout, MD      ? ? ? ?Discharge Medications: ?Please see discharge summary for a list of discharge medications. ? ?Relevant Imaging Results: ? ?Relevant Lab Results: ? ? ?Additional Information ?SSN: [id number] ? ?Iona Beard, LCSWA ? ? ? ? ?

## 2021-09-23 DIAGNOSIS — R262 Difficulty in walking, not elsewhere classified: Secondary | ICD-10-CM | POA: Diagnosis not present

## 2021-09-23 DIAGNOSIS — S72001A Fracture of unspecified part of neck of right femur, initial encounter for closed fracture: Secondary | ICD-10-CM | POA: Diagnosis not present

## 2021-09-23 DIAGNOSIS — R600 Localized edema: Secondary | ICD-10-CM | POA: Diagnosis not present

## 2021-09-23 DIAGNOSIS — D696 Thrombocytopenia, unspecified: Secondary | ICD-10-CM | POA: Diagnosis not present

## 2021-09-23 DIAGNOSIS — Z9181 History of falling: Secondary | ICD-10-CM | POA: Diagnosis not present

## 2021-09-23 DIAGNOSIS — Z4789 Encounter for other orthopedic aftercare: Secondary | ICD-10-CM | POA: Diagnosis not present

## 2021-09-23 DIAGNOSIS — D649 Anemia, unspecified: Secondary | ICD-10-CM | POA: Diagnosis not present

## 2021-09-23 DIAGNOSIS — E1122 Type 2 diabetes mellitus with diabetic chronic kidney disease: Secondary | ICD-10-CM | POA: Diagnosis not present

## 2021-09-23 DIAGNOSIS — E039 Hypothyroidism, unspecified: Secondary | ICD-10-CM | POA: Diagnosis not present

## 2021-09-23 DIAGNOSIS — I10 Essential (primary) hypertension: Secondary | ICD-10-CM | POA: Diagnosis not present

## 2021-09-23 DIAGNOSIS — Z961 Presence of intraocular lens: Secondary | ICD-10-CM | POA: Diagnosis not present

## 2021-09-23 DIAGNOSIS — N2581 Secondary hyperparathyroidism of renal origin: Secondary | ICD-10-CM | POA: Diagnosis not present

## 2021-09-23 DIAGNOSIS — S72001S Fracture of unspecified part of neck of right femur, sequela: Secondary | ICD-10-CM | POA: Diagnosis not present

## 2021-09-23 DIAGNOSIS — R2681 Unsteadiness on feet: Secondary | ICD-10-CM | POA: Diagnosis not present

## 2021-09-23 DIAGNOSIS — S72001D Fracture of unspecified part of neck of right femur, subsequent encounter for closed fracture with routine healing: Secondary | ICD-10-CM | POA: Diagnosis not present

## 2021-09-23 DIAGNOSIS — I480 Paroxysmal atrial fibrillation: Secondary | ICD-10-CM | POA: Diagnosis not present

## 2021-09-23 DIAGNOSIS — Z950 Presence of cardiac pacemaker: Secondary | ICD-10-CM | POA: Diagnosis not present

## 2021-09-23 DIAGNOSIS — M1A30X0 Chronic gout due to renal impairment, unspecified site, without tophus (tophi): Secondary | ICD-10-CM | POA: Diagnosis not present

## 2021-09-23 DIAGNOSIS — Z741 Need for assistance with personal care: Secondary | ICD-10-CM | POA: Diagnosis not present

## 2021-09-23 DIAGNOSIS — E1159 Type 2 diabetes mellitus with other circulatory complications: Secondary | ICD-10-CM | POA: Diagnosis not present

## 2021-09-23 DIAGNOSIS — F339 Major depressive disorder, recurrent, unspecified: Secondary | ICD-10-CM | POA: Diagnosis not present

## 2021-09-23 DIAGNOSIS — E785 Hyperlipidemia, unspecified: Secondary | ICD-10-CM | POA: Diagnosis not present

## 2021-09-23 DIAGNOSIS — K5909 Other constipation: Secondary | ICD-10-CM | POA: Diagnosis not present

## 2021-09-23 DIAGNOSIS — I639 Cerebral infarction, unspecified: Secondary | ICD-10-CM | POA: Diagnosis not present

## 2021-09-23 DIAGNOSIS — I4891 Unspecified atrial fibrillation: Secondary | ICD-10-CM | POA: Diagnosis not present

## 2021-09-23 DIAGNOSIS — M6281 Muscle weakness (generalized): Secondary | ICD-10-CM | POA: Diagnosis not present

## 2021-09-23 DIAGNOSIS — M159 Polyosteoarthritis, unspecified: Secondary | ICD-10-CM | POA: Diagnosis not present

## 2021-09-23 DIAGNOSIS — Z96612 Presence of left artificial shoulder joint: Secondary | ICD-10-CM | POA: Diagnosis not present

## 2021-09-23 DIAGNOSIS — N183 Chronic kidney disease, stage 3 unspecified: Secondary | ICD-10-CM | POA: Diagnosis not present

## 2021-09-23 DIAGNOSIS — E1129 Type 2 diabetes mellitus with other diabetic kidney complication: Secondary | ICD-10-CM | POA: Diagnosis not present

## 2021-09-23 DIAGNOSIS — R69 Illness, unspecified: Secondary | ICD-10-CM | POA: Diagnosis not present

## 2021-09-23 DIAGNOSIS — E1169 Type 2 diabetes mellitus with other specified complication: Secondary | ICD-10-CM | POA: Diagnosis not present

## 2021-09-23 DIAGNOSIS — E1142 Type 2 diabetes mellitus with diabetic polyneuropathy: Secondary | ICD-10-CM | POA: Diagnosis not present

## 2021-09-23 DIAGNOSIS — E876 Hypokalemia: Secondary | ICD-10-CM | POA: Diagnosis not present

## 2021-09-23 DIAGNOSIS — K219 Gastro-esophageal reflux disease without esophagitis: Secondary | ICD-10-CM | POA: Diagnosis not present

## 2021-09-23 DIAGNOSIS — K861 Other chronic pancreatitis: Secondary | ICD-10-CM | POA: Diagnosis not present

## 2021-09-23 DIAGNOSIS — I69098 Other sequelae following nontraumatic subarachnoid hemorrhage: Secondary | ICD-10-CM | POA: Diagnosis not present

## 2021-09-23 DIAGNOSIS — N189 Chronic kidney disease, unspecified: Secondary | ICD-10-CM | POA: Diagnosis not present

## 2021-09-23 DIAGNOSIS — I152 Hypertension secondary to endocrine disorders: Secondary | ICD-10-CM | POA: Diagnosis not present

## 2021-09-23 LAB — CBC
HCT: 32.3 % — ABNORMAL LOW (ref 36.0–46.0)
Hemoglobin: 10.4 g/dL — ABNORMAL LOW (ref 12.0–15.0)
MCH: 32.3 pg (ref 26.0–34.0)
MCHC: 32.2 g/dL (ref 30.0–36.0)
MCV: 100.3 fL — ABNORMAL HIGH (ref 80.0–100.0)
Platelets: 100 10*3/uL — ABNORMAL LOW (ref 150–400)
RBC: 3.22 MIL/uL — ABNORMAL LOW (ref 3.87–5.11)
RDW: 14.8 % (ref 11.5–15.5)
WBC: 7 10*3/uL (ref 4.0–10.5)
nRBC: 0 % (ref 0.0–0.2)

## 2021-09-23 LAB — GLUCOSE, CAPILLARY
Glucose-Capillary: 148 mg/dL — ABNORMAL HIGH (ref 70–99)
Glucose-Capillary: 143 mg/dL — ABNORMAL HIGH (ref 70–99)

## 2021-09-23 LAB — PROTIME-INR
INR: 1.1 (ref 0.8–1.2)
Prothrombin Time: 13.8 seconds (ref 11.4–15.2)

## 2021-09-23 MED ORDER — WARFARIN SODIUM 5 MG PO TABS: 5.0000 mg | ORAL_TABLET | Freq: Once | ORAL | Status: DC

## 2021-09-23 NOTE — Progress Notes (Signed)
Nsg Discharge Note ? ?Admit Date:  09/19/2021 ?Discharge date: 09/23/2021 ?  ?[name] to be D/C'd Home per MD order.  AVS completed.  Copy for chart, and copy for patient signed, and dated. ?Patient/caregiver able to verbalize understanding. ? ?Discharge Medication: ?Allergies as of 09/23/2021   ? ?   Reactions  ? Fiorinal [butalbital-aspirin-caffeine] Hives  ? Penicillins Rash  ? Tetanus Toxoids Rash  ? ?  ? ?  ?Medication List  ?  ? ?TAKE these medications   ? ?acetaminophen 325 MG tablet ?Commonly known as: TYLENOL ?Take 1-2 tablets (325-650 mg total) by mouth every 6 (six) hours as needed for mild pain (pain score 1-3 or temp > 100.5). ?  ?albuterol 108 (90 Base) MCG/ACT inhaler ?Commonly known as: VENTOLIN HFA ?Inhale 2 puffs into the lungs every 6 (six) hours as needed for wheezing or shortness of breath. ?  ?albuterol (2.5 MG/3ML) 0.083% nebulizer solution ?Commonly known as: PROVENTIL ?Take 2.5 mg by nebulization every 4 (four) hours as needed for wheezing or shortness of breath. Every 4-6 hours as needed ?  ?allopurinol 100 MG tablet ?Commonly known as: ZYLOPRIM ?Take 200 mg by mouth daily. ?  ?atorvastatin 20 MG tablet ?Commonly known as: LIPITOR ?Take 1 tablet by mouth daily. ?  ?calcitRIOL 0.25 MCG capsule ?Commonly known as: ROCALTROL ?Take 0.25-0.5 mcg by mouth See admin instructions. Take 2 tablets by mouth every other day and take 1 tablet daily on the other days ?  ?cholecalciferol 25 MCG (1000 UNIT) tablet ?Commonly known as: VITAMIN D3 ?Take 2,000 Units by mouth daily. ?  ?citalopram 20 MG tablet ?Commonly known as: CELEXA ?Take 20 mg by mouth daily. ?  ?DHA-EPA-Flaxseed Oil-Vitamin E Caps ?Take 1 capsule by mouth daily. ?  ?docusate sodium 100 MG capsule ?Commonly known as: COLACE ?Take 1 capsule (100 mg total) by mouth 2 (two) times daily. ?  ?flecainide 100 MG tablet ?Commonly known as: TAMBOCOR ?Take 1 tablet (100 mg total) by mouth 2 (two) times daily. ?  ?furosemide 80 MG tablet ?Commonly  known as: LASIX ?Take 2 tablets (160 mg total) by mouth 2 (two) times daily. ?  ?gabapentin 100 MG capsule ?Commonly known as: NEURONTIN ?Take 1 capsule (100 mg total) by mouth 3 (three) times daily. ?  ?glipiZIDE 10 MG 24 hr tablet ?Commonly known as: GLUCOTROL XL ?Take 10 mg by mouth daily. ?  ?HYDROcodone-acetaminophen 7.5-325 MG tablet ?Commonly known as: NORCO ?Take 1 tablet by mouth 3 (three) times daily as needed for severe pain. ?  ?levothyroxine 75 MCG tablet ?Commonly known as: SYNTHROID ?Take 75 mcg by mouth daily before breakfast. ?  ?menthol-cetylpyridinium 3 MG lozenge ?Commonly known as: CEPACOL ?Take 1 lozenge (3 mg total) by mouth as needed for sore throat. ?  ?mupirocin ointment 2 % ?Commonly known as: BACTROBAN ?Place 1 application. into the nose 2 (two) times daily. ?  ?traMADol 50 MG tablet ?Commonly known as: ULTRAM ?Take 1 tablet (50 mg total) by mouth every 6 (six) hours. ?  ?vitamin B-12 1000 MCG tablet ?Commonly known as: CYANOCOBALAMIN ?Take 5,000 mcg by mouth daily. ?  ?warfarin 5 MG tablet ?Commonly known as: COUMADIN ?Take 2.5-5 mg by mouth See admin instructions. '5mg'$  Mon, Wed, Fri, Sat; 2.'5mg'$  other days ?  ? ?  ? ?  ?  ? ? ?  ?Discharge Care Instructions  ?(From admission, onward)  ?  ? ? ?  ? ?  Start     Ordered  ? 09/23/21 0000  Discharge wound care:       ?Comments: Per wound care instructions orthopedic  ? 09/23/21 1120  ? ?  ?  ? ?  ? ? ?Discharge Assessment: ?Vitals:  ? 09/22/21 1551 09/23/21 0356  ?BP: 102/62 111/70  ?Pulse: 63 62  ?Resp: 17 17  ?Temp: 98.7 ?F (37.1 ?C) 97.7 ?F (36.5 ?C)  ?SpO2: 92% 97%  ? Skin clean, dry and intact without evidence of skin break down, no evidence of skin tears noted. ?IV catheter discontinued intact. Site without signs and symptoms of complications - no redness or edema noted at insertion site, patient denies c/o pain - only slight tenderness at site.  Dressing with slight pressure applied. ? ?D/c Instructions-Education: ?Discharge  instructions given to patient/family with verbalized understanding. ?D/c education completed with patient/family including follow up instructions, medication list, d/c activities limitations if indicated, with other d/c instructions as indicated by MD - patient able to verbalize understanding, all questions fully answered. ?Patient instructed to return to ED, call 911, or call MD for any changes in condition.  ?Patient escorted via [place], and D/C home via private auto. ? ?Dorcas Mcmurray, LPN ?09/24/8411 2:44 PM  ?

## 2021-09-23 NOTE — Care Management Important Message (Signed)
Important Message ? ?Patient Details  ?Name: [name] ?MRN: [id number] ?Date of Birth: November 02, 1940 ? ? ?Medicare Important Message Given:  N/A - LOS <3 / Initial given by admissions ? ? ? ? ?Tommy Medal ?09/23/2021, 11:38 AM ?

## 2021-09-23 NOTE — Progress Notes (Signed)
ANTICOAGULATION CONSULT NOTE -  ? ?Pharmacy Consult for coumadin ?Indication: atrial fibrillation ? ?Allergies  ?Allergen Reactions  ? Fiorinal [Butalbital-Aspirin-Caffeine] Hives  ? Penicillins Rash  ? Tetanus Toxoids Rash  ? ? ?Patient Measurements: ?Height: '5\' 5"'$  (165.1 cm) ?Weight: 70.2 kg (154 lb 12.2 oz) ?IBW/kg (Calculated) : 57 ? ?Vital Signs: ?Temp: 97.7 ?F (36.5 ?C) (05/02 0356) ?Temp Source: Oral (05/02 0356) ?BP: 111/70 (05/02 0356) ?Pulse Rate: 62 (05/02 0356) ? ?Labs: ?Recent Labs  ?  09/21/21 ?0429 09/21/21 ?0757 09/22/21 ?5409 09/23/21 ?0541  ?HGB 11.6*  --  10.3* 10.4*  ?HCT 36.2  --  33.0* 32.3*  ?PLT 89*  --  98* 100*  ?LABPROT 13.5  --  13.8 13.8  ?INR 1.0  --  1.1 1.1  ?CREATININE  --  1.24*  --   --   ? ? ? ?Estimated Creatinine Clearance: 36.2 mL/min (A) (by C-G formula based on SCr of 1.24 mg/dL (H)). ? ? ?Medical History: ?Past Medical History:  ?Diagnosis Date  ? A-fib ([place])   ? 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 Chronic kidney disease   ? Colon polyps   ? CVA (cerebral vascular accident) [facility])   ? left sided weakness. Has 40% weakness of left arm  ? Depression   ? DM (diabetes mellitus) ([place])   ? GERD (gastroesophageal reflux disease)   ? HTN (hypertension)   ? Hypercholesterolemia   ? Hypothyroidism   ? Pancreatitis   ? biliary  ? S/P colonoscopy 07/2004  ? Dr. Olevia Perches: per e-chart report: diverticulosis, path adenomatous polyps  ? S/P endoscopy 07/2007  ? multiple 3-4 mmsessile gastric polyps, benign, singl antral erosions, negative H.pylori, reactive gastropathy ? NSAIDs  ? Symptomatic bradycardia   ? 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 ? ?Medications:  ?Medications Prior to Admission  ?Medication Sig Dispense Refill Last Dose  ? albuterol (PROVENTIL) (2.5 MG/3ML) 0.083% nebulizer solution Take 2.5 mg by nebulization every 4 (four) hours as needed for wheezing or shortness of breath. Every 4-6 hours as needed   unknown  ? albuterol (VENTOLIN HFA) 108 (90 Base) MCG/ACT inhaler Inhale 2 puffs  into the lungs every 6 (six) hours as needed for wheezing or shortness of breath.   unknown  ? allopurinol (ZYLOPRIM) 100 MG tablet Take 200 mg by mouth daily.   09/19/2021  ? atorvastatin (LIPITOR) 20 MG tablet Take 1 tablet by mouth daily.   09/18/2021  ? calcitRIOL (ROCALTROL) 0.25 MCG capsule Take 0.25-0.5 mcg by mouth See admin instructions. Take 2 tablets by mouth every other day and take 1 tablet daily on the other days   09/19/2021  ? cholecalciferol (VITAMIN D3) 25 MCG (1000 UNIT) tablet Take 2,000 Units by mouth daily.   Past Week  ? citalopram (CELEXA) 20 MG tablet Take 20 mg by mouth daily.   09/19/2021  ? DHA-EPA-Flaxseed Oil-Vitamin E CAPS Take 1 capsule by mouth daily.   09/19/2021  ? flecainide (TAMBOCOR) 100 MG tablet Take 1 tablet (100 mg total) by mouth 2 (two) times daily. 180 tablet 3 09/19/2021  ? furosemide (LASIX) 80 MG tablet Take 2 tablets (160 mg total) by mouth 2 (two) times daily.   09/19/2021  ? gabapentin (NEURONTIN) 100 MG capsule Take 1 capsule (100 mg total) by mouth 3 (three) times daily. 90 capsule 5 09/19/2021  ? glipiZIDE (GLUCOTROL XL) 10 MG 24 hr tablet Take 10 mg by mouth daily.   09/19/2021  ? HYDROcodone-acetaminophen (NORCO) 7.5-325 MG tablet  Take 1 tablet by mouth 3 (three) times daily as needed for severe pain.   09/19/2021  ? levothyroxine (SYNTHROID, LEVOTHROID) 75 MCG tablet Take 75 mcg by mouth daily before breakfast.   09/19/2021  ? vitamin B-12 (CYANOCOBALAMIN) 1000 MCG tablet Take 5,000 mcg by mouth daily.   09/19/2021  ? warfarin (COUMADIN) 5 MG tablet Take 2.5-5 mg by mouth See admin instructions. '5mg'$  Mon, Wed, Fri, Sat; 2.'5mg'$  other days   09/18/2021 at 2330  ? ? ?Assessment: ?81 year old female had a mechanical fall use sustained a right femoral neck fracture presented to the ER and right hip pain with no ability to ambulate. Repair with hip pinning 09/20/21. Pharmacy asked to manage coumadin for patient who is chronically anticoagulated for afib. Coumadin is to restart  09/21/21. Patient did receive Vit. K '5mg'$  IV 09/19/21 at 2115. INR today prior to surgery was 1.3.  ?Home dose is '5mg'$  on M,W, F , Saturday and 2.'5mg'$  T, Th, Sunday. ? ?INR 1.1 ? ?Goal of Therapy:  ?INR 2-3 ?Monitor platelets by anticoagulation protocol: Yes ?  ?Plan:  ?Coumadin '5mg'$  po x 1 today ?PT-INR daily ?CBC 3 times weekly ?Monitor for bleeding ? ?Donna Christen Krystall Kruckenberg, PharmD, MBA ?Clinical Pharmacist ? ?09/23/2021,8:26 AM ? ? ?

## 2021-09-23 NOTE — Progress Notes (Signed)
Patient discharged to [facility] report called ,and given to [facility] LPN. Transported by staff to an awaiting facility. Vital signs stable.No c/o pain or discomfort noted . ?

## 2021-09-23 NOTE — TOC Transition Note (Signed)
Transition of Care (TOC) - CM/SW Discharge Note ? ? ?Patient Details  ?Name: [name] ?MRN: [id number] ?Date of Birth: 10-22-1940 ? ?Transition of Care (TOC) CM/SW Contact:  ?Shade Flood, LCSW ?Phone Number: ?09/23/2021, 1:04 PM ? ? ?Clinical Narrative:    ? ?TOC following. Insurance has authorized SNF and pt can dc to [facility] today. Pt aware and in agreement. Kerri at [facility] aware. DC clinical sent electronically. RN to call report and arrange for pt to be transferred. ? ?No other TOC needs for dc. ? ?Final next level of care: [place] ?Barriers to Discharge: Barriers Resolved ? ? ?Patient Goals and CMS Choice ?Patient states their goals for this hospitalization and ongoing recovery are:: Go to SNF ?CMS Medicare.gov Compare Post Acute Care list provided to:: Patient ?Choice offered to / list presented to : Patient ? ?Discharge Placement ?  ?           ?Patient chooses bed at: [facility] ?Patient to be transferred to facility by: w/c ?Name of family member notified: pt only pt stated she would inform family ?Patient and family notified of of transfer: 09/23/21 ? ?Discharge Plan and Services ?In-house Referral: Clinical Social Work ?  ?Post Acute Care Choice: [place]          ?  ?  ?  ?  ?  ?  ?  ?  ?  ?  ? ?Social Determinants of Health (SDOH) Interventions ?  ? ? ?Readmission Risk Interventions ?   ? View : No data to display.  ?  ?  ?  ? ? ? ? ? ?

## 2021-09-23 NOTE — Discharge Summary (Signed)
?Physician Discharge Summary ?  ?Patient: [name] MRN: [id number] DOB: [birth year]  ?Admit date:     09/19/2021  ?Discharge date: 09/23/21  ?Discharge Physician: Valeria Batman Alvenia Treese  ? ?PCP: Celene Squibb, MD  ? ?The patient was seen and examined this morning, stable for discharge to SNF  ?Pending insurance approval ? ? ?recommendations at discharge:  ? ?Follow-up with orthopedic Dr. Dorothyann Peng Harrison:Weightbearing status as tolerated ?Staples to come out postop day 14 ?Images can be performed at 2 weeks, 6 weeks and 12 weeks ?The patient can resume her warfarin as DVT prophylaxis ?First postop visit postop day 13 ?Follow-up with PCP 2-3 weeks ? ?Discharge Diagnoses: ?Principal Problem: ?  Closed fracture of neck of right femur ([place]) ?Active Problems: ?  Atrial fibrillation ([place]) ?  HLD (hyperlipidemia) ?  Type 2 diabetes mellitus ([place]) ?  Adult hypothyroidism ?  Hypokalemia ? ?Resolved Problems: ?  * No resolved hospital problems. * ? ? ?[name] is a 81 y.o. female with medical history significant of atrial fibrillation, chronic kidney disease, CVA several decades ago, diabetes mellitus type 2, GERD, hypertension, hyperlipidemia, hypothyroidism, and more presents ED with a chief complaint of fall.  Patient reports that she had 2 falls this week.  1 was after church on Sunday, she was walking in a straight line, turned her head to talk to somebody and ended up twirling until she almost fell down.  She does not describe it as being dizzy.  She describes it as when she turns her head she just keeps turning.  She does not feel lightheaded when this happens either.  Encouraged somebody caught her.  Today, she was walking down [address] and turned her head to talk to somebody and the same thing happened.  She felt like she was twirling in a circle and she fell down landing directly on her right hip.  Patient reports she also hit her head on the right side.  She has no tenderness over her skull, she did not  blackout, she has no change in vision, no change in hearing.  Patient reports that her pain is 10 out of 10 when she tries to move her right leg.  It is a sharp grabbing pain.  At rest her pain is a 5 out of 10, and more like a dull ache.  The pain radiates toward her groin and the inside of her leg.  She has no numbness. ?  ?On review of systems patient does report that she had a stomach virus 2 weeks ago.  She had nausea, vomiting, diarrhea.  Since then she has had a decreased appetite.  Her potassium is low today at, which is likely partially due to the stomach virus, partially due to low appetite, partially due to Lasix daily. ?  ?Patient has no other complaints at this time. ?  ?Patient does not smoke, does not drink, does not use illicit drugs.  She is vaccinated for COVID.  Patient is full code. ?  ? ? ? ?* Closed fracture of neck of right femur ([place]) ?- Secondary to mechanical fall ?-Status post ORIF, postop day #4 ?-UA for preop assessment-clear ?-EKG shows paced rhythm ?-Pain control with Tylenol, Norco, morphine based on pain scale ? ? ?Hypokalemia ?Potassium was  2.8 ?Status follow-up with CMP, and replacement with p.o. KCl, ? ?Adult hypothyroidism ?- Continue Synthroid ? ?Type 2 diabetes mellitus ([place]) ?Resume glipizide ?-Check CBG QA CHS as needed, with SSI coverage ?-Diabetic diet ? ?HLD (  hyperlipidemia) ?Continue statin ? ?Atrial fibrillation ([place]) ?- Resume flecainide ?-On warfarin with therapeutic INR ?-Reversing warfarin with vitamin K for surgery  ?Resume Coumadin now with INR goal 2-3 ? ? ? ?Pain control - [employer] Controlled Substance Reporting System database was reviewed. and patient was instructed, not to drive, operate heavy machinery, perform activities at heights, swimming or participation in water activities or provide baby-sitting services while on Pain, Sleep and Anxiety Medications; until their outpatient Physician has advised to do so again. Also recommended to not to take more  than prescribed Pain, Sleep and Anxiety Medications.  ?Consultants: Orthopedic team Dr. Aline Brochure ?Procedures performed: ORIF of right hip ?Disposition: Skilled nursing facility ?Diet recommendation:  ?Cardiac and Carb modified diet ?DISCHARGE MEDICATION: ?Allergies as of 09/23/2021   ? ?   Reactions  ? Fiorinal [butalbital-aspirin-caffeine] Hives  ? Penicillins Rash  ? Tetanus Toxoids Rash  ? ?  ? ?  ?Medication List  ?  ? ?TAKE these medications   ? ?acetaminophen 325 MG tablet ?Commonly known as: TYLENOL ?Take 1-2 tablets (325-650 mg total) by mouth every 6 (six) hours as needed for mild pain (pain score 1-3 or temp > 100.5). ?  ?albuterol 108 (90 Base) MCG/ACT inhaler ?Commonly known as: VENTOLIN HFA ?Inhale 2 puffs into the lungs every 6 (six) hours as needed for wheezing or shortness of breath. ?  ?albuterol (2.5 MG/3ML) 0.083% nebulizer solution ?Commonly known as: PROVENTIL ?Take 2.5 mg by nebulization every 4 (four) hours as needed for wheezing or shortness of breath. Every 4-6 hours as needed ?  ?allopurinol 100 MG tablet ?Commonly known as: ZYLOPRIM ?Take 200 mg by mouth daily. ?  ?atorvastatin 20 MG tablet ?Commonly known as: LIPITOR ?Take 1 tablet by mouth daily. ?  ?calcitRIOL 0.25 MCG capsule ?Commonly known as: ROCALTROL ?Take 0.25-0.5 mcg by mouth See admin instructions. Take 2 tablets by mouth every other day and take 1 tablet daily on the other days ?  ?cholecalciferol 25 MCG (1000 UNIT) tablet ?Commonly known as: VITAMIN D3 ?Take 2,000 Units by mouth daily. ?  ?citalopram 20 MG tablet ?Commonly known as: CELEXA ?Take 20 mg by mouth daily. ?  ?DHA-EPA-Flaxseed Oil-Vitamin E Caps ?Take 1 capsule by mouth daily. ?  ?docusate sodium 100 MG capsule ?Commonly known as: COLACE ?Take 1 capsule (100 mg total) by mouth 2 (two) times daily. ?  ?flecainide 100 MG tablet ?Commonly known as: TAMBOCOR ?Take 1 tablet (100 mg total) by mouth 2 (two) times daily. ?  ?furosemide 80 MG tablet ?Commonly known as:  LASIX ?Take 2 tablets (160 mg total) by mouth 2 (two) times daily. ?  ?gabapentin 100 MG capsule ?Commonly known as: NEURONTIN ?Take 1 capsule (100 mg total) by mouth 3 (three) times daily. ?  ?glipiZIDE 10 MG 24 hr tablet ?Commonly known as: GLUCOTROL XL ?Take 10 mg by mouth daily. ?  ?HYDROcodone-acetaminophen 7.5-325 MG tablet ?Commonly known as: NORCO ?Take 1 tablet by mouth 3 (three) times daily as needed for severe pain. ?  ?levothyroxine 75 MCG tablet ?Commonly known as: SYNTHROID ?Take 75 mcg by mouth daily before breakfast. ?  ?menthol-cetylpyridinium 3 MG lozenge ?Commonly known as: CEPACOL ?Take 1 lozenge (3 mg total) by mouth as needed for sore throat. ?  ?mupirocin ointment 2 % ?Commonly known as: BACTROBAN ?Place 1 application. into the nose 2 (two) times daily. ?  ?traMADol 50 MG tablet ?Commonly known as: ULTRAM ?Take 1 tablet (50 mg total) by mouth every 6 (six) hours. ?  ?  vitamin B-12 1000 MCG tablet ?Commonly known as: CYANOCOBALAMIN ?Take 5,000 mcg by mouth daily. ?  ?warfarin 5 MG tablet ?Commonly known as: COUMADIN ?Take 2.5-5 mg by mouth See admin instructions. '5mg'$  Mon, Wed, Fri, Sat; 2.'5mg'$  other days ?  ? ?  ? ?  ?  ? ? ?  ?Discharge Care Instructions  ?(From admission, onward)  ?  ? ? ?  ? ?  Start     Ordered  ? 09/23/21 0000  Discharge wound care:       ?Comments: Per wound care instructions orthopedic  ? 09/23/21 1120  ? ?  ?  ? ?  ? ? ? Contact information for after-discharge care   ? ? Destination   ? ? [facility] .   ?Service: Skilled Nursing ?Contact information: ?[address] ?[place] ?[phone] ? ?  ?  ? ?  ?  ? ?  ?  ? ?  ? ?Discharge Exam: ?Filed Weights  ? 09/19/21 1826 09/19/21 2249  ?Weight: 70.3 kg 70.2 kg  ? ? ? ? ? ? ?Physical Exam: ?  ?General:  AAO x 3,  cooperative, no distress;   ?HEENT:  Normocephalic, PERRL, otherwise with in Normal limits   ?Neuro:  CNII-XII intact. , normal motor and sensation, reflexes  intact   ?Lungs:   Clear to auscultation BL, Respirations unlabored,  ?No wheezes / crackles  ?Cardio:    S1/S2, RRR, No murmure, No Rubs or Gallops   ?Abdomen:  Soft, non-tender, bowel sounds active all fou

## 2021-09-24 ENCOUNTER — Other Ambulatory Visit: Payer: Self-pay | Admitting: Adult Health

## 2021-09-24 ENCOUNTER — Non-Acute Institutional Stay (INDEPENDENT_AMBULATORY_CARE_PROVIDER_SITE_OTHER): Payer: Medicare HMO | Admitting: Adult Health

## 2021-09-24 ENCOUNTER — Encounter (HOSPITAL_COMMUNITY): Payer: Self-pay | Admitting: Orthopedic Surgery

## 2021-09-24 ENCOUNTER — Other Ambulatory Visit (HOSPITAL_COMMUNITY)
Admission: RE | Admit: 2021-09-24 | Discharge: 2021-09-24 | Disposition: A | Discharge status: Home / Self Care | Payer: Medicare HMO | Source: Skilled Nursing Facility | Attending: Internal Medicine | Admitting: Internal Medicine

## 2021-09-24 DIAGNOSIS — Z741 Need for assistance with personal care: Secondary | ICD-10-CM | POA: Diagnosis not present

## 2021-09-24 DIAGNOSIS — S72001S Fracture of unspecified part of neck of right femur, sequela: Secondary | ICD-10-CM

## 2021-09-24 DIAGNOSIS — E039 Hypothyroidism, unspecified: Secondary | ICD-10-CM | POA: Diagnosis not present

## 2021-09-24 DIAGNOSIS — N183 Chronic kidney disease, stage 3 unspecified: Secondary | ICD-10-CM

## 2021-09-24 DIAGNOSIS — E1169 Type 2 diabetes mellitus with other specified complication: Secondary | ICD-10-CM | POA: Insufficient documentation

## 2021-09-24 DIAGNOSIS — K861 Other chronic pancreatitis: Secondary | ICD-10-CM | POA: Diagnosis not present

## 2021-09-24 DIAGNOSIS — M1A30X0 Chronic gout due to renal impairment, unspecified site, without tophus (tophi): Secondary | ICD-10-CM | POA: Insufficient documentation

## 2021-09-24 DIAGNOSIS — D696 Thrombocytopenia, unspecified: Secondary | ICD-10-CM | POA: Diagnosis not present

## 2021-09-24 DIAGNOSIS — K5909 Other constipation: Secondary | ICD-10-CM | POA: Diagnosis not present

## 2021-09-24 DIAGNOSIS — E1122 Type 2 diabetes mellitus with diabetic chronic kidney disease: Secondary | ICD-10-CM

## 2021-09-24 DIAGNOSIS — E1159 Type 2 diabetes mellitus with other circulatory complications: Secondary | ICD-10-CM | POA: Diagnosis not present

## 2021-09-24 DIAGNOSIS — R2681 Unsteadiness on feet: Secondary | ICD-10-CM | POA: Diagnosis not present

## 2021-09-24 DIAGNOSIS — R69 Illness, unspecified: Secondary | ICD-10-CM | POA: Diagnosis not present

## 2021-09-24 DIAGNOSIS — Z4789 Encounter for other orthopedic aftercare: Secondary | ICD-10-CM | POA: Diagnosis not present

## 2021-09-24 DIAGNOSIS — M6281 Muscle weakness (generalized): Secondary | ICD-10-CM | POA: Diagnosis not present

## 2021-09-24 DIAGNOSIS — E1142 Type 2 diabetes mellitus with diabetic polyneuropathy: Secondary | ICD-10-CM | POA: Diagnosis not present

## 2021-09-24 DIAGNOSIS — I4891 Unspecified atrial fibrillation: Secondary | ICD-10-CM | POA: Insufficient documentation

## 2021-09-24 DIAGNOSIS — N2581 Secondary hyperparathyroidism of renal origin: Secondary | ICD-10-CM | POA: Diagnosis not present

## 2021-09-24 DIAGNOSIS — R600 Localized edema: Secondary | ICD-10-CM | POA: Diagnosis not present

## 2021-09-24 DIAGNOSIS — K219 Gastro-esophageal reflux disease without esophagitis: Secondary | ICD-10-CM | POA: Diagnosis not present

## 2021-09-24 DIAGNOSIS — S72001A Fracture of unspecified part of neck of right femur, initial encounter for closed fracture: Secondary | ICD-10-CM

## 2021-09-24 DIAGNOSIS — Z96612 Presence of left artificial shoulder joint: Secondary | ICD-10-CM | POA: Diagnosis not present

## 2021-09-24 DIAGNOSIS — F339 Major depressive disorder, recurrent, unspecified: Secondary | ICD-10-CM | POA: Diagnosis not present

## 2021-09-24 DIAGNOSIS — I480 Paroxysmal atrial fibrillation: Secondary | ICD-10-CM | POA: Diagnosis not present

## 2021-09-24 DIAGNOSIS — Z9181 History of falling: Secondary | ICD-10-CM | POA: Diagnosis not present

## 2021-09-24 DIAGNOSIS — I152 Hypertension secondary to endocrine disorders: Secondary | ICD-10-CM | POA: Diagnosis not present

## 2021-09-24 DIAGNOSIS — R262 Difficulty in walking, not elsewhere classified: Secondary | ICD-10-CM | POA: Diagnosis not present

## 2021-09-24 DIAGNOSIS — N1831 Chronic kidney disease, stage 3a: Secondary | ICD-10-CM

## 2021-09-24 DIAGNOSIS — S72001D Fracture of unspecified part of neck of right femur, subsequent encounter for closed fracture with routine healing: Secondary | ICD-10-CM | POA: Diagnosis not present

## 2021-09-24 DIAGNOSIS — D649 Anemia, unspecified: Secondary | ICD-10-CM | POA: Diagnosis not present

## 2021-09-24 DIAGNOSIS — M159 Polyosteoarthritis, unspecified: Secondary | ICD-10-CM | POA: Diagnosis not present

## 2021-09-24 DIAGNOSIS — I10 Essential (primary) hypertension: Secondary | ICD-10-CM | POA: Diagnosis not present

## 2021-09-24 DIAGNOSIS — E1129 Type 2 diabetes mellitus with other diabetic kidney complication: Secondary | ICD-10-CM | POA: Diagnosis not present

## 2021-09-24 DIAGNOSIS — E876 Hypokalemia: Secondary | ICD-10-CM | POA: Diagnosis not present

## 2021-09-24 DIAGNOSIS — E785 Hyperlipidemia, unspecified: Secondary | ICD-10-CM

## 2021-09-24 DIAGNOSIS — Z5181 Encounter for therapeutic drug level monitoring: Secondary | ICD-10-CM

## 2021-09-24 DIAGNOSIS — Z961 Presence of intraocular lens: Secondary | ICD-10-CM | POA: Diagnosis not present

## 2021-09-24 DIAGNOSIS — I69098 Other sequelae following nontraumatic subarachnoid hemorrhage: Secondary | ICD-10-CM | POA: Diagnosis not present

## 2021-09-24 DIAGNOSIS — I639 Cerebral infarction, unspecified: Secondary | ICD-10-CM | POA: Diagnosis not present

## 2021-09-24 DIAGNOSIS — N189 Chronic kidney disease, unspecified: Secondary | ICD-10-CM | POA: Diagnosis not present

## 2021-09-24 DIAGNOSIS — Z7901 Long term (current) use of anticoagulants: Secondary | ICD-10-CM

## 2021-09-24 DIAGNOSIS — Z950 Presence of cardiac pacemaker: Secondary | ICD-10-CM | POA: Diagnosis not present

## 2021-09-24 DIAGNOSIS — Z79899 Other long term (current) drug therapy: Secondary | ICD-10-CM | POA: Insufficient documentation

## 2021-09-24 LAB — PROTIME-INR
INR: 1 (ref 0.8–1.2)
Prothrombin Time: 13.4 seconds (ref 11.4–15.2)

## 2021-09-24 MED ORDER — HYDROCODONE-ACETAMINOPHEN 7.5-325 MG PO TABS
1.0000 | ORAL_TABLET | Freq: Three times a day (TID) | ORAL | 0 refills | Status: DC | PRN
Start: 2021-09-24 — End: 2021-09-29

## 2021-09-24 NOTE — Progress Notes (Signed)
?Location:  [facility] ?Nursing Home Room Number: 131-P ?Place of Service:  SNF (31) ? ? ?CODE STATUS: Full Code ? ?Allergies  ?Allergen Reactions  ? Fiorinal [Butalbital-Aspirin-Caffeine] Hives  ? Penicillins Rash  ? Tetanus Toxoids Rash  ? ? ?Chief Complaint  ?Patient presents with  ? Hospitalization Follow-up  ?  Follow-up from recent hospital stay 09/19/21-09/23/21  ? ? ?HPI: ? ?She is a 81 year old woman who has been hospitalized from 09-19-21 through 09-23-21. She had a fall after turning her head to look at someone; she fell on her right side and hit her head. She had pain in her right side and presented to the ED. She was found to have a right hip fracture. She had a right hip pinning on 09-20-21. She is here for short term rehab. She states that her pain is being adequately managed. She denies any constipation. She denies any change in her appetite. 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 ?Past Medical History:  ?Diagnosis Date  ? A-fib ([place])   ? 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 Chronic kidney disease   ? Colon polyps   ? CVA (cerebral vascular accident) [facility])   ? left sided weakness. Has 40% weakness of left arm  ? Depression   ? DM (diabetes mellitus) ([place])   ? GERD (gastroesophageal reflux disease)   ? HTN (hypertension)   ? Hypercholesterolemia   ? Hypothyroidism   ? Pancreatitis   ? biliary  ? S/P colonoscopy 07/2004  ? Dr. Olevia Perches: per e-chart report: diverticulosis, path adenomatous polyps  ? S/P endoscopy 07/2007  ? multiple 3-4 mmsessile gastric polyps, benign, singl antral erosions, negative H.pylori, reactive gastropathy ? NSAIDs  ? Symptomatic bradycardia   ? 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 ? ?Past Surgical History:  ?Procedure Laterality Date  ? CATARACT EXTRACTION W/PHACO  Left 04/07/2021  ? Procedure: CATARACT EXTRACTION PHACO AND INTRAOCULAR LENS PLACEMENT LEFT EYE;  Surgeon: Baruch Goldmann, MD;  Location: [facility];  Service: Ophthalmology;  Laterality: Left;  left ?CDE=11.11  ? CATARACT EXTRACTION W/PHACO Right 04/21/2021  ? Procedure: CATARACT EXTRACTION PHACO AND INTRAOCULAR LENS PLACEMENT RIGHT EYE;  Surgeon: Baruch Goldmann, MD;  Location: [facility];  Service: Ophthalmology;  Laterality: Right;  right ?CDE=10.12  ? CHOLECYSTECTOMY    ? COLONOSCOPY  03/10/2011  ? Procedure: COLONOSCOPY;  Surgeon: Daneil Dolin, MD;  Location: [facility];  Service: Endoscopy;  Laterality: N/A;  12:45  ? COLONOSCOPY WITH PROPOFOL N/A 07/22/2020  ? Procedure: COLONOSCOPY WITH PROPOFOL;  Surgeon: Daneil Dolin, MD;  [facility];  Service: Endoscopy;  Laterality: N/A;  10:30am  ? HIP PINNING,CANNULATED Right 09/20/2021  ? Procedure: CANNULATED HIP PINNING;  Surgeon: Carole Civil, MD;  Location: [facility];  Service: Orthopedics;  Laterality: Right;  ? PACEMAKER INSERTION  02/05/2012  ? SJM Accent DR RF implanted by Dr [name] for symptomatic bradycardia  ? PERMANENT PACEMAKER INSERTION N/A 02/05/2012  ? Procedure: PERMANENT PACEMAKER INSERTION;  Surgeon: Thompson Grayer, MD;  Location[facility] CATH LAB;  Service: Cardiovascular;  Laterality: N/A;  ? POLYPECTOMY  07/22/2020  ? Procedure: POLYPECTOMY;  Surgeon: Daneil Dolin, MD;  [facility]: [facility];  Service: Endoscopy;;  ? SHOULDER SURGERY  2008  ? left shoulder replacement due to fracture   ?  SKIN CANCER EXCISION Left   ? Skin cancer on left side of face.  ? ? ?Social History  ? ?Socioeconomic History  ? Marital status: Legally Separated  ?  Spouse name: Not on file  ? Number of children: Not on file  ? Years of education: Not on file  ? Highest education level: Not on file  ?Occupational History  ? Not on file  ?Tobacco Use  ? Smoking status: Never  ? Smokeless tobacco: Never  ?Vaping Use  ? Vaping Use: Never used  ?Substance and Sexual  Activity  ? Alcohol use: No  ? Drug use: No  ? Sexual activity: Not Currently  ?Other Topics Concern  ? Not on file  ?Social History Narrative  ? Not on file  ? ?Social Determinants of Health  ? ?Financial Resource Strain: Not on file  ?Food Insecurity: Not on file  ?Transportation Needs: Not on file  ?Physical Activity: Not on file  ?Stress: Not on file  ?Social Connections: Not on file  ?Intimate Partner Violence: Not on file  ? ?Family History  ?Problem Relation Age of Onset  ? Heart disease Other   ? Arthritis Other   ? Lung disease Other   ? Diabetes Other   ? Kidney disease Other   ? Cancer Mother   ? Heart disease Father   ? Colon cancer Neg Hx   ? ? ? ? ?VITAL SIGNS ?BP 137/90   Pulse 94   Temp 98.4 ?F (36.9 ?C)   Resp (!) 22   Ht '5\' 5"'$  (1.651 m)   Wt 171 lb (77.6 kg)   SpO2 96%   BMI 28.46 kg/m?  ? ?Outpatient Encounter Medications as of 09/24/2021  ?Medication Sig  ? acetaminophen (TYLENOL) 325 MG tablet Take 650 mg by mouth every 6 (six) hours as needed.  ? albuterol (PROVENTIL) (2.5 MG/3ML) 0.083% nebulizer solution Take 2.5 mg by nebulization every 4 (four) hours as needed for wheezing or shortness of breath. Every 4-6 hours as needed  ? albuterol (VENTOLIN HFA) 108 (90 Base) MCG/ACT inhaler Inhale 2 puffs into the lungs every 6 (six) hours as needed for wheezing or shortness of breath.  ? allopurinol (ZYLOPRIM) 100 MG tablet Take 200 mg by mouth daily.  ? atorvastatin (LIPITOR) 20 MG tablet Take 1 tablet by mouth daily.  ? Benzocaine-Menthol (SORE THROAT) 15-3.6 MG LOZG Use as directed 1 lozenge in the mouth or throat See admin instructions. Every Shift; Day, Evening, Night  ? calcitRIOL (ROCALTROL) 0.25 MCG capsule Take 0.25-0.5 mcg by mouth See admin instructions. Take 2 tablets by mouth every other day and take 1 tablet daily on the other days  ? Cholecalciferol (VITAMIN D3) 50 MCG (2000 UT) capsule Take 2,000 Units by mouth daily.  ? citalopram (CELEXA) 20 MG tablet Take 20 mg by mouth daily.   ? DHA-EPA-Flaxseed Oil-Vitamin E CAPS Take 1 capsule by mouth daily.  ? docusate sodium (COLACE) 100 MG capsule Take 1 capsule (100 mg total) by mouth 2 (two) times daily.  ? flecainide (TAMBOCOR) 100 MG tablet Take 1 tablet (100 mg total) by mouth 2 (two) times daily.  ? furosemide (LASIX) 80 MG tablet Take 2 tablets (160 mg total) by mouth 2 (two) times daily.  ? gabapentin (NEURONTIN) 100 MG capsule Take 1 capsule (100 mg total) by mouth 3 (three) times daily.  ? levothyroxine (SYNTHROID, LEVOTHROID) 75 MCG tablet Take 75 mcg by mouth daily before breakfast.  ? traMADol (ULTRAM) 50 MG tablet Take  1 tablet (50 mg total) by mouth every 6 (six) hours.  ? UNABLE TO FIND Diet: Regular  ? vitamin B-12 (CYANOCOBALAMIN) 1000 MCG tablet Take 5,000 mcg by mouth daily.  ? warfarin (COUMADIN) 5 MG tablet Take 5 mg by mouth at bedtime.  ?  HYDROcodone-acetaminophen (NORCO) 7.5-325 MG tablet Take 1 tablet by mouth 3 (three) times daily as needed for severe pain.  ? ?No facility-administered encounter medications on file as of 09/24/2021.  ? ? ? ?SIGNIFICANT DIAGNOSTIC EXAMS ? ?TODAY ? ?09-19-21: ct of head:  ?1. No acute intracranial abnormality. ?2. Remote right MCA territory infarction. ? ?09-19-21: right hip x-ray: Impacted right femoral neck fracture.  ? ?09-19-21: chest x-ray: 1. No acute abnormality. 2. Chronic elevation of left hemidiaphragm. ? ?LABS REVIEWED TODAY;  ? ?09-19-21: wbc 5.8; hgb 11.9; hct 36.5; mcv 98.4 plt 131; glucose 134; bun 25; creat 1.30; k+ 2.8; na++ 140; ca 9.0; GFR 42 ?09-20-21: wbc 8.5; hgb 11.5; hct 34.9; mcv 98.6 plt 114; glucose 164; bun 24; creat 1.27; k+ 4.1; na++ 143; ca 8.9; GFR 43; protein 6.1; albumin 3.5; mag 1.8 ?09-23-21: wbc 7.0; hgb 10.4; hct 32.3; mcv 100.3 plt 100; INT 1.1 ?09-24-21: INR 1.0  ? ?Review of Systems  ?Constitutional:  Negative for malaise/fatigue.  ?Respiratory:  Negative for cough and shortness of breath.   ?Cardiovascular:  Negative for chest pain, palpitations and leg  swelling.  ?Gastrointestinal:  Negative for abdominal pain, constipation and heartburn.  ?Musculoskeletal:  Positive for joint pain. Negative for back pain and myalgias.  ?     Right hip pain managed   ?Skin: Neg

## 2021-09-25 ENCOUNTER — Telehealth: Payer: Self-pay | Admitting: Orthopedic Surgery

## 2021-09-25 ENCOUNTER — Non-Acute Institutional Stay (SKILLED_NURSING_FACILITY): Payer: Medicare HMO | Admitting: Internal Medicine

## 2021-09-25 ENCOUNTER — Encounter: Payer: Self-pay | Admitting: Internal Medicine

## 2021-09-25 DIAGNOSIS — E1122 Type 2 diabetes mellitus with diabetic chronic kidney disease: Secondary | ICD-10-CM | POA: Diagnosis not present

## 2021-09-25 DIAGNOSIS — S72001S Fracture of unspecified part of neck of right femur, sequela: Secondary | ICD-10-CM

## 2021-09-25 DIAGNOSIS — N183 Chronic kidney disease, stage 3 unspecified: Secondary | ICD-10-CM | POA: Diagnosis not present

## 2021-09-25 DIAGNOSIS — E876 Hypokalemia: Secondary | ICD-10-CM | POA: Diagnosis not present

## 2021-09-25 DIAGNOSIS — D649 Anemia, unspecified: Secondary | ICD-10-CM | POA: Diagnosis not present

## 2021-09-25 DIAGNOSIS — I10 Essential (primary) hypertension: Secondary | ICD-10-CM

## 2021-09-25 NOTE — Assessment & Plan Note (Addendum)
No cervical spine images in Epic.  No carotid bruits on physical exam.  PCP can pursue any possible neurovascular etiology following discharge from rehab. ?Presently she describes more pain in the right knee which is a chronic issue than at the operative site.  The knee has been injected with cortisone previously by Dr. Aline Brochure. Topical Voltaren gel will be initiated. ?

## 2021-09-25 NOTE — Assessment & Plan Note (Signed)
Current creatinine 1.24 with GFR of 44 indicating CKD high stage IIIb.  No changes in medications indicated unless there is progression of CKD. ?

## 2021-09-25 NOTE — Progress Notes (Signed)
? ?NURSING HOME LOCATION: [place] ?ROOM NUMBER:  131 P ? ?CODE STATUS:  Full ? ?PCP:  Celene Squibb MD ? ?This is a comprehensive admission note to this SNFperformed on this date less than 30 days from date of admission. ?Included are preadmission medical/surgical history; reconciled medication list; family history; social history and comprehensive review of systems.  ?Corrections and additions to the records were documented. Comprehensive physical exam was also performed. Additionally a clinical summary was entered for each active diagnosis pertinent to this admission in the Problem List to enhance continuity of care. ? ?HPI: The patient was hospitalized 4/28 - 09/23/2021 for closed fracture of the right femoral neck sustained in a fall.  She had actually had 2 falls during the week PTA.  The first was at church.  She states she turned her head and became unbalanced and fell.  Fortunately another church member grabbed her and prevented her from hitting the ground.  The second fall occurred when she turned her head to look at someone while walking on road and became unbalanced again and fell sustaining the fracture. ?She states that this phenomena has been present approximately 6-12 months and occurs when she turns her head suddenly, even while driving.  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  She describes associated stiffness which prevents her from extending her neck even to drink from a bottle.  She states she must drop her chin before turning her head laterally. She does have a remote history of vertigo but these episodes were not vertiginous in character.  She denies any other neurologic or cardiac prodrome prior to the fall even though she does have a significant cardiac history. ?Dr. Arther Abbott completed ORIF without complication. ?At presentation potassium was 2.8 which was attributed to a GI bug in the 2 weeks PTA. (See ROS below).  Potassium was repleted and was 3.5  prior to discharge.  Pre-ORIF H/H was 11.9/36.5.  At discharge values were 10.3/33.  She has a history of CKD stage III in the context of diabetes.  Current creatinine 1.24 with a GFR 44 indicating CKD high stage IIIb. Diabetes control was felt to be excellent as A1c was 6.3%.  As an inpatient glucoses ranged from 134-200. ?She was discharged to the SNF for rehab. ? ?Past medical and surgical history: Includes history of A-fib, history of colon polyps, history of stroke, history of depression, diabetes with CKD, GERD, essential hypertension, dyslipidemia, hypothyroidism, history of pancreatitis, and history of DVT. ?Surgery and procedures include cholecystectomy, colonoscopy with polypectomy, history of secondary hyperparathyroidism, history of gout, pacemaker insertion, shoulder surgery, and skin cancer excision. ? ?Social history: Nondrinker; non-smoker. ? ?Family history: Extensive history reviewed. ?  ?Review of systems: The patient did have a GI virus approximately 2 weeks PTA associated with nausea, vomiting, diarrhea.  Since that time she has had decreased appetite. ?She continues to have pain in the right knee for which she has had cortisone injections by Dr. Aline Brochure.  The right knee pain is actually greater than the pain in the hip on which she has had surgery.  The pain in the knee is worse with weightbearing. ?She describes occasional dysphagia. ? ?Constitutional: No fever, significant weight change, fatigue  ?Eyes: No redness, discharge, pain, vision change ?ENT/mouth: No nasal congestion, purulent discharge, earache, change in hearing, sore throat  ?Cardiovascular: No chest pain, palpitations, paroxysmal nocturnal dyspnea, claudication, edema  ?Respiratory: No cough, sputum production, hemoptysis, DOE, significant snoring, apnea  ?  Gastrointestinal: No heartburn, abdominal pain, rectal bleeding, melena ?Genitourinary: No dysuria, hematuria, pyuria, incontinence, nocturia ?Dermatologic: No rash,  pruritus, change in appearance of skin ?Neurologic: No dizziness, headache, syncope, seizures, numbness, tingling ?Psychiatric: No significant anxiety, depression, insomnia ?Endocrine: No change in hair/skin/nails, excessive thirst, excessive hunger, excessive urination  ?Hematologic/lymphatic: No significant bruising, lymphadenopathy, abnormal bleeding ?Allergy/immunology: No itchy/watery eyes, significant sneezing, urticaria, angioedema ? ?Physical exam:  ?Pertinent or positive findings: When I entered the room she was asleep; she exhibited no hypopnea or apnea.  Upon awakening she was alert and oriented.  She has bilateral ptosis.  The left lacrimal gland is somewhat prominent.  She has complete dentures.  Second heart sound is slightly accentuated.  Pedal pulses are decreased.  Trace edema at the sock line.  Interosseous wasting of the hands is present.  She has DIP OA changes.  There is slight fusiform change of the right knee without definite effusion.  The knee is tender to palpation.  There is irregular hyperpigmentation over the shins.  There is a dressing over the right hip. ? ?General appearance: Adequately nourished; no acute distress, increased work of breathing is present.   ?Lymphatic: No lymphadenopathy about the head, neck, axilla. ?Eyes: No conjunctival inflammation or lid edema is present. There is no scleral icterus. ?Ears:  External ear exam shows no significant lesions or deformities.   ?Nose:  External nasal examination shows no deformity or inflammation. Nasal mucosa are pink and moist without lesions, exudates ?Oral exam:There is no oropharyngeal erythema or exudate. ?Neck:  No thyromegaly, masses, tenderness noted.    ?Heart:  Normal rate and regular rhythm. S1  normal without gallop, murmur, click, rub.  ?Lungs: Chest clear to auscultation without wheezes, rhonchi, rales, rubs. ?Abdomen: Bowel sounds are normal.  Abdomen is soft and nontender with no organomegaly, hernias, masses. ?GU:  Deferred  ?Extremities:  No cyanosis, clubbing. ?Neurologic exam:  Balance, Rhomberg, finger to nose testing could not be completed due to clinical state ?Skin: Warm & dry w/o tenting. ?No significant rash. ? ?See clinical summary under each active problem in the Problem List with associated updated therapeutic plan ? ?

## 2021-09-25 NOTE — Assessment & Plan Note (Signed)
Potassium 3.5 at discharge.  Monitor at SNF. ?

## 2021-09-25 NOTE — Assessment & Plan Note (Signed)
PE ORIF H/H 11.9/36.5; at discharge values 10.3/33.  This is in the context of warfarin therapy for AF.  No bleeding dyscrasias documented at SNF.  Monitor CBC at SNF. ?

## 2021-09-25 NOTE — Patient Instructions (Signed)
See assessment and plan under each diagnosis in the problem list and acutely for this visit 

## 2021-09-25 NOTE — Telephone Encounter (Signed)
Patient called to ask about her follow up post op visit for right hip, surgery date 09/20/21. Notes indicate day#13, or 10/03/21 - Dr is out of clinic Friday, 5/112/23. Please advise regarding scheduling for Xray/staple or suture removal. ?Also, patient would like to know if she may have a cortisone injection in her right knee, as she is having difficulty ambulating. Please advise. ?

## 2021-09-26 NOTE — Telephone Encounter (Signed)
Patient is scheduled for this date; aware. I have left message to confirm. ?

## 2021-09-26 NOTE — Assessment & Plan Note (Signed)
Elevated pressure is in context of active R knee pain. Adjust antihypertensives if HTN persists after pain control. ?

## 2021-09-29 ENCOUNTER — Other Ambulatory Visit: Payer: Self-pay | Admitting: Adult Health

## 2021-09-29 ENCOUNTER — Encounter (HOSPITAL_COMMUNITY)
Admission: RE | Admit: 2021-09-29 | Discharge: 2021-09-29 | Disposition: A | Discharge status: Home / Self Care | Payer: Medicare HMO | Source: Skilled Nursing Facility | Attending: Adult Health | Admitting: Adult Health

## 2021-09-29 ENCOUNTER — Non-Acute Institutional Stay (SKILLED_NURSING_FACILITY): Payer: Medicare HMO | Admitting: Adult Health

## 2021-09-29 ENCOUNTER — Encounter: Payer: Self-pay | Admitting: Adult Health

## 2021-09-29 DIAGNOSIS — N183 Chronic kidney disease, stage 3 unspecified: Secondary | ICD-10-CM

## 2021-09-29 DIAGNOSIS — I480 Paroxysmal atrial fibrillation: Secondary | ICD-10-CM | POA: Diagnosis not present

## 2021-09-29 DIAGNOSIS — S72001S Fracture of unspecified part of neck of right femur, sequela: Secondary | ICD-10-CM

## 2021-09-29 DIAGNOSIS — E1122 Type 2 diabetes mellitus with diabetic chronic kidney disease: Secondary | ICD-10-CM | POA: Diagnosis not present

## 2021-09-29 DIAGNOSIS — I4891 Unspecified atrial fibrillation: Secondary | ICD-10-CM | POA: Insufficient documentation

## 2021-09-29 LAB — PROTIME-INR
INR: 1.4 — ABNORMAL HIGH (ref 0.8–1.2)
Prothrombin Time: 17.2 seconds — ABNORMAL HIGH (ref 11.4–15.2)

## 2021-09-29 MED ORDER — HYDROCODONE-ACETAMINOPHEN 7.5-325 MG PO TABS
1.0000 | ORAL_TABLET | Freq: Three times a day (TID) | ORAL | 0 refills | Status: AC | PRN
Start: 2021-09-29 — End: 2021-10-04

## 2021-09-29 MED ORDER — GABAPENTIN 100 MG PO CAPS: 100.0000 mg | ORAL_CAPSULE | Freq: Three times a day (TID) | ORAL | 0 refills | Status: DC

## 2021-09-29 MED ORDER — FUROSEMIDE 80 MG PO TABS
160.0000 mg | ORAL_TABLET | Freq: Every day | ORAL | 0 refills | Status: AC
Start: 2021-09-29 — End: ?

## 2021-09-29 MED ORDER — CALCITRIOL 0.5 MCG PO CAPS: 0.5000 ug | ORAL_CAPSULE | Freq: Every day | ORAL | 0 refills | Status: AC

## 2021-09-29 MED ORDER — TRAMADOL HCL 50 MG PO TABS: 50.0000 mg | ORAL_TABLET | Freq: Four times a day (QID) | ORAL | 0 refills | Status: DC

## 2021-09-29 MED ORDER — FLECAINIDE ACETATE 100 MG PO TABS: 100.0000 mg | ORAL_TABLET | Freq: Two times a day (BID) | ORAL | 0 refills | Status: AC

## 2021-09-29 MED ORDER — ALBUTEROL SULFATE HFA 108 (90 BASE) MCG/ACT IN AERS: 2.0000 | INHALATION_SPRAY | Freq: Four times a day (QID) | RESPIRATORY_TRACT | 0 refills | Status: AC | PRN

## 2021-09-29 MED ORDER — LEVOTHYROXINE SODIUM 75 MCG PO TABS: 75.0000 ug | ORAL_TABLET | Freq: Every day | ORAL | 0 refills | Status: AC

## 2021-09-29 MED ORDER — CITALOPRAM HYDROBROMIDE 20 MG PO TABS: 20.0000 mg | ORAL_TABLET | Freq: Every day | ORAL | 0 refills | Status: AC

## 2021-09-29 MED ORDER — ATORVASTATIN CALCIUM 20 MG PO TABS: 20.0000 mg | ORAL_TABLET | Freq: Every day | ORAL | 0 refills | Status: AC

## 2021-09-29 MED ORDER — WARFARIN SODIUM 6 MG PO TABS: 6.0000 mg | ORAL_TABLET | Freq: Every day | ORAL | 0 refills | Status: DC

## 2021-09-29 MED ORDER — ALLOPURINOL 100 MG PO TABS: 200.0000 mg | ORAL_TABLET | Freq: Every day | ORAL | 0 refills | Status: AC

## 2021-09-29 NOTE — Progress Notes (Addendum)
? ?Location:  [facility] ?Nursing Home Room Number: 131-P ?Place of Service:  SNF (31) ? ?Provider: Ok Edwards np  ? ?PCP: Celene Squibb, MD ?Patient Care Team: ?Celene Squibb, MD as PCP - General (Internal Medicine) ?Thompson Grayer, MD as PCP - Electrophysiology (Cardiology) ?Rourk, Cristopher Estimable, MD (Gastroenterology) ? ?Extended Emergency Contact Information ?Primary Emergency Contact: Maestre,Marvin ?Address: [address] ?         [place], [place] ?Home Phone: [phone] ?Relation: Son ?Secondary Emergency Contact: Hill,Brenda ?Address: [place], [place] ?Home Phone: [phone] ?Mobile Phone: [phone] ?Relation: Sister ? ?Code Status: full code  ?Goals of care:  Advanced Directive information ? ?  09/29/2021  ? 10:52 AM  ?Advanced Directives  ?Does Patient Have a Medical Advance Directive? No  ?Does patient want to make changes to medical advance directive? No - Patient declined  ? ? ? ?Allergies  ?Allergen Reactions  ? Fiorinal [Butalbital-Aspirin-Caffeine] Hives  ? Penicillins Rash  ? Tetanus Toxoids Rash  ? ? ?Chief Complaint  ?Patient presents with  ? Discharge Note  ? ? ?HPI:  ?81 y.o. female  being discharged to home with home health for pt. She will need a rolling walker and will need her prescriptions written. She will need to follow up with her medical provider. She had been hospitalized for a right hip fracture on 09-20-21: pinning performed. She was admitted to this facility for short term rehab. She has participated in pt/ot and is now ready to complete therapy on a home health basis.  ? ?Past Medical History:  ?Diagnosis Date  ? A-fib ([place])   ? 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 Chronic kidney disease   ? Colon polyps   ? CVA (cerebral vascular accident) [facility])   ? left sided weakness. Has 40% weakness of left arm  ? Depression   ? DM (diabetes mellitus) ([place])   ? GERD (gastroesophageal reflux disease)   ? HTN (hypertension)    ? Hypercholesterolemia   ? Hypothyroidism   ? Pancreatitis   ? biliary  ? S/P colonoscopy 07/2004  ? Dr. Olevia Perches: per e-chart report: diverticulosis, path adenomatous polyps  ? S/P endoscopy 07/2007  ? multiple 3-4 mmsessile gastric polyps, benign, singl antral erosions, negative H.pylori, reactive gastropathy ? NSAIDs  ? Symptomatic bradycardia   ? 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 ? ?Past Surgical History:  ?Procedure Laterality Date  ? CATARACT EXTRACTION W/PHACO Left 04/07/2021  ? Procedure: CATARACT EXTRACTION PHACO AND INTRAOCULAR LENS PLACEMENT LEFT EYE;  Surgeon: Baruch Goldmann, MD;  Location: [facility];  Service: Ophthalmology;  Laterality: Left;  left ?CDE=11.11  ? CATARACT EXTRACTION W/PHACO Right 04/21/2021  ? Procedure: CATARACT EXTRACTION PHACO AND INTRAOCULAR LENS PLACEMENT RIGHT EYE;  Surgeon: Baruch Goldmann, MD;  Location: [facility];  Service: Ophthalmology;  Laterality: Right;  right ?CDE=10.12  ? CHOLECYSTECTOMY    ? COLONOSCOPY  03/10/2011  ? Procedure: COLONOSCOPY;  Surgeon: Daneil Dolin, MD;  Location: [facility];  Service: Endoscopy;  Laterality: N/A;  12:45  ? COLONOSCOPY WITH PROPOFOL N/A 07/22/2020  ? Procedure: COLONOSCOPY WITH PROPOFOL;  Surgeon: Daneil Dolin, MD;  [facility];  Service: Endoscopy;  Laterality: N/A;  10:30am  ? HIP PINNING,CANNULATED Right 09/20/2021  ? Procedure: CANNULATED HIP PINNING;  Surgeon: Carole Civil, MD;  Location: [facility];  Service: Orthopedics;  Laterality:  Right;  ? PACEMAKER INSERTION  02/05/2012  ? SJM Accent DR RF implanted by Dr [name] for symptomatic bradycardia  ? PERMANENT PACEMAKER INSERTION N/A 02/05/2012  ? Procedure: PERMANENT PACEMAKER INSERTION;  Surgeon: Thompson Grayer, MD;  Location: [facility] CATH LAB;  Service: Cardiovascular;  Laterality: N/A;  ? POLYPECTOMY  07/22/2020  ? Procedure: POLYPECTOMY;  Surgeon: Daneil Dolin, MD;  Location: [facility];  Service: Endoscopy;;  ? SHOULDER SURGERY  2008  ? left shoulder  replacement due to fracture   ? SKIN CANCER EXCISION Left   ? Skin cancer on left side of face.  ? ? ?  reports that she has never smoked. She has never used smokeless tobacco. She reports that she does not drink alcohol and does not use drugs. ?Social History  ? ?Socioeconomic History  ? Marital status: Legally Separated  ?  Spouse name: Not on file  ? Number of children: Not on file  ? Years of education: Not on file  ? Highest education level: Not on file  ?Occupational History  ? Not on file  ?Tobacco Use  ? Smoking status: Never  ? Smokeless tobacco: Never  ?Vaping Use  ? Vaping Use: Never used  ?Substance and Sexual Activity  ? Alcohol use: No  ? Drug use: No  ? Sexual activity: Not Currently  ?Other Topics Concern  ? Not on file  ?Social History Narrative  ? Not on file  ? ?Social Determinants of Health  ? ?Financial Resource Strain: Not on file  ?Food Insecurity: Not on file  ?Transportation Needs: Not on file  ?Physical Activity: Not on file  ?Stress: Not on file  ?Social Connections: Not on file  ?Intimate Partner Violence: Not on file  ? ?Functional Status Survey: ?  ? ?Allergies  ?Allergen Reactions  ? Fiorinal [Butalbital-Aspirin-Caffeine] Hives  ? Penicillins Rash  ? Tetanus Toxoids Rash  ? ? ?Pertinent  Health Maintenance Due  ?Topic Date Due  ? FOOT EXAM  Never done  ? OPHTHALMOLOGY EXAM  Never done  ? URINE MICROALBUMIN  Never done  ? INFLUENZA VACCINE  12/23/2021  ? HEMOGLOBIN A1C  03/21/2022  ? DEXA SCAN  Completed  ? ? ?Medications: ?Outpatient Encounter Medications as of 09/29/2021  ?Medication Sig  ? acetaminophen (TYLENOL) 325 MG tablet Take 650 mg by mouth every 6 (six) hours as needed.  ? albuterol (PROVENTIL) (2.5 MG/3ML) 0.083% nebulizer solution Take 2.5 mg by nebulization every 4 (four) hours as needed for wheezing or shortness of breath. Every 4-6 hours as needed  ? albuterol (VENTOLIN HFA) 108 (90 Base) MCG/ACT inhaler Inhale 2 puffs into the lungs every 6 (six) hours as needed for  wheezing or shortness of breath.  ? allopurinol (ZYLOPRIM) 100 MG tablet Take 200 mg by mouth daily.  ? atorvastatin (LIPITOR) 20 MG tablet Take 1 tablet by mouth daily.  ? Benzocaine-Menthol (SORE THROAT) 15-3.6 MG LOZG Use as directed 1 lozenge in the mouth or throat See admin instructions. Every Shift; Day, Evening, Night  ? CALCITRIOL PO Take by mouth. 0.25 mcg; amt: 1 tab; oral Once A Day Every Other Day ? 0.5 mcg; oral,Once A Day Every Other Day  ? Cholecalciferol (VITAMIN D3) 50 MCG (2000 UT) capsule Take 2,000 Units by mouth daily.  ? citalopram (CELEXA) 20 MG tablet Take 20 mg by mouth daily.  ? DHA-EPA-Flaxseed Oil-Vitamin E CAPS Take 1 capsule by mouth daily.  ? diclofenac Sodium (VOLTAREN) 1 % GEL 2 mg; topical ?Special Instructions: apply  to bilateral knees for pain management ?Three Times A Day  ? docusate sodium (COLACE) 100 MG capsule Take 1 capsule (100 mg total) by mouth 2 (two) times daily.  ? flecainide (TAMBOCOR) 100 MG tablet Take 1 tablet (100 mg total) by mouth 2 (two) times daily.  ? furosemide (LASIX) 80 MG tablet Take 2 tablets (160 mg total) by mouth 2 (two) times daily. (Patient taking differently: Take 160 mg by mouth daily.)  ? gabapentin (NEURONTIN) 100 MG capsule Take 1 capsule (100 mg total) by mouth 3 (three) times daily.  ? HYDROcodone-acetaminophen (NORCO) 7.5-325 MG tablet Take 1 tablet by mouth 3 (three) times daily as needed for up to 5 days for severe pain.  ? levothyroxine (SYNTHROID, LEVOTHROID) 75 MCG tablet Take 75 mcg by mouth daily before breakfast.  ? traMADol (ULTRAM) 50 MG tablet Take 1 tablet (50 mg total) by mouth every 6 (six) hours.  ? UNABLE TO FIND Diet: Regular  ? vitamin B-12 (CYANOCOBALAMIN) 1000 MCG tablet Take 5,000 mcg by mouth daily.  ? warfarin (COUMADIN) 6 MG tablet Take 6 mg by mouth at bedtime.  ? [DISCONTINUED] calcitRIOL (ROCALTROL) 0.25 MCG capsule Take 0.25 mcg by mouth See admin instructions. Take 2 tablets by mouth every other day and take 1  tablet daily on the other days  ? [DISCONTINUED] warfarin (COUMADIN) 5 MG tablet Take 5 mg by mouth at bedtime.  ? ?No facility-administered encounter medications on file as of 09/29/2021.  ? ? ? ?Vitals:  ? 09/29/21

## 2021-10-01 NOTE — Progress Notes (Signed)
Remote pacemaker transmission.   

## 2021-10-02 ENCOUNTER — Ambulatory Visit (INDEPENDENT_AMBULATORY_CARE_PROVIDER_SITE_OTHER): Payer: Medicare HMO | Admitting: Orthopedic Surgery

## 2021-10-02 DIAGNOSIS — Z4889 Encounter for other specified surgical aftercare: Secondary | ICD-10-CM

## 2021-10-02 DIAGNOSIS — S72001A Fracture of unspecified part of neck of right femur, initial encounter for closed fracture: Secondary | ICD-10-CM

## 2021-10-02 NOTE — Patient Instructions (Signed)
Follow up in 4 weeks w/ xray  ?

## 2021-10-02 NOTE — Progress Notes (Signed)
Chief Complaint  ?Patient presents with  ? Follow-up  ?  Recheck on right hip, DOS 09-20-21.  ? ?Encounter Diagnoses  ?Name Primary?  ? Closed displaced fracture of right femoral neck ([place]) Yes  ? Aftercare following surgery   ? ?81 year old female postop day #12 status post open treatment internal fixation right hip with 3 cannulated screws ? ?She is doing well walking with a walker she is back on her standard warfarin which will also be used for DVT prophylaxis ? ?We took her staples out today.  She is weight-bear as tolerated with a walker ? ?We will take an x-ray in 4 weeks ? ?Her incision looks good.  We placed Steri-Strips and a bandage which she can take off in 24 hours and just leave the Steri-Strips.  Peel off the Steri-Strips in 7 days ?

## 2021-10-03 DIAGNOSIS — N183 Chronic kidney disease, stage 3 unspecified: Secondary | ICD-10-CM | POA: Diagnosis not present

## 2021-10-03 DIAGNOSIS — E1122 Type 2 diabetes mellitus with diabetic chronic kidney disease: Secondary | ICD-10-CM | POA: Diagnosis not present

## 2021-10-03 DIAGNOSIS — I10 Essential (primary) hypertension: Secondary | ICD-10-CM | POA: Diagnosis not present

## 2021-10-03 DIAGNOSIS — S72001D Fracture of unspecified part of neck of right femur, subsequent encounter for closed fracture with routine healing: Secondary | ICD-10-CM | POA: Diagnosis not present

## 2021-10-03 DIAGNOSIS — D62 Acute posthemorrhagic anemia: Secondary | ICD-10-CM | POA: Diagnosis not present

## 2021-10-03 DIAGNOSIS — K219 Gastro-esophageal reflux disease without esophagitis: Secondary | ICD-10-CM | POA: Diagnosis not present

## 2021-10-03 DIAGNOSIS — Z86718 Personal history of other venous thrombosis and embolism: Secondary | ICD-10-CM | POA: Diagnosis not present

## 2021-10-03 DIAGNOSIS — Z8673 Personal history of transient ischemic attack (TIA), and cerebral infarction without residual deficits: Secondary | ICD-10-CM | POA: Diagnosis not present

## 2021-10-03 DIAGNOSIS — Z950 Presence of cardiac pacemaker: Secondary | ICD-10-CM | POA: Diagnosis not present

## 2021-10-03 DIAGNOSIS — M1030 Gout due to renal impairment, unspecified site: Secondary | ICD-10-CM | POA: Diagnosis not present

## 2021-10-03 DIAGNOSIS — R69 Illness, unspecified: Secondary | ICD-10-CM | POA: Diagnosis not present

## 2021-10-03 DIAGNOSIS — Z9181 History of falling: Secondary | ICD-10-CM | POA: Diagnosis not present

## 2021-10-03 DIAGNOSIS — E039 Hypothyroidism, unspecified: Secondary | ICD-10-CM | POA: Diagnosis not present

## 2021-10-03 DIAGNOSIS — R296 Repeated falls: Secondary | ICD-10-CM | POA: Diagnosis not present

## 2021-10-03 DIAGNOSIS — I4891 Unspecified atrial fibrillation: Secondary | ICD-10-CM | POA: Diagnosis not present

## 2021-10-03 DIAGNOSIS — Z7901 Long term (current) use of anticoagulants: Secondary | ICD-10-CM | POA: Diagnosis not present

## 2021-10-08 DIAGNOSIS — R296 Repeated falls: Secondary | ICD-10-CM | POA: Diagnosis not present

## 2021-10-08 DIAGNOSIS — M1030 Gout due to renal impairment, unspecified site: Secondary | ICD-10-CM | POA: Diagnosis not present

## 2021-10-08 DIAGNOSIS — R69 Illness, unspecified: Secondary | ICD-10-CM | POA: Diagnosis not present

## 2021-10-08 DIAGNOSIS — K219 Gastro-esophageal reflux disease without esophagitis: Secondary | ICD-10-CM | POA: Diagnosis not present

## 2021-10-08 DIAGNOSIS — Z9181 History of falling: Secondary | ICD-10-CM | POA: Diagnosis not present

## 2021-10-08 DIAGNOSIS — D62 Acute posthemorrhagic anemia: Secondary | ICD-10-CM | POA: Diagnosis not present

## 2021-10-08 DIAGNOSIS — I10 Essential (primary) hypertension: Secondary | ICD-10-CM | POA: Diagnosis not present

## 2021-10-08 DIAGNOSIS — N183 Chronic kidney disease, stage 3 unspecified: Secondary | ICD-10-CM | POA: Diagnosis not present

## 2021-10-08 DIAGNOSIS — Z8673 Personal history of transient ischemic attack (TIA), and cerebral infarction without residual deficits: Secondary | ICD-10-CM | POA: Diagnosis not present

## 2021-10-08 DIAGNOSIS — S72001D Fracture of unspecified part of neck of right femur, subsequent encounter for closed fracture with routine healing: Secondary | ICD-10-CM | POA: Diagnosis not present

## 2021-10-08 DIAGNOSIS — Z950 Presence of cardiac pacemaker: Secondary | ICD-10-CM | POA: Diagnosis not present

## 2021-10-08 DIAGNOSIS — E039 Hypothyroidism, unspecified: Secondary | ICD-10-CM | POA: Diagnosis not present

## 2021-10-08 DIAGNOSIS — Z86718 Personal history of other venous thrombosis and embolism: Secondary | ICD-10-CM | POA: Diagnosis not present

## 2021-10-08 DIAGNOSIS — Z7901 Long term (current) use of anticoagulants: Secondary | ICD-10-CM | POA: Diagnosis not present

## 2021-10-08 DIAGNOSIS — E1122 Type 2 diabetes mellitus with diabetic chronic kidney disease: Secondary | ICD-10-CM | POA: Diagnosis not present

## 2021-10-08 DIAGNOSIS — I4891 Unspecified atrial fibrillation: Secondary | ICD-10-CM | POA: Diagnosis not present

## 2021-10-10 DIAGNOSIS — Z9181 History of falling: Secondary | ICD-10-CM | POA: Diagnosis not present

## 2021-10-10 DIAGNOSIS — Z86718 Personal history of other venous thrombosis and embolism: Secondary | ICD-10-CM | POA: Diagnosis not present

## 2021-10-10 DIAGNOSIS — Z950 Presence of cardiac pacemaker: Secondary | ICD-10-CM | POA: Diagnosis not present

## 2021-10-10 DIAGNOSIS — D62 Acute posthemorrhagic anemia: Secondary | ICD-10-CM | POA: Diagnosis not present

## 2021-10-10 DIAGNOSIS — M1030 Gout due to renal impairment, unspecified site: Secondary | ICD-10-CM | POA: Diagnosis not present

## 2021-10-10 DIAGNOSIS — E039 Hypothyroidism, unspecified: Secondary | ICD-10-CM | POA: Diagnosis not present

## 2021-10-10 DIAGNOSIS — E1122 Type 2 diabetes mellitus with diabetic chronic kidney disease: Secondary | ICD-10-CM | POA: Diagnosis not present

## 2021-10-10 DIAGNOSIS — N183 Chronic kidney disease, stage 3 unspecified: Secondary | ICD-10-CM | POA: Diagnosis not present

## 2021-10-10 DIAGNOSIS — K219 Gastro-esophageal reflux disease without esophagitis: Secondary | ICD-10-CM | POA: Diagnosis not present

## 2021-10-10 DIAGNOSIS — I10 Essential (primary) hypertension: Secondary | ICD-10-CM | POA: Diagnosis not present

## 2021-10-10 DIAGNOSIS — I4891 Unspecified atrial fibrillation: Secondary | ICD-10-CM | POA: Diagnosis not present

## 2021-10-10 DIAGNOSIS — R69 Illness, unspecified: Secondary | ICD-10-CM | POA: Diagnosis not present

## 2021-10-10 DIAGNOSIS — Z7901 Long term (current) use of anticoagulants: Secondary | ICD-10-CM | POA: Diagnosis not present

## 2021-10-10 DIAGNOSIS — Z8673 Personal history of transient ischemic attack (TIA), and cerebral infarction without residual deficits: Secondary | ICD-10-CM | POA: Diagnosis not present

## 2021-10-10 DIAGNOSIS — R296 Repeated falls: Secondary | ICD-10-CM | POA: Diagnosis not present

## 2021-10-10 DIAGNOSIS — S72001D Fracture of unspecified part of neck of right femur, subsequent encounter for closed fracture with routine healing: Secondary | ICD-10-CM | POA: Diagnosis not present

## 2021-10-13 DIAGNOSIS — S72001A Fracture of unspecified part of neck of right femur, initial encounter for closed fracture: Secondary | ICD-10-CM | POA: Diagnosis not present

## 2021-10-13 DIAGNOSIS — R112 Nausea with vomiting, unspecified: Secondary | ICD-10-CM | POA: Diagnosis not present

## 2021-10-15 DIAGNOSIS — E1122 Type 2 diabetes mellitus with diabetic chronic kidney disease: Secondary | ICD-10-CM | POA: Diagnosis not present

## 2021-10-15 DIAGNOSIS — K219 Gastro-esophageal reflux disease without esophagitis: Secondary | ICD-10-CM | POA: Diagnosis not present

## 2021-10-15 DIAGNOSIS — R69 Illness, unspecified: Secondary | ICD-10-CM | POA: Diagnosis not present

## 2021-10-15 DIAGNOSIS — S72001D Fracture of unspecified part of neck of right femur, subsequent encounter for closed fracture with routine healing: Secondary | ICD-10-CM | POA: Diagnosis not present

## 2021-10-15 DIAGNOSIS — Z950 Presence of cardiac pacemaker: Secondary | ICD-10-CM | POA: Diagnosis not present

## 2021-10-15 DIAGNOSIS — D62 Acute posthemorrhagic anemia: Secondary | ICD-10-CM | POA: Diagnosis not present

## 2021-10-15 DIAGNOSIS — N183 Chronic kidney disease, stage 3 unspecified: Secondary | ICD-10-CM | POA: Diagnosis not present

## 2021-10-15 DIAGNOSIS — I10 Essential (primary) hypertension: Secondary | ICD-10-CM | POA: Diagnosis not present

## 2021-10-15 DIAGNOSIS — Z86718 Personal history of other venous thrombosis and embolism: Secondary | ICD-10-CM | POA: Diagnosis not present

## 2021-10-15 DIAGNOSIS — I4891 Unspecified atrial fibrillation: Secondary | ICD-10-CM | POA: Diagnosis not present

## 2021-10-15 DIAGNOSIS — Z7901 Long term (current) use of anticoagulants: Secondary | ICD-10-CM | POA: Diagnosis not present

## 2021-10-15 DIAGNOSIS — M1030 Gout due to renal impairment, unspecified site: Secondary | ICD-10-CM | POA: Diagnosis not present

## 2021-10-15 DIAGNOSIS — R296 Repeated falls: Secondary | ICD-10-CM | POA: Diagnosis not present

## 2021-10-15 DIAGNOSIS — Z8673 Personal history of transient ischemic attack (TIA), and cerebral infarction without residual deficits: Secondary | ICD-10-CM | POA: Diagnosis not present

## 2021-10-15 DIAGNOSIS — E039 Hypothyroidism, unspecified: Secondary | ICD-10-CM | POA: Diagnosis not present

## 2021-10-15 DIAGNOSIS — Z9181 History of falling: Secondary | ICD-10-CM | POA: Diagnosis not present

## 2021-10-16 DIAGNOSIS — Z86718 Personal history of other venous thrombosis and embolism: Secondary | ICD-10-CM | POA: Diagnosis not present

## 2021-10-16 DIAGNOSIS — Z8673 Personal history of transient ischemic attack (TIA), and cerebral infarction without residual deficits: Secondary | ICD-10-CM | POA: Diagnosis not present

## 2021-10-16 DIAGNOSIS — S72001D Fracture of unspecified part of neck of right femur, subsequent encounter for closed fracture with routine healing: Secondary | ICD-10-CM | POA: Diagnosis not present

## 2021-10-16 DIAGNOSIS — M1030 Gout due to renal impairment, unspecified site: Secondary | ICD-10-CM | POA: Diagnosis not present

## 2021-10-16 DIAGNOSIS — Z950 Presence of cardiac pacemaker: Secondary | ICD-10-CM | POA: Diagnosis not present

## 2021-10-16 DIAGNOSIS — K219 Gastro-esophageal reflux disease without esophagitis: Secondary | ICD-10-CM | POA: Diagnosis not present

## 2021-10-16 DIAGNOSIS — I4821 Permanent atrial fibrillation: Secondary | ICD-10-CM | POA: Diagnosis not present

## 2021-10-16 DIAGNOSIS — R69 Illness, unspecified: Secondary | ICD-10-CM | POA: Diagnosis not present

## 2021-10-16 DIAGNOSIS — E039 Hypothyroidism, unspecified: Secondary | ICD-10-CM | POA: Diagnosis not present

## 2021-10-16 DIAGNOSIS — D62 Acute posthemorrhagic anemia: Secondary | ICD-10-CM | POA: Diagnosis not present

## 2021-10-16 DIAGNOSIS — I4891 Unspecified atrial fibrillation: Secondary | ICD-10-CM | POA: Diagnosis not present

## 2021-10-16 DIAGNOSIS — Z9181 History of falling: Secondary | ICD-10-CM | POA: Diagnosis not present

## 2021-10-16 DIAGNOSIS — E1122 Type 2 diabetes mellitus with diabetic chronic kidney disease: Secondary | ICD-10-CM | POA: Diagnosis not present

## 2021-10-16 DIAGNOSIS — Z7901 Long term (current) use of anticoagulants: Secondary | ICD-10-CM | POA: Diagnosis not present

## 2021-10-16 DIAGNOSIS — N183 Chronic kidney disease, stage 3 unspecified: Secondary | ICD-10-CM | POA: Diagnosis not present

## 2021-10-16 DIAGNOSIS — I10 Essential (primary) hypertension: Secondary | ICD-10-CM | POA: Diagnosis not present

## 2021-10-16 DIAGNOSIS — R296 Repeated falls: Secondary | ICD-10-CM | POA: Diagnosis not present

## 2021-10-17 DIAGNOSIS — Z8673 Personal history of transient ischemic attack (TIA), and cerebral infarction without residual deficits: Secondary | ICD-10-CM | POA: Diagnosis not present

## 2021-10-17 DIAGNOSIS — R296 Repeated falls: Secondary | ICD-10-CM | POA: Diagnosis not present

## 2021-10-17 DIAGNOSIS — K219 Gastro-esophageal reflux disease without esophagitis: Secondary | ICD-10-CM | POA: Diagnosis not present

## 2021-10-17 DIAGNOSIS — R69 Illness, unspecified: Secondary | ICD-10-CM | POA: Diagnosis not present

## 2021-10-17 DIAGNOSIS — E039 Hypothyroidism, unspecified: Secondary | ICD-10-CM | POA: Diagnosis not present

## 2021-10-17 DIAGNOSIS — E1122 Type 2 diabetes mellitus with diabetic chronic kidney disease: Secondary | ICD-10-CM | POA: Diagnosis not present

## 2021-10-17 DIAGNOSIS — N183 Chronic kidney disease, stage 3 unspecified: Secondary | ICD-10-CM | POA: Diagnosis not present

## 2021-10-17 DIAGNOSIS — I10 Essential (primary) hypertension: Secondary | ICD-10-CM | POA: Diagnosis not present

## 2021-10-17 DIAGNOSIS — Z86718 Personal history of other venous thrombosis and embolism: Secondary | ICD-10-CM | POA: Diagnosis not present

## 2021-10-17 DIAGNOSIS — Z9181 History of falling: Secondary | ICD-10-CM | POA: Diagnosis not present

## 2021-10-17 DIAGNOSIS — I4891 Unspecified atrial fibrillation: Secondary | ICD-10-CM | POA: Diagnosis not present

## 2021-10-17 DIAGNOSIS — M1030 Gout due to renal impairment, unspecified site: Secondary | ICD-10-CM | POA: Diagnosis not present

## 2021-10-17 DIAGNOSIS — Z950 Presence of cardiac pacemaker: Secondary | ICD-10-CM | POA: Diagnosis not present

## 2021-10-17 DIAGNOSIS — S72001D Fracture of unspecified part of neck of right femur, subsequent encounter for closed fracture with routine healing: Secondary | ICD-10-CM | POA: Diagnosis not present

## 2021-10-17 DIAGNOSIS — Z7901 Long term (current) use of anticoagulants: Secondary | ICD-10-CM | POA: Diagnosis not present

## 2021-10-17 DIAGNOSIS — D62 Acute posthemorrhagic anemia: Secondary | ICD-10-CM | POA: Diagnosis not present

## 2021-10-22 DIAGNOSIS — Z950 Presence of cardiac pacemaker: Secondary | ICD-10-CM | POA: Diagnosis not present

## 2021-10-22 DIAGNOSIS — M1030 Gout due to renal impairment, unspecified site: Secondary | ICD-10-CM | POA: Diagnosis not present

## 2021-10-22 DIAGNOSIS — I10 Essential (primary) hypertension: Secondary | ICD-10-CM | POA: Diagnosis not present

## 2021-10-22 DIAGNOSIS — S72001D Fracture of unspecified part of neck of right femur, subsequent encounter for closed fracture with routine healing: Secondary | ICD-10-CM | POA: Diagnosis not present

## 2021-10-22 DIAGNOSIS — I4891 Unspecified atrial fibrillation: Secondary | ICD-10-CM | POA: Diagnosis not present

## 2021-10-22 DIAGNOSIS — Z7901 Long term (current) use of anticoagulants: Secondary | ICD-10-CM | POA: Diagnosis not present

## 2021-10-22 DIAGNOSIS — Z8673 Personal history of transient ischemic attack (TIA), and cerebral infarction without residual deficits: Secondary | ICD-10-CM | POA: Diagnosis not present

## 2021-10-22 DIAGNOSIS — D62 Acute posthemorrhagic anemia: Secondary | ICD-10-CM | POA: Diagnosis not present

## 2021-10-22 DIAGNOSIS — E782 Mixed hyperlipidemia: Secondary | ICD-10-CM | POA: Diagnosis not present

## 2021-10-22 DIAGNOSIS — E1122 Type 2 diabetes mellitus with diabetic chronic kidney disease: Secondary | ICD-10-CM | POA: Diagnosis not present

## 2021-10-22 DIAGNOSIS — Z9181 History of falling: Secondary | ICD-10-CM | POA: Diagnosis not present

## 2021-10-22 DIAGNOSIS — E039 Hypothyroidism, unspecified: Secondary | ICD-10-CM | POA: Diagnosis not present

## 2021-10-22 DIAGNOSIS — K219 Gastro-esophageal reflux disease without esophagitis: Secondary | ICD-10-CM | POA: Diagnosis not present

## 2021-10-22 DIAGNOSIS — Z86718 Personal history of other venous thrombosis and embolism: Secondary | ICD-10-CM | POA: Diagnosis not present

## 2021-10-22 DIAGNOSIS — I129 Hypertensive chronic kidney disease with stage 1 through stage 4 chronic kidney disease, or unspecified chronic kidney disease: Secondary | ICD-10-CM | POA: Diagnosis not present

## 2021-10-22 DIAGNOSIS — R296 Repeated falls: Secondary | ICD-10-CM | POA: Diagnosis not present

## 2021-10-22 DIAGNOSIS — N1831 Chronic kidney disease, stage 3a: Secondary | ICD-10-CM | POA: Diagnosis not present

## 2021-10-22 DIAGNOSIS — N183 Chronic kidney disease, stage 3 unspecified: Secondary | ICD-10-CM | POA: Diagnosis not present

## 2021-10-22 DIAGNOSIS — R69 Illness, unspecified: Secondary | ICD-10-CM | POA: Diagnosis not present

## 2021-10-24 DIAGNOSIS — Z8673 Personal history of transient ischemic attack (TIA), and cerebral infarction without residual deficits: Secondary | ICD-10-CM | POA: Diagnosis not present

## 2021-10-24 DIAGNOSIS — D62 Acute posthemorrhagic anemia: Secondary | ICD-10-CM | POA: Diagnosis not present

## 2021-10-24 DIAGNOSIS — E039 Hypothyroidism, unspecified: Secondary | ICD-10-CM | POA: Diagnosis not present

## 2021-10-24 DIAGNOSIS — R296 Repeated falls: Secondary | ICD-10-CM | POA: Diagnosis not present

## 2021-10-24 DIAGNOSIS — S72001D Fracture of unspecified part of neck of right femur, subsequent encounter for closed fracture with routine healing: Secondary | ICD-10-CM | POA: Diagnosis not present

## 2021-10-24 DIAGNOSIS — R69 Illness, unspecified: Secondary | ICD-10-CM | POA: Diagnosis not present

## 2021-10-24 DIAGNOSIS — K219 Gastro-esophageal reflux disease without esophagitis: Secondary | ICD-10-CM | POA: Diagnosis not present

## 2021-10-24 DIAGNOSIS — N183 Chronic kidney disease, stage 3 unspecified: Secondary | ICD-10-CM | POA: Diagnosis not present

## 2021-10-24 DIAGNOSIS — Z86718 Personal history of other venous thrombosis and embolism: Secondary | ICD-10-CM | POA: Diagnosis not present

## 2021-10-24 DIAGNOSIS — M1030 Gout due to renal impairment, unspecified site: Secondary | ICD-10-CM | POA: Diagnosis not present

## 2021-10-24 DIAGNOSIS — I10 Essential (primary) hypertension: Secondary | ICD-10-CM | POA: Diagnosis not present

## 2021-10-24 DIAGNOSIS — Z9181 History of falling: Secondary | ICD-10-CM | POA: Diagnosis not present

## 2021-10-24 DIAGNOSIS — I4891 Unspecified atrial fibrillation: Secondary | ICD-10-CM | POA: Diagnosis not present

## 2021-10-24 DIAGNOSIS — Z7901 Long term (current) use of anticoagulants: Secondary | ICD-10-CM | POA: Diagnosis not present

## 2021-10-24 DIAGNOSIS — E1122 Type 2 diabetes mellitus with diabetic chronic kidney disease: Secondary | ICD-10-CM | POA: Diagnosis not present

## 2021-10-24 DIAGNOSIS — Z950 Presence of cardiac pacemaker: Secondary | ICD-10-CM | POA: Diagnosis not present

## 2021-10-27 ENCOUNTER — Ambulatory Visit (INDEPENDENT_AMBULATORY_CARE_PROVIDER_SITE_OTHER): Payer: Medicare HMO

## 2021-10-27 DIAGNOSIS — R296 Repeated falls: Secondary | ICD-10-CM | POA: Diagnosis not present

## 2021-10-27 DIAGNOSIS — I4891 Unspecified atrial fibrillation: Secondary | ICD-10-CM | POA: Diagnosis not present

## 2021-10-27 DIAGNOSIS — E039 Hypothyroidism, unspecified: Secondary | ICD-10-CM | POA: Diagnosis not present

## 2021-10-27 DIAGNOSIS — E1122 Type 2 diabetes mellitus with diabetic chronic kidney disease: Secondary | ICD-10-CM | POA: Diagnosis not present

## 2021-10-27 DIAGNOSIS — Z8673 Personal history of transient ischemic attack (TIA), and cerebral infarction without residual deficits: Secondary | ICD-10-CM | POA: Diagnosis not present

## 2021-10-27 DIAGNOSIS — Z9181 History of falling: Secondary | ICD-10-CM | POA: Diagnosis not present

## 2021-10-27 DIAGNOSIS — S72001D Fracture of unspecified part of neck of right femur, subsequent encounter for closed fracture with routine healing: Secondary | ICD-10-CM | POA: Diagnosis not present

## 2021-10-27 DIAGNOSIS — D62 Acute posthemorrhagic anemia: Secondary | ICD-10-CM | POA: Diagnosis not present

## 2021-10-27 DIAGNOSIS — Z86718 Personal history of other venous thrombosis and embolism: Secondary | ICD-10-CM | POA: Diagnosis not present

## 2021-10-27 DIAGNOSIS — I495 Sick sinus syndrome: Secondary | ICD-10-CM

## 2021-10-27 DIAGNOSIS — I10 Essential (primary) hypertension: Secondary | ICD-10-CM | POA: Diagnosis not present

## 2021-10-27 DIAGNOSIS — R69 Illness, unspecified: Secondary | ICD-10-CM | POA: Diagnosis not present

## 2021-10-27 DIAGNOSIS — K219 Gastro-esophageal reflux disease without esophagitis: Secondary | ICD-10-CM | POA: Diagnosis not present

## 2021-10-27 DIAGNOSIS — N183 Chronic kidney disease, stage 3 unspecified: Secondary | ICD-10-CM | POA: Diagnosis not present

## 2021-10-27 DIAGNOSIS — Z950 Presence of cardiac pacemaker: Secondary | ICD-10-CM | POA: Diagnosis not present

## 2021-10-27 DIAGNOSIS — M1030 Gout due to renal impairment, unspecified site: Secondary | ICD-10-CM | POA: Diagnosis not present

## 2021-10-27 DIAGNOSIS — Z7901 Long term (current) use of anticoagulants: Secondary | ICD-10-CM | POA: Diagnosis not present

## 2021-10-28 LAB — CUP PACEART REMOTE DEVICE CHECK
Battery Remaining Longevity: 4 mo
Battery Remaining Percentage: 3 %
Battery Voltage: 2.71 V
Brady Statistic AP VP Percent: 41 %
Brady Statistic AP VS Percent: 52 %
Brady Statistic AS VP Percent: 1 %
Brady Statistic AS VS Percent: 7.6 %
Brady Statistic RA Percent Paced: 92 %
Brady Statistic RV Percent Paced: 41 %
Date Time Interrogation Session: 20230606104141
Implantable Lead Implant Date: 20130913
Implantable Lead Implant Date: 20130913
Implantable Lead Location: 753859
Implantable Lead Location: 753860
Implantable Lead Model: 1948
Implantable Pulse Generator Implant Date: 20130913
Lead Channel Impedance Value: 1475 Ohm
Lead Channel Impedance Value: 490 Ohm
Lead Channel Pacing Threshold Amplitude: 0.75 V
Lead Channel Pacing Threshold Amplitude: 2 V
Lead Channel Pacing Threshold Pulse Width: 0.5 ms
Lead Channel Pacing Threshold Pulse Width: 0.8 ms
Lead Channel Sensing Intrinsic Amplitude: 12 mV
Lead Channel Sensing Intrinsic Amplitude: 2.3 mV
Lead Channel Setting Pacing Amplitude: 2 V
Lead Channel Setting Pacing Amplitude: 2.75 V
Lead Channel Setting Pacing Pulse Width: 0.8 ms
Lead Channel Setting Sensing Sensitivity: 2 mV
Pulse Gen Model: 2210
Pulse Gen Serial Number: 7382865

## 2021-10-31 ENCOUNTER — Encounter: Payer: Self-pay | Admitting: Orthopedic Surgery

## 2021-10-31 ENCOUNTER — Ambulatory Visit: Payer: Medicare HMO

## 2021-10-31 ENCOUNTER — Ambulatory Visit (INDEPENDENT_AMBULATORY_CARE_PROVIDER_SITE_OTHER): Payer: Medicare HMO | Admitting: Orthopedic Surgery

## 2021-10-31 DIAGNOSIS — G8929 Other chronic pain: Secondary | ICD-10-CM | POA: Diagnosis not present

## 2021-10-31 DIAGNOSIS — M25561 Pain in right knee: Secondary | ICD-10-CM | POA: Diagnosis not present

## 2021-10-31 DIAGNOSIS — S72001A Fracture of unspecified part of neck of right femur, initial encounter for closed fracture: Secondary | ICD-10-CM

## 2021-10-31 NOTE — Progress Notes (Signed)
FOLLOW UP   Encounter Diagnoses  Name Primary?   Closed displaced fracture of right femoral neck ([facility]) s/p cannulated screws placed 09/20/21 Yes   Chronic pain of right knee      Chief Complaint  Patient presents with   Post-op Follow-up    09/20/21 right hip fracture    HIP ROM GOOD  NOT REQ ANY CANE OR WALKER   Xrays hwr intact fracture is good   Xrays in 6 weeks  Right knee pain / chronic  Procedure note right knee injection   verbal consent was obtained to inject right knee joint  Timeout was completed to confirm the site of injection  The medications used were depomedrol 40 mg and 1% lidocaine 3 cc Anesthesia was provided by ethyl chloride and the skin was prepped with alcohol.  After cleaning the skin with alcohol a 20-gauge needle was used to inject the right knee joint. There were no complications. A sterile bandage was applied.

## 2021-11-03 DIAGNOSIS — M1030 Gout due to renal impairment, unspecified site: Secondary | ICD-10-CM | POA: Diagnosis not present

## 2021-11-03 DIAGNOSIS — N183 Chronic kidney disease, stage 3 unspecified: Secondary | ICD-10-CM | POA: Diagnosis not present

## 2021-11-03 DIAGNOSIS — E1122 Type 2 diabetes mellitus with diabetic chronic kidney disease: Secondary | ICD-10-CM | POA: Diagnosis not present

## 2021-11-03 DIAGNOSIS — I10 Essential (primary) hypertension: Secondary | ICD-10-CM | POA: Diagnosis not present

## 2021-11-03 DIAGNOSIS — K219 Gastro-esophageal reflux disease without esophagitis: Secondary | ICD-10-CM | POA: Diagnosis not present

## 2021-11-03 DIAGNOSIS — I4891 Unspecified atrial fibrillation: Secondary | ICD-10-CM | POA: Diagnosis not present

## 2021-11-03 DIAGNOSIS — D62 Acute posthemorrhagic anemia: Secondary | ICD-10-CM | POA: Diagnosis not present

## 2021-11-03 DIAGNOSIS — Z950 Presence of cardiac pacemaker: Secondary | ICD-10-CM | POA: Diagnosis not present

## 2021-11-03 DIAGNOSIS — R69 Illness, unspecified: Secondary | ICD-10-CM | POA: Diagnosis not present

## 2021-11-03 DIAGNOSIS — R296 Repeated falls: Secondary | ICD-10-CM | POA: Diagnosis not present

## 2021-11-03 DIAGNOSIS — Z9181 History of falling: Secondary | ICD-10-CM | POA: Diagnosis not present

## 2021-11-03 DIAGNOSIS — S72001D Fracture of unspecified part of neck of right femur, subsequent encounter for closed fracture with routine healing: Secondary | ICD-10-CM | POA: Diagnosis not present

## 2021-11-03 DIAGNOSIS — E039 Hypothyroidism, unspecified: Secondary | ICD-10-CM | POA: Diagnosis not present

## 2021-11-03 DIAGNOSIS — Z7901 Long term (current) use of anticoagulants: Secondary | ICD-10-CM | POA: Diagnosis not present

## 2021-11-03 DIAGNOSIS — Z8673 Personal history of transient ischemic attack (TIA), and cerebral infarction without residual deficits: Secondary | ICD-10-CM | POA: Diagnosis not present

## 2021-11-03 DIAGNOSIS — Z86718 Personal history of other venous thrombosis and embolism: Secondary | ICD-10-CM | POA: Diagnosis not present

## 2021-11-12 DIAGNOSIS — I69354 Hemiplegia and hemiparesis following cerebral infarction affecting left non-dominant side: Secondary | ICD-10-CM | POA: Diagnosis not present

## 2021-11-12 DIAGNOSIS — E039 Hypothyroidism, unspecified: Secondary | ICD-10-CM | POA: Diagnosis not present

## 2021-11-12 DIAGNOSIS — M109 Gout, unspecified: Secondary | ICD-10-CM | POA: Diagnosis not present

## 2021-11-12 DIAGNOSIS — M810 Age-related osteoporosis without current pathological fracture: Secondary | ICD-10-CM | POA: Diagnosis not present

## 2021-11-12 DIAGNOSIS — J309 Allergic rhinitis, unspecified: Secondary | ICD-10-CM | POA: Diagnosis not present

## 2021-11-12 DIAGNOSIS — I4891 Unspecified atrial fibrillation: Secondary | ICD-10-CM | POA: Diagnosis not present

## 2021-11-12 DIAGNOSIS — D6869 Other thrombophilia: Secondary | ICD-10-CM | POA: Diagnosis not present

## 2021-11-12 DIAGNOSIS — E785 Hyperlipidemia, unspecified: Secondary | ICD-10-CM | POA: Diagnosis not present

## 2021-11-12 DIAGNOSIS — I495 Sick sinus syndrome: Secondary | ICD-10-CM | POA: Diagnosis not present

## 2021-11-12 DIAGNOSIS — E1142 Type 2 diabetes mellitus with diabetic polyneuropathy: Secondary | ICD-10-CM | POA: Diagnosis not present

## 2021-11-12 DIAGNOSIS — R69 Illness, unspecified: Secondary | ICD-10-CM | POA: Diagnosis not present

## 2021-11-12 NOTE — Addendum Note (Signed)
Addended by: Cheri Kearns A on: 11/12/2021 01:19 PM   Modules accepted: Level of Service

## 2021-11-12 NOTE — Progress Notes (Signed)
Remote pacemaker transmission.   

## 2021-11-13 DIAGNOSIS — R296 Repeated falls: Secondary | ICD-10-CM | POA: Diagnosis not present

## 2021-11-13 DIAGNOSIS — E1122 Type 2 diabetes mellitus with diabetic chronic kidney disease: Secondary | ICD-10-CM | POA: Diagnosis not present

## 2021-11-13 DIAGNOSIS — R69 Illness, unspecified: Secondary | ICD-10-CM | POA: Diagnosis not present

## 2021-11-13 DIAGNOSIS — S72001D Fracture of unspecified part of neck of right femur, subsequent encounter for closed fracture with routine healing: Secondary | ICD-10-CM | POA: Diagnosis not present

## 2021-11-13 DIAGNOSIS — Z86718 Personal history of other venous thrombosis and embolism: Secondary | ICD-10-CM | POA: Diagnosis not present

## 2021-11-13 DIAGNOSIS — I4891 Unspecified atrial fibrillation: Secondary | ICD-10-CM | POA: Diagnosis not present

## 2021-11-13 DIAGNOSIS — E039 Hypothyroidism, unspecified: Secondary | ICD-10-CM | POA: Diagnosis not present

## 2021-11-13 DIAGNOSIS — I10 Essential (primary) hypertension: Secondary | ICD-10-CM | POA: Diagnosis not present

## 2021-11-13 DIAGNOSIS — D62 Acute posthemorrhagic anemia: Secondary | ICD-10-CM | POA: Diagnosis not present

## 2021-11-13 DIAGNOSIS — Z7901 Long term (current) use of anticoagulants: Secondary | ICD-10-CM | POA: Diagnosis not present

## 2021-11-13 DIAGNOSIS — N183 Chronic kidney disease, stage 3 unspecified: Secondary | ICD-10-CM | POA: Diagnosis not present

## 2021-11-13 DIAGNOSIS — K219 Gastro-esophageal reflux disease without esophagitis: Secondary | ICD-10-CM | POA: Diagnosis not present

## 2021-11-13 DIAGNOSIS — Z8673 Personal history of transient ischemic attack (TIA), and cerebral infarction without residual deficits: Secondary | ICD-10-CM | POA: Diagnosis not present

## 2021-11-13 DIAGNOSIS — Z950 Presence of cardiac pacemaker: Secondary | ICD-10-CM | POA: Diagnosis not present

## 2021-11-13 DIAGNOSIS — M1030 Gout due to renal impairment, unspecified site: Secondary | ICD-10-CM | POA: Diagnosis not present

## 2021-11-13 DIAGNOSIS — Z9181 History of falling: Secondary | ICD-10-CM | POA: Diagnosis not present

## 2021-11-14 DIAGNOSIS — I4821 Permanent atrial fibrillation: Secondary | ICD-10-CM | POA: Diagnosis not present

## 2021-11-21 DIAGNOSIS — I129 Hypertensive chronic kidney disease with stage 1 through stage 4 chronic kidney disease, or unspecified chronic kidney disease: Secondary | ICD-10-CM | POA: Diagnosis not present

## 2021-11-21 DIAGNOSIS — N1831 Chronic kidney disease, stage 3a: Secondary | ICD-10-CM | POA: Diagnosis not present

## 2021-11-21 DIAGNOSIS — E782 Mixed hyperlipidemia: Secondary | ICD-10-CM | POA: Diagnosis not present

## 2021-11-21 DIAGNOSIS — E1122 Type 2 diabetes mellitus with diabetic chronic kidney disease: Secondary | ICD-10-CM | POA: Diagnosis not present

## 2021-11-24 ENCOUNTER — Other Ambulatory Visit: Payer: Self-pay | Admitting: Adult Health

## 2021-12-01 ENCOUNTER — Ambulatory Visit (INDEPENDENT_AMBULATORY_CARE_PROVIDER_SITE_OTHER): Payer: Medicare HMO

## 2021-12-01 DIAGNOSIS — I495 Sick sinus syndrome: Secondary | ICD-10-CM

## 2021-12-02 LAB — CUP PACEART REMOTE DEVICE CHECK
Battery Remaining Longevity: 3 mo
Battery Remaining Percentage: 3 %
Battery Voltage: 2.69 V
Brady Statistic AP VP Percent: 40 %
Brady Statistic AP VS Percent: 53 %
Brady Statistic AS VP Percent: 1 %
Brady Statistic AS VS Percent: 7.5 %
Brady Statistic RA Percent Paced: 92 %
Brady Statistic RV Percent Paced: 40 %
Date Time Interrogation Session: 20230711131241
Implantable Lead Implant Date: 20130913
Implantable Lead Implant Date: 20130913
Implantable Lead Location: 753859
Implantable Lead Location: 753860
Implantable Lead Model: 1948
Implantable Pulse Generator Implant Date: 20130913
Lead Channel Impedance Value: 1425 Ohm
Lead Channel Impedance Value: 480 Ohm
Lead Channel Pacing Threshold Amplitude: 0.75 V
Lead Channel Pacing Threshold Amplitude: 2 V
Lead Channel Pacing Threshold Pulse Width: 0.5 ms
Lead Channel Pacing Threshold Pulse Width: 0.8 ms
Lead Channel Sensing Intrinsic Amplitude: 12 mV
Lead Channel Sensing Intrinsic Amplitude: 2.3 mV
Lead Channel Setting Pacing Amplitude: 2 V
Lead Channel Setting Pacing Amplitude: 2.75 V
Lead Channel Setting Pacing Pulse Width: 0.8 ms
Lead Channel Setting Sensing Sensitivity: 2 mV
Pulse Gen Model: 2210
Pulse Gen Serial Number: 7382865

## 2021-12-11 DIAGNOSIS — I4821 Permanent atrial fibrillation: Secondary | ICD-10-CM | POA: Diagnosis not present

## 2021-12-15 ENCOUNTER — Ambulatory Visit (INDEPENDENT_AMBULATORY_CARE_PROVIDER_SITE_OTHER): Payer: Medicare HMO | Admitting: Orthopedic Surgery

## 2021-12-15 ENCOUNTER — Ambulatory Visit (INDEPENDENT_AMBULATORY_CARE_PROVIDER_SITE_OTHER): Payer: Medicare HMO

## 2021-12-15 DIAGNOSIS — S72001A Fracture of unspecified part of neck of right femur, initial encounter for closed fracture: Secondary | ICD-10-CM

## 2021-12-15 NOTE — Progress Notes (Signed)
FOLLOW UP   Encounter Diagnosis  Name Primary?   Closed displaced fracture of right femoral neck [name]) Yes     Chief Complaint  Patient presents with   Post-op Follow-up     Closed displaced fracture of right femoral neck (HCC) s/p cannulated screws placed 09/20/21 Doing real good     3 months postop ORIF right femoral neck fracture with cannulated screws  Doing well no assistive devices occasionally has some groin pain  X-rays show no hardware complications fracture healed  Recommend x-ray postop 1 year for evaluation of hardware and rule out any avascular necrosis  Patient wants to be seen for left shoulder.  Patient had stroke affecting her left upper extremity limited to 40% of function but was of available to have left shoulder replacement and then had a fall last January wants to have it evaluated because of pain in the left arm

## 2021-12-16 ENCOUNTER — Ambulatory Visit (INDEPENDENT_AMBULATORY_CARE_PROVIDER_SITE_OTHER): Payer: Medicare HMO

## 2021-12-16 DIAGNOSIS — I495 Sick sinus syndrome: Secondary | ICD-10-CM | POA: Diagnosis not present

## 2021-12-16 LAB — CUP PACEART REMOTE DEVICE CHECK
Battery Remaining Longevity: 3 mo
Battery Remaining Percentage: 2 %
Battery Voltage: 2.68 V
Brady Statistic AP VP Percent: 40 %
Brady Statistic AP VS Percent: 53 %
Brady Statistic AS VP Percent: 1 %
Brady Statistic AS VS Percent: 7.4 %
Brady Statistic RA Percent Paced: 92 %
Brady Statistic RV Percent Paced: 40 %
Date Time Interrogation Session: 20230725035541
Implantable Lead Implant Date: 20130913
Implantable Lead Implant Date: 20130913
Implantable Lead Location: 753859
Implantable Lead Location: 753860
Implantable Lead Model: 1948
Implantable Pulse Generator Implant Date: 20130913
Lead Channel Impedance Value: 1550 Ohm
Lead Channel Impedance Value: 510 Ohm
Lead Channel Pacing Threshold Amplitude: 0.75 V
Lead Channel Pacing Threshold Amplitude: 2 V
Lead Channel Pacing Threshold Pulse Width: 0.5 ms
Lead Channel Pacing Threshold Pulse Width: 0.8 ms
Lead Channel Sensing Intrinsic Amplitude: 1.2 mV
Lead Channel Sensing Intrinsic Amplitude: 12 mV
Lead Channel Setting Pacing Amplitude: 2 V
Lead Channel Setting Pacing Amplitude: 2.75 V
Lead Channel Setting Pacing Pulse Width: 0.8 ms
Lead Channel Setting Sensing Sensitivity: 2 mV
Pulse Gen Model: 2210
Pulse Gen Serial Number: 7382865

## 2021-12-25 NOTE — Progress Notes (Signed)
Remote reviewed. Battery status noted.  Leads function stable

## 2022-01-01 NOTE — Progress Notes (Signed)
Remote pacemaker transmission.   

## 2022-01-01 NOTE — Addendum Note (Signed)
Addended by: Douglass Rivers D on: 01/01/2022 10:25 AM   Modules accepted: Level of Service

## 2022-01-05 ENCOUNTER — Ambulatory Visit: Payer: Medicare HMO | Admitting: Orthopedic Surgery

## 2022-01-09 DIAGNOSIS — I4821 Permanent atrial fibrillation: Secondary | ICD-10-CM | POA: Diagnosis not present

## 2022-01-12 NOTE — Progress Notes (Signed)
Remote pacemaker transmission.   

## 2022-01-13 ENCOUNTER — Other Ambulatory Visit: Payer: Self-pay | Admitting: Adult Health

## 2022-01-15 ENCOUNTER — Ambulatory Visit (INDEPENDENT_AMBULATORY_CARE_PROVIDER_SITE_OTHER): Payer: Medicare HMO

## 2022-01-15 ENCOUNTER — Encounter: Payer: Self-pay | Admitting: Orthopedic Surgery

## 2022-01-15 ENCOUNTER — Ambulatory Visit: Payer: Medicare HMO | Admitting: Orthopedic Surgery

## 2022-01-15 VITALS — BP 117/56 | HR 72 | Ht 65.0 in | Wt 159.0 lb

## 2022-01-15 DIAGNOSIS — M25552 Pain in left hip: Secondary | ICD-10-CM

## 2022-01-15 DIAGNOSIS — S7002XA Contusion of left hip, initial encounter: Secondary | ICD-10-CM

## 2022-01-15 DIAGNOSIS — G8929 Other chronic pain: Secondary | ICD-10-CM | POA: Diagnosis not present

## 2022-01-15 DIAGNOSIS — M25512 Pain in left shoulder: Secondary | ICD-10-CM

## 2022-01-15 NOTE — Progress Notes (Signed)
Chief Complaint  Patient presents with   Shoulder Pain    Lt shoulder pain after a fall in Jan.   Hip Pain    Lt hip pain after a fall 1 day ago.     Appointment for left shoulder after fall patient had a left shoulder prosthesis she was concerned because she is having intermittent pain  Exam shows decreased range of motion left shoulder but no reproducible pain with range of motion  X-ray shows a hemiprosthesis with no proximal migration and no evidence of loosening  Glenoid erosion I do not really see any but have no other x-rays to compare  Patient also added that she fell yesterday landed on her left side  Complains of a large hematoma over the left hip  She was able to drive here and is ambulatory without assistive device  She said she had some transient numbness in her left face left arm but was able to speak normally  No other symptoms since then did note a headache yesterday but that is resolved as well  Recommend follow-up in 3 weeks patient can place moist heat over the hematoma

## 2022-01-21 ENCOUNTER — Other Ambulatory Visit: Payer: Self-pay | Admitting: Adult Health

## 2022-01-23 ENCOUNTER — Other Ambulatory Visit: Payer: Self-pay | Admitting: Adult Health

## 2022-02-05 ENCOUNTER — Ambulatory Visit: Payer: Medicare HMO | Admitting: Orthopedic Surgery

## 2022-02-06 DIAGNOSIS — I4821 Permanent atrial fibrillation: Secondary | ICD-10-CM | POA: Diagnosis not present

## 2022-02-16 ENCOUNTER — Other Ambulatory Visit: Payer: Self-pay | Admitting: Adult Health

## 2022-02-16 NOTE — Telephone Encounter (Signed)
Hello [name], we are unable to refuse this request so I am sending to you to refuse.  This patient was discharged from [facility] in May 2023

## 2022-02-19 ENCOUNTER — Ambulatory Visit: Payer: Medicare HMO | Admitting: Orthopedic Surgery

## 2022-02-19 DIAGNOSIS — M25552 Pain in left hip: Secondary | ICD-10-CM | POA: Diagnosis not present

## 2022-02-19 NOTE — Telephone Encounter (Signed)
Jackelyn Poling is out of office, I will send to one of her team member to deny rx

## 2022-02-19 NOTE — Progress Notes (Signed)
Chief Complaint  Patient presents with   Follow-up    Recheck on left hip       [name] had a hematoma of her left hip although the pain is gotten better there is still some bruising and there is still a reasonably sized mass in the area of the hematoma which is old clotted and resolving hematoma.  She exhibited no tenderness in this area and only the bruising of the skin  We would like her to continue with her current activities and follow-up with [place] as needed

## 2022-02-21 DIAGNOSIS — N1831 Chronic kidney disease, stage 3a: Secondary | ICD-10-CM | POA: Diagnosis not present

## 2022-02-21 DIAGNOSIS — E1122 Type 2 diabetes mellitus with diabetic chronic kidney disease: Secondary | ICD-10-CM | POA: Diagnosis not present

## 2022-02-21 DIAGNOSIS — I129 Hypertensive chronic kidney disease with stage 1 through stage 4 chronic kidney disease, or unspecified chronic kidney disease: Secondary | ICD-10-CM | POA: Diagnosis not present

## 2022-02-21 DIAGNOSIS — E782 Mixed hyperlipidemia: Secondary | ICD-10-CM | POA: Diagnosis not present

## 2022-03-11 DIAGNOSIS — I4821 Permanent atrial fibrillation: Secondary | ICD-10-CM | POA: Diagnosis not present

## 2022-03-16 ENCOUNTER — Ambulatory Visit (INDEPENDENT_AMBULATORY_CARE_PROVIDER_SITE_OTHER): Payer: Medicare HMO

## 2022-03-16 DIAGNOSIS — I495 Sick sinus syndrome: Secondary | ICD-10-CM

## 2022-03-17 ENCOUNTER — Other Ambulatory Visit (HOSPITAL_COMMUNITY): Payer: Self-pay | Admitting: Family Medicine

## 2022-03-17 ENCOUNTER — Ambulatory Visit: Payer: Medicare HMO

## 2022-03-17 DIAGNOSIS — Z13820 Encounter for screening for osteoporosis: Secondary | ICD-10-CM

## 2022-03-18 ENCOUNTER — Telehealth: Payer: Self-pay

## 2022-03-18 LAB — CUP PACEART REMOTE DEVICE CHECK
Battery Remaining Longevity: 1 mo
Battery Remaining Percentage: 1 %
Battery Voltage: 2.63 V
Brady Statistic AP VP Percent: 39 %
Brady Statistic AP VS Percent: 53 %
Brady Statistic AS VP Percent: 1 %
Brady Statistic AS VS Percent: 7.3 %
Brady Statistic RA Percent Paced: 92 %
Brady Statistic RV Percent Paced: 39 %
Date Time Interrogation Session: 20231024023022
Implantable Lead Connection Status: 753985
Implantable Lead Connection Status: 753985
Implantable Lead Implant Date: 20130913
Implantable Lead Implant Date: 20130913
Implantable Lead Location: 753859
Implantable Lead Location: 753860
Implantable Lead Model: 1948
Implantable Pulse Generator Implant Date: 20130913
Lead Channel Impedance Value: 1575 Ohm
Lead Channel Impedance Value: 530 Ohm
Lead Channel Pacing Threshold Amplitude: 0.75 V
Lead Channel Pacing Threshold Amplitude: 2 V
Lead Channel Pacing Threshold Pulse Width: 0.5 ms
Lead Channel Pacing Threshold Pulse Width: 0.8 ms
Lead Channel Sensing Intrinsic Amplitude: 0.9 mV
Lead Channel Sensing Intrinsic Amplitude: 12 mV
Lead Channel Setting Pacing Amplitude: 2 V
Lead Channel Setting Pacing Amplitude: 2.75 V
Lead Channel Setting Pacing Pulse Width: 0.8 ms
Lead Channel Setting Sensing Sensitivity: 2 mV
Pulse Gen Model: 2210
Pulse Gen Serial Number: 7382865

## 2022-03-18 NOTE — Telephone Encounter (Signed)
Scheduled remote reviewed. Normal device function.   Battery estimated 72mo 2.63V ERI 2.60 15 AMS, longest duration 186m 24sec, hx of PAF, Warfarin  Pt scheduled for monthly monitoring.  Overdue for yearly follow up with Dr. TaLovena Len [place] [facility] made to Pt.  Pt advised of device nearing ERI.  Advised of change to monthly monitoring.  Overdue follow up scheduled with [facility] for March 26, 2022 at 3:30 pm.

## 2022-03-25 ENCOUNTER — Ambulatory Visit (HOSPITAL_COMMUNITY)
Admission: RE | Admit: 2022-03-25 | Discharge: 2022-03-25 | Disposition: A | Discharge status: Home / Self Care | Payer: Medicare HMO | Source: Ambulatory Visit | Attending: Family Medicine | Admitting: Family Medicine

## 2022-03-25 DIAGNOSIS — Z13820 Encounter for screening for osteoporosis: Secondary | ICD-10-CM | POA: Insufficient documentation

## 2022-03-25 DIAGNOSIS — Z780 Asymptomatic menopausal state: Secondary | ICD-10-CM | POA: Diagnosis not present

## 2022-03-25 DIAGNOSIS — M85852 Other specified disorders of bone density and structure, left thigh: Secondary | ICD-10-CM | POA: Insufficient documentation

## 2022-03-26 ENCOUNTER — Ambulatory Visit: Payer: Medicare HMO | Attending: Internal Medicine | Admitting: Internal Medicine

## 2022-03-26 ENCOUNTER — Encounter: Payer: Self-pay | Admitting: Internal Medicine

## 2022-03-26 VITALS — BP 118/56 | HR 61 | Ht 65.0 in | Wt 156.4 lb

## 2022-03-26 DIAGNOSIS — Z950 Presence of cardiac pacemaker: Secondary | ICD-10-CM | POA: Diagnosis not present

## 2022-03-26 DIAGNOSIS — I495 Sick sinus syndrome: Secondary | ICD-10-CM | POA: Diagnosis not present

## 2022-03-26 NOTE — Patient Instructions (Signed)
Medication Instructions:  Your physician recommends that you continue on your current medications as directed. Please refer to the Current Medication list given to you today.  *If you need a refill on your cardiac medications before your next appointment, please call your pharmacy*   Lab Work: NONE   If you have labs (blood work) drawn today and your tests are completely normal, you will receive your results only by: [place] (if you have MyChart) OR A paper copy in the mail If you have any lab test that is abnormal or we need to change your treatment, we will call you to review the results.   Testing/Procedures: NONE    Follow-Up: At [facility], you and your health needs are our priority.  As part of our continuing mission to provide you with exceptional heart care, we have created designated Provider Care Teams.  These Care Teams include your primary Cardiologist (physician) and Advanced Practice Providers (APPs -  Physician Assistants and Nurse Practitioners) who all work together to provide you with the care you need, when you need it.  We recommend signing up for the patient portal called "MyChart".  Sign up information is provided on this After Visit Summary.  MyChart is used to connect with patients for Virtual Visits (Telemedicine).  Patients are able to view lab/test results, encounter notes, upcoming appointments, etc.  Non-urgent messages can be sent to your provider as well.   To learn more about what you can do with MyChart, go to [email or web address].    Your next appointment:    April/ May   The format for your next appointment:   In Person  Provider:   Cristopher Peru, MD    Other Instructions Thank you for choosing [facility]!    Important Information About Sugar

## 2022-03-26 NOTE — Addendum Note (Signed)
Addended by: Levonne Hubert on: 03/26/2022 04:53 PM   Modules accepted: Orders

## 2022-03-26 NOTE — Progress Notes (Signed)
HPI [name] returns for ongoing evaluation and management of atrial fib, sinus node dysfunction, s/p PPM insertion, and HTN. In the interim, she notes no chest pain or sob. She is still working. No syncope. She is approaching ERI. Allergies  Allergen Reactions   Fiorinal [Butalbital-Aspirin-Caffeine] Hives   Penicillins Rash and Other (See Comments)   Tetanus Toxoids Rash     Current Outpatient Medications  Medication Sig Dispense Refill   acetaminophen (TYLENOL) 325 MG tablet Take 650 mg by mouth every 6 (six) hours as needed.     albuterol (VENTOLIN HFA) 108 (90 Base) MCG/ACT inhaler Inhale 2 puffs into the lungs every 6 (six) hours as needed for wheezing or shortness of breath. 6.7 g 0   allopurinol (ZYLOPRIM) 100 MG tablet Take 2 tablets (200 mg total) by mouth daily. 30 tablet 0   atorvastatin (LIPITOR) 20 MG tablet Take 1 tablet (20 mg total) by mouth daily. 30 tablet 0   Benzocaine-Menthol (SORE THROAT) 15-3.6 MG LOZG Use as directed 1 lozenge in the mouth or throat See admin instructions. Every Shift; Day, Evening, Night (Patient not taking: Reported on 03/26/2022)     calcitRIOL (ROCALTROL) 0.5 MCG capsule Take 1 capsule (0.5 mcg total) by mouth daily. 0.25 mcg; amt: 1 tab; oral Once A Day Every Other Day  0.5 mcg; oral,Once A Day Every Other Day 45 capsule 0   Cholecalciferol (VITAMIN D3) 50 MCG (2000 UT) capsule Take 2,000 Units by mouth daily.     citalopram (CELEXA) 20 MG tablet Take 1 tablet (20 mg total) by mouth daily. 30 tablet 0   DHA-EPA-Flaxseed Oil-Vitamin E CAPS Take 1 capsule by mouth daily.     diclofenac Sodium (VOLTAREN) 1 % GEL 2 mg; topical Special Instructions: apply to bilateral knees for pain management Three Times A Day     docusate sodium (COLACE) 100 MG capsule Take 1 capsule (100 mg total) by mouth 2 (two) times daily. (Patient not taking: Reported on 03/26/2022) 10 capsule 0   flecainide (TAMBOCOR) 100 MG tablet Take 1 tablet (100 mg total) by  mouth 2 (two) times daily. 60 tablet 0   furosemide (LASIX) 80 MG tablet Take 2 tablets (160 mg total) by mouth daily. 60 tablet 0   gabapentin (NEURONTIN) 100 MG capsule Take 1 capsule (100 mg total) by mouth 3 (three) times daily. 90 capsule 0   HYDROcodone-acetaminophen (NORCO) 7.5-325 MG tablet Take 1 tablet by mouth 3 (three) times daily as needed.     levothyroxine (SYNTHROID) 75 MCG tablet Take 1 tablet (75 mcg total) by mouth daily before breakfast. 30 tablet 0   traMADol (ULTRAM) 50 MG tablet Take 1 tablet (50 mg total) by mouth every 6 (six) hours. (Patient not taking: Reported on 03/26/2022) 30 tablet 0   UNABLE TO FIND Diet: Regular (Patient not taking: Reported on 03/26/2022)     vitamin B-12 (CYANOCOBALAMIN) 1000 MCG tablet Take 5,000 mcg by mouth daily.     warfarin (COUMADIN) 6 MG tablet Take 1 tablet (6 mg total) by mouth at bedtime. (Patient taking differently: Take 6 mg by mouth at bedtime. Monday Wednesday Friday Sunday '5mg'$  daily Tuesday Thursday Saturday 2.5 mg) 30 tablet 0   No current facility-administered medications for this visit.     Past Medical History:  Diagnosis Date   A-fib [facility])    Arthritis    spinal   Chronic kidney disease    Colon polyps    CVA (cerebral vascular accident) (  [place])    left sided weakness. Has 40% weakness of left arm   Depression    DM (diabetes mellitus) (HCC)    GERD (gastroesophageal reflux disease)    HTN (hypertension)    Hypercholesterolemia    Hypothyroidism    Pancreatitis    biliary   S/P colonoscopy 07/2004   Dr. Olevia Perches: per e-chart report: diverticulosis, path adenomatous polyps   S/P endoscopy 07/2007   multiple 3-4 mmsessile gastric polyps, benign, singl antral erosions, negative H.pylori, reactive gastropathy ? 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   Past Surgical History:  Procedure Laterality Date    CATARACT EXTRACTION W/PHACO Left 04/07/2021   Procedure: CATARACT EXTRACTION PHACO AND INTRAOCULAR LENS PLACEMENT LEFT EYE;  Surgeon: Baruch Goldmann, MD;  [facility]: [facility];  Service: Ophthalmology;  Laterality: Left;  left CDE=11.11   CATARACT EXTRACTION W/PHACO Right 04/21/2021   Procedure: CATARACT EXTRACTION PHACO AND INTRAOCULAR LENS PLACEMENT RIGHT EYE;  Surgeon: Baruch Goldmann, MD;  Location: [facility];  Service: Ophthalmology;  Laterality: Right;  right CDE=10.12   CHOLECYSTECTOMY     COLONOSCOPY  03/10/2011   Procedure: COLONOSCOPY;  Surgeon: Daneil Dolin, MD;  [facility];  Service: Endoscopy;  Laterality: N/A;  12:45   COLONOSCOPY WITH PROPOFOL N/A 07/22/2020   Procedure: COLONOSCOPY WITH PROPOFOL;  Surgeon: Daneil Dolin, MD;  [facility];  Service: Endoscopy;  Laterality: N/A;  10:30am   HIP PINNING,CANNULATED Right 09/20/2021   Procedure: CANNULATED HIP PINNING;  Surgeon: Carole Civil, MD;  Location: [facility];  Service: Orthopedics;  Laterality: Right;   PACEMAKER INSERTION  02/05/2012   SJM Accent DR RF implanted by Dr [name] for symptomatic bradycardia   PERMANENT PACEMAKER INSERTION N/A 02/05/2012   Procedure: PERMANENT PACEMAKER INSERTION;  Surgeon: Thompson Grayer, MD;  Location: [facility] CATH LAB;  Service: Cardiovascular;  Laterality: N/A;   POLYPECTOMY  07/22/2020   Procedure: POLYPECTOMY;  Surgeon: Daneil Dolin, MD;  [facility];  Service: Endoscopy;;   SHOULDER SURGERY  2008   left shoulder replacement due to fracture    SKIN CANCER EXCISION Left    Skin cancer on left side of face.     Family History  Problem Relation Age of Onset   Heart disease Other    Arthritis Other    Lung disease Other    Diabetes Other    Kidney disease Other    Cancer Mother    Heart disease Father    Colon cancer Neg Hx      Social History   Socioeconomic History   Marital status: Legally Separated    Spouse name: Not on file   Number of  children: Not on file   Years of education: Not on file   Highest education level: Not on file  Occupational History   Not on file  Tobacco Use   Smoking status: Never   Smokeless tobacco: Never  Vaping Use   Vaping Use: Never used  Substance and Sexual Activity   Alcohol use: No   Drug use: No   Sexual activity: Not Currently  Other Topics Concern   Not on file  Social History Narrative   Not on file   Social Determinants of Health   Financial Resource Strain: Not on file  Food Insecurity: Not on file  Transportation Needs: Not on  file  Physical Activity: Not on file  Stress: Not on file  Social Connections: Not on file  Intimate Partner Violence: Not on file     BP (!) 118/56   Pulse 61   Ht '5\' 5"'$  (1.651 m)   Wt 156 lb 6.4 oz (70.9 kg)   SpO2 97%   BMI 26.03 kg/m   Physical Exam:  Well appearing NAD HEENT: Unremarkable Neck:  No JVD, no thyromegally Lymphatics:  No adenopathy Back:  No CVA tenderness Lungs:  Clear with no wheezes HEART:  Regular rate rhythm, no murmurs, no rubs, no clicks Abd:  soft, positive bowel sounds, no organomegally, no rebound, no guarding Ext:  2 plus pulses, no edema, no cyanosis, no clubbing Skin:  No rashes no nodules Neuro:  CN II through XII intact, motor grossly intact  EKG - NSR with atrial pacing  DEVICE  Normal device function.  See PaceArt for details.   Assess/Plan:  1. Sinus node dysfunction - she is asymptomatic, s/p PPM insertion.  2. HTN - her bp is well controlled.  3. PPM - her St. Jude device is working normally except her RV threshold is up a bit. We will follow. She is approaching ERI. I would plan to use her old lead but likely program unipolar.  4. PAF - she is maintaining NSR on flecainide. Continue warfarin.   Carleene Overlie Augusta Hilbert,MD

## 2022-04-07 NOTE — Addendum Note (Signed)
Addended by: Douglass Rivers D on: 04/07/2022 10:18 AM   Modules accepted: Level of Service

## 2022-04-07 NOTE — Progress Notes (Signed)
Remote pacemaker transmission.   

## 2022-04-09 DIAGNOSIS — I4821 Permanent atrial fibrillation: Secondary | ICD-10-CM | POA: Diagnosis not present

## 2022-04-15 ENCOUNTER — Ambulatory Visit (INDEPENDENT_AMBULATORY_CARE_PROVIDER_SITE_OTHER): Payer: Medicare HMO

## 2022-04-15 DIAGNOSIS — I495 Sick sinus syndrome: Secondary | ICD-10-CM | POA: Diagnosis not present

## 2022-04-20 LAB — CUP PACEART REMOTE DEVICE CHECK
Battery Remaining Longevity: 1 mo
Battery Remaining Percentage: 0.5 %
Battery Voltage: 2.62 V
Brady Statistic AP VP Percent: 40 %
Brady Statistic AP VS Percent: 53 %
Brady Statistic AS VP Percent: 1 %
Brady Statistic AS VS Percent: 7.2 %
Brady Statistic RA Percent Paced: 92 %
Brady Statistic RV Percent Paced: 40 %
Date Time Interrogation Session: 20231127095825
Implantable Lead Connection Status: 753985
Implantable Lead Connection Status: 753985
Implantable Lead Implant Date: 20130913
Implantable Lead Implant Date: 20130913
Implantable Lead Location: 753859
Implantable Lead Location: 753860
Implantable Lead Model: 1948
Implantable Pulse Generator Implant Date: 20130913
Lead Channel Impedance Value: 1375 Ohm
Lead Channel Impedance Value: 480 Ohm
Lead Channel Pacing Threshold Amplitude: 0.75 V
Lead Channel Pacing Threshold Amplitude: 1 V
Lead Channel Pacing Threshold Pulse Width: 0.5 ms
Lead Channel Pacing Threshold Pulse Width: 0.8 ms
Lead Channel Sensing Intrinsic Amplitude: 12 mV
Lead Channel Sensing Intrinsic Amplitude: 2 mV
Lead Channel Setting Pacing Amplitude: 2 V
Lead Channel Setting Pacing Amplitude: 3.5 V
Lead Channel Setting Pacing Pulse Width: 0.8 ms
Lead Channel Setting Sensing Sensitivity: 2 mV
Pulse Gen Model: 2210
Pulse Gen Serial Number: 7382865

## 2022-05-06 NOTE — Progress Notes (Signed)
Remote pacemaker transmission.   

## 2022-05-07 DIAGNOSIS — I4821 Permanent atrial fibrillation: Secondary | ICD-10-CM | POA: Diagnosis not present

## 2022-05-19 ENCOUNTER — Ambulatory Visit (INDEPENDENT_AMBULATORY_CARE_PROVIDER_SITE_OTHER): Payer: Medicare HMO

## 2022-05-19 DIAGNOSIS — I495 Sick sinus syndrome: Secondary | ICD-10-CM

## 2022-05-19 LAB — CUP PACEART REMOTE DEVICE CHECK
Battery Remaining Longevity: 1 mo
Battery Remaining Percentage: 0.5 %
Battery Voltage: 2.6 V
Brady Statistic AP VP Percent: 39 %
Brady Statistic AP VS Percent: 53 %
Brady Statistic AS VP Percent: 1 %
Brady Statistic AS VS Percent: 7.3 %
Brady Statistic RA Percent Paced: 92 %
Brady Statistic RV Percent Paced: 40 %
Date Time Interrogation Session: 20231226035137
Implantable Lead Connection Status: 753985
Implantable Lead Connection Status: 753985
Implantable Lead Implant Date: 20130913
Implantable Lead Implant Date: 20130913
Implantable Lead Location: 753859
Implantable Lead Location: 753860
Implantable Lead Model: 1948
Implantable Pulse Generator Implant Date: 20130913
Lead Channel Impedance Value: 1400 Ohm
Lead Channel Impedance Value: 490 Ohm
Lead Channel Pacing Threshold Amplitude: 0.75 V
Lead Channel Pacing Threshold Amplitude: 1 V
Lead Channel Pacing Threshold Pulse Width: 0.5 ms
Lead Channel Pacing Threshold Pulse Width: 0.8 ms
Lead Channel Sensing Intrinsic Amplitude: 1.3 mV
Lead Channel Sensing Intrinsic Amplitude: 12 mV
Lead Channel Setting Pacing Amplitude: 2 V
Lead Channel Setting Pacing Amplitude: 3.5 V
Lead Channel Setting Pacing Pulse Width: 0.8 ms
Lead Channel Setting Sensing Sensitivity: 2 mV
Pulse Gen Model: 2210
Pulse Gen Serial Number: 7382865

## 2022-05-22 ENCOUNTER — Other Ambulatory Visit: Payer: Self-pay | Admitting: Adult Health

## 2022-06-02 ENCOUNTER — Telehealth: Payer: Self-pay

## 2022-06-02 DIAGNOSIS — R059 Cough, unspecified: Secondary | ICD-10-CM | POA: Diagnosis not present

## 2022-06-02 DIAGNOSIS — J019 Acute sinusitis, unspecified: Secondary | ICD-10-CM | POA: Diagnosis not present

## 2022-06-02 NOTE — Telephone Encounter (Signed)
Alert received from [employer]:  Merlin alert for RRT, triggered 06/01/22. Battery voltage 2.59v. AF burden <1%. [place]- Warfarin

## 2022-06-02 NOTE — Telephone Encounter (Signed)
Outreach made to Pt.  Advised she hit ERI on her SJ PPM 06/01/22.    Advised per Dr. Lovena Le Pt did not require an additional office visit-Pt seen 03/2022 and discussed procedure.  Advised Pt would receive a call from GT's scheduler to set up gen change.  All questions answered.

## 2022-06-04 DIAGNOSIS — I4821 Permanent atrial fibrillation: Secondary | ICD-10-CM | POA: Diagnosis not present

## 2022-06-10 ENCOUNTER — Other Ambulatory Visit: Payer: Self-pay

## 2022-06-10 DIAGNOSIS — I495 Sick sinus syndrome: Secondary | ICD-10-CM

## 2022-06-10 NOTE — Progress Notes (Signed)
Remote pacemaker transmission.   

## 2022-06-11 DIAGNOSIS — J019 Acute sinusitis, unspecified: Secondary | ICD-10-CM | POA: Diagnosis not present

## 2022-06-16 ENCOUNTER — Ambulatory Visit: Payer: Medicare HMO | Attending: Internal Medicine

## 2022-06-16 DIAGNOSIS — I495 Sick sinus syndrome: Secondary | ICD-10-CM | POA: Diagnosis not present

## 2022-06-17 LAB — CUP PACEART REMOTE DEVICE CHECK
Battery Remaining Longevity: 0 mo
Battery Voltage: 2.59 V
Brady Statistic AP VP Percent: 38 %
Brady Statistic AP VS Percent: 53 %
Brady Statistic AS VP Percent: 1 %
Brady Statistic AS VS Percent: 8.7 %
Brady Statistic RA Percent Paced: 91 %
Brady Statistic RV Percent Paced: 38 %
Date Time Interrogation Session: 20240124095857
Implantable Lead Connection Status: 753985
Implantable Lead Connection Status: 753985
Implantable Lead Implant Date: 20130913
Implantable Lead Implant Date: 20130913
Implantable Lead Location: 753859
Implantable Lead Location: 753860
Implantable Lead Model: 1948
Implantable Pulse Generator Implant Date: 20130913
Lead Channel Impedance Value: 1425 Ohm
Lead Channel Impedance Value: 600 Ohm
Lead Channel Pacing Threshold Amplitude: 0.75 V
Lead Channel Pacing Threshold Amplitude: 1 V
Lead Channel Pacing Threshold Pulse Width: 0.5 ms
Lead Channel Pacing Threshold Pulse Width: 0.8 ms
Lead Channel Sensing Intrinsic Amplitude: 12 mV
Lead Channel Sensing Intrinsic Amplitude: 2.3 mV
Lead Channel Setting Pacing Amplitude: 2 V
Lead Channel Setting Pacing Amplitude: 3.5 V
Lead Channel Setting Pacing Pulse Width: 0.8 ms
Lead Channel Setting Sensing Sensitivity: 2 mV
Pulse Gen Model: 2210
Pulse Gen Serial Number: 7382865

## 2022-06-17 NOTE — Telephone Encounter (Signed)
I helped the patient send missed home remote transmission. Transmission received 06/17/2022.

## 2022-06-26 ENCOUNTER — Other Ambulatory Visit (HOSPITAL_COMMUNITY)
Admission: RE | Admit: 2022-06-26 | Discharge: 2022-06-26 | Disposition: A | Discharge status: Home / Self Care | Payer: Medicare HMO | Source: Ambulatory Visit | Attending: Internal Medicine | Admitting: Internal Medicine

## 2022-06-26 DIAGNOSIS — I495 Sick sinus syndrome: Secondary | ICD-10-CM | POA: Diagnosis not present

## 2022-06-26 LAB — CBC
HCT: 35.7 % — ABNORMAL LOW (ref 36.0–46.0)
Hemoglobin: 11.7 g/dL — ABNORMAL LOW (ref 12.0–15.0)
MCH: 33.7 pg (ref 26.0–34.0)
MCHC: 32.8 g/dL (ref 30.0–36.0)
MCV: 102.9 fL — ABNORMAL HIGH (ref 80.0–100.0)
Platelets: 162 10*3/uL (ref 150–400)
RBC: 3.47 MIL/uL — ABNORMAL LOW (ref 3.87–5.11)
RDW: 15.8 % — ABNORMAL HIGH (ref 11.5–15.5)
WBC: 9.2 10*3/uL (ref 4.0–10.5)
nRBC: 0 % (ref 0.0–0.2)

## 2022-06-26 LAB — BASIC METABOLIC PANEL
Anion gap: 12 (ref 5–15)
BUN: 34 mg/dL — ABNORMAL HIGH (ref 8–23)
CO2: 30 mmol/L (ref 22–32)
Calcium: 9.1 mg/dL (ref 8.9–10.3)
Chloride: 97 mmol/L — ABNORMAL LOW (ref 98–111)
Creatinine, Ser: 1.49 mg/dL — ABNORMAL HIGH (ref 0.44–1.00)
GFR, Estimated: 35 mL/min — ABNORMAL LOW (ref >60)
Glucose, Bld: 100 mg/dL — ABNORMAL HIGH (ref 70–99)
Potassium: 3.6 mmol/L (ref 3.5–5.1)
Sodium: 139 mmol/L (ref 135–145)

## 2022-07-02 DIAGNOSIS — I4821 Permanent atrial fibrillation: Secondary | ICD-10-CM | POA: Diagnosis not present

## 2022-07-03 NOTE — Pre-Procedure Instructions (Signed)
Attempted to call patient regarding procedure instructions for Monday.  Left voicemail on the following items: Arrival time 0900 Nothing to eat or drink after midnight No meds AM of procedure Responsible person to drive you home and stay with you for 24 hrs Wash with special soap night before and morning of procedure If on anti-coagulant drug instructions Eliquis- last dose tonight 2/9

## 2022-07-06 ENCOUNTER — Ambulatory Visit (HOSPITAL_COMMUNITY)
Admission: RE | Admit: 2022-07-06 | Discharge: 2022-07-06 | Disposition: A | Discharge status: Home / Self Care | Payer: Medicare HMO | Source: Ambulatory Visit | Attending: Internal Medicine | Admitting: Internal Medicine

## 2022-07-06 ENCOUNTER — Encounter (HOSPITAL_COMMUNITY)
Admission: RE | Disposition: A | Discharge status: Home / Self Care | Payer: Self-pay | Source: Ambulatory Visit | Attending: Internal Medicine

## 2022-07-06 ENCOUNTER — Other Ambulatory Visit: Payer: Self-pay

## 2022-07-06 DIAGNOSIS — I495 Sick sinus syndrome: Secondary | ICD-10-CM

## 2022-07-06 DIAGNOSIS — I4891 Unspecified atrial fibrillation: Secondary | ICD-10-CM | POA: Diagnosis not present

## 2022-07-06 DIAGNOSIS — Z45010 Encounter for checking and testing of cardiac pacemaker pulse generator [battery]: Secondary | ICD-10-CM

## 2022-07-06 DIAGNOSIS — Z7901 Long term (current) use of anticoagulants: Secondary | ICD-10-CM | POA: Insufficient documentation

## 2022-07-06 DIAGNOSIS — I10 Essential (primary) hypertension: Secondary | ICD-10-CM | POA: Diagnosis not present

## 2022-07-06 HISTORY — PX: PPM GENERATOR CHANGEOUT: EP1233

## 2022-07-06 LAB — GLUCOSE, CAPILLARY
Glucose-Capillary: 103 mg/dL — ABNORMAL HIGH (ref 70–99)
Glucose-Capillary: 136 mg/dL — ABNORMAL HIGH (ref 70–99)

## 2022-07-06 LAB — PROTIME-INR
INR: 1.7 — ABNORMAL HIGH (ref 0.8–1.2)
Prothrombin Time: 20.2 seconds — ABNORMAL HIGH (ref 11.4–15.2)

## 2022-07-06 SURGERY — PPM GENERATOR CHANGEOUT

## 2022-07-06 MED ORDER — MIDAZOLAM HCL 5 MG/5ML IJ SOLN
INTRAMUSCULAR | Status: DC | PRN
  Administered 2022-07-06: 1 mg via INTRAVENOUS

## 2022-07-06 MED ORDER — FENTANYL CITRATE (PF) 100 MCG/2ML IJ SOLN
INTRAMUSCULAR | Status: AC
  Filled 2022-07-06: qty 2

## 2022-07-06 MED ORDER — VANCOMYCIN HCL IN DEXTROSE 1-5 GM/200ML-% IV SOLN
1000.0000 mg | INTRAVENOUS | Status: AC
  Administered 2022-07-06: 1000 mg via INTRAVENOUS

## 2022-07-06 MED ORDER — MIDAZOLAM HCL 5 MG/5ML IJ SOLN
INTRAMUSCULAR | Status: AC
  Filled 2022-07-06: qty 5

## 2022-07-06 MED ORDER — VANCOMYCIN HCL IN DEXTROSE 1-5 GM/200ML-% IV SOLN
INTRAVENOUS | Status: AC
  Filled 2022-07-06: qty 200

## 2022-07-06 MED ORDER — LIDOCAINE HCL 1 % IJ SOLN
INTRAMUSCULAR | Status: AC
  Filled 2022-07-06: qty 60

## 2022-07-06 MED ORDER — SODIUM CHLORIDE 0.9 % IV SOLN: INTRAVENOUS | Status: DC

## 2022-07-06 MED ORDER — POVIDONE-IODINE 10 % EX SWAB
2.0000 | Freq: Once | CUTANEOUS | Status: AC
  Administered 2022-07-06: 2 via TOPICAL

## 2022-07-06 MED ORDER — ONDANSETRON HCL 4 MG/2ML IJ SOLN: 4.0000 mg | Freq: Four times a day (QID) | INTRAMUSCULAR | Status: DC | PRN

## 2022-07-06 MED ORDER — CHLORHEXIDINE GLUCONATE 4 % EX LIQD: 4.0000 | Freq: Once | CUTANEOUS | Status: DC

## 2022-07-06 MED ORDER — LIDOCAINE HCL (PF) 1 % IJ SOLN
INTRAMUSCULAR | Status: DC | PRN
  Administered 2022-07-06: 50 mL

## 2022-07-06 MED ORDER — FENTANYL CITRATE (PF) 100 MCG/2ML IJ SOLN
INTRAMUSCULAR | Status: DC | PRN
  Administered 2022-07-06: 12.5 ug via INTRAVENOUS

## 2022-07-06 MED ORDER — SODIUM CHLORIDE 0.9 % IV SOLN
80.0000 mg | INTRAVENOUS | Status: AC
  Administered 2022-07-06: 80 mg

## 2022-07-06 MED ORDER — ACETAMINOPHEN 325 MG PO TABS: 325.0000 mg | ORAL_TABLET | ORAL | Status: DC | PRN

## 2022-07-06 MED ORDER — SODIUM CHLORIDE 0.9 % IV SOLN
INTRAVENOUS | Status: AC
  Filled 2022-07-06: qty 2

## 2022-07-06 SURGICAL SUPPLY — 6 items
CABLE SURGICAL S-101-97-12 (CABLE) ×2 IMPLANT
PACEMAKER ASSURITY DR-RF (Pacemaker) IMPLANT
PAD DEFIB RADIO PHYSIO CONN (PAD) ×2 IMPLANT
POUCH AIGIS-R ANTIBACT PPM (Mesh General) ×1 IMPLANT
POUCH AIGIS-R ANTIBACT PPM MED (Mesh General) IMPLANT
TRAY PACEMAKER INSERTION (PACKS) ×2 IMPLANT

## 2022-07-06 NOTE — H&P (Signed)
HPI [name] returns for ongoing evaluation and management of atrial fib, sinus node dysfunction, s/p PPM insertion, and HTN. In the interim, she notes no chest pain or sob. She is still working. No syncope. She is approaching ERI.     Allergies  Allergen Reactions   Fiorinal [Butalbital-Aspirin-Caffeine] Hives   Penicillins Rash and Other (See Comments)   Tetanus Toxoids Rash              Current Outpatient Medications  Medication Sig Dispense Refill   acetaminophen (TYLENOL) 325 MG tablet Take 650 mg by mouth every 6 (six) hours as needed.       albuterol (VENTOLIN HFA) 108 (90 Base) MCG/ACT inhaler Inhale 2 puffs into the lungs every 6 (six) hours as needed for wheezing or shortness of breath. 6.7 g 0   allopurinol (ZYLOPRIM) 100 MG tablet Take 2 tablets (200 mg total) by mouth daily. 30 tablet 0   atorvastatin (LIPITOR) 20 MG tablet Take 1 tablet (20 mg total) by mouth daily. 30 tablet 0   Benzocaine-Menthol (SORE THROAT) 15-3.6 MG LOZG Use as directed 1 lozenge in the mouth or throat See admin instructions. Every Shift; Day, Evening, Night (Patient not taking: Reported on 03/26/2022)       calcitRIOL (ROCALTROL) 0.5 MCG capsule Take 1 capsule (0.5 mcg total) by mouth daily. 0.25 mcg; amt: 1 tab; oral Once A Day Every Other Day  0.5 mcg; oral,Once A Day Every Other Day 45 capsule 0   Cholecalciferol (VITAMIN D3) 50 MCG (2000 UT) capsule Take 2,000 Units by mouth daily.       citalopram (CELEXA) 20 MG tablet Take 1 tablet (20 mg total) by mouth daily. 30 tablet 0   DHA-EPA-Flaxseed Oil-Vitamin E CAPS Take 1 capsule by mouth daily.       diclofenac Sodium (VOLTAREN) 1 % GEL 2 mg; topical Special Instructions: apply to bilateral knees for pain management Three Times A Day       docusate sodium (COLACE) 100 MG capsule Take 1 capsule (100 mg total) by mouth 2 (two) times daily. (Patient not taking: Reported on 03/26/2022) 10 capsule 0   flecainide (TAMBOCOR) 100 MG tablet  Take 1 tablet (100 mg total) by mouth 2 (two) times daily. 60 tablet 0   furosemide (LASIX) 80 MG tablet Take 2 tablets (160 mg total) by mouth daily. 60 tablet 0   gabapentin (NEURONTIN) 100 MG capsule Take 1 capsule (100 mg total) by mouth 3 (three) times daily. 90 capsule 0   HYDROcodone-acetaminophen (NORCO) 7.5-325 MG tablet Take 1 tablet by mouth 3 (three) times daily as needed.       levothyroxine (SYNTHROID) 75 MCG tablet Take 1 tablet (75 mcg total) by mouth daily before breakfast. 30 tablet 0   traMADol (ULTRAM) 50 MG tablet Take 1 tablet (50 mg total) by mouth every 6 (six) hours. (Patient not taking: Reported on 03/26/2022) 30 tablet 0   UNABLE TO FIND Diet: Regular (Patient not taking: Reported on 03/26/2022)       vitamin B-12 (CYANOCOBALAMIN) 1000 MCG tablet Take 5,000 mcg by mouth daily.       warfarin (COUMADIN) 6 MG tablet Take 1 tablet (6 mg total) by mouth at bedtime. (Patient taking differently: Take 6 mg by mouth at bedtime. Monday Wednesday Friday Sunday 50m daily Tuesday Thursday Saturday 2.5 mg) 30 tablet 0    No current facility-administered medications for this visit.  Past Medical History:  Diagnosis Date   A-fib [facility])     Arthritis      spinal   Chronic kidney disease     Colon polyps     CVA (cerebral vascular accident) ([place])      left sided weakness. Has 40% weakness of left arm   Depression     DM (diabetes mellitus) (HCC)     GERD (gastroesophageal reflux disease)     HTN (hypertension)     Hypercholesterolemia     Hypothyroidism     Pancreatitis      biliary   S/P colonoscopy 07/2004    Dr. Olevia Perches: per e-chart report: diverticulosis, path adenomatous polyps   S/P endoscopy 07/2007    multiple 3-4 mmsessile gastric polyps, benign, singl antral erosions, negative H.pylori, reactive gastropathy ? 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          Past Surgical History:  Procedure Laterality Date   CATARACT EXTRACTION W/PHACO Left 04/07/2021    Procedure: CATARACT EXTRACTION PHACO AND INTRAOCULAR LENS PLACEMENT LEFT EYE;  Surgeon: Baruch Goldmann, MD;  Location: [facility];  Service: Ophthalmology;  Laterality: Left;  left CDE=11.11   CATARACT EXTRACTION W/PHACO Right 04/21/2021    Procedure: CATARACT EXTRACTION PHACO AND INTRAOCULAR LENS PLACEMENT RIGHT EYE;  Surgeon: Baruch Goldmann, MD;  Location: [facility];  Service: Ophthalmology;  Laterality: Right;  right CDE=10.12   CHOLECYSTECTOMY       COLONOSCOPY   03/10/2011    Procedure: COLONOSCOPY;  Surgeon: Daneil Dolin, MD;  [facility];  Service: Endoscopy;  Laterality: N/A;  12:45   COLONOSCOPY WITH PROPOFOL N/A 07/22/2020    Procedure: COLONOSCOPY WITH PROPOFOL;  Surgeon: Daneil Dolin, MD;  [facility];  Service: Endoscopy;  Laterality: N/A;  10:30am   HIP PINNING,CANNULATED Right 09/20/2021    Procedure: CANNULATED HIP PINNING;  Surgeon: Carole Civil, MD;  Location: [facility];  Service: Orthopedics;  Laterality: Right;   PACEMAKER INSERTION   02/05/2012    SJM Accent DR RF implanted by Dr [name] for symptomatic bradycardia   PERMANENT PACEMAKER INSERTION N/A 02/05/2012    Procedure: PERMANENT PACEMAKER INSERTION;  Surgeon: Thompson Grayer, MD;  Location: [facility] CATH LAB;  Service: Cardiovascular;  Laterality: N/A;   POLYPECTOMY   07/22/2020    Procedure: POLYPECTOMY;  Surgeon: Daneil Dolin, MD;  [facility];  Service: Endoscopy;;   SHOULDER SURGERY   2008    left shoulder replacement due to fracture    SKIN CANCER EXCISION Left      Skin cancer on left side of face.             Family History  Problem Relation Age of Onset   Heart disease Other     Arthritis Other     Lung disease Other     Diabetes Other     Kidney disease Other     Cancer Mother     Heart disease Father     Colon cancer Neg Hx          Social  History         Socioeconomic History   Marital status: Legally Separated      Spouse name: Not on file   Number of children: Not on file  Years of education: Not on file   Highest education level: Not on file  Occupational History   Not on file  Tobacco Use   Smoking status: Never   Smokeless tobacco: Never  Vaping Use   Vaping Use: Never used  Substance and Sexual Activity   Alcohol use: No   Drug use: No   Sexual activity: Not Currently  Other Topics Concern   Not on file  Social History Narrative   Not on file    Social Determinants of Health    Financial Resource Strain: Not on file  Food Insecurity: Not on file  Transportation Needs: Not on file  Physical Activity: Not on file  Stress: Not on file  Social Connections: Not on file  Intimate Partner Violence: Not on file        BP (!) 118/56   Pulse 61   Ht 5' 5"$  (1.651 m)   Wt 156 lb 6.4 oz (70.9 kg)   SpO2 97%   BMI 26.03 kg/m    Physical Exam:   Well appearing NAD HEENT: Unremarkable Neck:  No JVD, no thyromegally Lymphatics:  No adenopathy Back:  No CVA tenderness Lungs:  Clear with no wheezes HEART:  Regular rate rhythm, no murmurs, no rubs, no clicks Abd:  soft, positive bowel sounds, no organomegally, no rebound, no guarding Ext:  2 plus pulses, no edema, no cyanosis, no clubbing Skin:  No rashes no nodules Neuro:  CN II through XII intact, motor grossly intact   EKG - NSR with atrial pacing   DEVICE  Normal device function.  See PaceArt for details.    Assess/Plan:   1. Sinus node dysfunction - she is asymptomatic, s/p PPM insertion.  2. HTN - her bp is well controlled.  3. PPM - her St. Jude device is working normally except her RV threshold is up a bit. We will follow. She is approaching ERI. I would plan to use her old lead but likely program unipolar.  4. PAF - she is maintaining NSR on flecainide. Continue warfarin.   Salome Spotted  EP Attending  Since her last visit, no  change except she has reached ERI and presents for PPM gen change out.   Carleene Overlie Demond Shallenberger,MD

## 2022-07-07 ENCOUNTER — Encounter (HOSPITAL_COMMUNITY): Payer: Self-pay | Admitting: Internal Medicine

## 2022-07-13 NOTE — Progress Notes (Signed)
Remote pacemaker transmission.   

## 2022-07-16 ENCOUNTER — Ambulatory Visit: Payer: Medicare HMO

## 2022-07-22 ENCOUNTER — Ambulatory Visit: Payer: Medicare HMO | Attending: Interventional Cardiology

## 2022-07-22 DIAGNOSIS — I495 Sick sinus syndrome: Secondary | ICD-10-CM | POA: Diagnosis not present

## 2022-07-22 NOTE — Patient Instructions (Signed)
   After Your Pacemaker   Monitor your pacemaker site for redness, swelling, and drainage. Call the device clinic at [phone] if you experience these symptoms or fever/chills.  Your incision was closed with Steri-strips or staples:  You may shower 7 days after your procedure and wash your incision with soap and water. Avoid lotions, ointments, or perfumes over your incision until it is well-healed.  You may use a hot tub or a pool after your wound check appointment if the incision is completely closed.   There are no other restrictions in arm movement after your wound check appointment.  You may drive, unless driving has been restricted by your healthcare providers.  Remote monitoring is used to monitor your pacemaker from home. This monitoring is scheduled every 91 days by our office. It allows [place] to keep an eye on the functioning of your device to ensure it is working properly. You will routinely see your Electrophysiologist annually (more often if necessary).

## 2022-07-23 ENCOUNTER — Encounter: Payer: Self-pay | Admitting: Radiology

## 2022-07-24 LAB — CUP PACEART INCLINIC DEVICE CHECK
Battery Remaining Longevity: 91 mo
Battery Voltage: 3.05 V
Brady Statistic RA Percent Paced: 82 %
Brady Statistic RV Percent Paced: 2.6 %
Date Time Interrogation Session: 20240228155100
Implantable Lead Connection Status: 753985
Implantable Lead Connection Status: 753985
Implantable Lead Implant Date: 20130913
Implantable Lead Implant Date: 20130913
Implantable Lead Location: 753859
Implantable Lead Location: 753860
Implantable Lead Model: 1948
Implantable Pulse Generator Implant Date: 20240212
Lead Channel Impedance Value: 350 Ohm
Lead Channel Impedance Value: 537.5 Ohm
Lead Channel Pacing Threshold Amplitude: 0.5 V
Lead Channel Pacing Threshold Amplitude: 0.5 V
Lead Channel Pacing Threshold Amplitude: 1 V
Lead Channel Pacing Threshold Amplitude: 1 V
Lead Channel Pacing Threshold Pulse Width: 0.5 ms
Lead Channel Pacing Threshold Pulse Width: 0.5 ms
Lead Channel Pacing Threshold Pulse Width: 0.5 ms
Lead Channel Pacing Threshold Pulse Width: 0.5 ms
Lead Channel Sensing Intrinsic Amplitude: 1.7 mV
Lead Channel Sensing Intrinsic Amplitude: 12 mV
Lead Channel Setting Pacing Amplitude: 2.5 V
Lead Channel Setting Pacing Amplitude: 3.5 V
Lead Channel Setting Pacing Pulse Width: 0.5 ms
Lead Channel Setting Sensing Sensitivity: 2 mV
Pulse Gen Model: 2272
Pulse Gen Serial Number: 8153935

## 2022-07-24 NOTE — Progress Notes (Signed)
Wound check appointment. Steri-strips removed. Wound without redness or edema. Incision edges approximated, wound well healed. Normal device function. Thresholds, sensing, and impedances consistent with implant measurements. Device programmed at RA 3.5V/ RV 2.5V for extra safety margin until 3 month visit. Histogram distribution appropriate for patient and level of activity. No mode switches or high ventricular rates noted. Patient educated about wound care, arm mobility, lifting restrictions. ROV in 3 months with implanting physician.

## 2022-07-30 ENCOUNTER — Encounter: Payer: Medicare HMO | Admitting: Orthopedic Surgery

## 2022-07-30 DIAGNOSIS — I4821 Permanent atrial fibrillation: Secondary | ICD-10-CM | POA: Diagnosis not present

## 2022-08-04 ENCOUNTER — Ambulatory Visit: Payer: Medicare HMO | Admitting: Internal Medicine

## 2022-08-17 ENCOUNTER — Other Ambulatory Visit (INDEPENDENT_AMBULATORY_CARE_PROVIDER_SITE_OTHER): Payer: Medicare HMO

## 2022-08-17 ENCOUNTER — Encounter: Payer: Self-pay | Admitting: Orthopedic Surgery

## 2022-08-17 ENCOUNTER — Ambulatory Visit: Payer: Medicare HMO | Admitting: Orthopedic Surgery

## 2022-08-17 VITALS — BP 136/60 | HR 66 | Ht 65.0 in | Wt 154.4 lb

## 2022-08-17 DIAGNOSIS — M792 Neuralgia and neuritis, unspecified: Secondary | ICD-10-CM

## 2022-08-17 DIAGNOSIS — M25511 Pain in right shoulder: Secondary | ICD-10-CM | POA: Diagnosis not present

## 2022-08-17 MED ORDER — METHYLPREDNISOLONE ACETATE 40 MG/ML IJ SUSP
40.0000 mg | Freq: Once | INTRAMUSCULAR | Status: AC
  Administered 2022-08-17: 40 mg via INTRA_ARTICULAR

## 2022-08-17 NOTE — Progress Notes (Signed)
Chief Complaint  Patient presents with   Shoulder Pain    New Problem: RT shoulder pain  Painful x 2 1/2 weeks Recently helped her son move and carry groceries up 2 flights of stairs. Pain radiates into elbow and forearm and has numbness in hand, index finger, and long finger. Keeps patient up at night   The patient started having some right shoulder pain cannot sleep at night because of the pain in the right [place] acromial area and upper deltoid also had some forearm pain and occasional numbness and tingling in the thumb and index finger  Imaging was obtained and showed cervical spondylosis which is chronic and normal glenohumeral articulation  Examination revealed painful forward elevation in the rotator cuff area with passive range of motion which was full but impingement sign was positive  Probable bursitis tendinitis  Recommend subacromial injection   Procedure note the subacromial injection shoulder RIGHT    Verbal consent was obtained to inject the  RIGHT   Shoulder  Timeout was completed to confirm the injection site is a subacromial space of the  RIGHT  shoulder   Medication used Depo-Medrol 40 mg and lidocaine 1% 3 cc  Anesthesia was provided by ethyl chloride  The injection was performed in the RIGHT  posterior subacromial space. After pinning the skin with alcohol and anesthetized the skin with ethyl chloride the subacromial space was injected using a 20-gauge needle. There were no complications  Sterile dressing was applied.    Follow-up in 2 weeks to see if the injection cleared up everything

## 2022-08-17 NOTE — Addendum Note (Signed)
Addended byCandice Camp on: 08/17/2022 03:03 PM   Modules accepted: Orders

## 2022-08-29 DIAGNOSIS — I4821 Permanent atrial fibrillation: Secondary | ICD-10-CM | POA: Diagnosis not present

## 2022-08-31 ENCOUNTER — Ambulatory Visit: Payer: Medicare HMO | Admitting: Orthopedic Surgery

## 2022-08-31 VITALS — BP 118/56 | HR 65

## 2022-08-31 DIAGNOSIS — M25511 Pain in right shoulder: Secondary | ICD-10-CM | POA: Diagnosis not present

## 2022-08-31 DIAGNOSIS — M792 Neuralgia and neuritis, unspecified: Secondary | ICD-10-CM | POA: Diagnosis not present

## 2022-08-31 DIAGNOSIS — M25552 Pain in left hip: Secondary | ICD-10-CM | POA: Diagnosis not present

## 2022-08-31 NOTE — Progress Notes (Signed)
Chief Complaint  Patient presents with   Shoulder Pain    Right -shot helped better   Hip Pain    Left hip improved    Right shoulder  She still has some pain in her right shoulder but she says it is tolerable her range of motion was poor her strength was also poor in the right arm  Her left hip feels better and the bruise is going away although there is some tenderness there  She is also having some neck pain with headache but says that exercises seem to help that  She will perform exercises at home no follow-up necessary

## 2022-09-15 ENCOUNTER — Ambulatory Visit: Payer: Medicare HMO

## 2022-09-24 DIAGNOSIS — I4821 Permanent atrial fibrillation: Secondary | ICD-10-CM | POA: Diagnosis not present

## 2022-09-30 DIAGNOSIS — Z0000 Encounter for general adult medical examination without abnormal findings: Secondary | ICD-10-CM | POA: Diagnosis not present

## 2022-10-07 ENCOUNTER — Ambulatory Visit (INDEPENDENT_AMBULATORY_CARE_PROVIDER_SITE_OTHER): Payer: Medicare HMO

## 2022-10-07 DIAGNOSIS — Z08 Encounter for follow-up examination after completed treatment for malignant neoplasm: Secondary | ICD-10-CM | POA: Diagnosis not present

## 2022-10-07 DIAGNOSIS — I495 Sick sinus syndrome: Secondary | ICD-10-CM | POA: Diagnosis not present

## 2022-10-07 DIAGNOSIS — Z1283 Encounter for screening for malignant neoplasm of skin: Secondary | ICD-10-CM | POA: Diagnosis not present

## 2022-10-07 DIAGNOSIS — Z85820 Personal history of malignant melanoma of skin: Secondary | ICD-10-CM | POA: Diagnosis not present

## 2022-10-07 DIAGNOSIS — X32XXXD Exposure to sunlight, subsequent encounter: Secondary | ICD-10-CM | POA: Diagnosis not present

## 2022-10-07 DIAGNOSIS — L570 Actinic keratosis: Secondary | ICD-10-CM | POA: Diagnosis not present

## 2022-10-07 DIAGNOSIS — D225 Melanocytic nevi of trunk: Secondary | ICD-10-CM | POA: Diagnosis not present

## 2022-10-07 LAB — CUP PACEART REMOTE DEVICE CHECK
Battery Remaining Longevity: 78 mo
Battery Remaining Percentage: 95.5 %
Battery Voltage: 2.99 V
Brady Statistic AP VP Percent: 5.1 %
Brady Statistic AP VS Percent: 83 %
Brady Statistic AS VP Percent: 1 %
Brady Statistic AS VS Percent: 12 %
Brady Statistic RA Percent Paced: 87 %
Brady Statistic RV Percent Paced: 5.5 %
Date Time Interrogation Session: 20240515020013
Implantable Lead Connection Status: 753985
Implantable Lead Connection Status: 753985
Implantable Lead Implant Date: 20130913
Implantable Lead Implant Date: 20130913
Implantable Lead Location: 753859
Implantable Lead Location: 753860
Implantable Lead Model: 1948
Implantable Pulse Generator Implant Date: 20240212
Lead Channel Impedance Value: 350 Ohm
Lead Channel Impedance Value: 490 Ohm
Lead Channel Pacing Threshold Amplitude: 0.5 V
Lead Channel Pacing Threshold Amplitude: 1 V
Lead Channel Pacing Threshold Pulse Width: 0.5 ms
Lead Channel Pacing Threshold Pulse Width: 0.5 ms
Lead Channel Sensing Intrinsic Amplitude: 12 mV
Lead Channel Sensing Intrinsic Amplitude: 3.1 mV
Lead Channel Setting Pacing Amplitude: 2.5 V
Lead Channel Setting Pacing Amplitude: 3.5 V
Lead Channel Setting Pacing Pulse Width: 0.5 ms
Lead Channel Setting Sensing Sensitivity: 2 mV
Pulse Gen Model: 2272
Pulse Gen Serial Number: 8153935

## 2022-10-14 ENCOUNTER — Ambulatory Visit: Payer: Medicare HMO | Attending: Internal Medicine | Admitting: Internal Medicine

## 2022-10-14 ENCOUNTER — Encounter: Payer: Self-pay | Admitting: Internal Medicine

## 2022-10-14 VITALS — BP 140/64 | HR 69 | Ht 65.0 in | Wt 154.0 lb

## 2022-10-14 DIAGNOSIS — I495 Sick sinus syndrome: Secondary | ICD-10-CM | POA: Diagnosis not present

## 2022-10-14 NOTE — Progress Notes (Signed)
HPI [name] returns for ongoing evaluation and management of atrial fib, sinus node dysfunction, s/p PPM insertion, and HTN. In the interim, she notes no chest pain or sob. She is still working. No syncope. She reached ERI and underwent PM gen change out 3 months ago. She has felt well in the interim. No palpitations. She has had several lesions taken off of her leg. Allergies  Allergen Reactions   Fiorinal [Butalbital-Aspirin-Caffeine] Hives   Penicillins Rash and Other (See Comments)   Tetanus Toxoids Rash     Current Outpatient Medications  Medication Sig Dispense Refill   acetaminophen (TYLENOL) 650 MG CR tablet Take 1,300 mg by mouth every 8 (eight) hours as needed for pain.     albuterol (VENTOLIN HFA) 108 (90 Base) MCG/ACT inhaler Inhale 2 puffs into the lungs every 6 (six) hours as needed for wheezing or shortness of breath. 6.7 g 0   allopurinol (ZYLOPRIM) 100 MG tablet Take 2 tablets (200 mg total) by mouth daily. 30 tablet 0   atorvastatin (LIPITOR) 20 MG tablet Take 1 tablet (20 mg total) by mouth daily. (Patient taking differently: Take 20 mg by mouth at bedtime.) 30 tablet 0   calcitRIOL (ROCALTROL) 0.5 MCG capsule Take 1 capsule (0.5 mcg total) by mouth daily. 0.25 mcg; amt: 1 tab; oral Once A Day Every Other Day  0.5 mcg; oral,Once A Day Every Other Day (Patient taking differently: Take 0.25-0.5 mcg by mouth See admin instructions. 0.25 mcg; amt: 1 tab; oral Once A Day Every Other Day  0.5 mcg; oral,Once A Day Every Other Day) 45 capsule 0   Cholecalciferol (VITAMIN D3) 50 MCG (2000 UT) capsule Take 2,000 Units by mouth daily.     citalopram (CELEXA) 20 MG tablet Take 1 tablet (20 mg total) by mouth daily. 30 tablet 0   Cyanocobalamin (VITAMIN B-12 PO) Take 2,500-5,000 mcg by mouth every other day. 2500 mcg     diclofenac Sodium (VOLTAREN) 1 % GEL Apply 2 g topically daily as needed (Pain). 2 mg; topical Special Instructions: apply to bilateral knees for pain  management     docusate sodium (COLACE) 100 MG capsule Take 1 capsule (100 mg total) by mouth 2 (two) times daily. 10 capsule 0   Flaxseed, Linseed, (FLAX SEED OIL) 1000 MG CAPS Take 1,000 mg by mouth daily.     flecainide (TAMBOCOR) 100 MG tablet Take 1 tablet (100 mg total) by mouth 2 (two) times daily. 60 tablet 0   furosemide (LASIX) 80 MG tablet Take 2 tablets (160 mg total) by mouth daily. (Patient taking differently: Take 80-160 mg by mouth See admin instructions. Take 160 mg in the morning and 80 mg at noon) 60 tablet 0   gabapentin (NEURONTIN) 100 MG capsule Take 1 capsule (100 mg total) by mouth 3 (three) times daily. 90 capsule 0   glipiZIDE (GLUCOTROL XL) 10 MG 24 hr tablet Take 10 mg by mouth daily with breakfast.     HYDROcodone-acetaminophen (NORCO) 7.5-325 MG tablet Take 1 tablet by mouth 3 (three) times daily as needed for severe pain or moderate pain (arthritis).     levothyroxine (SYNTHROID) 75 MCG tablet Take 1 tablet (75 mcg total) by mouth daily before breakfast. 30 tablet 0   warfarin (COUMADIN) 6 MG tablet Take 1 tablet (6 mg total) by mouth at bedtime. (Patient taking differently: Take 2.5-5 mg by mouth See admin instructions. Monday Wednesday Friday and Saturday 5mg  daily Tuesday Thursday Sunday 2.5 mg) 30  tablet 0   No current facility-administered medications for this visit.     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 Juanda Chance: per e-chart report: diverticulosis, path adenomatous polyps   S/P endoscopy 07/2007   multiple 3-4 mmsessile gastric polyps, benign, singl antral erosions,  negative H.pylori, reactive gastropathy ? 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   Past Surgical History:  Procedure Laterality Date   CATARACT EXTRACTION W/PHACO Left 04/07/2021   Procedure: CATARACT EXTRACTION PHACO AND INTRAOCULAR LENS PLACEMENT LEFT EYE;  Surgeon: Fabio Pierce, MD;  Location: [facility];  Service: Ophthalmology;  Laterality: Left;  left CDE=11.11   CATARACT EXTRACTION W/PHACO Right 04/21/2021   Procedure: CATARACT EXTRACTION PHACO AND INTRAOCULAR LENS PLACEMENT RIGHT EYE;  Surgeon: Fabio Pierce, MD;  Location: [facility] OR[facility];  Service: Ophthalmology;  Laterality: Right;  right CDE=10.12   CHOLECYSTECTOMY     COLONOSCOPY  03/10/2011   Procedure: COLONOSCOPY;  Surgeon: Corbin Ade, MD;  Location: [facility] END[facility];  Service: Endoscopy;  Laterality: N/A;  12:45   COLONOSCOPY WITH PROPOFOL N/A 07/22/2020   Procedure: COLONOSCOPY WITH PROPOFOL;  Surgeon: Corbin Ade, MD;  Location: [facility];  Service: Endoscopy;  Laterality: N/A;  10:30am   HIP PINNING,CANNULATED Right 09/20/2021   Procedure: CANNULATED HIP PINNING;  Surgeon: Vickki Hearing, MD;  Location: [facility];  Service: Orthopedics;  Laterality: Right;   PACEMAKER INSERTION  02/05/2012   SJM Accent DR RF implanted by Dr Johney Frame for symptomatic bradycardia   PERMANENT PACEMAKER INSERTION N/A 02/05/2012   Procedure: PERMANENT PACEMAKER INSERTION;  Surgeon: Hillis Range, MD;  Location: [facility] CATH LAB;  Service: Cardiovascular;  Laterality: N/A;   POLYPECTOMY  07/22/2020   Procedure: POLYPECTOMY;  Surgeon: Corbin Ade, MD;  Location: [facility];  Service: Endoscopy;;   PPM GENERATOR CHANGEOUT N/A 07/06/2022   Procedure: PPM GENERATOR CHANGEOUT;  Surgeon: Marinus Maw, MD;  Location: [facility] INVASIVE CV LAB;  Service: Cardiovascular;  Laterality: N/A;   SHOULDER SURGERY  2008   left shoulder replacement due  to fracture    SKIN CANCER EXCISION Left    Skin cancer on left side of face.     Family History  Problem Relation Age of Onset   Heart disease Other    Arthritis Other    Lung disease Other    Diabetes Other    Kidney disease Other    Cancer Mother    Heart disease Father    Colon cancer Neg Hx      Social History   Socioeconomic History   Marital status: Legally Separated    Spouse name: Not on file   Number of children: Not on file   Years of education: Not on file   Highest education level: Not on file  Occupational History   Not on file  Tobacco Use  Smoking status: Never   Smokeless tobacco: Never  Vaping Use   Vaping Use: Never used  Substance and Sexual Activity   Alcohol use: No   Drug use: No   Sexual activity: Not Currently  Other Topics Concern   Not on file  Social History Narrative   Not on file   Social Determinants of Health   Financial Resource Strain: Not on file  Food Insecurity: Not on file  Transportation Needs: Not on file  Physical Activity: Not on file  Stress: Not on file  Social Connections: Not on file  Intimate Partner Violence: Not on file     BP (!) 140/64   Pulse 69   Ht 5\' 5"  (1.651 m)   Wt 154 lb (69.9 kg)   SpO2 93%   BMI 25.63 kg/m   Physical Exam:  Well appearing NAD HEENT: Unremarkable Neck:  No JVD, no thyromegally Lymphatics:  No adenopathy Back:  No CVA tenderness Lungs:  Clear with no wheezes HEART:  Regular rate rhythm, no murmurs, no rubs, no clicks Abd:  soft, positive bowel sounds, no organomegally, no rebound, no guarding Ext:  2 plus pulses, 2+ edema, no cyanosis, no clubbing Skin:  No rashes no nodules Neuro:  CN II through XII intact, motor grossly intact   DEVICE  Normal device function.  See PaceArt for details.   Assess/Plan: Sinus node dysfunction - she is asymptomatic s/p PPM insertion. PAF - she will continue flecainide and coumadin Venous insufficiency - she is encouraged to  avoid salty foods and keep her legs elevated. HTN - her bp is fairly well controlled. No change in meds.  Sharlot Gowda Ahnesty Finfrock,MD

## 2022-10-14 NOTE — Patient Instructions (Signed)
Medication Instructions:  Your physician recommends that you continue on your current medications as directed. Please refer to the Current Medication list given to you today.  *If you need a refill on your cardiac medications before your next appointment, please call your pharmacy*   Lab Work: NONE   If you have labs (blood work) drawn today and your tests are completely normal, you will receive your results only by: MyChart Message (if you have MyChart) OR A paper copy in the mail If you have any lab test that is abnormal or we need to change your treatment, we will call you to review the results.   Testing/Procedures: NONE    Follow-Up: At [facility], you and your health needs are our priority.  As part of our continuing mission to provide you with exceptional heart care, we have created designated Provider Care Teams.  These Care Teams include your primary Cardiologist (physician) and Advanced Practice Providers (APPs -  Physician Assistants and Nurse Practitioners) who all work together to provide you with the care you need, when you need it.  We recommend signing up for the patient portal called "MyChart".  Sign up information is provided on this After Visit Summary.  MyChart is used to connect with patients for Virtual Visits (Telemedicine).  Patients are able to view lab/test results, encounter notes, upcoming appointments, etc.  Non-urgent messages can be sent to your provider as well.   To learn more about what you can do with MyChart, go to [email or web address].    Your next appointment:   1 year(s)  Provider:   Gregg Taylor, MD    Other Instructions Thank you for choosing [facility]!    

## 2022-10-22 DIAGNOSIS — I4821 Permanent atrial fibrillation: Secondary | ICD-10-CM | POA: Diagnosis not present

## 2022-10-22 NOTE — Progress Notes (Signed)
Remote pacemaker transmission.   

## 2022-11-19 DIAGNOSIS — I4821 Permanent atrial fibrillation: Secondary | ICD-10-CM | POA: Diagnosis not present

## 2022-12-02 DIAGNOSIS — D0471 Carcinoma in situ of skin of right lower limb, including hip: Secondary | ICD-10-CM | POA: Diagnosis not present

## 2022-12-02 DIAGNOSIS — L658 Other specified nonscarring hair loss: Secondary | ICD-10-CM | POA: Diagnosis not present

## 2022-12-07 ENCOUNTER — Ambulatory Visit: Payer: Medicare HMO | Admitting: Orthopedic Surgery

## 2022-12-07 VITALS — BP 135/71 | HR 80 | Ht 65.0 in | Wt 154.0 lb

## 2022-12-07 DIAGNOSIS — M25511 Pain in right shoulder: Secondary | ICD-10-CM

## 2022-12-07 DIAGNOSIS — G8929 Other chronic pain: Secondary | ICD-10-CM | POA: Diagnosis not present

## 2022-12-07 MED ORDER — METHYLPREDNISOLONE ACETATE 40 MG/ML IJ SUSP
40.0000 mg | Freq: Once | INTRAMUSCULAR | Status: AC
  Administered 2022-12-07: 40 mg via INTRA_ARTICULAR

## 2022-12-07 NOTE — Progress Notes (Signed)
Chief Complaint  Patient presents with   Shoulder Pain    Right / wants injection    This is an 82 year old female who has chronic pain in her right.  She has strength referred to do exercises at home and would like another injection in the right shoulder    The patient has requested an injection   Chief Complaint  Patient presents with   Shoulder Pain    Right / wants injection      Encounter Diagnosis  Name Primary?   Chronic right shoulder pain Yes        After appropriate timeout for site confirmation medication confirmation,  The RIGHT SHOULDER  was prepped with alcohol and ethyl chloride spray.  The injection was performed at the [facility]   Medication Depomedrol 40 mg and 1% lidocaine plain   There were no complications  The patient was observed for any reactions there were none and the patient was discharged.

## 2022-12-17 DIAGNOSIS — I4821 Permanent atrial fibrillation: Secondary | ICD-10-CM | POA: Diagnosis not present

## 2023-01-06 ENCOUNTER — Ambulatory Visit (INDEPENDENT_AMBULATORY_CARE_PROVIDER_SITE_OTHER): Payer: Medicare HMO

## 2023-01-06 DIAGNOSIS — I495 Sick sinus syndrome: Secondary | ICD-10-CM | POA: Diagnosis not present

## 2023-01-06 LAB — CUP PACEART REMOTE DEVICE CHECK
Battery Remaining Longevity: 107 mo
Battery Remaining Percentage: 95.5 %
Battery Voltage: 2.99 V
Brady Statistic AP VP Percent: 7.8 %
Brady Statistic AP VS Percent: 82 %
Brady Statistic AS VP Percent: 1 %
Brady Statistic AS VS Percent: 9.3 %
Brady Statistic RA Percent Paced: 89 %
Brady Statistic RV Percent Paced: 8.8 %
Date Time Interrogation Session: 20240814054221
Implantable Lead Connection Status: 753985
Implantable Lead Connection Status: 753985
Implantable Lead Implant Date: 20130913
Implantable Lead Implant Date: 20130913
Implantable Lead Location: 753859
Implantable Lead Location: 753860
Implantable Lead Model: 1948
Implantable Pulse Generator Implant Date: 20240212
Lead Channel Impedance Value: 360 Ohm
Lead Channel Impedance Value: 560 Ohm
Lead Channel Pacing Threshold Amplitude: 0.75 V
Lead Channel Pacing Threshold Amplitude: 1.25 V
Lead Channel Pacing Threshold Pulse Width: 0.5 ms
Lead Channel Pacing Threshold Pulse Width: 0.5 ms
Lead Channel Sensing Intrinsic Amplitude: 1.3 mV
Lead Channel Sensing Intrinsic Amplitude: 12 mV
Lead Channel Setting Pacing Amplitude: 2 V
Lead Channel Setting Pacing Amplitude: 2.5 V
Lead Channel Setting Pacing Pulse Width: 0.5 ms
Lead Channel Setting Sensing Sensitivity: 2 mV
Pulse Gen Model: 2272
Pulse Gen Serial Number: 8153935

## 2023-01-14 DIAGNOSIS — I4821 Permanent atrial fibrillation: Secondary | ICD-10-CM | POA: Diagnosis not present

## 2023-01-20 NOTE — Progress Notes (Signed)
Remote pacemaker transmission.   

## 2023-01-26 ENCOUNTER — Emergency Department (HOSPITAL_COMMUNITY)
Admission: EM | Admit: 2023-01-26 | Discharge: 2023-01-26 | Disposition: A | Discharge status: Home / Self Care | Payer: Medicare HMO | Source: Home / Self Care | Attending: Emergency Medicine | Admitting: Emergency Medicine

## 2023-01-26 ENCOUNTER — Emergency Department (HOSPITAL_COMMUNITY): Payer: Medicare HMO

## 2023-01-26 ENCOUNTER — Encounter (HOSPITAL_COMMUNITY): Payer: Self-pay | Admitting: *Deleted

## 2023-01-26 ENCOUNTER — Other Ambulatory Visit: Payer: Self-pay

## 2023-01-26 DIAGNOSIS — S0990XA Unspecified injury of head, initial encounter: Secondary | ICD-10-CM | POA: Diagnosis not present

## 2023-01-26 DIAGNOSIS — S42211A Unspecified displaced fracture of surgical neck of right humerus, initial encounter for closed fracture: Secondary | ICD-10-CM | POA: Insufficient documentation

## 2023-01-26 DIAGNOSIS — Z8673 Personal history of transient ischemic attack (TIA), and cerebral infarction without residual deficits: Secondary | ICD-10-CM | POA: Insufficient documentation

## 2023-01-26 DIAGNOSIS — Z7901 Long term (current) use of anticoagulants: Secondary | ICD-10-CM | POA: Insufficient documentation

## 2023-01-26 DIAGNOSIS — I6789 Other cerebrovascular disease: Secondary | ICD-10-CM | POA: Insufficient documentation

## 2023-01-26 DIAGNOSIS — M25511 Pain in right shoulder: Secondary | ICD-10-CM | POA: Diagnosis not present

## 2023-01-26 DIAGNOSIS — W010XXA Fall on same level from slipping, tripping and stumbling without subsequent striking against object, initial encounter: Secondary | ICD-10-CM | POA: Diagnosis not present

## 2023-01-26 DIAGNOSIS — S42291A Other displaced fracture of upper end of right humerus, initial encounter for closed fracture: Secondary | ICD-10-CM | POA: Diagnosis not present

## 2023-01-26 LAB — COMPREHENSIVE METABOLIC PANEL
ALT: 17 U/L (ref 0–44)
AST: 27 U/L (ref 15–41)
Albumin: 4.3 g/dL (ref 3.5–5.0)
Alkaline Phosphatase: 77 U/L (ref 38–126)
Anion gap: 12 (ref 5–15)
BUN: 37 mg/dL — ABNORMAL HIGH (ref 8–23)
CO2: 30 mmol/L (ref 22–32)
Calcium: 9.2 mg/dL (ref 8.9–10.3)
Chloride: 97 mmol/L — ABNORMAL LOW (ref 98–111)
Creatinine, Ser: 1.36 mg/dL — ABNORMAL HIGH (ref 0.44–1.00)
GFR, Estimated: 39 mL/min — ABNORMAL LOW (ref >60)
Glucose, Bld: 117 mg/dL — ABNORMAL HIGH (ref 70–99)
Potassium: 3.7 mmol/L (ref 3.5–5.1)
Sodium: 139 mmol/L (ref 135–145)
Total Bilirubin: 0.5 mg/dL (ref 0.3–1.2)
Total Protein: 7.2 g/dL (ref 6.5–8.1)

## 2023-01-26 LAB — CBC WITH DIFFERENTIAL/PLATELET
Abs Immature Granulocytes: 0.05 10*3/uL (ref 0.00–0.07)
Basophils Absolute: 0 10*3/uL (ref 0.0–0.1)
Basophils Relative: 0 %
Eosinophils Absolute: 0.3 10*3/uL (ref 0.0–0.5)
Eosinophils Relative: 3 %
HCT: 39.6 % (ref 36.0–46.0)
Hemoglobin: 12.8 g/dL (ref 12.0–15.0)
Immature Granulocytes: 1 %
Lymphocytes Relative: 32 %
Lymphs Abs: 3.2 10*3/uL (ref 0.7–4.0)
MCH: 33 pg (ref 26.0–34.0)
MCHC: 32.3 g/dL (ref 30.0–36.0)
MCV: 102.1 fL — ABNORMAL HIGH (ref 80.0–100.0)
Monocytes Absolute: 0.5 10*3/uL (ref 0.1–1.0)
Monocytes Relative: 5 %
Neutro Abs: 5.7 10*3/uL (ref 1.7–7.7)
Neutrophils Relative %: 59 %
Platelets: 148 10*3/uL — ABNORMAL LOW (ref 150–400)
RBC: 3.88 MIL/uL (ref 3.87–5.11)
RDW: 14.4 % (ref 11.5–15.5)
WBC: 9.7 10*3/uL (ref 4.0–10.5)
nRBC: 0 % (ref 0.0–0.2)

## 2023-01-26 LAB — PROTIME-INR
INR: 2.3 — ABNORMAL HIGH (ref 0.8–1.2)
Prothrombin Time: 25.6 s — ABNORMAL HIGH (ref 11.4–15.2)

## 2023-01-26 MED ORDER — ACETAMINOPHEN 500 MG PO TABS
1000.0000 mg | ORAL_TABLET | Freq: Once | ORAL | Status: AC
  Administered 2023-01-26: 1000 mg via ORAL
  Filled 2023-01-26: qty 2

## 2023-01-26 MED ORDER — OXYCODONE-ACETAMINOPHEN 5-325 MG PO TABS
1.0000 | ORAL_TABLET | Freq: Four times a day (QID) | ORAL | 0 refills | Status: DC | PRN
Start: 2023-01-26 — End: 2023-02-03

## 2023-01-26 MED ORDER — ONDANSETRON HCL 4 MG PO TABS
4.0000 mg | ORAL_TABLET | Freq: Three times a day (TID) | ORAL | 0 refills | Status: DC | PRN
Start: 2023-01-26 — End: 2023-07-23

## 2023-01-26 MED ORDER — MORPHINE SULFATE (PF) 4 MG/ML IV SOLN
4.0000 mg | Freq: Once | INTRAVENOUS | Status: AC
  Administered 2023-01-26: 4 mg via INTRAVENOUS
  Filled 2023-01-26: qty 1

## 2023-01-26 MED ORDER — MORPHINE SULFATE (PF) 4 MG/ML IV SOLN
2.0000 mg | Freq: Once | INTRAVENOUS | Status: AC
  Administered 2023-01-26: 2 mg via INTRAVENOUS
  Filled 2023-01-26: qty 1

## 2023-01-26 NOTE — Discharge Instructions (Signed)
You can take 500 mg of Tylenol every 8 hours.  You can take 40 mg of ibuprofen every 6 hours.  You can take 1 Percocet every 6 hours for severe pain.  You take Zofran every 8 hours for nausea.  Call your orthopedic surgeon tomorrow to set up an appointment for a humerus fracture.

## 2023-01-26 NOTE — ED Notes (Signed)
Pt ambulated to the bathroom and back to bed with one man assist. Pt back in bed. Will continue care.

## 2023-01-26 NOTE — ED Triage Notes (Signed)
Pt tripped over a step and fell, states she hit side of face on right side.  Pt with right shoulder pain and limited ROM, pt believes she may have dislocated her shoulder. Pt is on blood thinner warfarin.

## 2023-01-26 NOTE — ED Provider Notes (Signed)
[place] EMERGENCY DEPARTMENT AT [facility] Provider Note   CSN: [id number] Arrival date & time: 01/26/23  1707     History  Chief Complaint  Patient presents with   [name] is a 82 y.o. female.  This is an 82 year old female who presents to the emergency department after she had a fall while walking her dog.  Patient lost her balance and landed on her right shoulder.  She does take Coumadin.  Denies head strike or loss of consciousness.   Fall       Home Medications Prior to Admission medications   Medication Sig Start Date End Date Taking? Authorizing Provider  ondansetron (ZOFRAN) 4 MG tablet Take 1 tablet (4 mg total) by mouth every 8 (eight) hours as needed for up to 4 doses for nausea or vomiting. 01/26/23  Yes Anders Simmonds T, DO  oxyCODONE-acetaminophen (PERCOCET/ROXICET) 5-325 MG tablet Take 1 tablet by mouth every 6 (six) hours as needed for severe pain. 01/26/23  Yes Anders Simmonds T, DO  oxyCODONE-acetaminophen (PERCOCET/ROXICET) 5-325 MG tablet Take 1 tablet by mouth every 6 (six) hours as needed for severe pain. 01/26/23  Yes Arletha Pili, DO  acetaminophen (TYLENOL) 650 MG CR tablet Take 1,300 mg by mouth every 8 (eight) hours as needed for pain.    [provider]  albuterol (VENTOLIN HFA) 108 (90 Base) MCG/ACT inhaler Inhale 2 puffs into the lungs every 6 (six) hours as needed for wheezing or shortness of breath. 09/29/21   Sharee Holster, NP  allopurinol (ZYLOPRIM) 100 MG tablet Take 2 tablets (200 mg total) by mouth daily. 09/29/21   Sharee Holster, NP  atorvastatin (LIPITOR) 20 MG tablet Take 1 tablet (20 mg total) by mouth daily. Patient taking differently: Take 20 mg by mouth at bedtime. 09/29/21   Sharee Holster, NP  calcitRIOL (ROCALTROL) 0.5 MCG capsule Take 1 capsule (0.5 mcg total) by mouth daily. 0.25 mcg; amt: 1 tab; oral Once A Day Every Other Day  0.5 mcg; oral,Once A Day Every Other Day Patient taking  differently: Take 0.25-0.5 mcg by mouth See admin instructions. 0.25 mcg; amt: 1 tab; oral Once A Day Every Other Day  0.5 mcg; oral,Once A Day Every Other Day 09/29/21   Sharee Holster, NP  Cholecalciferol (VITAMIN D3) 50 MCG (2000 UT) capsule Take 2,000 Units by mouth daily.    [provider]  citalopram (CELEXA) 20 MG tablet Take 1 tablet (20 mg total) by mouth daily. 09/29/21   Sharee Holster, NP  Cyanocobalamin (VITAMIN B-12 PO) Take 2,500-5,000 mcg by mouth every other day. 2500 mcg    [provider]  diclofenac Sodium (VOLTAREN) 1 % GEL Apply 2 g topically daily as needed (Pain). 2 mg; topical Special Instructions: apply to bilateral knees for pain management    [provider]  docusate sodium (COLACE) 100 MG capsule Take 1 capsule (100 mg total) by mouth 2 (two) times daily. 09/22/21   Shahmehdi, Seyed A, MD  Flaxseed, Linseed, (FLAX SEED OIL) 1000 MG CAPS Take 1,000 mg by mouth daily.    [provider]  flecainide (TAMBOCOR) 100 MG tablet Take 1 tablet (100 mg total) by mouth 2 (two) times daily. 09/29/21   Sharee Holster, NP  furosemide (LASIX) 80 MG tablet Take 2 tablets (160 mg total) by mouth daily. Patient taking differently: Take 80-160 mg by mouth See admin instructions. Take 160 mg in the morning and  80 mg at noon 09/29/21   Sharee Holster, NP  gabapentin (NEURONTIN) 100 MG capsule Take 1 capsule (100 mg total) by mouth 3 (three) times daily. 09/29/21   Sharee Holster, NP  glipiZIDE (GLUCOTROL XL) 10 MG 24 hr tablet Take 10 mg by mouth daily with breakfast. 06/22/22   [provider]  HYDROcodone-acetaminophen (NORCO) 7.5-325 MG tablet Take 1 tablet by mouth 3 (three) times daily as needed for severe pain or moderate pain (arthritis). 10/27/21   [provider]  levothyroxine (SYNTHROID) 75 MCG tablet Take 1 tablet (75 mcg total) by mouth daily before breakfast. 09/29/21   Chilton Si, Chong Sicilian, NP  potassium chloride SA (KLOR-CON M) 20  MEQ tablet Take 20 mEq by mouth 2 (two) times a week.    [provider]  warfarin (COUMADIN) 6 MG tablet Take 1 tablet (6 mg total) by mouth at bedtime. Patient taking differently: Take 2.5-5 mg by mouth See admin instructions. Monday Wednesday Friday and Saturday 5mg  daily Tuesday Thursday Sunday 2.5 mg 09/29/21   Sharee Holster, NP      Allergies    Fiorinal [butalbital-aspirin-caffeine], Penicillins, and Tetanus toxoids    Review of Systems   Review of Systems  Physical Exam Updated Vital Signs BP (!) 190/65   Pulse 63   Temp 98 F (36.7 C) (Temporal)   Resp 20   Ht 5\' 5"  (1.651 m)   Wt 65.8 kg   SpO2 100%   BMI 24.13 kg/m  Physical Exam HENT:     Head: Normocephalic and atraumatic.     Mouth/Throat:     Mouth: Mucous membranes are moist.  Eyes:     Pupils: Pupils are equal, round, and reactive to light.  Cardiovascular:     Rate and Rhythm: Normal rate.  Pulmonary:     Effort: Pulmonary effort is normal.     Breath sounds: Normal breath sounds.  Abdominal:     General: Abdomen is flat.     Palpations: Abdomen is soft.  Musculoskeletal:     Cervical back: Normal range of motion. No rigidity or tenderness.     Comments: Point tenderness over the right upper humerus.  No deformity of the clavicle.  No tenderness to palpation in the right elbow, forearm wrist or hand.  No tenderness in the bilateral hips.  No lower extremity injury identified on physical exam.  Neurological:     Mental Status: She is alert.     Comments: Normal gait.  Right hand-intact median, radial, ulnar nerve sensation and function in the right arm and hand.     ED Results / Procedures / Treatments   Labs (all labs ordered are listed, but only abnormal results are displayed) Labs Reviewed  CBC WITH DIFFERENTIAL/PLATELET - Abnormal; Notable for the following components:      Result Value   MCV 102.1 (*)    Platelets 148 (*)    All other components within normal limits   COMPREHENSIVE METABOLIC PANEL - Abnormal; Notable for the following components:   Chloride 97 (*)    Glucose, Bld 117 (*)    BUN 37 (*)    Creatinine, Ser 1.36 (*)    GFR, Estimated 39 (*)    All other components within normal limits  PROTIME-INR - Abnormal; Notable for the following components:   Prothrombin Time 25.6 (*)    INR 2.3 (*)    All other components within normal limits    EKG None  Radiology CT Head  Wo Contrast  Result Date: 01/26/2023 CLINICAL DATA:  Fall, hit right side of face/head EXAM: CT HEAD WITHOUT CONTRAST TECHNIQUE: Contiguous axial images were obtained from the base of the skull through the vertex without intravenous contrast. RADIATION DOSE REDUCTION: This exam was performed according to the departmental dose-optimization program which includes automated exposure control, adjustment of the mA and/or kV according to patient size and/or use of iterative reconstruction technique. COMPARISON:  09/19/2021 FINDINGS: Brain: Old right MCA infarct. There is atrophy and chronic small vessel disease changes. No acute intracranial abnormality. Specifically, no hemorrhage, hydrocephalus, mass lesion, acute infarction, or significant intracranial injury. Vascular: No hyperdense vessel or unexpected calcification. Skull: No acute calvarial abnormality. Sinuses/Orbits: No acute findings Other: None IMPRESSION: Old right MCA territory infarct, unchanged. Atrophy, chronic microvascular disease. No acute intracranial abnormality. Electronically Signed   By: Charlett Nose M.D.   On: 01/26/2023 20:39   DG Humerus Right  Result Date: 01/26/2023 CLINICAL DATA:  Right shoulder pain.  Fell. EXAM: RIGHT HUMERUS - 2+ VIEW; RIGHT SHOULDER - 2+ VIEW COMPARISON:  Basilar radiographs 08/17/2022 FINDINGS: Right shoulder: There is diffuse decreased bone mineralization. There is a transverse fracture of the proximal right humerus surgical neck with approximately 4 mm medial displacement of the distal  fracture component with respect to the proximal fracture component. There is a displaced fracture of the greater tuberosity with a least 1 cm lateral displacement. The humeral head is normally located with respect to the glenoid fossa. Mild-to-moderate acromioclavicular joint space narrowing and peripheral osteophytosis. Partial visualization of AICD wires overlying the right atrium and right ventricle. Right humerus: No additional acute fracture is seen within the more distal aspect of the right humerus. Overlap of bones at the elbow limited evaluation of the proximal radius and ulna. Cholecystectomy clips. IMPRESSION: 1. Mildly displaced transverse fracture of the proximal right humerus surgical neck. 2. Acute displaced fracture of the greater tuberosity. Electronically Signed   By: Neita Garnet M.D.   On: 01/26/2023 19:34   DG Shoulder Right  Result Date: 01/26/2023 CLINICAL DATA:  Right shoulder pain.  Fell. EXAM: RIGHT HUMERUS - 2+ VIEW; RIGHT SHOULDER - 2+ VIEW COMPARISON:  Basilar radiographs 08/17/2022 FINDINGS: Right shoulder: There is diffuse decreased bone mineralization. There is a transverse fracture of the proximal right humerus surgical neck with approximately 4 mm medial displacement of the distal fracture component with respect to the proximal fracture component. There is a displaced fracture of the greater tuberosity with a least 1 cm lateral displacement. The humeral head is normally located with respect to the glenoid fossa. Mild-to-moderate acromioclavicular joint space narrowing and peripheral osteophytosis. Partial visualization of AICD wires overlying the right atrium and right ventricle. Right humerus: No additional acute fracture is seen within the more distal aspect of the right humerus. Overlap of bones at the elbow limited evaluation of the proximal radius and ulna. Cholecystectomy clips. IMPRESSION: 1. Mildly displaced transverse fracture of the proximal right humerus surgical neck.  2. Acute displaced fracture of the greater tuberosity. Electronically Signed   By: Neita Garnet M.D.   On: 01/26/2023 19:34    Procedures Procedures    Medications Ordered in ED Medications  morphine (PF) 4 MG/ML injection 2 mg (2 mg Intravenous Given 01/26/23 1817)  acetaminophen (TYLENOL) tablet 1,000 mg (1,000 mg Oral Given 01/26/23 1816)  morphine (PF) 4 MG/ML injection 4 mg (4 mg Intravenous Given 01/26/23 2043)    ED Course/ Medical Decision Making/ A&P  Medical Decision Making 82 year old female here today after a nonsyncopal fall from standing.  Differential diagnoses include right humerus fracture, right shoulder fracture, intracranial hemorrhage.  Plan-patient appears to have a right humerus fracture.  Will obtain plain films of the arm and shoulder.  With the patient being on Eliquis, will obtain CT imaging of the head.  No neurological deficits on my exam.  Overall patient looks well.  Reassessment-independent review of the patient's head CT shows no intracranial hemorrhage.  Patient with a humeral neck fracture.  Have placed patient in a sling.  Patient has an orthopedic doctor that she sees regularly for joint injections in that same now injured shoulder.  Will discharge patient with analgesia.  Her sister is with her who lives 2 doors down from her.  Patient has no concerns about her ability to take care of herself at home.  Amount and/or Complexity of Data Reviewed Labs: ordered. Radiology: ordered.  Risk OTC drugs. Prescription drug management.           Final Clinical Impression(s) / ED Diagnoses Final diagnoses:  Closed displaced fracture of surgical neck of right humerus, unspecified fracture morphology, initial encounter    Rx / DC Orders ED Discharge Orders          Ordered    oxyCODONE-acetaminophen (PERCOCET/ROXICET) 5-325 MG tablet  Every 6 hours PRN        01/26/23 2059    ondansetron (ZOFRAN) 4 MG tablet  Every 8  hours PRN        01/26/23 2059    oxyCODONE-acetaminophen (PERCOCET/ROXICET) 5-325 MG tablet  Every 6 hours PRN        01/26/23 2101              Anders Simmonds T, DO 01/26/23 2102

## 2023-02-03 ENCOUNTER — Ambulatory Visit: Payer: Medicare HMO | Admitting: Orthopedic Surgery

## 2023-02-03 ENCOUNTER — Encounter: Payer: Self-pay | Admitting: Orthopedic Surgery

## 2023-02-03 VITALS — BP 136/75 | HR 66 | Ht 65.0 in

## 2023-02-03 DIAGNOSIS — M62838 Other muscle spasm: Secondary | ICD-10-CM

## 2023-02-03 DIAGNOSIS — S42291A Other displaced fracture of upper end of right humerus, initial encounter for closed fracture: Secondary | ICD-10-CM

## 2023-02-03 MED ORDER — HYDROCODONE-ACETAMINOPHEN 7.5-325 MG PO TABS: 1.0000 | ORAL_TABLET | ORAL | 0 refills | Status: AC | PRN

## 2023-02-03 MED ORDER — TIZANIDINE HCL 4 MG PO TABS
4.0000 mg | ORAL_TABLET | Freq: Four times a day (QID) | ORAL | 0 refills | Status: DC | PRN
Start: 2023-02-03 — End: 2023-07-23

## 2023-02-03 NOTE — Progress Notes (Signed)
Chief Complaint  Patient presents with   Shoulder Pain    Right shoulder pain/ fracture ER follow up 01/26/23   82 year old female with atrial fibrillation [employer] pacemaker type 2 diabetes hypertension hypothyroidism secondary hyperparathyroidism CKD stage III on warfarin presents with a proximal humerus fracture right shoulder after mechanical fall  Complains of pain right shoulder relieved by hydrocodone  The patient fell at home presented to the ER for evaluation she has a three-part proximal humerus fracture  Review of systems she also complains of some chest pain across the breast line not typical of her cardiac chest pain and also has muscle spasms in the left scapular region  BP 136/75   Pulse 66   Ht 5\' 5"  (1.651 m)   BMI 24.13 kg/m    Problem list, medical hx, medications and allergies reviewed   Examination reveals a bruising and ecchymosis of the right arm tenderness in the proximal shoulder with no deformity good grip strength distally normal sensation throughout the right upper extremity good pulse and perfusion  Outside imaging was reviewed  She has a proximal humerus fracture three-part fracture involves part of the greater tuberosity  There is slight typical angulation and this fracture fragment pattern  Based on her history and the configuration of the fracture recommend nonoperative treatment  X-rays weekly for 3 weeks until the fracture stabilizes on x-ray  Meds ordered this encounter  Medications   HYDROcodone-acetaminophen (NORCO) 7.5-325 MG tablet    Sig: Take 1 tablet by mouth every 4 (four) hours as needed for up to 5 days for severe pain or moderate pain (arthritis).    Dispense:  30 tablet    Refill:  0   tiZANidine (ZANAFLEX) 4 MG tablet    Sig: Take 1 tablet (4 mg total) by mouth every 6 (six) hours as needed for muscle spasms.    Dispense:  30 tablet    Refill:  0

## 2023-02-04 ENCOUNTER — Encounter (HOSPITAL_COMMUNITY): Payer: Self-pay

## 2023-02-04 ENCOUNTER — Emergency Department (HOSPITAL_COMMUNITY)
Admission: EM | Admit: 2023-02-04 | Discharge: 2023-02-04 | Disposition: A | Discharge status: Home / Self Care | Payer: Medicare HMO | Attending: Emergency Medicine | Admitting: Emergency Medicine

## 2023-02-04 ENCOUNTER — Other Ambulatory Visit: Payer: Self-pay

## 2023-02-04 ENCOUNTER — Emergency Department (HOSPITAL_COMMUNITY): Payer: Medicare HMO

## 2023-02-04 DIAGNOSIS — E1122 Type 2 diabetes mellitus with diabetic chronic kidney disease: Secondary | ICD-10-CM | POA: Diagnosis not present

## 2023-02-04 DIAGNOSIS — R531 Weakness: Secondary | ICD-10-CM | POA: Diagnosis not present

## 2023-02-04 DIAGNOSIS — R400 Somnolence: Secondary | ICD-10-CM | POA: Diagnosis not present

## 2023-02-04 DIAGNOSIS — I129 Hypertensive chronic kidney disease with stage 1 through stage 4 chronic kidney disease, or unspecified chronic kidney disease: Secondary | ICD-10-CM | POA: Insufficient documentation

## 2023-02-04 DIAGNOSIS — N189 Chronic kidney disease, unspecified: Secondary | ICD-10-CM | POA: Insufficient documentation

## 2023-02-04 DIAGNOSIS — E039 Hypothyroidism, unspecified: Secondary | ICD-10-CM | POA: Insufficient documentation

## 2023-02-04 DIAGNOSIS — Z8673 Personal history of transient ischemic attack (TIA), and cerebral infarction without residual deficits: Secondary | ICD-10-CM | POA: Insufficient documentation

## 2023-02-04 DIAGNOSIS — Z79890 Hormone replacement therapy (postmenopausal): Secondary | ICD-10-CM | POA: Diagnosis not present

## 2023-02-04 DIAGNOSIS — S42291D Other displaced fracture of upper end of right humerus, subsequent encounter for fracture with routine healing: Secondary | ICD-10-CM | POA: Diagnosis not present

## 2023-02-04 DIAGNOSIS — R42 Dizziness and giddiness: Secondary | ICD-10-CM | POA: Insufficient documentation

## 2023-02-04 DIAGNOSIS — Z743 Need for continuous supervision: Secondary | ICD-10-CM | POA: Diagnosis not present

## 2023-02-04 DIAGNOSIS — W19XXXA Unspecified fall, initial encounter: Secondary | ICD-10-CM

## 2023-02-04 DIAGNOSIS — Z7901 Long term (current) use of anticoagulants: Secondary | ICD-10-CM | POA: Insufficient documentation

## 2023-02-04 DIAGNOSIS — M19011 Primary osteoarthritis, right shoulder: Secondary | ICD-10-CM | POA: Diagnosis not present

## 2023-02-04 DIAGNOSIS — I959 Hypotension, unspecified: Secondary | ICD-10-CM | POA: Diagnosis not present

## 2023-02-04 DIAGNOSIS — M25511 Pain in right shoulder: Secondary | ICD-10-CM | POA: Diagnosis not present

## 2023-02-04 DIAGNOSIS — Z7984 Long term (current) use of oral hypoglycemic drugs: Secondary | ICD-10-CM | POA: Diagnosis not present

## 2023-02-04 DIAGNOSIS — Z79899 Other long term (current) drug therapy: Secondary | ICD-10-CM | POA: Diagnosis not present

## 2023-02-04 NOTE — Discharge Instructions (Signed)
As discussed, x-ray appears very similar to prior x-ray of the right shoulder.  Recommend follow-up with orthopedics in the outpatient setting.  Recommend stop using the muscle relaxer as you seem to have had adverse effects to it.  Please do not hesitate to return to emergency department for worrisome signs and symptoms we discussed become apparent.

## 2023-02-04 NOTE — ED Triage Notes (Signed)
Pt brought in by [facility] for fall. Pt has previously fractured her rt shoulder from a previous fall and fell onto her should today. Pt wants an xray to make sure she has not fractured her shoulder worse.

## 2023-02-04 NOTE — ED Provider Notes (Signed)
[place] EMERGENCY DEPARTMENT AT [facility] Provider Note   CSN: [id number] Arrival date & time: 02/04/23  1336     History  Chief Complaint  Patient presents with   [name] is a 82 y.o. female.   Fall   82 year old female presents emergency department with complaints of with complaints of fall.  Patient states that she stood up from a seated position and felt lightheaded and "head rush" and subsequently fell on her bottom.  Patient denies trauma to head, loss of consciousness.  States that she "jarred" her right shoulder.  States she was seen on 01/26/2023 and diagnosed with right-sided humeral fracture at that time.  Has been following with orthopedics in the outpatient setting but is concerned about complicating injury from incident earlier.  States that she was prescribed muscle relaxer by orthopedic and took her first dose today around an hour before standing up from the seated position.  States that she felt a little drowsy after taking the medication and feels like that may have caused her symptoms.  Patient only requesting x-ray imaging of shoulder and does not want any laboratory or additional testing done regarding episode today.  Denies any chest pain, shortness of breath, Donnell pain, nausea, vomiting, urinary symptoms, change in bowel habits.  Past medical history significant for A-fib on warfarin, hypertension, hyperlipidemia, hypothyroidism, CVA, CKD, diabetes mellitus, polyneuropathy  Home Medications Prior to Admission medications   Medication Sig Start Date End Date Taking? Authorizing Provider  albuterol (VENTOLIN HFA) 108 (90 Base) MCG/ACT inhaler Inhale 2 puffs into the lungs every 6 (six) hours as needed for wheezing or shortness of breath. 09/29/21   Sharee Holster, NP  allopurinol (ZYLOPRIM) 100 MG tablet Take 2 tablets (200 mg total) by mouth daily. 09/29/21   Sharee Holster, NP  atorvastatin (LIPITOR) 20 MG tablet Take 1 tablet (20 mg  total) by mouth daily. Patient taking differently: Take 20 mg by mouth at bedtime. 09/29/21   Sharee Holster, NP  calcitRIOL (ROCALTROL) 0.5 MCG capsule Take 1 capsule (0.5 mcg total) by mouth daily. 0.25 mcg; amt: 1 tab; oral Once A Day Every Other Day  0.5 mcg; oral,Once A Day Every Other Day Patient taking differently: Take 0.25-0.5 mcg by mouth See admin instructions. 0.25 mcg; amt: 1 tab; oral Once A Day Every Other Day  0.5 mcg; oral,Once A Day Every Other Day 09/29/21   Sharee Holster, NP  Cholecalciferol (VITAMIN D3) 50 MCG (2000 UT) capsule Take 2,000 Units by mouth daily.    [provider]  citalopram (CELEXA) 20 MG tablet Take 1 tablet (20 mg total) by mouth daily. 09/29/21   Sharee Holster, NP  Cyanocobalamin (VITAMIN B-12 PO) Take 2,500-5,000 mcg by mouth every other day. 2500 mcg    [provider]  diclofenac Sodium (VOLTAREN) 1 % GEL Apply 2 g topically daily as needed (Pain). 2 mg; topical Special Instructions: apply to bilateral knees for pain management    [provider]  docusate sodium (COLACE) 100 MG capsule Take 1 capsule (100 mg total) by mouth 2 (two) times daily. 09/22/21   Shahmehdi, Seyed A, MD  Flaxseed, Linseed, (FLAX SEED OIL) 1000 MG CAPS Take 1,000 mg by mouth daily.    [provider]  flecainide (TAMBOCOR) 100 MG tablet Take 1 tablet (100 mg total) by mouth 2 (two) times daily. 09/29/21   Sharee Holster, NP  furosemide (LASIX) 80 MG tablet Take  2 tablets (160 mg total) by mouth daily. Patient taking differently: Take 80-160 mg by mouth See admin instructions. Take 160 mg in the morning and 80 mg at noon 09/29/21   Sharee Holster, NP  gabapentin (NEURONTIN) 100 MG capsule Take 1 capsule (100 mg total) by mouth 3 (three) times daily. 09/29/21   Sharee Holster, NP  glipiZIDE (GLUCOTROL XL) 10 MG 24 hr tablet Take 10 mg by mouth daily with breakfast. 06/22/22   [provider]  HYDROcodone-acetaminophen (NORCO) 7.5-325 MG  tablet Take 1 tablet by mouth every 4 (four) hours as needed for up to 5 days for severe pain or moderate pain (arthritis). 02/03/23 02/08/23  Vickki Hearing, MD  levothyroxine (SYNTHROID) 75 MCG tablet Take 1 tablet (75 mcg total) by mouth daily before breakfast. 09/29/21   Chilton Si, Chong Sicilian, NP  ondansetron (ZOFRAN) 4 MG tablet Take 1 tablet (4 mg total) by mouth every 8 (eight) hours as needed for up to 4 doses for nausea or vomiting. 01/26/23   Anders Simmonds T, DO  potassium chloride SA (KLOR-CON M) 20 MEQ tablet Take 20 mEq by mouth 2 (two) times a week.    [provider]  tiZANidine (ZANAFLEX) 4 MG tablet Take 1 tablet (4 mg total) by mouth every 6 (six) hours as needed for muscle spasms. 02/03/23   Vickki Hearing, MD  warfarin (COUMADIN) 6 MG tablet Take 1 tablet (6 mg total) by mouth at bedtime. Patient taking differently: Take 2.5-5 mg by mouth See admin instructions. Monday Wednesday Friday and Saturday 5mg  daily Tuesday Thursday Sunday 2.5 mg 09/29/21   Sharee Holster, NP      Allergies    Fiorinal [butalbital-aspirin-caffeine], Penicillins, and Tetanus toxoids    Review of Systems   Review of Systems  All other systems reviewed and are negative.   Physical Exam Updated Vital Signs BP (!) 100/55 (BP Location: Left Arm)   Pulse 69   Temp 98 F (36.7 C) (Oral)   Resp 18   Ht 5\' 5"  (1.651 m)   Wt 65.8 kg   SpO2 100%   BMI 24.13 kg/m  Physical Exam Vitals and nursing note reviewed.  Constitutional:      General: She is not in acute distress.    Appearance: She is well-developed.  HENT:     Head: Normocephalic and atraumatic.  Eyes:     Conjunctiva/sclera: Conjunctivae normal.  Cardiovascular:     Rate and Rhythm: Normal rate and regular rhythm.     Heart sounds: No murmur heard. Pulmonary:     Effort: Pulmonary effort is normal. No respiratory distress.     Breath sounds: Normal breath sounds.  Abdominal:     Palpations: Abdomen is soft.      Tenderness: There is no abdominal tenderness.  Musculoskeletal:        General: No swelling.     Cervical back: Neck supple.     Comments: No midline tenderness of cervical, thoracic, lumbar spine with no obvious type of or deformity noted.  No chest wall tenderness to palpation.  Tenderness of right proximal humerus but otherwise, no tenderness palpation of upper or lower extremities.  No sensory deficits noted right upper extremity along major nerve distributions.  Patient full range of motion of digits of right hand.  Skin:    General: Skin is warm and dry.     Capillary Refill: Capillary refill takes less than 2 seconds.  Neurological:     Mental  Status: She is alert.     Comments: Alert and oriented to self, place, time and event.   Speech is fluent, clear without dysarthria or dysphasia.   Strength symmetric in upper/lower extremities   Sensation intact in upper/lower extremities   Normal gait.  CN I not tested  CN II not tested CN III, IV, VI PERRLA and EOMs intact bilaterally  CN V Intact sensation to sharp and light touch to the face  CN VII facial movements symmetric  CN VIII not tested  CN IX, X no uvula deviation, symmetric rise of soft palate  CN XI SCM and trapezius strength bilaterally  CN XII Midline tongue protrusion, symmetric L/R movements     Psychiatric:        Mood and Affect: Mood normal.     ED Results / Procedures / Treatments   Labs (all labs ordered are listed, but only abnormal results are displayed) Labs Reviewed - No data to display  EKG None  Radiology DG Shoulder Right  Result Date: 02/04/2023 CLINICAL DATA:  History of right shoulder fracture status post recurrent fall EXAM: RIGHT SHOULDER - 3 VIEW COMPARISON:  Right shoulder radiograph dated 01/26/2023 FINDINGS: Again seen is comminuted fracture of the humeral head and neck in similar alignment with apex medial angulation. Interval development of early healing changes with callus  formation. Persistent inferior subluxation of the humeral head relative to the glenoid. Degenerative changes of the acromioclavicular joint. Partially imaged pacemaker leads project over the mediastinum. IMPRESSION: 1. Early healing changes of comminuted fracture of the humeral head and neck in similar alignment with apex medial angulation. No definite new fracture. 2. Persistent inferior subluxation of the humeral head relative to the glenoid. Electronically Signed   By: Agustin Cree M.D.   On: 02/04/2023 16:09    Procedures Procedures    Medications Ordered in ED Medications - No data to display  ED Course/ Medical Decision Making/ A&P                                 Medical Decision Making Amount and/or Complexity of Data Reviewed Radiology: ordered.   This patient presents to the ED for concern of fall, this involves an extensive number of treatment options, and is a complaint that carries with it a high risk of complications and morbidity.  The differential diagnosis includes fracture, strain/pain, dislocation, arrhythmia, structural heart disorder, valvular disorder, anemia, electrolyte derangement, CVA   Co morbidities that complicate the patient evaluation  See HPI   Additional history obtained:  Additional history obtained from EMR External records from outside source obtained and reviewed including hospital records   Lab Tests:  N/a   Imaging Studies ordered:  I ordered imaging studies including right shoulder x-ray I independently visualized and interpreted imaging which showed early healing changes of comminuted fracture of humeral head and neck.  Persistent inferior subluxation humeral head. I agree with the radiologist interpretation  Cardiac Monitoring: / EKG:  The patient was maintained on a cardiac monitor.  I personally viewed and interpreted the cardiac monitored which showed an underlying rhythm of: Irregular regular rhythm with normal  rate   Consultations Obtained:  N/a   Problem List / ED Course / Critical interventions / Medication management  Fall Reevaluation of the patient showed that the patient stayed the same I have reviewed the patients home medicines and have made adjustments as needed   Social Determinants of  Health:  Denies tobacco, licit drug use   Test / Admission - Considered:  Fall Vitals signs within normal range and stable throughout visit. Imaging studies significant for: See above 82 year old female presents emergency department after a fall.  Fall occurred after she stood up abruptly from a seated position after feeling somewhat drowsy after taking muscle relaxer.  Describes feelings of lightheadedness without syncope.  Patient landed on her bottom without trauma to head or loss of consciousness.  Patient mainly concerned about right shoulder given that she had prior fracture and is concerned about any complication occurred from incident earlier.  Regarding near syncopal fall, seems to be orthostatic mediated given that symptoms occurred when patient was changing position and patient currently without any neurodeficit.  Offered patient laboratory studies as well as intravenous fluids given the patient's initial blood pressure reading was 100/55 the patient declined.  I discussed with patient regarding risks of incomplete workup as well as untreated hypotension and she acknowledged understanding and still was insistent on just obtaining x-ray imaging and discharged if normal.  X-ray imagings were obtained which showed serial no acute traumatic change from prior x-ray performed.  Patient without any evidence of neurovascular compromise of right upper extremity on exam.  Patient will be recommended very close follow-up with primary care in the outpatient setting as well as cessation of muscle relaxers given concern of causing patient excessive drowsiness.  Treatment plan discussed at length with patient  and she acknowledged understanding was agreeable to said plan. Worrisome signs and symptoms were discussed with the patient, and the patient acknowledged understanding to return to the ED if noticed. Patient was stable upon discharge.          Final Clinical Impression(s) / ED Diagnoses Final diagnoses:  Fall, initial encounter    Rx / DC Orders ED Discharge Orders     None         Peter Garter, [place] 02/04/23 1741    Terrilee Files, MD 02/05/23 1733

## 2023-02-10 DIAGNOSIS — S42293D Other displaced fracture of upper end of unspecified humerus, subsequent encounter for fracture with routine healing: Secondary | ICD-10-CM | POA: Insufficient documentation

## 2023-02-11 ENCOUNTER — Other Ambulatory Visit (INDEPENDENT_AMBULATORY_CARE_PROVIDER_SITE_OTHER): Payer: Self-pay

## 2023-02-11 ENCOUNTER — Ambulatory Visit (INDEPENDENT_AMBULATORY_CARE_PROVIDER_SITE_OTHER): Payer: Medicare HMO | Admitting: Orthopedic Surgery

## 2023-02-11 VITALS — BP 178/99 | HR 60 | Ht 65.0 in | Wt 155.0 lb

## 2023-02-11 DIAGNOSIS — I4821 Permanent atrial fibrillation: Secondary | ICD-10-CM | POA: Diagnosis not present

## 2023-02-11 DIAGNOSIS — S42291D Other displaced fracture of upper end of right humerus, subsequent encounter for fracture with routine healing: Secondary | ICD-10-CM

## 2023-02-11 MED ORDER — HYDROCODONE-ACETAMINOPHEN 10-325 MG PO TABS
1.0000 | ORAL_TABLET | Freq: Four times a day (QID) | ORAL | 0 refills | Status: DC | PRN
Start: 2023-02-11 — End: 2023-02-18

## 2023-02-11 NOTE — Progress Notes (Signed)
Fracture care follow-up  82 year old female with three-part fracture right proximal humerus 1 week after initial films showed a fracture amenable to nonoperative treatment  The patient was having some left shoulder pain from muscle spasm which was a recurrent problem and was started on a muscle relaxer and passed out and had to go to the ER  X-rays do today  Patient planing of severe pain on hydrocodone 7.5 which she had been taking for chronic back pain  She also notes that the pharmacy says that the medication is on backorder  She is concerned about swelling in her arm and bruising and ecchymosis  She is on a blood thinner  Examination reveals that the arm is swollen but there is no blistering and the skin is still wrinkling, the swelling goes all the way down to the wrist area and seems to be just edematous fluid.  There are no signs of infection no blisters all the muscle function is normal  Her x-ray is identical to the prior x-ray.  And based on her history and medical problems and an appearance of the x-ray I agree to continue with nonoperative care x-ray in a week  We will [facility] and put her on 10 mg of hydrocodone Q6  She will use a heating pad for the shoulder on the left side   Meds ordered this encounter  Medications   HYDROcodone-acetaminophen (NORCO) 10-325 MG tablet    Sig: Take 1 tablet by mouth every 6 (six) hours as needed.    Dispense:  30 tablet    Refill:  0

## 2023-02-12 ENCOUNTER — Telehealth: Payer: Self-pay

## 2023-02-12 NOTE — Telephone Encounter (Signed)
Transition Care Management Unsuccessful Follow-up Telephone Call  Date of discharge and from where:  [facility]  Attempts:  1st Attempt  Reason for unsuccessful TCM follow-up call:  No answer/busy   Lenard Forth [place]  [facility], [facility], Phone: [phone] Website: [email or web address]

## 2023-02-15 ENCOUNTER — Telehealth: Payer: Self-pay

## 2023-02-15 NOTE — Telephone Encounter (Signed)
Transition Care Management Follow-up Telephone Call Date of discharge and from where: [name] How have you been since you were released from the hospital? Still in pain and following up with Ortho weekly Any questions or concerns? No  Items Reviewed: Did the pt receive and understand the discharge instructions provided? Yes  Medications obtained and verified? Yes  Other? No  Any new allergies since your discharge? No  Dietary orders reviewed? No Do you have support at home? Yes    Follow up appointments reviewed:  PCP Hospital f/u appt confirmed? No Scheduled to see  on  @ . Specialist Hospital f/u appt confirmed? Yes  Scheduled to see Ortho on WEEKLY @ . Are transportation arrangements needed? No  If their condition worsens, is the pt aware to call PCP or go to the Emergency Dept.? Yes Was the patient provided with contact information for the PCP's office or ED? Yes Was to pt encouraged to call back with questions or concerns? Yes

## 2023-02-18 ENCOUNTER — Ambulatory Visit (INDEPENDENT_AMBULATORY_CARE_PROVIDER_SITE_OTHER): Payer: Medicare HMO | Admitting: Orthopedic Surgery

## 2023-02-18 ENCOUNTER — Other Ambulatory Visit: Payer: Self-pay | Admitting: Orthopedic Surgery

## 2023-02-18 ENCOUNTER — Other Ambulatory Visit (INDEPENDENT_AMBULATORY_CARE_PROVIDER_SITE_OTHER): Payer: Self-pay

## 2023-02-18 DIAGNOSIS — S42291D Other displaced fracture of upper end of right humerus, subsequent encounter for fracture with routine healing: Secondary | ICD-10-CM

## 2023-02-18 MED ORDER — HYDROCODONE-ACETAMINOPHEN 10-325 MG PO TABS
1.0000 | ORAL_TABLET | Freq: Four times a day (QID) | ORAL | 0 refills | Status: AC | PRN
Start: 2023-02-18 — End: ?

## 2023-02-18 MED ORDER — GABAPENTIN 100 MG PO CAPS: 100.0000 mg | ORAL_CAPSULE | Freq: Three times a day (TID) | ORAL | 0 refills | Status: DC

## 2023-02-18 NOTE — Progress Notes (Signed)
Fracture care follow-up  Injury date January 26, 2023  Encounter Diagnosis  Name Primary?   Closed 3-part fracture of proximal end of right humerus with routine healing, subsequent encounter 01/26/23 Yes   Proximal humerus fracture fairly comminuted but nonoperative treatment chosen based on medical condition and configuration of the fracture  Patient's pain control is better with hydrocodone 02/25/2024 and she takes extra gabapentin at night to help with her left shoulder spasms  X-rays today show no change in position of the fracture she has a 3 fragment fracture which meets only criteria for nonoperative treatment.  Especially in the setting of her medical condition on warfarin and with multiple medical problems  Recommend physical therapy for 6 weeks follow-up in 6 weeks refill medication.  Meds ordered this encounter  Medications   HYDROcodone-acetaminophen (NORCO) 10-325 MG tablet    Sig: Take 1 tablet by mouth every 6 (six) hours as needed.    Dispense:  30 tablet    Refill:  0

## 2023-02-18 NOTE — Telephone Encounter (Signed)
Dr. Mort Sawyers pt - pt lvm stating she was seen this morning and the pharmacy still doesn't have her medication.  [phone]

## 2023-02-18 NOTE — Patient Instructions (Signed)
Physical therapy has been ordered for you at [place]. They should call you to schedule, [phone] is the phone number to call, if you want to call to schedule.   

## 2023-02-19 MED ORDER — HYDROCODONE-ACETAMINOPHEN 10-325 MG PO TABS
1.0000 | ORAL_TABLET | Freq: Four times a day (QID) | ORAL | 0 refills | Status: DC | PRN
Start: 2023-02-19 — End: 2023-03-11

## 2023-02-23 DIAGNOSIS — M109 Gout, unspecified: Secondary | ICD-10-CM | POA: Diagnosis not present

## 2023-02-23 DIAGNOSIS — E66812 Obesity, class 2: Secondary | ICD-10-CM | POA: Diagnosis not present

## 2023-02-23 DIAGNOSIS — I480 Paroxysmal atrial fibrillation: Secondary | ICD-10-CM | POA: Diagnosis not present

## 2023-02-23 DIAGNOSIS — E782 Mixed hyperlipidemia: Secondary | ICD-10-CM | POA: Diagnosis not present

## 2023-02-23 DIAGNOSIS — E1122 Type 2 diabetes mellitus with diabetic chronic kidney disease: Secondary | ICD-10-CM | POA: Diagnosis not present

## 2023-02-23 DIAGNOSIS — N2581 Secondary hyperparathyroidism of renal origin: Secondary | ICD-10-CM | POA: Diagnosis not present

## 2023-02-23 DIAGNOSIS — N183 Chronic kidney disease, stage 3 unspecified: Secondary | ICD-10-CM | POA: Diagnosis not present

## 2023-02-23 DIAGNOSIS — I129 Hypertensive chronic kidney disease with stage 1 through stage 4 chronic kidney disease, or unspecified chronic kidney disease: Secondary | ICD-10-CM | POA: Diagnosis not present

## 2023-03-09 ENCOUNTER — Ambulatory Visit (HOSPITAL_COMMUNITY): Payer: Medicare HMO | Admitting: Occupational Therapy

## 2023-03-11 ENCOUNTER — Ambulatory Visit (HOSPITAL_COMMUNITY): Payer: Medicare HMO | Attending: Orthopedic Surgery | Admitting: Occupational Therapy

## 2023-03-11 ENCOUNTER — Other Ambulatory Visit: Payer: Self-pay | Admitting: Orthopedic Surgery

## 2023-03-11 ENCOUNTER — Encounter (HOSPITAL_COMMUNITY): Payer: Self-pay | Admitting: Occupational Therapy

## 2023-03-11 ENCOUNTER — Other Ambulatory Visit: Payer: Self-pay

## 2023-03-11 DIAGNOSIS — S42291D Other displaced fracture of upper end of right humerus, subsequent encounter for fracture with routine healing: Secondary | ICD-10-CM | POA: Diagnosis not present

## 2023-03-11 DIAGNOSIS — M25511 Pain in right shoulder: Secondary | ICD-10-CM | POA: Diagnosis not present

## 2023-03-11 DIAGNOSIS — M25611 Stiffness of right shoulder, not elsewhere classified: Secondary | ICD-10-CM | POA: Diagnosis not present

## 2023-03-11 DIAGNOSIS — R29898 Other symptoms and signs involving the musculoskeletal system: Secondary | ICD-10-CM | POA: Insufficient documentation

## 2023-03-11 DIAGNOSIS — I4821 Permanent atrial fibrillation: Secondary | ICD-10-CM | POA: Diagnosis not present

## 2023-03-11 MED ORDER — HYDROCODONE-ACETAMINOPHEN 10-325 MG PO TABS
1.0000 | ORAL_TABLET | Freq: Three times a day (TID) | ORAL | 0 refills | Status: AC | PRN
Start: 2023-03-11 — End: 2023-03-16

## 2023-03-11 NOTE — Patient Instructions (Signed)

## 2023-03-11 NOTE — Telephone Encounter (Signed)
Dr. Mort Sawyers pt - spoke w/the pt, she is requesting a refill on Hydrocodone 10-325, 30 tablets, every 6 hours PRN to be sent to [facility]

## 2023-03-11 NOTE — Therapy (Signed)
OUTPATIENT OCCUPATIONAL THERAPY ORTHO EVALUATION  Patient Name: [name] MRN: [id number] DOB:1941-01-22, 82 y.o., female Today's Date: 03/14/2023   END OF SESSION:  OT End of Session - 03/14/23 1912     Visit Number 1    Number of Visits 13    Date for OT Re-Evaluation 04/30/23    Authorization Type [employer]    OT Start Time 1520    OT Stop Time 1603    OT Time Calculation (min) 43 min    Activity Tolerance Patient tolerated treatment well    Behavior During Therapy WFL for tasks assessed/performed             Past Medical History:  Diagnosis Date   A-fib (HCC)    Arthritis    spinal   Chronic kidney disease    Colon polyps    CVA (cerebral vascular accident) (HCC)    left sided weakness. Has 40% weakness of left arm   Depression    DM (diabetes mellitus) (HCC)    GERD (gastroesophageal reflux disease)    HTN (hypertension)    Hypercholesterolemia    Hypothyroidism    Pancreatitis    biliary   S/P colonoscopy 07/2004   Dr. Juanda Chance: per e-chart report: diverticulosis, path adenomatous polyps   S/P endoscopy 07/2007   multiple 3-4 mmsessile gastric polyps, benign, singl antral erosions, negative H.pylori, reactive gastropathy ? 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   Past Surgical History:  Procedure Laterality Date   CATARACT EXTRACTION W/PHACO Left 04/07/2021   Procedure: CATARACT EXTRACTION PHACO AND INTRAOCULAR LENS PLACEMENT LEFT EYE;  Surgeon: Fabio Pierce, MD;  Location: [facility];  Service: Ophthalmology;  Laterality: Left;  left CDE=11.11   CATARACT EXTRACTION W/PHACO Right 04/21/2021   Procedure: CATARACT EXTRACTION PHACO AND INTRAOCULAR LENS PLACEMENT RIGHT EYE;  Surgeon: Fabio Pierce, MD;  Location: [facility];  Service: Ophthalmology;  Laterality: Right;  right CDE=10.12   CHOLECYSTECTOMY     COLONOSCOPY  03/10/2011   Procedure: COLONOSCOPY;  Surgeon: Corbin Ade, MD;  Location: [facility];   Service: Endoscopy;  Laterality: N/A;  12:45   COLONOSCOPY WITH PROPOFOL N/A 07/22/2020   Procedure: COLONOSCOPY WITH PROPOFOL;  Surgeon: Corbin Ade, MD;  Location: [facility];  Service: Endoscopy;  Laterality: N/A;  10:30am   HIP PINNING,CANNULATED Right 09/20/2021   Procedure: CANNULATED HIP PINNING;  Surgeon: Vickki Hearing, MD;  Location: [facility];  Service: Orthopedics;  Laterality: Right;   PACEMAKER INSERTION  02/05/2012   SJM Accent DR RF implanted by Dr Johney Frame for symptomatic bradycardia   PERMANENT PACEMAKER INSERTION N/A 02/05/2012   Procedure: PERMANENT PACEMAKER INSERTION;  Surgeon: Hillis Range, MD;  Location: [facility] CATH LAB;  Service: Cardiovascular;  Laterality: N/A;   POLYPECTOMY  07/22/2020   Procedure: POLYPECTOMY;  Surgeon: Corbin Ade, MD;  Location: [facility];  Service: Endoscopy;;   PPM GENERATOR CHANGEOUT N/A 07/06/2022   Procedure: PPM GENERATOR CHANGEOUT;  Surgeon: Marinus Maw, MD;  Location: [facility] INVASIVE CV LAB;  Service: Cardiovascular;  Laterality: N/A;   SHOULDER SURGERY  2008   left shoulder replacement due to fracture    SKIN CANCER EXCISION Left    Skin cancer on left side of face.   Patient Active Problem List   Diagnosis Date Noted   Closed 3-part fracture of proximal humerus with routine healing 02/10/2023   Postoperative anemia 09/25/2021   Secondary hyperparathyroidism (HCC) 09/24/2021  CKD stage 3 due to type 2 diabetes mellitus (HCC) 09/24/2021   Major depression, recurrent, chronic (HCC) 09/24/2021   Chronic gout due to renal impairment 09/24/2021   Bilateral lower extremity edema 09/24/2021   Polyneuropathy due to type 2 diabetes mellitus (HCC) 09/24/2021   Hyperlipidemia associated with type 2 diabetes mellitus (HCC) 09/24/2021   Chronic constipation 09/24/2021   Thrombocytopenia (HCC) 09/24/2021   Anticoagulation goal of INR 2 to 3 09/24/2021   Closed fracture of neck of right femur (HCC) 09/19/2021   Neck pain 06/12/2021    Chronic pain syndrome 01/20/2021   Hypokalemia 04/29/2020   Pacemaker-St.Jude 02/09/2012   Atrial fibrillation (HCC) 02/04/2012   Type 2 diabetes mellitus (HCC) 04/25/2007   Essential (primary) hypertension 04/25/2007   Adult hypothyroidism 04/25/2007    PCP: Benita Stabile, MD REFERRING PROVIDER: Fuller Canada, MD  ONSET DATE: 01/26/23  REFERRING DIAG: 3-part fracture of proximal R shoulder  THERAPY DIAG:  Acute pain of right shoulder  Shoulder stiffness, right  Other symptoms and signs involving the musculoskeletal system  Rationale for Evaluation and Treatment: Rehabilitation  SUBJECTIVE:   SUBJECTIVE STATEMENT: "Sitting here in the sling I feel ok" Pt accompanied by: self  PERTINENT HISTORY: Pt reports that she felt dizzy standing up and then found herself on the floor. X-Ray indicates 3 part proximal shoulder fractures. Past medical history significant for A-fib on warfarin, hypertension, hyperlipidemia, hypothyroidism, CVA, CKD, diabetes mellitus, polyneuropathy. 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 She has 1 dog.   PRECAUTIONS: Shoulder  WEIGHT BEARING RESTRICTIONS: Yes >5lbs  PAIN:  Are you having pain?  Only when she moves her arm  FALLS: Has patient fallen in last 6 months? Yes. Number of falls multiple  PLOF: Independent  PATIENT GOALS: To be able to use my arm the same as before  NEXT MD VISIT: 04/01/23  OBJECTIVE:   HAND DOMINANCE: Right  ADLs: Overall ADLs: Pt having difficulty with dressing due to pain, as well as she is unable to lift her arm up to wash her hair with BUE. Pt limiting use of RUE during cooking and cleaning due to pain and inability to lift up the arm.   FUNCTIONAL OUTCOME MEASURES: Upper Extremity Functional Scale (UEFS): 48.8%  UPPER EXTREMITY ROM:       Assessed in seated, er/IR adducted  Active ROM Right eval  Shoulder flexion 58  Shoulder abduction 55  Shoulder internal rotation 90  Shoulder external rotation 17   Elbow flexion 144  Elbow Extension -37  (Blank rows = not tested)    UPPER EXTREMITY MMT:     Assessed in seated, er/IR adducted  MMT Right eval  Shoulder flexion   Shoulder abduction   Shoulder internal rotation   Shoulder external rotation   (Blank rows = not tested)  SENSATION: WFL  EDEMA: Mild swelling in the elbow  OBSERVATIONS: moderate fascial restrictions along the biceps, trapezius, pectoralis, and scapular region.    TODAY'S TREATMENT:  DATE: 03/11/23: Evaluation Only    PATIENT EDUCATION: Education details: Table Slides Person educated: Patient Education method: Explanation, Demonstration, and Handouts Education comprehension: verbalized understanding, returned demonstration, and needs further education  HOME EXERCISE PROGRAM: 10/17: Table Slides  GOALS: Goals reviewed with patient? Yes   SHORT TERM GOALS: Target date: 04/09/23  Pt will be provided with and educated on HEP to improve mobility in RUE required for use during ADL completion.   Goal status: INITIAL  2.  Pt will increase RUE P/ROM by 50 degrees to improve ability to use RUE during dressing tasks with minimal compensatory techniques.   Goal status: INITIAL  3.  Pt will increase RUE strength to 3+/5 to improve ability to reach for items at waist to chest height during bathing and grooming tasks.   Goal status: INITIAL  LONG TERM GOALS: Target date: 04/30/23  Pt will decrease pain in RUE to 3/10 or less to improve ability to sleep for 2+ consecutive hours without waking due to pain.   Goal status: INITIAL  2.  Pt will decrease RUE fascial restrictions to min amounts or less to improve mobility required for functional reaching tasks.   Goal status: INITIAL  3.  Pt will increase RUE A/ROM by 50 degrees to improve ability to use RUE when reaching overhead or  behind back during dressing and bathing tasks.   Goal status: INITIAL  4.  Pt will increase RUE strength to 4+/5 or greater to improve ability to use RUE when lifting or carrying items during meal preparation/housework/yardwork tasks.   Goal status: INITIAL  5.  Pt will return to highest level of function using RUE as dominant during functional task completion.   Goal status: INITIAL   ASSESSMENT:  CLINICAL IMPRESSION: Patient is a 82 y.o. female who was seen today for occupational therapy evaluation for 3 part R proximal shoulder fractures. Pt presents with increased pain and fascial restrictions, decreased ROM, strength, and functional use of the RUE.   PERFORMANCE DEFICITS: in functional skills including in functional skills including ADLs, IADLs, coordination, tone, ROM, strength, pain, fascial restrictions, muscle spasms, and UE functional use.  IMPAIRMENTS: are limiting patient from ADLs, IADLs, rest and sleep, work, leisure, and social participation.   COMORBIDITIES: has no other co-morbidities that affects occupational performance. Patient will benefit from skilled OT to address above impairments and improve overall function.  MODIFICATION OR ASSISTANCE TO COMPLETE EVALUATION: No modification of tasks or assist necessary to complete an evaluation.  OT OCCUPATIONAL PROFILE AND HISTORY: Problem focused assessment: Including review of records relating to presenting problem.  CLINICAL DECISION MAKING: LOW - limited treatment options, no task modification necessary  REHAB POTENTIAL: Good  EVALUATION COMPLEXITY: Low      PLAN:  OT FREQUENCY: 2x/week  OT DURATION: 6 weeks  PLANNED INTERVENTIONS: self care/ADL training, therapeutic exercise, therapeutic activity, neuromuscular re-education, manual therapy, passive range of motion, splinting, electrical stimulation, ultrasound, moist heat, cryotherapy, patient/family education, and DME and/or AE instructions  RECOMMENDED  OTHER SERVICES: N/A  CONSULTED AND AGREED WITH PLAN OF CARE: Patient  PLAN FOR NEXT SESSION: Manual Therapy, P/ROM, AA/ROM, Isometrics, wall slides   Trish Mage, OTR/L [facility] [phone] Arsalan Brisbin Rosemarie Beath, OT 03/14/2023, 7:13 PM

## 2023-03-16 DIAGNOSIS — I4891 Unspecified atrial fibrillation: Secondary | ICD-10-CM | POA: Diagnosis not present

## 2023-03-16 DIAGNOSIS — E1169 Type 2 diabetes mellitus with other specified complication: Secondary | ICD-10-CM | POA: Diagnosis not present

## 2023-03-16 DIAGNOSIS — F331 Major depressive disorder, recurrent, moderate: Secondary | ICD-10-CM | POA: Diagnosis not present

## 2023-03-16 DIAGNOSIS — I10 Essential (primary) hypertension: Secondary | ICD-10-CM | POA: Diagnosis not present

## 2023-03-16 DIAGNOSIS — M109 Gout, unspecified: Secondary | ICD-10-CM | POA: Insufficient documentation

## 2023-03-16 DIAGNOSIS — M8580 Other specified disorders of bone density and structure, unspecified site: Secondary | ICD-10-CM | POA: Insufficient documentation

## 2023-03-16 DIAGNOSIS — G609 Hereditary and idiopathic neuropathy, unspecified: Secondary | ICD-10-CM | POA: Diagnosis not present

## 2023-03-16 DIAGNOSIS — E1122 Type 2 diabetes mellitus with diabetic chronic kidney disease: Secondary | ICD-10-CM | POA: Diagnosis not present

## 2023-03-16 DIAGNOSIS — E039 Hypothyroidism, unspecified: Secondary | ICD-10-CM | POA: Diagnosis not present

## 2023-03-16 DIAGNOSIS — N1831 Chronic kidney disease, stage 3a: Secondary | ICD-10-CM | POA: Diagnosis not present

## 2023-03-16 DIAGNOSIS — Z23 Encounter for immunization: Secondary | ICD-10-CM | POA: Diagnosis not present

## 2023-03-16 DIAGNOSIS — E782 Mixed hyperlipidemia: Secondary | ICD-10-CM | POA: Diagnosis not present

## 2023-03-16 DIAGNOSIS — S42201A Unspecified fracture of upper end of right humerus, initial encounter for closed fracture: Secondary | ICD-10-CM | POA: Insufficient documentation

## 2023-04-01 ENCOUNTER — Ambulatory Visit: Payer: Medicare HMO | Admitting: Orthopedic Surgery

## 2023-04-01 ENCOUNTER — Other Ambulatory Visit (INDEPENDENT_AMBULATORY_CARE_PROVIDER_SITE_OTHER): Payer: Self-pay

## 2023-04-01 DIAGNOSIS — S42291D Other displaced fracture of upper end of right humerus, subsequent encounter for fracture with routine healing: Secondary | ICD-10-CM

## 2023-04-01 NOTE — Progress Notes (Signed)
Frx care   Encounter Diagnosis  Name Primary?   Closed 3-part fracture of proximal end of right humerus with routine healing, subsequent encounter 01/26/23 Yes    Chief Complaint  Patient presents with   Shoulder Injury    01/26/23 right shoulder fracture / xray today had a bad day yesterday better today     Doing well except for some neck pain   She s doing her own therapy   She has   Right Shoulder Exam   Tenderness  The patient is experiencing no tenderness.  Range of Motion  Active abduction:  90  External rotation:  40  Forward flexion:  110     Xrays: looks great !   Rec release normal activity work on motion and strength

## 2023-04-02 ENCOUNTER — Encounter (HOSPITAL_COMMUNITY): Payer: Medicare HMO | Admitting: Occupational Therapy

## 2023-04-07 ENCOUNTER — Ambulatory Visit (INDEPENDENT_AMBULATORY_CARE_PROVIDER_SITE_OTHER): Payer: Medicare HMO

## 2023-04-07 DIAGNOSIS — I495 Sick sinus syndrome: Secondary | ICD-10-CM | POA: Diagnosis not present

## 2023-04-07 LAB — CUP PACEART REMOTE DEVICE CHECK
Battery Remaining Longevity: 103 mo
Battery Remaining Percentage: 95 %
Battery Voltage: 2.99 V
Brady Statistic AP VP Percent: 13 %
Brady Statistic AP VS Percent: 73 %
Brady Statistic AS VP Percent: 4.2 %
Brady Statistic AS VS Percent: 9.3 %
Brady Statistic RA Percent Paced: 85 %
Brady Statistic RV Percent Paced: 18 %
Date Time Interrogation Session: 20241113032345
Implantable Lead Connection Status: 753985
Implantable Lead Connection Status: 753985
Implantable Lead Implant Date: 20130913
Implantable Lead Implant Date: 20130913
Implantable Lead Location: 753859
Implantable Lead Location: 753860
Implantable Lead Model: 1948
Implantable Pulse Generator Implant Date: 20240212
Lead Channel Impedance Value: 390 Ohm
Lead Channel Impedance Value: 540 Ohm
Lead Channel Pacing Threshold Amplitude: 0.75 V
Lead Channel Pacing Threshold Amplitude: 1.25 V
Lead Channel Pacing Threshold Pulse Width: 0.5 ms
Lead Channel Pacing Threshold Pulse Width: 0.5 ms
Lead Channel Sensing Intrinsic Amplitude: 12 mV
Lead Channel Sensing Intrinsic Amplitude: 3.1 mV
Lead Channel Setting Pacing Amplitude: 2 V
Lead Channel Setting Pacing Amplitude: 2.5 V
Lead Channel Setting Pacing Pulse Width: 0.5 ms
Lead Channel Setting Sensing Sensitivity: 2 mV
Pulse Gen Model: 2272
Pulse Gen Serial Number: 8153935

## 2023-04-08 DIAGNOSIS — I4821 Permanent atrial fibrillation: Secondary | ICD-10-CM | POA: Diagnosis not present

## 2023-04-13 ENCOUNTER — Ambulatory Visit (HOSPITAL_COMMUNITY)
Admission: RE | Admit: 2023-04-13 | Discharge: 2023-04-13 | Disposition: A | Discharge status: Home / Self Care | Payer: Medicare HMO | Source: Ambulatory Visit | Attending: Family Medicine | Admitting: Family Medicine

## 2023-04-13 ENCOUNTER — Other Ambulatory Visit (HOSPITAL_COMMUNITY): Payer: Self-pay | Admitting: Family Medicine

## 2023-04-13 DIAGNOSIS — S0003XA Contusion of scalp, initial encounter: Secondary | ICD-10-CM | POA: Diagnosis not present

## 2023-04-13 DIAGNOSIS — W19XXXA Unspecified fall, initial encounter: Secondary | ICD-10-CM | POA: Diagnosis not present

## 2023-04-13 DIAGNOSIS — S0990XA Unspecified injury of head, initial encounter: Secondary | ICD-10-CM | POA: Diagnosis not present

## 2023-04-13 DIAGNOSIS — R519 Headache, unspecified: Secondary | ICD-10-CM | POA: Diagnosis not present

## 2023-04-13 DIAGNOSIS — Z8673 Personal history of transient ischemic attack (TIA), and cerebral infarction without residual deficits: Secondary | ICD-10-CM | POA: Insufficient documentation

## 2023-04-13 DIAGNOSIS — I63511 Cerebral infarction due to unspecified occlusion or stenosis of right middle cerebral artery: Secondary | ICD-10-CM | POA: Diagnosis not present

## 2023-04-13 DIAGNOSIS — S0083XA Contusion of other part of head, initial encounter: Secondary | ICD-10-CM | POA: Diagnosis not present

## 2023-04-23 ENCOUNTER — Other Ambulatory Visit: Payer: Self-pay | Admitting: Orthopedic Surgery

## 2023-04-28 NOTE — Progress Notes (Signed)
Remote pacemaker transmission.   

## 2023-05-06 DIAGNOSIS — I4821 Permanent atrial fibrillation: Secondary | ICD-10-CM | POA: Diagnosis not present

## 2023-05-24 ENCOUNTER — Other Ambulatory Visit: Payer: Self-pay | Admitting: Orthopedic Surgery

## 2023-06-03 DIAGNOSIS — I4821 Permanent atrial fibrillation: Secondary | ICD-10-CM | POA: Diagnosis not present

## 2023-06-30 ENCOUNTER — Telehealth: Payer: Self-pay

## 2023-06-30 NOTE — Telephone Encounter (Signed)
Device alert for exceed AT/AF AFL in progress from 12/5, - route to triage per protocol Overall controlled rates, on Warfarin per PA report,  burden 3.8%  LM to call back and assess symptoms with ongoing AFL.

## 2023-07-01 NOTE — Telephone Encounter (Signed)
 Patient reports being under a lot of stress lately. Also reports symptoms or norovirus and upper respiratory symptoms although feeling better the last few days.   Patient reports yesterday she did note palpitations and she thought it was a good day for her. She was associating with recent sickness.   Recommend follow up with AF clinic (patient has seen in past) to discuss increase in AF further. Patient agreeable. Patient aware to call with further questions or concerns arise.

## 2023-07-02 DIAGNOSIS — I4821 Permanent atrial fibrillation: Secondary | ICD-10-CM | POA: Diagnosis not present

## 2023-07-07 ENCOUNTER — Ambulatory Visit (INDEPENDENT_AMBULATORY_CARE_PROVIDER_SITE_OTHER): Payer: Medicare HMO

## 2023-07-07 ENCOUNTER — Telehealth: Payer: Self-pay

## 2023-07-07 DIAGNOSIS — I495 Sick sinus syndrome: Secondary | ICD-10-CM | POA: Diagnosis not present

## 2023-07-07 LAB — CUP PACEART REMOTE DEVICE CHECK
Battery Remaining Longevity: 102 mo
Battery Remaining Percentage: 93 %
Battery Voltage: 2.99 V
Brady Statistic AP VP Percent: 11 %
Brady Statistic AP VS Percent: 65 %
Brady Statistic AS VP Percent: 14 %
Brady Statistic AS VS Percent: 9.5 %
Brady Statistic RA Percent Paced: 70 %
Brady Statistic RV Percent Paced: 27 %
Date Time Interrogation Session: 20250212041007
Implantable Lead Connection Status: 753985
Implantable Lead Connection Status: 753985
Implantable Lead Implant Date: 20130913
Implantable Lead Implant Date: 20130913
Implantable Lead Location: 753859
Implantable Lead Location: 753860
Implantable Lead Model: 1948
Implantable Pulse Generator Implant Date: 20240212
Lead Channel Impedance Value: 480 Ohm
Lead Channel Impedance Value: 610 Ohm
Lead Channel Pacing Threshold Amplitude: 0.75 V
Lead Channel Pacing Threshold Amplitude: 1.25 V
Lead Channel Pacing Threshold Pulse Width: 0.5 ms
Lead Channel Pacing Threshold Pulse Width: 0.5 ms
Lead Channel Sensing Intrinsic Amplitude: 12 mV
Lead Channel Sensing Intrinsic Amplitude: 2.7 mV
Lead Channel Setting Pacing Amplitude: 2 V
Lead Channel Setting Pacing Amplitude: 2.5 V
Lead Channel Setting Pacing Pulse Width: 0.5 ms
Lead Channel Setting Sensing Sensitivity: 2 mV
Pulse Gen Model: 2272
Pulse Gen Serial Number: 8153935

## 2023-07-07 NOTE — Telephone Encounter (Signed)
Presents in AT/atrial flutter with some P waves falling in refractory leading to atrial pacing during the atrial arrhythmia.  AMS is not occurring.  AF burden may be under reported by device.  Via trending, burden has increased since ~ 1/20 with recurrent AF alerts.

## 2023-07-08 NOTE — Telephone Encounter (Signed)
Transmission reviewed.  Appears Pt is in an atrial tachycardia.  Pt's mode switch is 180 BPM.  Would consider decreasing this value to allow Pt to mode switch and to more appropriately document AT episodes.

## 2023-07-08 NOTE — Telephone Encounter (Signed)
LM for patient:   Asking whether she can come by Device Clinic at 130pm before AF clinic appt on 07/14/23 at 230pm so that we can make programming adjustment to PVARP, if appropriate to decrease competitive AP and under sensing of AF events.

## 2023-07-10 LAB — LAB REPORT - SCANNED
A1c: 6.6
Albumin, Urine POC: 3
Calcium: 9.4
Creatinine, POC: 12 mg/dL
EGFR: 39
Free T4: 1.27 ng/dL
Microalb Creat Ratio: 25
TSH: 0.72 (ref 0.41–5.90)

## 2023-07-11 ENCOUNTER — Encounter: Payer: Self-pay | Admitting: Internal Medicine

## 2023-07-14 ENCOUNTER — Ambulatory Visit (HOSPITAL_COMMUNITY): Payer: Medicare HMO | Admitting: Physician Assistant

## 2023-07-16 DIAGNOSIS — I129 Hypertensive chronic kidney disease with stage 1 through stage 4 chronic kidney disease, or unspecified chronic kidney disease: Secondary | ICD-10-CM | POA: Diagnosis not present

## 2023-07-16 DIAGNOSIS — I4891 Unspecified atrial fibrillation: Secondary | ICD-10-CM | POA: Diagnosis not present

## 2023-07-16 DIAGNOSIS — E782 Mixed hyperlipidemia: Secondary | ICD-10-CM | POA: Diagnosis not present

## 2023-07-16 DIAGNOSIS — M546 Pain in thoracic spine: Secondary | ICD-10-CM | POA: Insufficient documentation

## 2023-07-16 DIAGNOSIS — F331 Major depressive disorder, recurrent, moderate: Secondary | ICD-10-CM | POA: Diagnosis not present

## 2023-07-16 DIAGNOSIS — G609 Hereditary and idiopathic neuropathy, unspecified: Secondary | ICD-10-CM | POA: Diagnosis not present

## 2023-07-16 DIAGNOSIS — E039 Hypothyroidism, unspecified: Secondary | ICD-10-CM | POA: Diagnosis not present

## 2023-07-16 DIAGNOSIS — N1831 Chronic kidney disease, stage 3a: Secondary | ICD-10-CM | POA: Diagnosis not present

## 2023-07-16 DIAGNOSIS — I10 Essential (primary) hypertension: Secondary | ICD-10-CM | POA: Diagnosis not present

## 2023-07-16 DIAGNOSIS — E1169 Type 2 diabetes mellitus with other specified complication: Secondary | ICD-10-CM | POA: Diagnosis not present

## 2023-07-16 DIAGNOSIS — S42201D Unspecified fracture of upper end of right humerus, subsequent encounter for fracture with routine healing: Secondary | ICD-10-CM | POA: Diagnosis not present

## 2023-07-16 DIAGNOSIS — M109 Gout, unspecified: Secondary | ICD-10-CM | POA: Diagnosis not present

## 2023-07-16 DIAGNOSIS — M8580 Other specified disorders of bone density and structure, unspecified site: Secondary | ICD-10-CM | POA: Diagnosis not present

## 2023-07-23 ENCOUNTER — Ambulatory Visit (HOSPITAL_COMMUNITY)
Admission: RE | Admit: 2023-07-23 | Discharge: 2023-07-23 | Disposition: A | Discharge status: Home / Self Care | Payer: Medicare HMO | Source: Ambulatory Visit | Attending: Physician Assistant | Admitting: Physician Assistant

## 2023-07-23 VITALS — BP 150/68 | HR 74 | Ht 65.0 in | Wt 164.6 lb

## 2023-07-23 DIAGNOSIS — I130 Hypertensive heart and chronic kidney disease with heart failure and stage 1 through stage 4 chronic kidney disease, or unspecified chronic kidney disease: Secondary | ICD-10-CM | POA: Diagnosis not present

## 2023-07-23 DIAGNOSIS — Z79899 Other long term (current) drug therapy: Secondary | ICD-10-CM | POA: Diagnosis not present

## 2023-07-23 DIAGNOSIS — E1122 Type 2 diabetes mellitus with diabetic chronic kidney disease: Secondary | ICD-10-CM | POA: Diagnosis not present

## 2023-07-23 DIAGNOSIS — Z950 Presence of cardiac pacemaker: Secondary | ICD-10-CM | POA: Diagnosis not present

## 2023-07-23 DIAGNOSIS — Z7901 Long term (current) use of anticoagulants: Secondary | ICD-10-CM | POA: Diagnosis not present

## 2023-07-23 DIAGNOSIS — I4819 Other persistent atrial fibrillation: Secondary | ICD-10-CM | POA: Diagnosis not present

## 2023-07-23 DIAGNOSIS — I4892 Unspecified atrial flutter: Secondary | ICD-10-CM | POA: Diagnosis not present

## 2023-07-23 DIAGNOSIS — E785 Hyperlipidemia, unspecified: Secondary | ICD-10-CM | POA: Diagnosis not present

## 2023-07-23 DIAGNOSIS — Z5181 Encounter for therapeutic drug level monitoring: Secondary | ICD-10-CM | POA: Insufficient documentation

## 2023-07-23 DIAGNOSIS — N189 Chronic kidney disease, unspecified: Secondary | ICD-10-CM | POA: Insufficient documentation

## 2023-07-23 DIAGNOSIS — Z8673 Personal history of transient ischemic attack (TIA), and cerebral infarction without residual deficits: Secondary | ICD-10-CM | POA: Insufficient documentation

## 2023-07-23 DIAGNOSIS — D6869 Other thrombophilia: Secondary | ICD-10-CM | POA: Diagnosis not present

## 2023-07-23 MED ORDER — METOPROLOL SUCCINATE ER 25 MG PO TB24: 25.0000 mg | ORAL_TABLET | Freq: Every day | ORAL | 4 refills | Status: DC

## 2023-07-23 NOTE — Patient Instructions (Signed)
 Start metoprolol 25mg  once a day   Will need 4 weekly INRs (in range) prior to cardioversion  Once you have 2 in a row that are within range call Hanley Woerner to setup date for cardioversion -- [phone]   Will need to go to [facility] in [place] for labs (orders attached) 1 week prior to cardioversion   Cardioversion scheduled for:   - Arrive at the [employer] and go to admitting at   - Do not eat or drink anything after midnight the night prior to your procedure.   - Take all your morning medication (except diabetic medications) with a sip of water prior to arrival.  - You will not be able to drive home after your procedure.    - Do NOT miss any doses of your blood thinner - if you should miss a dose please notify our office immediately.   - If you feel as if you go back into normal rhythm prior to scheduled cardioversion, please notify our office immediately.   If your procedure is canceled in the cardioversion suite you will be charged a cancellation fee.

## 2023-07-23 NOTE — Progress Notes (Addendum)
 Primary Care Physician: Benita Stabile, MD Primary Cardiologist: None Electrophysiologist: Lewayne Bunting, MD  Referring Physician: Device clinic   [name] is a 83 y.o. female with a history of HTN, CKD, CVA, HLD, symptomatic bradycardia s/p PPM, DM, atrial flutter, atrial fibrillation who presents for follow up in the [facility].  The patient has been maintained on flecainide for rhythm control and warfarin for stroke prevention.   Patient presents today for follow up for atrial fibrillation, atrial flutter, and flecainide monitoring. The device clinic received an alert for a high atrial flutter burden on 06/30/23. This had been ongoing since December. AF undersensing also suspected. She remains out of rhythm today with symptoms of fatigue, palpitations, SOB, and intermittent dizziness.   Today, she denies symptoms of chest pain, orthopnea, PND, lower extremity edema, presyncope, syncope, snoring, daytime somnolence, bleeding, or neurologic sequela. The patient is tolerating medications without difficulties and is otherwise without complaint today.    Atrial Fibrillation Risk Factors:  she does not have symptoms or diagnosis of sleep apnea. she does not have a history of rheumatic fever.   Atrial Fibrillation Management history:  Previous antiarrhythmic drugs: flecainide  Previous cardioversions: none Previous ablations: none Anticoagulation history: warfarin   ROS- All systems are reviewed and negative except as per the HPI above.  Past Medical History:  Diagnosis Date   A-fib [facility])    Arthritis    spinal   Chronic kidney disease    Colon polyps    CVA (cerebral vascular accident) (HCC)    left sided weakness. Has 40% weakness of left arm   Depression    DM (diabetes mellitus) (HCC)    GERD (gastroesophageal reflux disease)    HTN (hypertension)    Hypercholesterolemia    Hypothyroidism    Pancreatitis    biliary   S/P colonoscopy  07/2004   Dr. Juanda Chance: per e-chart report: diverticulosis, path adenomatous polyps   S/P endoscopy 07/2007   multiple 3-4 mmsessile gastric polyps, benign, singl antral erosions, negative H.pylori, reactive gastropathy ? 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 6.7 g 0   allopurinol (ZYLOPRIM) 100 MG tablet Take 2 tablets (200 mg total) by mouth daily. 30 tablet 0   atorvastatin (LIPITOR) 20 MG tablet Take 1 tablet (20 mg total) by mouth daily. (Patient taking differently: Take 20 mg by mouth at bedtime.) 30 tablet 0   calcitRIOL (ROCALTROL) 0.5 MCG capsule Take 1 capsule (0.5 mcg total) by mouth daily. 0.25 mcg; amt: 1 tab; oral Once A Day Every Other Day  0.5 mcg; oral,Once A Day Every Other Day (Patient taking differently: Take 0.25-0.5 mcg by mouth See admin instructions. 0.25 mcg; amt: 1 tab; oral Once A Day Every Other Day  0.5 mcg; oral,Once A Day Every Other Day) 45 capsule 0   Cholecalciferol (VITAMIN D3) 50 MCG (2000 UT) capsule Take 2,000 Units by mouth daily.     citalopram (CELEXA) 20 MG tablet Take 1 tablet (20 mg total) by mouth daily. 30 tablet 0   Cyanocobalamin (VITAMIN B-12 PO) Take 2,500-5,000 mcg by mouth every other day. 2500 mcg     diclofenac Sodium (VOLTAREN) 1 % GEL Apply 2 g topically daily  as needed (Pain). 2 mg; topical Special Instructions: apply to bilateral knees for pain management     docusate sodium (COLACE) 100 MG capsule Take 1 capsule (100 mg total) by mouth 2 (two) times daily. 10 capsule 0   Flaxseed, Linseed, (FLAX SEED OIL) 1000 MG CAPS Take 1,000 mg by mouth daily.     flecainide (TAMBOCOR) 100 MG tablet Take 1 tablet (100 mg total) by mouth 2 (two) times daily. 60 tablet 0   furosemide (LASIX) 80 MG  tablet Take 2 tablets (160 mg total) by mouth daily. (Patient taking differently: Take 80-160 mg by mouth See admin instructions. Take 160 mg in the morning and 80 mg at noon) 60 tablet 0   gabapentin (NEURONTIN) 100 MG capsule TAKE 1 CAPSULE (100 MG TOTAL) BY MOUTH THREE TIMES DAILY. 90 capsule 5   glipiZIDE (GLUCOTROL XL) 10 MG 24 hr tablet Take 10 mg by mouth daily with breakfast.     levothyroxine (SYNTHROID) 75 MCG tablet Take 1 tablet (75 mcg total) by mouth daily before breakfast. 30 tablet 0   potassium chloride SA (KLOR-CON M) 20 MEQ tablet Take 20 mEq by mouth 2 (two) times a week.     warfarin (COUMADIN) 6 MG tablet Take 1 tablet (6 mg total) by mouth at bedtime. (Patient taking differently: Take 2.5-5 mg by mouth See admin instructions. Monday Wednesday Friday and Saturday 5mg  daily Tuesday Thursday Sunday 2.5 mg) 30 tablet 0   No current facility-administered medications for this encounter.    Physical Exam: BP (!) 150/68   Pulse 74   Ht 5\' 5"  (1.651 m)   Wt 74.7 kg   BMI 27.39 kg/m   GEN: Well nourished, well developed in no acute distress NECK: No JVD; No carotid bruits CARDIAC: Irregularly irregular rate and rhythm, no murmurs, rubs, gallops RESPIRATORY:  Clear to auscultation without rales, wheezing or rhonchi  ABDOMEN: Soft, non-tender, non-distended EXTREMITIES:  non pitting pedal edema, No deformity   Wt Readings from Last 3 Encounters:  07/23/23 74.7 kg  02/11/23 70.3 kg  02/04/23 65.8 kg     EKG today demonstrates  Intermittent V pacing with underlying afib Vent. rate 74 BPM PR interval * ms QRS duration 132 ms QT/QTcB 466/517 ms   Echo 02/05/12 demonstrated  - Left ventricle: The cavity size was normal. Wall thickness    was normal. Systolic function was normal. The estimated    ejection fraction was in the range of 55% to 60%. Wall    motion was normal; there were no regional wall motion    abnormalities.  - Aortic valve: Mild regurgitation.  -  Mitral valve: Calcified annulus. Mildly thickened leaflets    . Mild regurgitation.  - Left atrium: The atrium was mildly dilated.  - Pulmonary arteries: Systolic pressure was mildly to    moderately increased.  Impressions:   - There appears to be a density in right atrium; ?catheter.    CHA2DS2-VASc Score = 7  The patient's score is based upon: CHF History: 0 HTN History: 1 Diabetes History: 1 Stroke History: 2 Vascular Disease History: 0 Age Score: 2 Gender Score: 1       ASSESSMENT AND PLAN: Persistent Atrial Fibrillation/atrial flutter The patient's CHA2DS2-VASc score is 7, indicating a 11.2% annual risk of stroke.   Device clinic received an alert for ongoing atrial flutter and afib starting 04/29/23. She remains in symptomatic afib today.  We discussed rhythm control options today. After discussing the risks and benefits, will  plan for DCCV. Will need 4 therapeutic weekly INR levels. Her INR last week was 1.9 per her report.  Continue flecainide 100 mg BID Will start Toprol 25 mg daily Continue warfarin  Secondary Hypercoagulable State (ICD10:  D68.69) The patient is at significant risk for stroke/thromboembolism based upon her CHA2DS2-VASc Score of 7.  Continue Warfarin (Coumadin). No bleeding issues.  High Risk Medication Monitoring (ICD 10: Z79.899) Intervals on ECG acceptable for flecainide monitoring.      HTN Stable on current regimen  Symptomatic bradycardia S/p PPM, followed by Dr Ladona Ridgel and the device clinic   Follow up in the AF clinic post DCCV.   Informed Consent   Shared Decision Making/Informed Consent The risks (stroke, cardiac arrhythmias rarely resulting in the need for a temporary or permanent pacemaker, skin irritation or burns and complications associated with conscious sedation including aspiration, arrhythmia, respiratory failure and death), benefits (restoration of normal sinus rhythm) and alternatives of a direct current cardioversion were  explained in detail to Ms. Adolf and she agrees to proceed.         Jorja Loa PA-C Afib Clinic [facility] [address] [place], [place] [address] [phone]

## 2023-07-26 DIAGNOSIS — H26493 Other secondary cataract, bilateral: Secondary | ICD-10-CM | POA: Diagnosis not present

## 2023-07-26 DIAGNOSIS — H01002 Unspecified blepharitis right lower eyelid: Secondary | ICD-10-CM | POA: Diagnosis not present

## 2023-07-26 DIAGNOSIS — H01004 Unspecified blepharitis left upper eyelid: Secondary | ICD-10-CM | POA: Diagnosis not present

## 2023-07-26 DIAGNOSIS — H01001 Unspecified blepharitis right upper eyelid: Secondary | ICD-10-CM | POA: Diagnosis not present

## 2023-07-29 DIAGNOSIS — I4821 Permanent atrial fibrillation: Secondary | ICD-10-CM | POA: Diagnosis not present

## 2023-08-04 ENCOUNTER — Telehealth: Payer: Self-pay | Admitting: Orthopedic Surgery

## 2023-08-04 NOTE — Telephone Encounter (Signed)
 Tried to return the pt's call, mb is full

## 2023-08-05 ENCOUNTER — Ambulatory Visit: Attending: Internal Medicine

## 2023-08-11 ENCOUNTER — Ambulatory Visit: Attending: Internal Medicine

## 2023-08-11 DIAGNOSIS — I480 Paroxysmal atrial fibrillation: Secondary | ICD-10-CM

## 2023-08-11 LAB — CUP PACEART INCLINIC DEVICE CHECK
Date Time Interrogation Session: 20250319205753
Implantable Lead Connection Status: 753985
Implantable Lead Connection Status: 753985
Implantable Lead Implant Date: 20130913
Implantable Lead Implant Date: 20130913
Implantable Lead Location: 753859
Implantable Lead Location: 753860
Implantable Lead Model: 1948
Implantable Pulse Generator Implant Date: 20240212
Pulse Gen Model: 2272
Pulse Gen Serial Number: 8153935

## 2023-08-11 NOTE — Progress Notes (Signed)
 Remote pacemaker transmission.

## 2023-08-11 NOTE — Progress Notes (Signed)
 Patient seen in device clinic for programming changes. Auto mode switch programmed from DDIR to DDI. Reviewed with Roxy Cedar, St. Jude and agreeable to changes.

## 2023-08-13 ENCOUNTER — Encounter (HOSPITAL_COMMUNITY): Payer: Self-pay

## 2023-08-23 DIAGNOSIS — I4821 Permanent atrial fibrillation: Secondary | ICD-10-CM | POA: Diagnosis not present

## 2023-09-02 ENCOUNTER — Other Ambulatory Visit (HOSPITAL_COMMUNITY): Payer: Self-pay | Admitting: Physician Assistant

## 2023-09-13 DIAGNOSIS — H26492 Other secondary cataract, left eye: Secondary | ICD-10-CM | POA: Diagnosis not present

## 2023-09-14 ENCOUNTER — Other Ambulatory Visit (HOSPITAL_COMMUNITY): Payer: Self-pay | Admitting: *Deleted

## 2023-09-14 DIAGNOSIS — I4819 Other persistent atrial fibrillation: Secondary | ICD-10-CM

## 2023-09-22 ENCOUNTER — Ambulatory Visit (HOSPITAL_COMMUNITY)
Admission: RE | Admit: 2023-09-22 | Discharge: 2023-09-22 | Disposition: A | Discharge status: Home / Self Care | Source: Ambulatory Visit | Attending: Physician Assistant | Admitting: Physician Assistant

## 2023-09-22 ENCOUNTER — Encounter (HOSPITAL_COMMUNITY): Payer: Self-pay | Admitting: Physician Assistant

## 2023-09-22 VITALS — BP 140/88 | HR 60 | Ht 65.0 in | Wt 158.2 lb

## 2023-09-22 DIAGNOSIS — Z5181 Encounter for therapeutic drug level monitoring: Secondary | ICD-10-CM | POA: Diagnosis not present

## 2023-09-22 DIAGNOSIS — D6869 Other thrombophilia: Secondary | ICD-10-CM

## 2023-09-22 DIAGNOSIS — Z79899 Other long term (current) drug therapy: Secondary | ICD-10-CM

## 2023-09-22 DIAGNOSIS — I4819 Other persistent atrial fibrillation: Secondary | ICD-10-CM | POA: Diagnosis not present

## 2023-09-22 NOTE — Progress Notes (Signed)
 Primary Care Physician: Omie Bickers, MD Primary Cardiologist: None Electrophysiologist: Manya Sells, MD  Referring Physician: Device clinic   [name] is a 83 y.o. female with a history of HTN, CKD, CVA, HLD, symptomatic bradycardia s/p PPM, DM, atrial flutter, atrial fibrillation who presents for follow up in the [facility]rial [facility]ibr[facility].  The patient has been maintained on flecainide  for rhythm control and warfarin for stroke prevention.   The device clinic received an alert for a high atrial flutter burden on 06/30/23. This had been ongoing since December. AF undersensing also suspected. She had symptoms of fatigue, palpitations, SOB, and intermittent dizziness.   Patient returns for follow up for atrial fibrillation. Patient has spontaneously converted to SR. Her symptoms have improved except for the dizziness. She plans to discuss this with her PCP. No bleeding issues on anticoagulation.   Today, she  denies symptoms of palpitations, chest pain, shortness of breath, orthopnea, PND, lower extremity edema, presyncope, syncope, snoring, daytime somnolence, bleeding, or neurologic sequela. The patient is tolerating medications without difficulties and is otherwise without complaint today.    Atrial Fibrillation Risk Factors:  she does not have symptoms or diagnosis of sleep apnea. she does not have a history of rheumatic fever.   Atrial Fibrillation Management history:  Previous antiarrhythmic drugs: flecainide   Previous cardioversions: none Previous ablations: none Anticoagulation history: warfarin   ROS- All systems are reviewed and negative except as per the HPI above.  Past Medical History:  Diagnosis Date   A-fib [facility])    Arthritis    spinal   Chronic kidney disease    Colon polyps    CVA (cerebral vascular accident) (HCC)    left sided weakness. Has 40% weakness of left arm   Depression    DM (diabetes mellitus) (HCC)    GERD  (gastroesophageal reflux disease)    HTN (hypertension)    Hypercholesterolemia    Hypothyroidism    Pancreatitis    biliary   S/P colonoscopy 07/2004   Dr. Grandville Lax: per e-chart report: diverticulosis, path adenomatous polyps   S/P endoscopy 07/2007   multiple 3-4 mmsessile gastric polyps, benign, singl antral erosions, negative H.pylori, reactive gastropathy ? 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 6.7 g 0   allopurinol  (ZYLOPRIM ) 100 MG tablet Take 2 tablets (200 mg total) by mouth daily. 30 tablet 0   atorvastatin  (LIPITOR) 20 MG tablet Take 1 tablet (20 mg total) by mouth daily. (Patient taking differently: Take 20 mg by mouth at bedtime.) 30 tablet 0   calcitRIOL  (ROCALTROL ) 0.25 MCG capsule Take 0.25 mcg by mouth every other day. Alternating with 0.5 mcg     calcitRIOL  (ROCALTROL ) 0.5 MCG capsule Take 1 capsule (0.5 mcg total) by mouth daily. 0.25 mcg; amt: 1 tab; oral Once A Day Every Other Day  0.5 mcg; oral,Once A Day Every Other Day (Patient taking differently: Take 0.25-0.5 mcg by mouth See admin instructions. 0.25 mcg; amt: 1 tab; oral Once A Day Every Other Day  0.5 mcg; oral,Once A Day Every Other Day) 45 capsule 0   Cholecalciferol (VITAMIN D3) 50 MCG (2000 UT) capsule Take 2,000 Units by mouth daily.     citalopram  (CELEXA ) 20 MG  tablet Take 1 tablet (20 mg total) by mouth daily. 30 tablet 0   Cyanocobalamin  (VITAMIN B-12 PO) Take 2,500-5,000 mcg by mouth every other day.     diclofenac Sodium (VOLTAREN) 1 % GEL Apply 2 g topically daily as needed (Pain). 2 mg; topical Special Instructions: apply to bilateral knees for pain management     Flaxseed, Linseed, (FLAX SEED OIL) 1000 MG CAPS Take 1,000 mg by mouth daily.      flecainide  (TAMBOCOR ) 100 MG tablet Take 1 tablet (100 mg total) by mouth 2 (two) times daily. 60 tablet 0   furosemide  (LASIX ) 80 MG tablet Take 2 tablets (160 mg total) by mouth daily. (Patient taking differently: Take 80-160 mg by mouth See admin instructions. 160 mg every morning, an additional 80 mg at NOON if needed for swelling) 60 tablet 0   gabapentin  (NEURONTIN ) 100 MG capsule TAKE 1 CAPSULE (100 MG TOTAL) BY MOUTH THREE TIMES DAILY. 90 capsule 5   glipiZIDE (GLUCOTROL XL) 10 MG 24 hr tablet Take 10 mg by mouth daily with breakfast.     levothyroxine  (SYNTHROID ) 75 MCG tablet Take 1 tablet (75 mcg total) by mouth daily before breakfast. 30 tablet 0   metoprolol  succinate (TOPROL -XL) 25 MG 24 hr tablet TAKE 1 TABLET (25 MG TOTAL) BY MOUTH DAILY. 90 tablet 1   potassium chloride  SA (KLOR-CON  M) 20 MEQ tablet Take 20 mEq by mouth 2 (two) times a week.     warfarin (COUMADIN ) 5 MG tablet Take 2.5-5 mg by mouth See admin instructions. Monday Wednesday Friday and Saturday 5mg  daily Tuesday Thursday Sunday 2.5 mg     No current facility-administered medications for this encounter.    Physical Exam: BP (!) 140/88   Pulse 60   Ht 5\' 5"  (1.651 m)   Wt 71.8 kg   BMI 26.33 kg/m   GEN: Well nourished, well developed in no acute distress NECK: No JVD; No carotid bruits CARDIAC: Regular rate and rhythm, no murmurs, rubs, gallops RESPIRATORY:  Clear to auscultation without rales, wheezing or rhonchi  ABDOMEN: Soft, non-tender, non-distended EXTREMITIES:  No edema; No deformity    Wt Readings from Last 3 Encounters:  09/22/23 71.8 kg  07/23/23 74.7 kg  02/11/23 70.3 kg     EKG today demonstrates  A paced rhythm, ST changes inferior and lateral similar to previous Vent. rate 60 BPM PR interval 352 ms QRS duration 120 ms QT/QTcB 480/480 ms   Echo 02/05/12 demonstrated  - Left ventricle: The cavity size was normal. Wall thickness    was normal. Systolic function was normal. The  estimated    ejection fraction was in the range of 55% to 60%. Wall    motion was normal; there were no regional wall motion    abnormalities.  - Aortic valve: Mild regurgitation.  - Mitral valve: Calcified annulus. Mildly thickened leaflets    . Mild regurgitation.  - Left atrium: The atrium was mildly dilated.  - Pulmonary arteries: Systolic pressure was mildly to    moderately increased.  Impressions:   - There appears to be a density in right atrium; ?catheter.    CHA2DS2-VASc Score = 7  The patient's score is based upon: CHF History: 0 HTN History: 1 Diabetes History: 1 Stroke History: 2 Vascular Disease History: 0 Age Score: 2 Gender Score: 1       ASSESSMENT AND PLAN: Persistent Atrial Fibrillation (ICD10:  I48.19) The patient's CHA2DS2-VASc score is 7, indicating a 11.2% annual risk of stroke.  Patient spontaneously converted to SR, will cancel DCCV.  Continue flecainide  100 mg BID Continue Toprol  25 mg daily Continue warfarin  Secondary Hypercoagulable State (ICD10:  D68.69) The patient is at significant risk for stroke/thromboembolism based upon her CHA2DS2-VASc Score of 7.  Continue Warfarin (Coumadin ). No bleeding issues.   High Risk Medication Monitoring (ICD 10: Z79.899) Intervals on ECG acceptable for flecainide  monitoring.      HTN Stable on current regimen  Symptomatic bradycardia S/p PPM, followed by Dr Carolynne Citron    Follow up with Dr Carolynne Citron as scheduled.        Myrtha Ates PA-C Afib [facility] [address] [place], [place] [address] [phone]

## 2023-09-23 DIAGNOSIS — I4821 Permanent atrial fibrillation: Secondary | ICD-10-CM | POA: Diagnosis not present

## 2023-09-24 ENCOUNTER — Encounter (HOSPITAL_COMMUNITY): Admission: RE | Payer: Self-pay | Source: Home / Self Care

## 2023-09-24 ENCOUNTER — Ambulatory Visit (HOSPITAL_COMMUNITY): Admission: RE | Admit: 2023-09-24 | Source: Home / Self Care | Admitting: Internal Medicine

## 2023-09-24 SURGERY — CARDIOVERSION (CATH LAB)
Anesthesia: General

## 2023-09-27 ENCOUNTER — Ambulatory Visit: Admitting: Orthopedic Surgery

## 2023-09-27 ENCOUNTER — Encounter: Payer: Self-pay | Admitting: Orthopedic Surgery

## 2023-09-27 VITALS — BP 137/78 | HR 83 | Ht 65.0 in | Wt 158.0 lb

## 2023-09-27 DIAGNOSIS — M4802 Spinal stenosis, cervical region: Secondary | ICD-10-CM | POA: Diagnosis not present

## 2023-09-27 DIAGNOSIS — M542 Cervicalgia: Secondary | ICD-10-CM

## 2023-09-27 NOTE — Patient Instructions (Signed)
 Physical therapy has been ordered for you at [facility]. They should call you to schedule, [phone] is the phone number to call, if you want to call to schedule.

## 2023-09-27 NOTE — Progress Notes (Signed)
  Intake history:  There were no vitals taken for this visit. There is no height or weight on file to calculate BMI.    WHAT ARE WE SEEING YOU FOR TODAY?   Neck pain  How long has this bothered you? (DOI?DOS?WS?)  For several months has gotten worse, started about the same time she hurt shoulder in Sept 2024   Anticoag.  Yes   Diabetes Yes  Heart disease Yes/ Pacemaker   Hypertension Yes  SMOKING HX No  Kidney disease Yes     Latest Ref Rng & Units 07/09/2023   12:00 AM 01/26/2023    6:21 PM 06/26/2022    3:08 PM  CMP  Glucose 70 - 99 mg/dL  161  096   BUN 8 - 23 mg/dL  37  34   Creatinine 0.45 - 1.00 mg/dL  4.09  8.11   Sodium 914 - 145 mmol/L  139  139   Potassium 3.5 - 5.1 mmol/L  3.7  3.6   Chloride 98 - 111 mmol/L  97  97   CO2 22 - 32 mmol/L  30  30   Calcium   9.4     9.2  9.1   Total Protein 6.5 - 8.1 g/dL  7.2    Total Bilirubin 0.3 - 1.2 mg/dL  0.5    Alkaline Phos 38 - 126 U/L  77    AST 15 - 41 U/L  27    ALT 0 - 44 U/L  17       This result is from an external source.     Any ALLERGIES ____________________ Allergies  Allergen Reactions   Fiorinal [Butalbital-Aspirin -Caffeine] Hives   Penicillins Rash and Other (See Comments)   Tetanus Toxoids Rash   __________________________   Treatment:  Have you taken:  Tylenol  Yes  Advil No  Had PT No  Had injection No  Other  _________________________

## 2023-09-27 NOTE — Progress Notes (Signed)
  Intake history:  There were no vitals taken for this visit. There is no height or weight on file to calculate BMI.  Chief Complaint  Patient presents with   Neck Pain    For several months/ can't raise head very well     She is 82 she comes in with neck pain  Her complaints include 1 I cannot lift my head up 2 I am having trouble walking 3 I am having trouble with balance 4 I am having neck pain 5 my neck is stiff  She is on warfarin therapy she can come off of that pain has been present for several months started after she fractured her shoulder with a proximal humerus fracture  Pain is getting worse despite being on Tylenol  which is all she can really take she also takes some gabapentin  which we used for her shoulder when it was hurting when she fractured it  Diabetic, hypertension, heart disease with pacemaker kidney disease  WHAT ARE WE SEEING YOU FOR TODAY?   Neck pain  How long has this bothered you? (DOI?DOS?WS?)  Any ALLERGIES ____________________ Allergies  Allergen Reactions   Fiorinal [Butalbital-Aspirin -Caffeine] Hives   Penicillins Rash and Other (See Comments)   Tetanus Toxoids Rash   __________________________    Reatha Campanile was done in 2024X-rays show degenerative changes in the cervical spine spondylosis disc space narrowing   Impression cervical spondylosis no acute fracture   We also C4 on 5 subluxation 2 to 3 mm chronic   CT scan 2019 Disc levels: Facet degenerative changes throughout the cervical spine. Moderate disc space narrowing and mild to moderate anterior and moderate posterior spur formation at the C5-6 level. Lesser degenerative changes at the C4-5 and C6-7 levels.   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

## 2023-09-30 DIAGNOSIS — H26491 Other secondary cataract, right eye: Secondary | ICD-10-CM | POA: Diagnosis not present

## 2023-10-05 ENCOUNTER — Telehealth (HOSPITAL_COMMUNITY): Payer: Self-pay | Admitting: *Deleted

## 2023-10-05 NOTE — Telephone Encounter (Signed)
 Patient called in stating since yesterday evening she has been significant shortness of breath and weakness - inquiring if she is in afib as the cause. Pt does endorse orthopnea last evening denies abdominal bloating or cough. Myrtha Ates PA reviewed pacemaker transmission confirmed pt does not appear to be in afib. Rates controlled in the 70s. BP 146/61. Recommends taking an extra 80mg  of lasix  now and call with update of symptoms tomorrow. ER precautions reviewed with patient and she verbalized agreement.

## 2023-10-06 ENCOUNTER — Ambulatory Visit (INDEPENDENT_AMBULATORY_CARE_PROVIDER_SITE_OTHER): Payer: Medicare HMO

## 2023-10-06 DIAGNOSIS — I495 Sick sinus syndrome: Secondary | ICD-10-CM

## 2023-10-06 LAB — CUP PACEART REMOTE DEVICE CHECK
Battery Remaining Longevity: 94 mo
Battery Remaining Percentage: 90 %
Battery Voltage: 2.99 V
Brady Statistic AP VP Percent: 40 %
Brady Statistic AP VS Percent: 9.6 %
Brady Statistic AS VP Percent: 41 %
Brady Statistic AS VS Percent: 8.8 %
Brady Statistic RA Percent Paced: 44 %
Brady Statistic RV Percent Paced: 80 %
Date Time Interrogation Session: 20250513163711
Implantable Lead Connection Status: 753985
Implantable Lead Connection Status: 753985
Implantable Lead Implant Date: 20130913
Implantable Lead Implant Date: 20130913
Implantable Lead Location: 753859
Implantable Lead Location: 753860
Implantable Lead Model: 1948
Implantable Pulse Generator Implant Date: 20240212
Lead Channel Impedance Value: 490 Ohm
Lead Channel Impedance Value: 560 Ohm
Lead Channel Pacing Threshold Amplitude: 0.75 V
Lead Channel Pacing Threshold Amplitude: 1.25 V
Lead Channel Pacing Threshold Pulse Width: 0.5 ms
Lead Channel Pacing Threshold Pulse Width: 0.5 ms
Lead Channel Sensing Intrinsic Amplitude: 12 mV
Lead Channel Sensing Intrinsic Amplitude: 2.4 mV
Lead Channel Setting Pacing Amplitude: 2 V
Lead Channel Setting Pacing Amplitude: 2.5 V
Lead Channel Setting Pacing Pulse Width: 0.5 ms
Lead Channel Setting Sensing Sensitivity: 2 mV
Pulse Gen Model: 2272
Pulse Gen Serial Number: 8153935

## 2023-10-07 ENCOUNTER — Telehealth: Payer: Self-pay

## 2023-10-07 NOTE — Telephone Encounter (Signed)
 Transmission reviewed.  Scheduled transmission.  Will await review and advisement from Dr. Carolynne Citron.  Pt has scheduled follow up next month.  Await further needs.

## 2023-10-07 NOTE — Telephone Encounter (Addendum)
 Called patient x 2. Unable to LM. VM full.

## 2023-10-07 NOTE — Telephone Encounter (Signed)
 PPM Scheduled remote reviewed. Normal device function.  Presenting rhythm:  AP/AS-VP.  805 AHR detections, AF burden <1%, longest 1 hr 49 min, EGMs consistent with AT/AFL.  Decrease in AF from previous.  Overall increase in RV pacing from previous, now ranging from 30-80%. Routing to triage for increased RV pacing per protocol.  Next remote 91 days. - CS, CVRS  Per Trinity Hammans, RN note yesterday pt is symptomatic likely d/t increase in VP. Implanted for SND. Going to call and see if patient can come in to assess for needs in changes to AVD's

## 2023-10-11 ENCOUNTER — Ambulatory Visit: Payer: Self-pay | Admitting: Internal Medicine

## 2023-10-13 ENCOUNTER — Telehealth: Payer: Self-pay | Admitting: Orthopedic Surgery

## 2023-10-13 NOTE — Telephone Encounter (Signed)
 He gave her Gabapentin  I called hertlo tell her he is not in office today I will send him the message but would like more information Left message for her to call me back.

## 2023-10-13 NOTE — Telephone Encounter (Signed)
 Dr. Delfino Fellers pt - spoke w/the pt, she is wanting to speak directly to Dr. Phyllis Breeze regarding some medications he prescribed.  [phone]

## 2023-10-14 DIAGNOSIS — H524 Presbyopia: Secondary | ICD-10-CM | POA: Diagnosis not present

## 2023-10-19 ENCOUNTER — Telehealth: Payer: Self-pay | Admitting: Orthopedic Surgery

## 2023-10-19 ENCOUNTER — Telehealth: Payer: Self-pay | Admitting: Internal Medicine

## 2023-10-19 NOTE — Telephone Encounter (Signed)
 Dr. Delfino Fellers pt - pt wants to speak with Amy. [phone]

## 2023-10-19 NOTE — Telephone Encounter (Signed)
 Pt c/o Shortness Of Breath: STAT if SOB developed within the last 24 hours or pt is noticeably SOB on the phone  1. Are you currently SOB (can you hear that pt is SOB on the phone)? No   2. How long have you been experiencing SOB? Since cancelled cardioversion on 05/02  3. Are you SOB when sitting or when up moving around? Both   4. Are you currently experiencing any other symptoms? Nausea off and on   States it is mainly at night when laying down. Please advise.

## 2023-10-19 NOTE — Telephone Encounter (Signed)
 This is a patient of the A-fib clinic and has been advised regarding her SOB since 10/05/23  It does not appear she has made follow up call regarding the lasix  recommendation given.   Attempt to reach, goes to voicemail. I asked patient to return call to A-fib clinic and gave her general cardiology office number in [place] too.

## 2023-10-19 NOTE — Telephone Encounter (Signed)
 Per device clinic last phone note recommended that pt is symptomatic likely d/t increase in VP. Implanted for SND. Going to call and see if patient can come in to assess for needs in changes to AVD's  Device clinic unable to reach pt at the time will forward to their office.

## 2023-10-20 NOTE — Telephone Encounter (Signed)
 Left message for her to call back

## 2023-10-20 NOTE — Telephone Encounter (Signed)
 Dr. Delfino Fellers pt - spoke w/the pt, she stated she is trying to eliminate whatever is making her dizzy.  She wants to know what Dr. Marvina Slough thinks about her stopping Gabapentin , bc it makes her dizzy, she has already tapered off of it.  She also stated she is taking Hydrocodone  5-325 and it's not enough for the pain, wants something stronger for the pain.  She uses [facility].  [phone]

## 2023-10-20 NOTE — Telephone Encounter (Signed)
 Left another message for her Will you please find out what she needs if she calls back? Easier than phone tag, I'm way behind with calls today

## 2023-10-20 NOTE — Telephone Encounter (Signed)
 Spoke w/the pt, she stated she's sorry she missed your call, her phone has been ringing off the hook.  She'd like you to call her back when you have time.

## 2023-10-21 DIAGNOSIS — I4821 Permanent atrial fibrillation: Secondary | ICD-10-CM | POA: Diagnosis not present

## 2023-10-21 NOTE — Telephone Encounter (Signed)
 Thanks I called patient to advise Left message for her to call me back.

## 2023-10-22 NOTE — Telephone Encounter (Signed)
 Msg sent to schedulers to schedule pt with general cardiology.

## 2023-10-22 NOTE — Telephone Encounter (Signed)
 I called her we discussed She states currently she is not interested in pain management She is going to try the Gabapentin  only at bedtime and call back to let me know if she is interested in pain management at a later date.

## 2023-10-25 ENCOUNTER — Ambulatory Visit: Admitting: Cardiovascular Disease

## 2023-10-25 NOTE — Telephone Encounter (Signed)
  Pt said, afib clinic called and recommended to double up her lasix  and she is feeling better, she said, she will see GT on 06/26 instead. She cancelled the appt with Dr. Stann Earnest today

## 2023-10-25 NOTE — Progress Notes (Deleted)
 CARDIOLOGY CONSULT NOTE       Patient ID: [name] MRN: [id number] DOB/AGE: 1941/03/03 83 y.o.  Admit date: (Not on file) Referring Physician: Del Favia Primary Physician: Omie Bickers, MD Primary Cardiologist: Carolynne Citron Reason for Consultation: Dyspnea  Active Problems:   * No active hospital problems. *   HPI:  83 y.o. seen at request of Dr Del Favia for dyspnea. History of HTN, CKD, CVA, HLD , PPM for bradycardia, and PAF. She is on flecainide  and coumadin . PACEART 06/30/23 showed high burden of flutter. Since December She has some dizziness , and palpitations. CHADVASC 7 for HTN, DM, stroke and age/gender. Was continued on flecainide  and Toprol  25 mg.   ***  ROS All other systems reviewed and negative except as noted above  Past Medical History:  Diagnosis Date   A-fib [facility])    Arthritis    spinal   Chronic kidney disease    Colon polyps    CVA (cerebral vascular accident) (HCC)    left sided weakness. Has 40% weakness of left arm   Depression    DM (diabetes mellitus) (HCC)    GERD (gastroesophageal reflux disease)    HTN (hypertension)    Hypercholesterolemia    Hypothyroidism    Pancreatitis    biliary   S/P colonoscopy 07/2004   Dr. Grandville Lax: per e-chart report: diverticulosis, path adenomatous polyps   S/P endoscopy 07/2007   multiple 3-4 mmsessile gastric polyps, benign, singl antral erosions, negative H.pylori, reactive gastropathy ? 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    Family History  Problem Relation Age of Onset   Heart disease Other    Arthritis Other    Lung disease Other    Diabetes Other    Kidney disease Other    Cancer Mother    Heart disease Father    Colon cancer Neg Hx     Social History   Socioeconomic History   Marital status: Legally Separated    Spouse name: Not on file   Number of children: Not on file   Years of education: Not on file   Highest education level: Not on file  Occupational  History   Not on file  Tobacco Use   Smoking status: Never   Smokeless tobacco: Never   Tobacco comments:    Never smoked 09/22/23  Vaping Use   Vaping status: Never Used  Substance and Sexual Activity   Alcohol use: No   Drug use: No   Sexual activity: Not Currently  Other Topics Concern   Not on file  Social History Narrative   Not on file   Social Drivers of Health   Financial Resource Strain: Not on file  Food Insecurity: Not on file  Transportation Needs: Not on file  Physical Activity: Not on file  Stress: Not on file  Social Connections: Not on file  Intimate Partner Violence: Not on file    Past Surgical History:  Procedure Laterality Date   CATARACT EXTRACTION W/PHACO Left 04/07/2021   Procedure: CATARACT EXTRACTION PHACO AND INTRAOCULAR LENS PLACEMENT LEFT EYE;  Surgeon: Tarri Farm, MD;  Location: [facility];  Service: Ophthalmology;  Laterality: Left;  left CDE=11.11   CATARACT EXTRACTION W/PHACO Right 04/21/2021   Procedure: CATARACT EXTRACTION PHACO AND INTRAOCULAR LENS PLACEMENT RIGHT EYE;  Surgeon: Tarri Farm, MD;  Location: [facility];  Service: Ophthalmology;  Laterality: Right;  right CDE=10.12   CHOLECYSTECTOMY     COLONOSCOPY  03/10/2011  Procedure: COLONOSCOPY;  Surgeon: Suzette Espy, MD;  Location: [facility];  Service: Endoscopy;  Laterality: N/A;  12:45   COLONOSCOPY WITH PROPOFOL  N/A 07/22/2020   Procedure: COLONOSCOPY WITH PROPOFOL ;  Surgeon: Suzette Espy, MD;  Location[facility];  Service: Endoscopy;  Laterality: N/A;  10:30am   HIP PINNING,CANNULATED Right 09/20/2021   Procedure: CANNULATED HIP PINNING;  Surgeon: Darrin Emerald, MD;  Location: [facility];  Service: Orthopedics;  Laterality: Right;   PACEMAKER INSERTION  02/05/2012   SJM Accent DR RF implanted by Dr Nunzio Belch for symptomatic bradycardia   PERMANENT PACEMAKER INSERTION N/A 02/05/2012   Procedure: PERMANENT PACEMAKER INSERTION;  Surgeon: Jolly Needle, MD;  Location: [facility]  CATH LAB;  Service: Cardiovascular;  Laterality: N/A;   POLYPECTOMY  07/22/2020   Procedure: POLYPECTOMY;  Surgeon: Suzette Espy, MD;  [facility];  Service: Endoscopy;;   PPM GENERATOR CHANGEOUT N/A 07/06/2022   Procedure: PPM GENERATOR CHANGEOUT;  Surgeon: Tammie Fall, MD;  Location: [facility] INVASIVE CV LAB;  Service: Cardiovascular;  Laterality: N/A;   SHOULDER SURGERY  2008   left shoulder replacement due to fracture    SKIN CANCER EXCISION Left    Skin cancer on left side of face.      Current Outpatient Medications:    albuterol  (VENTOLIN  HFA) 108 (90 Base) MCG/ACT inhaler, Inhale 2 puffs into the lungs every 6 (six) hours as needed for wheezing or shortness of breath., Disp: 6.7 g, Rfl: 0   allopurinol  (ZYLOPRIM ) 100 MG tablet, Take 2 tablets (200 mg total) by mouth daily., Disp: 30 tablet, Rfl: 0   atorvastatin  (LIPITOR) 20 MG tablet, Take 1 tablet (20 mg total) by mouth daily. (Patient taking differently: Take 20 mg by mouth at bedtime.), Disp: 30 tablet, Rfl: 0   calcitRIOL  (ROCALTROL ) 0.25 MCG capsule, Take 0.25 mcg by mouth every other day. Alternating with 0.5 mcg, Disp: , Rfl:    calcitRIOL  (ROCALTROL ) 0.5 MCG capsule, Take 1 capsule (0.5 mcg total) by mouth daily. 0.25 mcg; amt: 1 tab; oral Once A Day Every Other Day  0.5 mcg; oral,Once A Day Every Other Day (Patient taking differently: Take 0.25-0.5 mcg by mouth See admin instructions. 0.25 mcg; amt: 1 tab; oral Once A Day Every Other Day  0.5 mcg; oral,Once A Day Every Other Day), Disp: 45 capsule, Rfl: 0   Cholecalciferol (VITAMIN D3) 50 MCG (2000 UT) capsule, Take 2,000 Units by mouth daily., Disp: , Rfl:    citalopram  (CELEXA ) 20 MG tablet, Take 1 tablet (20 mg total) by mouth daily., Disp: 30 tablet, Rfl: 0   Cyanocobalamin  (VITAMIN B-12 PO), Take 2,500-5,000 mcg by mouth every other day., Disp: , Rfl:    diclofenac Sodium (VOLTAREN) 1 % GEL, Apply 2 g topically daily as needed (Pain). 2 mg; topical Special  Instructions: apply to bilateral knees for pain management, Disp: , Rfl:    Flaxseed, Linseed, (FLAX SEED OIL) 1000 MG CAPS, Take 1,000 mg by mouth daily., Disp: , Rfl:    flecainide  (TAMBOCOR ) 100 MG tablet, Take 1 tablet (100 mg total) by mouth 2 (two) times daily., Disp: 60 tablet, Rfl: 0   furosemide  (LASIX ) 80 MG tablet, Take 2 tablets (160 mg total) by mouth daily. (Patient taking differently: Take 80-160 mg by mouth See admin instructions. 160 mg every morning, an additional 80 mg at [facility] if needed for swelling), Disp: 60 tablet, Rfl: 0   gabapentin  (NEURONTIN ) 100 MG capsule, TAKE 1 CAPSULE (100 MG  TOTAL) BY MOUTH THREE TIMES DAILY., Disp: 90 capsule, Rfl: 5   glipiZIDE (GLUCOTROL XL) 10 MG 24 hr tablet, Take 10 mg by mouth daily with breakfast., Disp: , Rfl:    levothyroxine  (SYNTHROID ) 75 MCG tablet, Take 1 tablet (75 mcg total) by mouth daily before breakfast., Disp: 30 tablet, Rfl: 0   metoprolol  succinate (TOPROL -XL) 25 MG 24 hr tablet, TAKE 1 TABLET (25 MG TOTAL) BY MOUTH DAILY., Disp: 90 tablet, Rfl: 1   potassium chloride  SA (KLOR-CON  M) 20 MEQ tablet, Take 20 mEq by mouth 2 (two) times a week., Disp: , Rfl:    warfarin (COUMADIN ) 5 MG tablet, Take 2.5-5 mg by mouth See admin instructions. Monday Wednesday Friday and Saturday 5mg  daily Tuesday Thursday Sunday 2.5 mg, Disp: , Rfl:     Physical Exam: There were no vitals taken for this visit.    Affect appropriate Healthy:  appears stated age HEENT: normal Neck supple with no adenopathy JVP normal no bruits no thyromegaly Lungs clear with no wheezing and good diaphragmatic motion Heart:  S1/S2 no murmur, no rub, gallop or click PMI normal  PPM under left clavicle  Abdomen: benighn, BS positve, no tenderness, no AAA no bruit.  No HSM or HJR Distal pulses intact with no bruits No edema Neuro non-focal Skin warm and dry No muscular weakness   Labs:   Lab Results  Component Value Date   WBC 9.7 01/26/2023   HGB 12.8  01/26/2023   HCT 39.6 01/26/2023   MCV 102.1 (H) 01/26/2023   PLT 148 (L) 01/26/2023   No results for input(s): "NA", "K", "CL", "CO2", "BUN", "CREATININE", "CALCIUM ", "PROT", "BILITOT", "ALKPHOS", "ALT", "AST", "GLUCOSE" in the last 168 hours.  Invalid input(s): "LABALBU" Lab Results  Component Value Date   CKTOTAL 266 (H) 08/10/2007   CKMB 1.4 08/10/2007    Lab Results  Component Value Date   CHOL 117 02/05/2012   CHOL  08/08/2007    130        ATP III CLASSIFICATION:  <200     mg/dL   Desirable  161-096  mg/dL   Borderline High  >=045    mg/dL   High   Lab Results  Component Value Date   HDL 47 02/05/2012   HDL 64 08/08/2007   Lab Results  Component Value Date   LDLCALC 41 02/05/2012   LDLCALC  08/08/2007    57        Total Cholesterol/HDL:CHD Risk Coronary Heart Disease Risk Table                     Men   Women  1/2 Average Risk   3.4   3.3   Lab Results  Component Value Date   TRIG 103 04/28/2020   TRIG 147 02/05/2012   TRIG 45 08/08/2007   Lab Results  Component Value Date   CHOLHDL 2.5 02/05/2012   CHOLHDL 2.0 08/08/2007   No results found for: "LDLDIRECT"    Radiology: CUP PACEART REMOTE DEVICE CHECK Result Date: 10/06/2023 PPM Scheduled remote reviewed. Normal device function.  Presenting rhythm:  AP/AS-VP.  805 AHR detections, AF burden <1%, longest 1 hr 49 min, EGMs consistent with AT/AFL.  Decrease in AF from previous.  Overall increase in RV pacing from previous, now ranging from 30-80%. Routing to triage for increased RV pacing per protocol. Next remote 91 days. - CS, CVRS   EKG: A pacing with prolonged PR diffuse T wave inversions normal QRS/QT  ASSESSMENT AND PLAN:   PAF:  CHADVASC 7 on coumadin , Toprol  and flecainide  *** Dyspnea:  seems functional may be related to arrhythmia update TTE *** DM:  on glipizide and glucophage f/u primary for A1c Thyroid :  contnue synthroid  replacement TSH with primaryTSH normal 07/09/23 0.72  HLD:   continue statin labs with primary Abnormal ECG:  chronic T wave inversions ? Related to intermittent pacing *** Dyspnea:  see above r/o ischemia and check EF with TTE  Echo Lexiscan myovue  F/U coumadin  clinic F/U Carolynne Citron EP for PPM/PAF  Signed: Janelle Mediate 10/25/2023, 8:07 AM

## 2023-10-27 ENCOUNTER — Telehealth: Payer: Self-pay | Admitting: Orthopedic Surgery

## 2023-10-27 NOTE — Telephone Encounter (Signed)
 Returned the pt's call, lvm for her to call me back tomorrow.

## 2023-10-28 ENCOUNTER — Telehealth: Payer: Self-pay | Admitting: Radiology

## 2023-10-28 NOTE — Telephone Encounter (Signed)
 Patient called, said she is having such pain without taking the gabapentin .  She says she would like to restart it, and needs to know the instructions for this.  She says she will just have to deal with the dizziness.  Please call her to advise on- she is aware it will be Monday when you all call back.  Thanks.

## 2023-11-01 NOTE — Telephone Encounter (Signed)
 When I spoke to her last week she was going to try the Gabapentin  at bedtime only I need to call her and see  if  she tried that.

## 2023-11-03 NOTE — Telephone Encounter (Signed)
 I called again  She is going to try to use the Gabapentin  at bedtime only and see if that is helpful   She will let me know if its not.

## 2023-11-09 LAB — LAB REPORT - SCANNED
A1c: 7.3
Albumin, Urine POC: 3.1
Creatinine, POC: 55.2 mg/dL
EGFR: 28
Free T4: 1.38 ng/dL
Microalb Creat Ratio: 6
TSH: 1.88 (ref 0.41–5.90)

## 2023-11-12 DIAGNOSIS — E039 Hypothyroidism, unspecified: Secondary | ICD-10-CM | POA: Diagnosis not present

## 2023-11-12 DIAGNOSIS — G609 Hereditary and idiopathic neuropathy, unspecified: Secondary | ICD-10-CM | POA: Diagnosis not present

## 2023-11-12 DIAGNOSIS — N1831 Chronic kidney disease, stage 3a: Secondary | ICD-10-CM | POA: Diagnosis not present

## 2023-11-12 DIAGNOSIS — G894 Chronic pain syndrome: Secondary | ICD-10-CM | POA: Diagnosis not present

## 2023-11-12 DIAGNOSIS — E782 Mixed hyperlipidemia: Secondary | ICD-10-CM | POA: Diagnosis not present

## 2023-11-12 DIAGNOSIS — M4802 Spinal stenosis, cervical region: Secondary | ICD-10-CM | POA: Diagnosis not present

## 2023-11-12 DIAGNOSIS — E1169 Type 2 diabetes mellitus with other specified complication: Secondary | ICD-10-CM | POA: Diagnosis not present

## 2023-11-12 DIAGNOSIS — M8580 Other specified disorders of bone density and structure, unspecified site: Secondary | ICD-10-CM | POA: Diagnosis not present

## 2023-11-12 DIAGNOSIS — F331 Major depressive disorder, recurrent, moderate: Secondary | ICD-10-CM | POA: Diagnosis not present

## 2023-11-12 DIAGNOSIS — I10 Essential (primary) hypertension: Secondary | ICD-10-CM | POA: Diagnosis not present

## 2023-11-12 DIAGNOSIS — I4891 Unspecified atrial fibrillation: Secondary | ICD-10-CM | POA: Diagnosis not present

## 2023-11-12 DIAGNOSIS — M109 Gout, unspecified: Secondary | ICD-10-CM | POA: Diagnosis not present

## 2023-11-16 ENCOUNTER — Ambulatory Visit (HOSPITAL_COMMUNITY)

## 2023-11-16 NOTE — Progress Notes (Signed)
 Remote pacemaker transmission.

## 2023-11-18 ENCOUNTER — Encounter: Payer: Self-pay | Admitting: Internal Medicine

## 2023-11-18 ENCOUNTER — Ambulatory Visit: Payer: Medicare HMO | Attending: Internal Medicine | Admitting: Internal Medicine

## 2023-11-18 VITALS — BP 114/58 | HR 62 | Ht 65.0 in | Wt 144.2 lb

## 2023-11-18 DIAGNOSIS — Z5181 Encounter for therapeutic drug level monitoring: Secondary | ICD-10-CM

## 2023-11-18 DIAGNOSIS — I495 Sick sinus syndrome: Secondary | ICD-10-CM

## 2023-11-18 DIAGNOSIS — I4821 Permanent atrial fibrillation: Secondary | ICD-10-CM | POA: Diagnosis not present

## 2023-11-18 DIAGNOSIS — Z79899 Other long term (current) drug therapy: Secondary | ICD-10-CM | POA: Diagnosis not present

## 2023-11-18 DIAGNOSIS — I4819 Other persistent atrial fibrillation: Secondary | ICD-10-CM | POA: Diagnosis not present

## 2023-11-18 NOTE — Progress Notes (Signed)
 HPI [name] returns for ongoing evaluation and management of atrial fib, sinus node dysfunction, s/p PPM insertion, and HTN. In the interim, she c/o sob. She is still working. No syncope. She reached ERI and underwent PM gen change out 15 months ago. She has felt well in the interim. No palpitations. She notes that her symptoms of fatigue and sob worsened with starting of toprol . This corresponds to an increase in ventricular pacing.  Allergies  Allergen Reactions   Fiorinal [Butalbital-Aspirin -Caffeine] Hives   Penicillins Rash and Other (See Comments)   Tetanus Toxoids Rash     Current Outpatient Medications  Medication Sig Dispense Refill   albuterol  (VENTOLIN  HFA) 108 (90 Base) MCG/ACT inhaler Inhale 2 puffs into the lungs every 6 (six) hours as needed for wheezing or shortness of breath. 6.7 g 0   allopurinol  (ZYLOPRIM ) 100 MG tablet Take 2 tablets (200 mg total) by mouth daily. 30 tablet 0   atorvastatin  (LIPITOR) 20 MG tablet Take 1 tablet (20 mg total) by mouth daily. (Patient taking differently: Take 20 mg by mouth at bedtime.) 30 tablet 0   calcitRIOL  (ROCALTROL ) 0.25 MCG capsule Take 0.25 mcg by mouth every other day. Alternating with 0.5 mcg     calcitRIOL  (ROCALTROL ) 0.5 MCG capsule Take 1 capsule (0.5 mcg total) by mouth daily. 0.25 mcg; amt: 1 tab; oral Once A Day Every Other Day  0.5 mcg; oral,Once A Day Every Other Day (Patient taking differently: Take 0.25-0.5 mcg by mouth See admin instructions. 0.25 mcg; amt: 1 tab; oral Once A Day Every Other Day  0.5 mcg; oral,Once A Day Every Other Day) 45 capsule 0   Cholecalciferol (VITAMIN D3) 50 MCG (2000 UT) capsule Take 2,000 Units by mouth daily.     citalopram  (CELEXA ) 20 MG tablet Take 1 tablet (20 mg total) by mouth daily. 30 tablet 0   Cyanocobalamin  (VITAMIN B-12 PO) Take 2,500-5,000 mcg by mouth every other day.     diclofenac Sodium (VOLTAREN) 1 % GEL Apply 2 g topically daily as needed (Pain). 2 mg;  topical Special Instructions: apply to bilateral knees for pain management     Flaxseed, Linseed, (FLAX SEED OIL) 1000 MG CAPS Take 1,000 mg by mouth daily.     flecainide  (TAMBOCOR ) 100 MG tablet Take 1 tablet (100 mg total) by mouth 2 (two) times daily. 60 tablet 0   furosemide  (LASIX ) 80 MG tablet Take 2 tablets (160 mg total) by mouth daily. (Patient taking differently: Take 80-160 mg by mouth See admin instructions. 160 mg every morning, an additional 80 mg at NOON if needed for swelling) 60 tablet 0   gabapentin  (NEURONTIN ) 100 MG capsule TAKE 1 CAPSULE (100 MG TOTAL) BY MOUTH THREE TIMES DAILY. (Patient taking differently: Take 100 mg by mouth at bedtime.) 90 capsule 5   glipiZIDE (GLUCOTROL XL) 10 MG 24 hr tablet Take 10 mg by mouth daily with breakfast.     HYDROcodone -acetaminophen  (NORCO/VICODIN) 5-325 MG tablet Take 1 tablet by mouth 3 (three) times daily as needed.     levothyroxine  (SYNTHROID ) 75 MCG tablet Take 1 tablet (75 mcg total) by mouth daily before breakfast. 30 tablet 0   metoprolol  succinate (TOPROL -XL) 25 MG 24 hr tablet TAKE 1 TABLET (25 MG TOTAL) BY MOUTH DAILY. 90 tablet 1   potassium chloride  SA (KLOR-CON  M) 20 MEQ tablet Take 20 mEq by mouth 2 (two) times a week.     warfarin (COUMADIN ) 5 MG tablet Take 2.5-5 mg  by mouth See admin instructions. Monday Wednesday Friday and Saturday 5mg  daily Tuesday Thursday Sunday 2.5 mg     No current facility-administered medications for this visit.     Past Medical History:  Diagnosis Date   A-fib [facility])    Arthritis    spinal   Chronic kidney disease    Colon polyps    CVA (cerebral vascular accident) (HCC)    left sided weakness. Has 40% weakness of left arm   Depression    DM (diabetes mellitus) (HCC)    GERD (gastroesophageal reflux disease)    HTN (hypertension)    Hypercholesterolemia    Hypothyroidism    Pancreatitis    biliary   S/P colonoscopy 07/2004   Dr. Obie: per e-chart report: diverticulosis, path  adenomatous polyps   S/P endoscopy 07/2007   multiple 3-4 mmsessile gastric polyps, benign, singl antral erosions, negative H.pylori, reactive gastropathy ? 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   Past Surgical History:  Procedure Laterality Date   CATARACT EXTRACTION W/PHACO Left 04/07/2021   Procedure: CATARACT EXTRACTION PHACO AND INTRAOCULAR LENS PLACEMENT LEFT EYE;  Surgeon: Harrie Agent, MD;  Location: [facility];  Service: Ophthalmology;  Laterality: Left;  left CDE=11.11   CATARACT EXTRACTION W/PHACO Right 04/21/2021   Procedure: CATARACT EXTRACTION PHACO AND INTRAOCULAR LENS PLACEMENT RIGHT EYE;  Surgeon: Harrie Agent, MD;  Location: [facility];  Service: Ophthalmology;  Laterality: Right;  right CDE=10.12   CHOLECYSTECTOMY     COLONOSCOPY  03/10/2011   Procedure: COLONOSCOPY;  Surgeon: Lamar CHRISTELLA Hollingshead, MD;  Location[facility];  Service: Endoscopy;  Laterality: N/A;  12:45   COLONOSCOPY WITH PROPOFOL  N/A 07/22/2020   Procedure: COLONOSCOPY WITH PROPOFOL ;  Surgeon: Hollingshead Lamar CHRISTELLA, MD;  [facility];  Service: Endoscopy;  Laterality: N/A;  10:30am   HIP PINNING,CANNULATED Right 09/20/2021   Procedure: CANNULATED HIP PINNING;  Surgeon: Margrette Taft BRAVO, MD;  Location: [facility];  Service: Orthopedics;  Laterality: Right;   PACEMAKER INSERTION  02/05/2012   SJM Accent DR RF implanted by Dr Kelsie for symptomatic bradycardia   PERMANENT PACEMAKER INSERTION N/A 02/05/2012   Procedure: PERMANENT PACEMAKER INSERTION;  Surgeon: Agent Kelsie, MD;  Location: [facility] CATH LAB;  Service: Cardiovascular;  Laterality: N/A;   POLYPECTOMY  07/22/2020   Procedure: POLYPECTOMY;  Surgeon: Hollingshead Lamar CHRISTELLA, MD;  [facility];  Service: Endoscopy;;   PPM GENERATOR CHANGEOUT N/A 07/06/2022   Procedure: PPM GENERATOR CHANGEOUT;  Surgeon: Waddell Danelle ORN, MD;  Location: [facility]  INVASIVE CV LAB;  Service: Cardiovascular;  Laterality: N/A;   SHOULDER SURGERY  2008   left shoulder replacement due to fracture    SKIN CANCER EXCISION Left    Skin cancer on left side of face.     Family History  Problem Relation Age of Onset   Heart disease Other    Arthritis Other    Lung disease Other    Diabetes Other    Kidney disease Other    Cancer Mother    Heart disease Father    Colon cancer Neg Hx      Social History   Socioeconomic History   Marital status: Legally Separated    Spouse name: Not on file   Number of children: Not on file   Years of education: Not on file   Highest education  level: Not on file  Occupational History   Not on file  Tobacco Use   Smoking status: Never   Smokeless tobacco: Never   Tobacco comments:    Never smoked 09/22/23  Vaping Use   Vaping status: Never Used  Substance and Sexual Activity   Alcohol use: No   Drug use: No   Sexual activity: Not Currently  Other Topics Concern   Not on file  Social History Narrative   Not on file   Social Drivers of Health   Financial Resource Strain: Not on file  Food Insecurity: Not on file  Transportation Needs: Not on file  Physical Activity: Not on file  Stress: Not on file  Social Connections: Not on file  Intimate Partner Violence: Not on file     BP (!) 114/58 (BP Location: Left Arm, Patient Position: Sitting, Cuff Size: Normal)   Pulse 62   Ht 5' 5 (1.651 m)   Wt 144 lb 3.2 oz (65.4 kg)   SpO2 96%   BMI 24.00 kg/m   Physical Exam:  Well appearing 83 yo woman, NAD HEENT: Unremarkable Neck:  No JVD, no thyromegally Lymphatics:  No adenopathy Back:  No CVA tenderness Lungs:  Clear with no wheezes HEART:  Regular rate rhythm, no murmurs, no rubs, no clicks Abd:  soft, positive bowel sounds, no organomegally, no rebound, no guarding Ext:  2 plus pulses, 2+ edema, with venous insufficiency, no cyanosis, no clubbing Skin:  No rashes no nodules Neuro:  CN II  through XII intact, motor grossly intact  EKG - AV paced  DEVICE  Normal device function.  See PaceArt for details.   Assess/Plan:  sinus node dysfunction - she is asymptomatic s/p PPM insertion. PAF - she will continue flecainide  and coumadin  Venous insufficiency - she is encouraged to avoid salty foods and keep her legs elevated. HTN - her bp is fairly well controlled. No change in meds. Fatigue and sob - her symptoms have worsened since starting the toprol . I have asked her to wean off and we will reassess. If no better, a 2D echo would be her next test. In addition, I note that she is ventricular pacing 80% compared to a prior 30%. If her pacing percentage does not improve, (go down) then we will reprogram her device by increasing her AV delay to the maximal level.    Danelle Vanilla [place],MD

## 2023-11-18 NOTE — Patient Instructions (Addendum)
 Medication Instructions:  Your physician recommends that you continue on your current medications as directed. Please refer to the Current Medication list given to you today.  Decrease Toprol  XL to 1/2 tablet for one week then Stop   *If you need a refill on your cardiac medications before your next appointment, please call your pharmacy*  Lab Work: NONE   If you have labs (blood work) drawn today and your tests are completely normal, you will receive your results only by: MyChart Message (if you have MyChart) OR A paper copy in the mail If you have any lab test that is abnormal or we need to change your treatment, we will call you to review the results.  Testing/Procedures: NONE   Follow-Up: At [facility], you and your health needs are our priority.  As part of our continuing mission to provide you with exceptional heart care, our providers are all part of one team.  This team includes your primary Cardiologist (physician) and Advanced Practice Providers or APPs (Physician Assistants and Nurse Practitioners) who all work together to provide you with the care you need, when you need it.  Your next appointment:   1 year(s)  Provider:   Danelle Birmingham, MD    We recommend signing up for the patient portal called MyChart.  Sign up information is provided on this After Visit Summary.  MyChart is used to connect with patients for Virtual Visits (Telemedicine).  Patients are able to view lab/test results, encounter notes, upcoming appointments, etc.  Non-urgent messages can be sent to your provider as well.   To learn more about what you can do with MyChart, go to [email or web address].   Other Instructions Thank you for choosing [facility]!

## 2023-11-30 ENCOUNTER — Encounter: Payer: Self-pay | Admitting: Internal Medicine

## 2023-12-08 DIAGNOSIS — N1831 Chronic kidney disease, stage 3a: Secondary | ICD-10-CM | POA: Diagnosis not present

## 2023-12-09 ENCOUNTER — Other Ambulatory Visit: Payer: Self-pay | Admitting: Orthopedic Surgery

## 2023-12-13 DIAGNOSIS — R600 Localized edema: Secondary | ICD-10-CM | POA: Diagnosis not present

## 2023-12-13 DIAGNOSIS — E876 Hypokalemia: Secondary | ICD-10-CM | POA: Diagnosis not present

## 2023-12-13 DIAGNOSIS — E1122 Type 2 diabetes mellitus with diabetic chronic kidney disease: Secondary | ICD-10-CM | POA: Diagnosis not present

## 2023-12-13 DIAGNOSIS — M4802 Spinal stenosis, cervical region: Secondary | ICD-10-CM | POA: Diagnosis not present

## 2023-12-13 DIAGNOSIS — I4891 Unspecified atrial fibrillation: Secondary | ICD-10-CM | POA: Diagnosis not present

## 2023-12-13 DIAGNOSIS — D6869 Other thrombophilia: Secondary | ICD-10-CM | POA: Diagnosis not present

## 2023-12-13 DIAGNOSIS — G894 Chronic pain syndrome: Secondary | ICD-10-CM | POA: Diagnosis not present

## 2023-12-13 DIAGNOSIS — G609 Hereditary and idiopathic neuropathy, unspecified: Secondary | ICD-10-CM | POA: Diagnosis not present

## 2023-12-13 DIAGNOSIS — N1831 Chronic kidney disease, stage 3a: Secondary | ICD-10-CM | POA: Diagnosis not present

## 2023-12-13 DIAGNOSIS — F331 Major depressive disorder, recurrent, moderate: Secondary | ICD-10-CM | POA: Diagnosis not present

## 2023-12-13 DIAGNOSIS — I10 Essential (primary) hypertension: Secondary | ICD-10-CM | POA: Diagnosis not present

## 2023-12-13 DIAGNOSIS — R7989 Other specified abnormal findings of blood chemistry: Secondary | ICD-10-CM | POA: Diagnosis not present

## 2023-12-13 DIAGNOSIS — I4821 Permanent atrial fibrillation: Secondary | ICD-10-CM | POA: Diagnosis not present

## 2023-12-22 ENCOUNTER — Ambulatory Visit (HOSPITAL_COMMUNITY)

## 2023-12-22 NOTE — Therapy (Incomplete)
 OUTPATIENT PHYSICAL THERAPY CERVICAL EVALUATION   Patient Name: [name] MRN: [id number] DOB:[birth year], 83 y.o., female Today's Date: 12/22/2023  END OF SESSION:   Past Medical History:  Diagnosis Date   A-fib [facility])    Arthritis    spinal   Chronic kidney disease    Colon polyps    CVA (cerebral vascular accident) (HCC)    left sided weakness. Has 40% weakness of left arm   Depression    DM (diabetes mellitus) (HCC)    GERD (gastroesophageal reflux disease)    HTN (hypertension)    Hypercholesterolemia    Hypothyroidism    Pancreatitis    biliary   S/P colonoscopy 07/2004   Dr. Obie: per e-chart report: diverticulosis, path adenomatous polyps   S/P endoscopy 07/2007   multiple 3-4 mmsessile gastric polyps, benign, singl antral erosions, negative H.pylori, reactive gastropathy ? 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   Past Surgical History:  Procedure Laterality Date   CATARACT EXTRACTION W/PHACO Left 04/07/2021   Procedure: CATARACT EXTRACTION PHACO AND INTRAOCULAR LENS PLACEMENT LEFT EYE;  Surgeon: Harrie Agent, MD;  Location: [facility];  Service: Ophthalmology;  Laterality: Left;  left CDE=11.11   CATARACT EXTRACTION W/PHACO Right 04/21/2021   Procedure: CATARACT EXTRACTION PHACO AND INTRAOCULAR LENS PLACEMENT RIGHT EYE;  Surgeon: Harrie Agent, MD;  Location: [facility];  Service: Ophthalmology;  Laterality: Right;  right CDE=10.12   CHOLECYSTECTOMY     COLONOSCOPY  03/10/2011   Procedure: COLONOSCOPY;  Surgeon: Lamar CHRISTELLA Hollingshead, MD;  Location: [facility];  Service: Endoscopy;  Laterality: N/A;  12:45   COLONOSCOPY WITH PROPOFOL  N/A 07/22/2020   Procedure: COLONOSCOPY WITH PROPOFOL ;  Surgeon: Hollingshead Lamar CHRISTELLA, MD;  Location: [facility];  Service: Endoscopy;  Laterality: N/A;  10:30am   HIP PINNING,CANNULATED Right 09/20/2021   Procedure: CANNULATED HIP PINNING;  Surgeon: Margrette Taft BRAVO, MD;  Location: [facility];   Service: Orthopedics;  Laterality: Right;   PACEMAKER INSERTION  02/05/2012   SJM Accent DR RF implanted by Dr Kelsie for symptomatic bradycardia   PERMANENT PACEMAKER INSERTION N/A 02/05/2012   Procedure: PERMANENT PACEMAKER INSERTION;  Surgeon: Agent Kelsie, MD;  Location: [facility] CATH LAB;  Service: Cardiovascular;  Laterality: N/A;   POLYPECTOMY  07/22/2020   Procedure: POLYPECTOMY;  Surgeon: Hollingshead Lamar CHRISTELLA, MD;  Location: [facility];  Service: Endoscopy;;   PPM GENERATOR CHANGEOUT N/A 07/06/2022   Procedure: PPM GENERATOR CHANGEOUT;  Surgeon: Waddell Danelle ORN, MD;  Location: [facility] INVASIVE CV LAB;  Service: Cardiovascular;  Laterality: N/A;   SHOULDER SURGERY  2008   left shoulder replacement due to fracture    SKIN CANCER EXCISION Left    Skin cancer on left side of face.   Patient Active Problem List   Diagnosis Date Noted   Hypercoagulable state due to persistent atrial fibrillation (HCC) 07/23/2023   Thoracic back pain 07/16/2023   Closed fracture of proximal end of right humerus 03/16/2023   Gout 03/16/2023   Idiopathic peripheral neuropathy 03/16/2023   Osteopenia 03/16/2023   Closed 3-part fracture of proximal humerus with routine healing 02/10/2023   Postoperative anemia 09/25/2021   Secondary hyperparathyroidism (HCC) 09/24/2021   CKD stage 3 due to type 2 diabetes mellitus (HCC) 09/24/2021   Major depression, recurrent, chronic (HCC) 09/24/2021   Chronic gout due to renal impairment 09/24/2021   Bilateral lower extremity edema 09/24/2021   Polyneuropathy due to type 2 diabetes mellitus (HCC) 09/24/2021  Hyperlipidemia associated with type 2 diabetes mellitus (HCC) 09/24/2021   Chronic constipation 09/24/2021   Thrombocytopenia (HCC) 09/24/2021   Encounter for monitoring flecainide  therapy 09/24/2021   Closed fracture of neck of right femur (HCC) 09/19/2021   Neck pain 06/12/2021   Unspecified fall, initial encounter 03/28/2021   Chronic pain syndrome 01/20/2021    Hypokalemia 04/29/2020   Pacemaker-St.Jude 02/09/2012   Atrial fibrillation (HCC) 02/04/2012   Type 2 diabetes mellitus (HCC) 04/25/2007   Essential (primary) hypertension 04/25/2007   Adult hypothyroidism 04/25/2007    PCP: Shona Norleen PEDLAR, MD   REFERRING PROVIDER: Margrette Taft BRAVO, MD  REFERRING DIAG: M54.2 (ICD-10-CM) - Neck pain M48.02 (ICD-10-CM) - Spinal stenosis in cervical region  THERAPY DIAG:  No diagnosis found.  Rationale for Evaluation and Treatment: Rehabilitation  ONSET DATE: ***  SUBJECTIVE:                                                                                                                                                                                                         SUBJECTIVE STATEMENT: *** Hand dominance: {MISC; OT HAND DOMINANCE:[id number]}  PERTINENT HISTORY:  ***  PAIN:  Are you having pain? {OPRCPAIN:27236}  PRECAUTIONS: {Therapy precautions:24002}  RED FLAGS: {PT Red Flags:29287}     WEIGHT BEARING RESTRICTIONS: {Yes ***/No:24003}  FALLS:  Has patient fallen in last 6 months? {fallsyesno:27318}  LIVING ENVIRONMENT: Lives with: {OPRC lives with:25569::lives with their family} Lives in: {Lives in:25570} Stairs: {opstairs:27293} Has following equipment at home: {Assistive devices:23999}  OCCUPATION: ***  PLOF: {PLOF:24004}  PATIENT GOALS: ***  NEXT MD VISIT: ***  OBJECTIVE:  Note: Objective measures were completed at Evaluation unless otherwise noted.  DIAGNOSTIC FINDINGS:  Cervical spine imaging   Numbness and tingling of the left upper extremity   X-rays show degenerative changes in the cervical spine spondylosis disc space narrowing   Impression cervical spondylosis no acute fracture   We also C4 on 5 subluxation 2 to 3 mm chronic  PATIENT SURVEYS:  NDI: {:PHR,OPRCNDI}  COGNITION: Overall cognitive status: {cognition:24006}  SENSATION: {sensation:27233}  POSTURE:  {posture:25561}  PALPATION: ***   CERVICAL ROM:   {AROM/PROM:27142} ROM A/PROM (deg) eval  Flexion   Extension   Right lateral flexion   Left lateral flexion   Right rotation   Left rotation    (Blank rows = not tested)  UPPER EXTREMITY ROM:  {AROM/PROM:27142} ROM Right eval Left eval  Shoulder flexion    Shoulder extension    Shoulder abduction    Shoulder adduction    Shoulder extension    Shoulder  internal rotation    Shoulder external rotation    Elbow flexion    Elbow extension    Wrist flexion    Wrist extension    Wrist ulnar deviation    Wrist radial deviation    Wrist pronation    Wrist supination     (Blank rows = not tested)  UPPER EXTREMITY MMT:  MMT Right eval Left eval  Shoulder flexion    Shoulder extension    Shoulder abduction    Shoulder adduction    Shoulder extension    Shoulder internal rotation    Shoulder external rotation    Middle trapezius    Lower trapezius    Elbow flexion    Elbow extension    Wrist flexion    Wrist extension    Wrist ulnar deviation    Wrist radial deviation    Wrist pronation    Wrist supination    Grip strength     (Blank rows = not tested)  CERVICAL SPECIAL TESTS:  Spurling's test: {pos/neg:25243} and Distraction test: {pos/neg:25243}  FUNCTIONAL TESTS:  {Functional tests:24029}  TREATMENT DATE:  12/22/2023  Vitals: BP: HR: O2 sat: Evaluation: -ROM measured, Strength assessed, HEP prescribed, pt educated on prognosis, findings, and importance of HEP compliance if given.  Manual Therapy: -CPA of Lumbar Spinal segments L3-L5, grade II-III mobilizations -STM of Lumbar Paraspinal musculature  Therapeutic Exercise: -Supine bridges 2 sets of 10 reps, 3 second holds, symptomatic, pt cued for max hip extension -Standing 3 way hip 1 sets 10 reps, bilaterally, pt cued for upright trunk and maintaining of neutral spine -Lateral stepping 3 laps 20 feet per lap, second 2 with RTB around ankles,  pt cued for upright posture -Forward lunges, 1 set of 5 reps better performance going into RLE, pt cued for core activation and upright posture                                                                                                                                     PATIENT EDUCATION:  Education details: Pt was educated on findings of PT evaluation, prognosis, frequency of therapy visits and rationale, attendance policy, and HEP if given.   Person educated: {Person educated:25204} Education method: {Education Method:25205} Education comprehension: {Education Comprehension:25206}  HOME EXERCISE PROGRAM: ***  ASSESSMENT:  CLINICAL IMPRESSION: Patient is a 83 y.o. female who was seen today for physical therapy evaluation and treatment for M54.2 (ICD-10-CM) - Neck pain M48.02 (ICD-10-CM) - Spinal stenosis in cervical region.   Patient demonstrates decreased cervical spine ROM, increased pain in neck, and increased tension in bilateral upper trapezius and paraspinals of cervical spine. Patient also suffering from increased frequency and intensity of headaches for the past 2 months. Patient also demonstrates tenderness to upper thoracic spinal segments to about T3 vertebrae. Patient requires moderate verbal cuing for relaxation of cervical spine musculature with difficulty. Patient would benefit from skilled physical therapy for decreased  headache frequency and intensity, increased strength in paraspinal and other postural musculature, and improved posture for improved ability to work without symptom reproduction, return to higher level of function with ADLs, and progress towards therapy goals.    OBJECTIVE IMPAIRMENTS: {opptimpairments:25111}.   ACTIVITY LIMITATIONS: {activitylimitations:27494}  PARTICIPATION LIMITATIONS: {participationrestrictions:25113}  PERSONAL FACTORS: {Personal factors:25162} are also affecting patient's functional outcome.   REHAB POTENTIAL:  {rehabpotential:25112}  CLINICAL DECISION MAKING: {clinical decision making:25114}  EVALUATION COMPLEXITY: {Evaluation complexity:25115}   GOALS: Goals reviewed with patient? {yes/no:20286}  SHORT TERM GOALS: Target date: ***  Pt will be independent with HEP in order to demonstrate participation in Physical Therapy POC.  Baseline: Goal status: {GOALSTATUS:25110}  2.  Pt will report ***/10 pain with cervical mobility in order to demonstrate improved pain with ADLs.  Baseline:  Goal status: {GOALSTATUS:25110}  LONG TERM GOALS: Target date: ***  Pt will report decreased cervicogenic caused headaches to less than *** per *** in order to demonstrate improved quality of life.  Baseline: see objective.  Goal status: {GOALSTATUS:25110}  2.  Pt will improve cervical ROM (flex/ext/lateral flexion/rotation) by *** in order to demonstrate improved functional ambulatory capacity in community setting.  Baseline: see objective.  Goal status: {GOALSTATUS:25110}  3.  Pt will improve NDI score by at least 11.75 points in order to demonstrate decreased pain with functional goals and outcomes. Baseline: see objective.  Goal status: {GOALSTATUS:25110}  4.  Pt will report ***/10 pain with cervical mobility in order to demonstrate reduced pain with ADLs that require use of cervical spine musculature (driving, washing hair, reaching to elevated cabinet).  Baseline: see objective.  Goal status: {GOALSTATUS:25110}     PLAN:  PT FREQUENCY: {rehab frequency:25116}  PT DURATION: {rehab duration:25117}  PLANNED INTERVENTIONS: {rehab planned interventions:25118::97110-Therapeutic exercises,97530- Therapeutic [id number]- Neuromuscular re-education,97535- Self Rjmz,02859- Manual therapy}  PLAN FOR NEXT SESSION: ***   Lang Ada, PT, DPT [facility] Office: [phone] 7:52 AM, 12/22/23

## 2023-12-24 ENCOUNTER — Encounter (HOSPITAL_COMMUNITY): Payer: Self-pay

## 2023-12-24 NOTE — Therapy (Signed)
 [facility] [address] [place], [place], [address] Phone: [phone]   Fax:  [phone]  Patient Details  Name: [name] MRN: [id number] Date of Birth: 07-28-1940 Referring Provider:  No ref. provider found  Encounter Date: 12/24/2023   Pt called concerning her missed appointment earlier this week. Pt left instructions to call office should she still be in need of therapy services.   Lang Ada, PT, DPT [facility] Office: [phone] 9:48 AM, 12/24/23   [facility] at [facility] [address] [place], [place], [address] Phone: [phone]   Fax:  [phone]

## 2023-12-27 DIAGNOSIS — N183 Chronic kidney disease, stage 3 unspecified: Secondary | ICD-10-CM | POA: Diagnosis not present

## 2023-12-27 DIAGNOSIS — N2581 Secondary hyperparathyroidism of renal origin: Secondary | ICD-10-CM | POA: Diagnosis not present

## 2023-12-27 DIAGNOSIS — I129 Hypertensive chronic kidney disease with stage 1 through stage 4 chronic kidney disease, or unspecified chronic kidney disease: Secondary | ICD-10-CM | POA: Diagnosis not present

## 2023-12-27 DIAGNOSIS — E1122 Type 2 diabetes mellitus with diabetic chronic kidney disease: Secondary | ICD-10-CM | POA: Diagnosis not present

## 2023-12-27 DIAGNOSIS — E66811 Obesity, class 1: Secondary | ICD-10-CM | POA: Diagnosis not present

## 2023-12-27 DIAGNOSIS — M109 Gout, unspecified: Secondary | ICD-10-CM | POA: Diagnosis not present

## 2023-12-27 DIAGNOSIS — E782 Mixed hyperlipidemia: Secondary | ICD-10-CM | POA: Diagnosis not present

## 2023-12-27 DIAGNOSIS — I480 Paroxysmal atrial fibrillation: Secondary | ICD-10-CM | POA: Diagnosis not present

## 2023-12-28 ENCOUNTER — Telehealth: Payer: Self-pay | Admitting: Orthopedic Surgery

## 2023-12-28 NOTE — Telephone Encounter (Signed)
 FYI only patient declined scheduling of physical therapy, referral made in May multiple attempts made to schedule  Closed referral, patient declines

## 2024-01-05 ENCOUNTER — Ambulatory Visit (INDEPENDENT_AMBULATORY_CARE_PROVIDER_SITE_OTHER): Payer: Medicare HMO

## 2024-01-05 DIAGNOSIS — I495 Sick sinus syndrome: Secondary | ICD-10-CM

## 2024-01-06 ENCOUNTER — Ambulatory Visit: Payer: Self-pay | Admitting: Internal Medicine

## 2024-01-06 LAB — CUP PACEART REMOTE DEVICE CHECK
Battery Remaining Longevity: 97 mo
Battery Remaining Percentage: 87 %
Battery Voltage: 2.99 V
Brady Statistic AP VP Percent: 11 %
Brady Statistic AP VS Percent: 7.4 %
Brady Statistic AS VP Percent: 68 %
Brady Statistic AS VS Percent: 12 %
Brady Statistic RA Percent Paced: 9.8 %
Brady Statistic RV Percent Paced: 58 %
Date Time Interrogation Session: 20250813103453
Implantable Lead Connection Status: 753985
Implantable Lead Connection Status: 753985
Implantable Lead Implant Date: 20130913
Implantable Lead Implant Date: 20130913
Implantable Lead Location: 753859
Implantable Lead Location: 753860
Implantable Lead Model: 1948
Implantable Pulse Generator Implant Date: 20240212
Lead Channel Impedance Value: 530 Ohm
Lead Channel Impedance Value: 640 Ohm
Lead Channel Pacing Threshold Amplitude: 0.75 V
Lead Channel Pacing Threshold Amplitude: 1 V
Lead Channel Pacing Threshold Pulse Width: 0.5 ms
Lead Channel Pacing Threshold Pulse Width: 0.5 ms
Lead Channel Sensing Intrinsic Amplitude: 12 mV
Lead Channel Sensing Intrinsic Amplitude: 2.7 mV
Lead Channel Setting Pacing Amplitude: 2 V
Lead Channel Setting Pacing Amplitude: 2.5 V
Lead Channel Setting Pacing Pulse Width: 0.5 ms
Lead Channel Setting Sensing Sensitivity: 2 mV
Pulse Gen Model: 2272
Pulse Gen Serial Number: 8153935

## 2024-02-17 NOTE — Progress Notes (Signed)
 Remote PPM Transmission

## 2024-02-29 LAB — LAB REPORT - SCANNED
A1c: 6.2
Albumin, Urine POC: 4.7
Creatinine, POC: 62.8 mg/dL
EGFR: 33
Microalb Creat Ratio: 7

## 2024-03-10 DIAGNOSIS — M4802 Spinal stenosis, cervical region: Secondary | ICD-10-CM | POA: Diagnosis not present

## 2024-03-10 DIAGNOSIS — Z0000 Encounter for general adult medical examination without abnormal findings: Secondary | ICD-10-CM | POA: Diagnosis not present

## 2024-03-10 DIAGNOSIS — N1831 Chronic kidney disease, stage 3a: Secondary | ICD-10-CM | POA: Diagnosis not present

## 2024-03-10 DIAGNOSIS — I4891 Unspecified atrial fibrillation: Secondary | ICD-10-CM | POA: Diagnosis not present

## 2024-03-10 DIAGNOSIS — G894 Chronic pain syndrome: Secondary | ICD-10-CM | POA: Diagnosis not present

## 2024-03-10 DIAGNOSIS — R7989 Other specified abnormal findings of blood chemistry: Secondary | ICD-10-CM | POA: Diagnosis not present

## 2024-03-10 DIAGNOSIS — F331 Major depressive disorder, recurrent, moderate: Secondary | ICD-10-CM | POA: Diagnosis not present

## 2024-03-10 DIAGNOSIS — R600 Localized edema: Secondary | ICD-10-CM | POA: Diagnosis not present

## 2024-03-10 DIAGNOSIS — I10 Essential (primary) hypertension: Secondary | ICD-10-CM | POA: Diagnosis not present

## 2024-03-10 DIAGNOSIS — G609 Hereditary and idiopathic neuropathy, unspecified: Secondary | ICD-10-CM | POA: Diagnosis not present

## 2024-03-10 DIAGNOSIS — J069 Acute upper respiratory infection, unspecified: Secondary | ICD-10-CM | POA: Diagnosis not present

## 2024-03-10 DIAGNOSIS — E876 Hypokalemia: Secondary | ICD-10-CM | POA: Diagnosis not present

## 2024-03-20 ENCOUNTER — Other Ambulatory Visit (HOSPITAL_COMMUNITY): Payer: Self-pay | Admitting: Internal Medicine

## 2024-03-20 DIAGNOSIS — M8580 Other specified disorders of bone density and structure, unspecified site: Secondary | ICD-10-CM

## 2024-03-27 ENCOUNTER — Encounter: Payer: Self-pay | Admitting: Radiology

## 2024-04-03 LAB — CUP PACEART REMOTE DEVICE CHECK
Battery Remaining Longevity: 92 mo
Battery Remaining Percentage: 84 %
Battery Voltage: 2.99 V
Brady Statistic AP VP Percent: 12 %
Brady Statistic AP VS Percent: 15 %
Brady Statistic AS VP Percent: 54 %
Brady Statistic AS VS Percent: 18 %
Brady Statistic RA Percent Paced: 17 %
Brady Statistic RV Percent Paced: 56 %
Date Time Interrogation Session: 20251110101239
Implantable Lead Connection Status: 753985
Implantable Lead Connection Status: 753985
Implantable Lead Implant Date: 20130913
Implantable Lead Implant Date: 20130913
Implantable Lead Location: 753859
Implantable Lead Location: 753860
Implantable Lead Model: 1948
Implantable Pulse Generator Implant Date: 20240212
Lead Channel Impedance Value: 480 Ohm
Lead Channel Impedance Value: 610 Ohm
Lead Channel Pacing Threshold Amplitude: 0.75 V
Lead Channel Pacing Threshold Amplitude: 1 V
Lead Channel Pacing Threshold Pulse Width: 0.5 ms
Lead Channel Pacing Threshold Pulse Width: 0.5 ms
Lead Channel Sensing Intrinsic Amplitude: 12 mV
Lead Channel Sensing Intrinsic Amplitude: 2.9 mV
Lead Channel Setting Pacing Amplitude: 2 V
Lead Channel Setting Pacing Amplitude: 2.5 V
Lead Channel Setting Pacing Pulse Width: 0.5 ms
Lead Channel Setting Sensing Sensitivity: 2 mV
Pulse Gen Model: 2272
Pulse Gen Serial Number: 8153935

## 2024-04-04 ENCOUNTER — Other Ambulatory Visit (HOSPITAL_COMMUNITY)

## 2024-04-05 ENCOUNTER — Ambulatory Visit (INDEPENDENT_AMBULATORY_CARE_PROVIDER_SITE_OTHER): Payer: Medicare HMO

## 2024-04-05 DIAGNOSIS — I495 Sick sinus syndrome: Secondary | ICD-10-CM

## 2024-04-09 ENCOUNTER — Ambulatory Visit: Payer: Self-pay | Admitting: Internal Medicine

## 2024-04-10 NOTE — Progress Notes (Signed)
 Remote PPM Transmission

## 2024-04-11 ENCOUNTER — Ambulatory Visit (HOSPITAL_COMMUNITY)
Admission: RE | Admit: 2024-04-11 | Discharge: 2024-04-11 | Disposition: A | Discharge status: Home / Self Care | Source: Ambulatory Visit | Attending: Internal Medicine | Admitting: Internal Medicine

## 2024-04-11 DIAGNOSIS — M8589 Other specified disorders of bone density and structure, multiple sites: Secondary | ICD-10-CM | POA: Insufficient documentation

## 2024-04-11 DIAGNOSIS — M8580 Other specified disorders of bone density and structure, unspecified site: Secondary | ICD-10-CM | POA: Diagnosis not present

## 2024-04-24 NOTE — Telephone Encounter (Signed)
 Call x1; lvmtcb to schedule patient with [facility] on [place]/EP APP in [place] to evaluate unipolar threshold/impedence to see if we can help improve battery longevity per [place] on patients recent remote transmission.

## 2024-04-24 NOTE — Telephone Encounter (Signed)
 Patient returned my call. She is scheduled with Aniceto, NP tomorrow 12/2.

## 2024-04-25 ENCOUNTER — Ambulatory Visit: Admitting: Pulmonary Disease

## 2024-05-08 DIAGNOSIS — H01004 Unspecified blepharitis left upper eyelid: Secondary | ICD-10-CM | POA: Diagnosis not present

## 2024-05-08 DIAGNOSIS — H18513 Endothelial corneal dystrophy, bilateral: Secondary | ICD-10-CM | POA: Diagnosis not present

## 2024-05-08 DIAGNOSIS — H01002 Unspecified blepharitis right lower eyelid: Secondary | ICD-10-CM | POA: Diagnosis not present

## 2024-05-08 DIAGNOSIS — H01001 Unspecified blepharitis right upper eyelid: Secondary | ICD-10-CM | POA: Diagnosis not present

## 2024-05-14 NOTE — Progress Notes (Deleted)
" °  Cardiology Office Note:  .   Date:  05/14/2024  ID:  [name], DOB [birth year], MRN [id number] PCP: Shona Norleen PEDLAR, MD  [facility] Providers Cardiologist:  None Electrophysiologist:  Dr Derick > Danelle Birmingham, MD {  History of Present Illness: .   [name] is a 83 y.o. female w/PMHx of   stroke, DM, HTN, HLD, hypothyroidism AFib Symptomatic brady > PPM  Seeing Dr. Kelsie > Dr. Birmingham, EP/AFib clinic APPs  Most recently Dr. Birmingham 10/29/23, she was s/p GRN change ~ 90mo and doing well, still working Noted c/o SOB and fatigue since starting metoprolol , her VP also up  Maintained on flecainide  > metoprolol  weaned off Discussed her VP had gone from 30 > 80% If not better on reassessment, would push out AV delay and check echo  Nov remote Dr. Birmingham recommended followup to evaluate unipolar threshold/impedence to see if we can help improve battery longevity Initially rec Dr. Almetta in [place] > ended up with an appt with Daphne that was cancelled  Today's visit is scheduled to evaluate lead impedance, thresholds ROS:   There is a note on her device: Bipolar impedance 1500/threshold 3.2/0.8 Programmed unipolar pacing Battery OK ~ 7+ years AF burden 15% Mode switch 37%  *** symptoms *** flecainide  *** warfarin, bleeding, who manages *** echo?  Device information SJM dual chamber PPM, implanted 02/05/12, gen change 07/06/22  Arrhythmia/AAD hx Flecainide  started ~ 2012   Studies Reviewed: SABRA    EKG done today and reviewed by myself:  ***  DEVICE interrogation done today and reviewed by myself *** Battery and lead measurements are good ***   02/05/12: TTE Study Conclusions - Left ventricle: The cavity size was normal. Wall thickness   was normal. Systolic function was normal. The estimated   ejection fraction was in the range of 55% to 60%. Wall   motion was normal; there were no regional wall motion   abnormalities. - Aortic valve: Mild  regurgitation. - Mitral valve: Calcified annulus. Mildly thickened leaflets   . Mild regurgitation. - Left atrium: The atrium was mildly dilated. - Pulmonary arteries: Systolic pressure was mildly to   moderately increased. Impressions:   Risk Assessment/Calculations:    Physical Exam:   VS:  There were no vitals taken for this visit.   Wt Readings from Last 3 Encounters:  11/18/23 144 lb 3.2 oz (65.4 kg)  09/27/23 158 lb (71.7 kg)  09/22/23 158 lb 3.2 oz (71.8 kg)    GEN: Well nourished, well developed in no acute distress NECK: No JVD; No carotid bruits CARDIAC: ***RRR, no murmurs, rubs, gallops RESPIRATORY:  *** CTA b/l without rales, wheezing or rhonchi  ABDOMEN: Soft, non-tender, non-distended EXTREMITIES: *** No edema; No deformity   PPM site: *** is stable, no thinning, fluctuation, tethering  ASSESSMENT AND PLAN: .    PPM *** intact function *** no programming changes made  paroxysmal AFib CHA2DS2Vasc is 7, on warfarin, monitored and managed by *** Longstanding flecainide  Now off metoprolol  2/2 SOB, fatigue, and increased RV pacing burden *** % burden  HTN ***  Secondary hypercoagulable state 2/2 AFib     {Are you ordering a CV Procedure (e.g. stress test, cath, DCCV, TEE, etc)?   Press F2        :[id number]}     Dispo: ***  Signed, Charlies Macario Arthur, PA-C   "

## 2024-05-16 ENCOUNTER — Ambulatory Visit: Admitting: Physician Assistant

## 2024-05-22 ENCOUNTER — Telehealth: Payer: Self-pay | Admitting: Orthopedic Surgery

## 2024-05-22 NOTE — Telephone Encounter (Signed)
 Returned the pt's call, lvm for her to cb.

## 2024-05-24 ENCOUNTER — Other Ambulatory Visit (HOSPITAL_COMMUNITY): Payer: Self-pay | Admitting: Family Medicine

## 2024-05-24 ENCOUNTER — Ambulatory Visit (HOSPITAL_COMMUNITY)
Admission: RE | Admit: 2024-05-24 | Discharge: 2024-05-24 | Disposition: A | Discharge status: Home / Self Care | Source: Ambulatory Visit | Attending: Family Medicine | Admitting: Family Medicine

## 2024-05-24 DIAGNOSIS — M7989 Other specified soft tissue disorders: Secondary | ICD-10-CM | POA: Diagnosis present

## 2024-07-05 ENCOUNTER — Ambulatory Visit: Admitting: Physician Assistant

## 2024-10-04 ENCOUNTER — Ambulatory Visit

## 2025-01-03 ENCOUNTER — Ambulatory Visit

## 2025-04-04 ENCOUNTER — Ambulatory Visit

## 2025-07-04 ENCOUNTER — Ambulatory Visit

## 2025-10-03 ENCOUNTER — Ambulatory Visit
